# Patient Record
Sex: Female | Born: 1945
Health system: Southern US, Community
[De-identification: ages and names within clinical notes are randomized; demographics above are authoritative.]

## PROBLEM LIST (undated history)

## (undated) ENCOUNTER — Inpatient Hospital Stay: Admission: EM | Payer: Self-pay | Source: Home / Self Care

## (undated) DIAGNOSIS — T7840XA Allergy, unspecified, initial encounter: Secondary | ICD-10-CM

## (undated) DIAGNOSIS — Z86718 Personal history of other venous thrombosis and embolism: Secondary | ICD-10-CM

## (undated) DIAGNOSIS — D689 Coagulation defect, unspecified: Secondary | ICD-10-CM

## (undated) DIAGNOSIS — D6851 Activated protein C resistance: Secondary | ICD-10-CM

## (undated) DIAGNOSIS — R590 Localized enlarged lymph nodes: Secondary | ICD-10-CM

## (undated) DIAGNOSIS — I071 Rheumatic tricuspid insufficiency: Secondary | ICD-10-CM

## (undated) DIAGNOSIS — H269 Unspecified cataract: Secondary | ICD-10-CM

## (undated) DIAGNOSIS — N393 Stress incontinence (female) (male): Secondary | ICD-10-CM

## (undated) DIAGNOSIS — K635 Polyp of colon: Secondary | ICD-10-CM

## (undated) DIAGNOSIS — Z8701 Personal history of pneumonia (recurrent): Secondary | ICD-10-CM

## (undated) DIAGNOSIS — Z8781 Personal history of (healed) traumatic fracture: Secondary | ICD-10-CM

## (undated) DIAGNOSIS — R7303 Prediabetes: Secondary | ICD-10-CM

## (undated) DIAGNOSIS — R011 Cardiac murmur, unspecified: Secondary | ICD-10-CM

## (undated) DIAGNOSIS — K5790 Diverticulosis of intestine, part unspecified, without perforation or abscess without bleeding: Secondary | ICD-10-CM

## (undated) DIAGNOSIS — E785 Hyperlipidemia, unspecified: Secondary | ICD-10-CM

## (undated) DIAGNOSIS — E042 Nontoxic multinodular goiter: Secondary | ICD-10-CM

## (undated) DIAGNOSIS — E039 Hypothyroidism, unspecified: Secondary | ICD-10-CM

## (undated) DIAGNOSIS — K219 Gastro-esophageal reflux disease without esophagitis: Secondary | ICD-10-CM

## (undated) DIAGNOSIS — K5909 Other constipation: Secondary | ICD-10-CM

## (undated) DIAGNOSIS — R0600 Dyspnea, unspecified: Secondary | ICD-10-CM

## (undated) DIAGNOSIS — D649 Anemia, unspecified: Secondary | ICD-10-CM

## (undated) DIAGNOSIS — M1990 Unspecified osteoarthritis, unspecified site: Secondary | ICD-10-CM

## (undated) DIAGNOSIS — I10 Essential (primary) hypertension: Secondary | ICD-10-CM

## (undated) DIAGNOSIS — R3129 Other microscopic hematuria: Secondary | ICD-10-CM

## (undated) DIAGNOSIS — M810 Age-related osteoporosis without current pathological fracture: Secondary | ICD-10-CM

## (undated) DIAGNOSIS — N3281 Overactive bladder: Secondary | ICD-10-CM

## (undated) HISTORY — PX: JOINT REPLACEMENT: SHX530

## (undated) HISTORY — DX: Essential (primary) hypertension: I10

## (undated) HISTORY — DX: Age-related osteoporosis without current pathological fracture: M81.0

## (undated) HISTORY — PX: LATERAL EPICONDYLE RELEASE: SHX1958

## (undated) HISTORY — PX: NEUROMA SURGERY: SHX722

## (undated) HISTORY — PX: UPPER GASTROINTESTINAL ENDOSCOPY: SHX188

## (undated) HISTORY — DX: Overactive bladder: N32.81

## (undated) HISTORY — DX: Anemia, unspecified: D64.9

## (undated) HISTORY — DX: Other microscopic hematuria: R31.29

## (undated) HISTORY — PX: POLYPECTOMY: SHX149

## (undated) HISTORY — DX: Diverticulosis of intestine, part unspecified, without perforation or abscess without bleeding: K57.90

## (undated) HISTORY — DX: Activated protein C resistance: D68.51

## (undated) HISTORY — DX: Polyp of colon: K63.5

## (undated) HISTORY — PX: COLONOSCOPY: SHX174

## (undated) HISTORY — DX: Cardiac murmur, unspecified: R01.1

## (undated) HISTORY — DX: Hyperlipidemia, unspecified: E78.5

## (undated) HISTORY — PX: TONSILLECTOMY: SUR1361

## (undated) HISTORY — DX: Coagulation defect, unspecified: D68.9

## (undated) HISTORY — PX: SPINE SURGERY: SHX786

## (undated) HISTORY — DX: Unspecified cataract: H26.9

## (undated) HISTORY — DX: Rheumatic tricuspid insufficiency: I07.1

## (undated) HISTORY — DX: Nontoxic multinodular goiter: E04.2

## (undated) HISTORY — DX: Personal history of (healed) traumatic fracture: Z87.81

## (undated) HISTORY — PX: EYE SURGERY: SHX253

## (undated) HISTORY — DX: Allergy, unspecified, initial encounter: T78.40XA

---

## 1971-08-12 HISTORY — PX: OVARIAN CYST REMOVAL: SHX89

## 1971-08-12 HISTORY — PX: APPENDECTOMY: SHX54

## 1979-04-12 HISTORY — PX: NEUROMA SURGERY: SHX722

## 1998-06-05 ENCOUNTER — Other Ambulatory Visit: Admission: RE | Admit: 1998-06-05 | Discharge: 1998-06-05 | Payer: Self-pay | Admitting: Obstetrics & Gynecology

## 1999-04-12 HISTORY — PX: INCONTINENCE SURGERY: SHX676

## 1999-04-12 HISTORY — PX: TOTAL ABDOMINAL HYSTERECTOMY W/ BILATERAL SALPINGOOPHORECTOMY: SHX83

## 1999-04-25 ENCOUNTER — Emergency Department (HOSPITAL_COMMUNITY): Admission: EM | Admit: 1999-04-25 | Discharge: 1999-04-25 | Payer: Self-pay | Admitting: Emergency Medicine

## 1999-04-26 ENCOUNTER — Encounter: Payer: Self-pay | Admitting: Obstetrics & Gynecology

## 1999-04-30 ENCOUNTER — Inpatient Hospital Stay (HOSPITAL_COMMUNITY): Admission: RE | Admit: 1999-04-30 | Discharge: 1999-05-04 | Payer: Self-pay | Admitting: Obstetrics & Gynecology

## 1999-04-30 ENCOUNTER — Encounter (INDEPENDENT_AMBULATORY_CARE_PROVIDER_SITE_OTHER): Payer: Self-pay

## 1999-06-28 ENCOUNTER — Ambulatory Visit (HOSPITAL_BASED_OUTPATIENT_CLINIC_OR_DEPARTMENT_OTHER): Admission: RE | Admit: 1999-06-28 | Discharge: 1999-06-28 | Payer: Self-pay | Admitting: Urology

## 1999-07-15 ENCOUNTER — Encounter (INDEPENDENT_AMBULATORY_CARE_PROVIDER_SITE_OTHER): Payer: Self-pay | Admitting: Specialist

## 1999-07-15 ENCOUNTER — Ambulatory Visit (HOSPITAL_COMMUNITY): Admission: RE | Admit: 1999-07-15 | Discharge: 1999-07-15 | Payer: Self-pay | Admitting: Urology

## 1999-08-16 ENCOUNTER — Emergency Department (HOSPITAL_COMMUNITY): Admission: EM | Admit: 1999-08-16 | Discharge: 1999-08-16 | Payer: Self-pay | Admitting: Emergency Medicine

## 2000-02-18 ENCOUNTER — Other Ambulatory Visit: Admission: RE | Admit: 2000-02-18 | Discharge: 2000-02-18 | Payer: Self-pay | Admitting: Obstetrics & Gynecology

## 2001-03-31 ENCOUNTER — Other Ambulatory Visit: Admission: RE | Admit: 2001-03-31 | Discharge: 2001-03-31 | Payer: Self-pay | Admitting: Obstetrics & Gynecology

## 2001-08-27 ENCOUNTER — Ambulatory Visit (HOSPITAL_COMMUNITY): Admission: RE | Admit: 2001-08-27 | Discharge: 2001-08-27 | Payer: Self-pay | Admitting: *Deleted

## 2002-09-01 ENCOUNTER — Emergency Department (HOSPITAL_COMMUNITY): Admission: EM | Admit: 2002-09-01 | Discharge: 2002-09-01 | Payer: Self-pay | Admitting: Emergency Medicine

## 2002-09-02 ENCOUNTER — Encounter: Payer: Self-pay | Admitting: Emergency Medicine

## 2002-09-15 ENCOUNTER — Other Ambulatory Visit: Admission: RE | Admit: 2002-09-15 | Discharge: 2002-09-15 | Payer: Self-pay | Admitting: Obstetrics & Gynecology

## 2005-08-11 LAB — HM COLONOSCOPY: HM COLON: NORMAL

## 2005-11-17 ENCOUNTER — Encounter: Admission: RE | Admit: 2005-11-17 | Discharge: 2005-11-17 | Payer: Self-pay | Admitting: Family Medicine

## 2006-05-01 ENCOUNTER — Ambulatory Visit (HOSPITAL_COMMUNITY): Admission: RE | Admit: 2006-05-01 | Discharge: 2006-05-01 | Payer: Self-pay | Admitting: *Deleted

## 2006-11-20 ENCOUNTER — Encounter: Admission: RE | Admit: 2006-11-20 | Discharge: 2006-11-20 | Payer: Self-pay | Admitting: Family Medicine

## 2007-05-26 ENCOUNTER — Encounter: Admission: RE | Admit: 2007-05-26 | Discharge: 2007-05-26 | Payer: Self-pay | Admitting: Family Medicine

## 2007-11-23 ENCOUNTER — Ambulatory Visit (HOSPITAL_COMMUNITY): Admission: RE | Admit: 2007-11-23 | Discharge: 2007-11-23 | Payer: Self-pay | Admitting: Family Medicine

## 2008-10-30 ENCOUNTER — Encounter: Admission: RE | Admit: 2008-10-30 | Discharge: 2008-10-30 | Payer: Self-pay | Admitting: Family Medicine

## 2009-02-14 ENCOUNTER — Encounter: Admission: RE | Admit: 2009-02-14 | Discharge: 2009-02-14 | Payer: Self-pay | Admitting: Family Medicine

## 2009-10-31 ENCOUNTER — Encounter: Admission: RE | Admit: 2009-10-31 | Discharge: 2009-10-31 | Payer: Self-pay | Admitting: Family Medicine

## 2009-11-08 ENCOUNTER — Encounter: Admission: RE | Admit: 2009-11-08 | Discharge: 2009-11-08 | Payer: Self-pay | Admitting: Family Medicine

## 2009-11-08 LAB — HM DEXA SCAN

## 2010-09-01 ENCOUNTER — Encounter: Payer: Self-pay | Admitting: Family Medicine

## 2010-10-11 ENCOUNTER — Other Ambulatory Visit: Payer: Self-pay | Admitting: Family Medicine

## 2010-10-11 DIAGNOSIS — Z1231 Encounter for screening mammogram for malignant neoplasm of breast: Secondary | ICD-10-CM

## 2010-11-13 ENCOUNTER — Ambulatory Visit
Admission: RE | Admit: 2010-11-13 | Discharge: 2010-11-13 | Disposition: A | Discharge status: Home / Self Care | Payer: BC Managed Care – PPO | Source: Ambulatory Visit | Attending: Family Medicine | Admitting: Family Medicine

## 2010-11-13 DIAGNOSIS — Z1231 Encounter for screening mammogram for malignant neoplasm of breast: Secondary | ICD-10-CM

## 2010-11-21 ENCOUNTER — Other Ambulatory Visit: Payer: Self-pay | Admitting: Urology

## 2010-11-21 ENCOUNTER — Ambulatory Visit (HOSPITAL_COMMUNITY)
Admission: RE | Admit: 2010-11-21 | Discharge: 2010-11-21 | Disposition: A | Discharge status: Home / Self Care | Payer: BLUE CROSS/BLUE SHIELD | Source: Ambulatory Visit | Attending: Urology | Admitting: Urology

## 2010-11-21 ENCOUNTER — Other Ambulatory Visit (HOSPITAL_COMMUNITY): Payer: Self-pay | Admitting: Urology

## 2010-11-21 ENCOUNTER — Encounter (HOSPITAL_COMMUNITY): Payer: BLUE CROSS/BLUE SHIELD

## 2010-11-21 DIAGNOSIS — I10 Essential (primary) hypertension: Secondary | ICD-10-CM | POA: Insufficient documentation

## 2010-11-21 DIAGNOSIS — Z87891 Personal history of nicotine dependence: Secondary | ICD-10-CM | POA: Insufficient documentation

## 2010-11-21 DIAGNOSIS — Z79899 Other long term (current) drug therapy: Secondary | ICD-10-CM | POA: Insufficient documentation

## 2010-11-21 DIAGNOSIS — Z01818 Encounter for other preprocedural examination: Secondary | ICD-10-CM | POA: Insufficient documentation

## 2010-11-21 DIAGNOSIS — Z01810 Encounter for preprocedural cardiovascular examination: Secondary | ICD-10-CM | POA: Insufficient documentation

## 2010-11-21 DIAGNOSIS — N8111 Cystocele, midline: Secondary | ICD-10-CM | POA: Insufficient documentation

## 2010-11-21 DIAGNOSIS — Z01812 Encounter for preprocedural laboratory examination: Secondary | ICD-10-CM | POA: Insufficient documentation

## 2010-11-21 LAB — BASIC METABOLIC PANEL
Chloride: 102 mEq/L (ref 96–112)
GFR calc non Af Amer: >60 mL/min (ref >60)
Potassium: 4.2 mEq/L (ref 3.5–5.1)
Sodium: 143 mEq/L (ref 135–145)

## 2010-11-21 LAB — CBC
HCT: 35.1 % — ABNORMAL LOW (ref 36.0–46.0)
Hemoglobin: 11.4 g/dL — ABNORMAL LOW (ref 12.0–15.0)
MCHC: 32.5 g/dL (ref 30.0–36.0)
MCV: 97.2 fL (ref 78.0–100.0)
WBC: 6.8 10*3/uL (ref 4.0–10.5)

## 2010-11-21 LAB — PROTIME-INR: INR: 1.04 (ref 0.00–1.49)

## 2010-11-21 LAB — SURGICAL PCR SCREEN
MRSA, PCR: NEGATIVE
Staphylococcus aureus: NEGATIVE

## 2010-11-21 LAB — APTT: aPTT: 28 seconds (ref 24–37)

## 2010-12-03 ENCOUNTER — Ambulatory Visit (HOSPITAL_COMMUNITY)
Admission: RE | Admit: 2010-12-03 | Discharge: 2010-12-04 | Disposition: A | Discharge status: Home / Self Care | Payer: BLUE CROSS/BLUE SHIELD | Source: Ambulatory Visit | Attending: Urology | Admitting: Urology

## 2010-12-03 DIAGNOSIS — Z79899 Other long term (current) drug therapy: Secondary | ICD-10-CM | POA: Insufficient documentation

## 2010-12-03 DIAGNOSIS — I10 Essential (primary) hypertension: Secondary | ICD-10-CM | POA: Insufficient documentation

## 2010-12-03 DIAGNOSIS — N8111 Cystocele, midline: Secondary | ICD-10-CM | POA: Insufficient documentation

## 2010-12-03 DIAGNOSIS — Z01812 Encounter for preprocedural laboratory examination: Secondary | ICD-10-CM | POA: Insufficient documentation

## 2010-12-03 HISTORY — PX: CYSTOCELE REPAIR: SHX163

## 2010-12-03 LAB — TYPE AND SCREEN
ABO/RH(D): A POS
Antibody Screen: NEGATIVE

## 2010-12-03 LAB — BASIC METABOLIC PANEL
BUN: 14 mg/dL (ref 6–23)
CO2: 26 mEq/L (ref 19–32)
Chloride: 106 mEq/L (ref 96–112)
Glucose, Bld: 212 mg/dL — ABNORMAL HIGH (ref 70–99)
Potassium: 3.7 mEq/L (ref 3.5–5.1)

## 2010-12-04 LAB — BASIC METABOLIC PANEL
BUN: 16 mg/dL (ref 6–23)
CO2: 27 mEq/L (ref 19–32)
Chloride: 103 mEq/L (ref 96–112)
Creatinine, Ser: 0.98 mg/dL (ref 0.4–1.2)
GFR calc Af Amer: >60 mL/min (ref >60)
Glucose, Bld: 125 mg/dL — ABNORMAL HIGH (ref 70–99)

## 2010-12-04 LAB — HEMOGLOBIN AND HEMATOCRIT, BLOOD: HCT: 33.1 % — ABNORMAL LOW (ref 36.0–46.0)

## 2010-12-11 ENCOUNTER — Institutional Professional Consult (permissible substitution) (INDEPENDENT_AMBULATORY_CARE_PROVIDER_SITE_OTHER): Payer: BLUE CROSS/BLUE SHIELD | Admitting: Family Medicine

## 2010-12-11 DIAGNOSIS — I10 Essential (primary) hypertension: Secondary | ICD-10-CM

## 2010-12-11 DIAGNOSIS — E782 Mixed hyperlipidemia: Secondary | ICD-10-CM

## 2010-12-23 ENCOUNTER — Other Ambulatory Visit: Payer: Self-pay | Admitting: Family Medicine

## 2010-12-23 MED ORDER — SIMVASTATIN 40 MG PO TABS: 40.0000 mg | ORAL_TABLET | Freq: Every day | ORAL | Status: DC

## 2010-12-23 NOTE — Op Note (Signed)
  NAMEEURIKA, SANDY             ACCOUNT NO.:  [id number]  MEDICAL RECORD NO.:  [id number]           PATIENT TYPE:  O  LOCATION:  DAYL                         FACILITY:  [facility]  PHYSICIAN:  Martina Sinner, MD DATE OF BIRTH:  September 03, 1945  DATE OF PROCEDURE:  12/03/2010 DATE OF DISCHARGE:                              OPERATIVE REPORT   ASSISTANT:  Delia Chimes, NP  PREOPERATIVE DIAGNOSIS:  Cystocele.  POSTOPERATIVE DIAGNOSIS:  Cystocele repair plus cystoscopy.  INDICATIONS FOR PROCEDURE:  Ms. Jentsch has a complicated past history. She has had reconstruction of urethra.  She has a grade 2 cystocele that is symptomatic.  Her vaginal cuff was actually quite well supported. She has minimal posterior defect.  PROCEDURE IN DETAIL:  She was prepped and draped in usual fashion. Extra care was taken with leg positioning to minimize risk of getting problem of central neuropathy and DVT.  Importantly, she had a narrow vagina with a narrow pubic arch.  Her vaginal length was approximately 8 cm with a little bit of shortening.  I placed a 3-0 Vicryl at the cuff. Because of her narrow vagina and small cystocele, it made more challenging for the surgery.  I could not do a T-shaped anterior incision and did a midline incision approximately 0.5 cm from the cuff to just proximal to the urethra.  I used Allis clamps on both sides and dissected the anterior vaginal wall from the underlying pubocervical fascia.  I mobilized nearly to the white line bilaterally.  I was very pleased with the mobilization at the apex.  I had instilled 18 cc of lidocaine-epinephrine mixture prior.  She had a small bubble like cystocele.  Again, her anatomy was very small.  I was very pleased with a two-layer imbricating anterior repair with running 2-0 Vicryl on an SH needle.  I did not imbricate the bladder neck.  The repair was not under tension.  I reduced the cystoceles very well.  I cystoscoped the  patient.  Cystoscopically, there was a good reduction of her cystocele.  Ureteral orifices were in normal position.  There were excellent blue jets bilaterally.  There is no injury to bladder or urethra.  I spent many minutes after taking down my double ring retractor and decided that she did not need a graft.  The repair looked great and I did not want to take down any more lateral attachments.  She really did not have descensus laterally or apically.  I trimmed a few millimeters of anterior vaginal wall mucosa bilaterally and closed the anterior vaginal wall with running 2-0 Vicryl on CT1 needle.  One vaginal pack with Estrace cream was inserted.  Leg position was good at the end of the case.  Blue urine was draining at the end of the case.  Blood loss was less than 50 mL.  Hoping this procedure will reach her treatment goal.          ______________________________ Martina Sinner, MD     SAM/MEDQ  D:  12/03/2010  T:  12/03/2010  Job:  [id number]  Electronically Signed by Alfredo Martinez MD on 12/23/2010 01:16:13 PM

## 2010-12-27 NOTE — Op Note (Signed)
[name], Dorothy Rojas             ACCOUNT NO.:  [id number]   MEDICAL RECORD NO.:  [id number]          PATIENT TYPE:  AMB   LOCATION:  ENDO                         FACILITY:  [facility]   PHYSICIAN:  Georgiana Spinner, M.D.    DATE OF BIRTH:  05/08/1946   DATE OF PROCEDURE:  05/01/2006  DATE OF DISCHARGE:                                 OPERATIVE REPORT   PROCEDURE:  Colonoscopy.   INDICATIONS FOR PROCEDURE:  Rectal bleeding.   ANESTHESIA:  Demerol 70 mg, Versed 7 mg.   PROCEDURE:  With the patient mildly sedated in the left lateral decubitus  position, the Olympus videoscopic colonoscope was inserted to the rectum,  passed under direct vision and with pressure applied, we reached the cecum  identified by the ileocecal valve and base of the cecum.  We entered into  the terminal ileum via the ileocecal valve and photographed this, as well,  which appeared to be normal.  From this point, the colonoscope was slowly  withdrawn taking circumferential views of the colonic mucosa stopping only  in the rectum which appeared normal on direct and showed small hemorrhoidal  tissue tags on retroflexed view. The endoscope was straightened and  withdrawn.  The patient's vital signs and pulse oximeter remained stable.  The patient tolerated the procedure well without apparent complications.   FINDINGS:  Small internal hemorrhoidal tags, otherwise, unremarkable  colonoscopic examination to the cecum including the terminal ileum.   PLAN:  Have the patient follow-up with me as an outpatient.           ______________________________  Georgiana Spinner, M.D.     GMO/MEDQ  D:  05/01/2006  T:  05/03/2006  Job:  [id number]

## 2010-12-27 NOTE — Procedures (Signed)
[facility]  Patient:    Dorothy Rojas, MANIACI Visit Number: [id number] MRN: [id number]          Service Type: END Location: ENDO Attending Physician:  Sabino Gasser Dictated by:   Sabino Gasser, M.D. Proc. Date: 08/27/01 Admit Date:  08/27/2001 Discharge Date: 08/27/2001   CC:         Tammy R. Collins [place], M.D., [facility]   Procedure Report  PROCEDURE:  Colonoscopy.  INDICATION:  Colon cancer screening, constipation.  ANESTHESIA:  Demerol 70, Versed 6 mg, ampicillin 2 g, and gentamicin 60 mg.  DESCRIPTION OF PROCEDURE:  With the patient mildly sedated in the left lateral decubitus position, the Olympus videoscopic colonoscope was inserted into the rectum and passed under direct vision to the cecum, identified by the ileocecal valve and appendiceal orifice, both of which were photographed. From this point, the endoscope was slowly withdrawn, taking circumferential views of the entire colonic mucosa, stopping only then in the rectum which appeared normal on direct view and showed small hemorrhoids on retroflexed view.  The endoscope was straightened and withdrawn.  The patients vital signs and pulse oximeter remained stable.  The patient tolerated the procedure well without apparent complications.  FINDINGS:  Internal hemorrhoids, otherwise unremarkable examination.  PLAN:  Repeat examination in 5-10 years. Dictated by:   Sabino Gasser, M.D. Attending Physician:  Sabino Gasser DD:  08/27/01 TD:  08/29/01 Job: [id number]

## 2011-04-02 ENCOUNTER — Other Ambulatory Visit: Payer: Medicare Other

## 2011-04-02 DIAGNOSIS — E7800 Pure hypercholesterolemia, unspecified: Secondary | ICD-10-CM

## 2011-04-02 DIAGNOSIS — I10 Essential (primary) hypertension: Secondary | ICD-10-CM

## 2011-04-03 LAB — COMPREHENSIVE METABOLIC PANEL
Alkaline Phosphatase: 60 U/L (ref 39–117)
Glucose, Bld: 97 mg/dL (ref 70–99)
Sodium: 142 mEq/L (ref 135–145)
Total Bilirubin: 0.6 mg/dL (ref 0.3–1.2)
Total Protein: 6.9 g/dL (ref 6.0–8.3)

## 2011-04-03 LAB — LIPID PANEL
HDL: 49 mg/dL (ref >39)
Total CHOL/HDL Ratio: 2.7 Ratio
VLDL: 26 mg/dL (ref 0–40)

## 2011-04-07 ENCOUNTER — Encounter: Payer: Self-pay | Admitting: Family Medicine

## 2011-04-09 ENCOUNTER — Ambulatory Visit (INDEPENDENT_AMBULATORY_CARE_PROVIDER_SITE_OTHER): Payer: Medicare Other | Admitting: Family Medicine

## 2011-04-09 ENCOUNTER — Encounter: Payer: Self-pay | Admitting: Family Medicine

## 2011-04-09 VITALS — BP 118/68 | HR 64 | Ht 64.0 in | Wt 134.0 lb

## 2011-04-09 DIAGNOSIS — E7800 Pure hypercholesterolemia, unspecified: Secondary | ICD-10-CM

## 2011-04-09 DIAGNOSIS — Z23 Encounter for immunization: Secondary | ICD-10-CM

## 2011-04-09 DIAGNOSIS — I10 Essential (primary) hypertension: Secondary | ICD-10-CM

## 2011-04-09 DIAGNOSIS — Z0000 Encounter for general adult medical examination without abnormal findings: Secondary | ICD-10-CM

## 2011-04-09 LAB — POCT URINALYSIS DIPSTICK
Bilirubin, UA: NEGATIVE
Glucose, UA: NEGATIVE
Leukocytes, UA: NEGATIVE
Nitrite, UA: NEGATIVE

## 2011-04-09 MED ORDER — SIMVASTATIN 40 MG PO TABS: 40.0000 mg | ORAL_TABLET | Freq: Every day | ORAL | Status: DC

## 2011-04-09 MED ORDER — LISINOPRIL-HYDROCHLOROTHIAZIDE 20-12.5 MG PO TABS: 1.0000 | ORAL_TABLET | Freq: Every day | ORAL | Status: DC

## 2011-04-09 NOTE — Patient Instructions (Signed)

## 2011-04-09 NOTE — Progress Notes (Signed)
Dorothy Rojas is a 65 y.o. female who presents for a complete physical.  She has the following concerns: brother had a heart attack late June 2012. And wants a mole on her left shoulder.  F/u HTN and hyperlipidemia--had labs last week.  Per visit in May, she tried cutting BP pill in 1/2. Had recurrent swelling in her ankles, and went back up to full tablet.  Only infrequent dizziness (related to stretching after exercising)   Immunization History  Administered Date(s) Administered  . Td 08/07/2006  . Zoster 10/25/2008   Last Pap smear: can't recall.  S/p hysterectomy for benign reasons Last mammogram: 11/2009 Last colonoscopy: approximately 2008 with Dr. Virginia Rochester [facility]  [facility] Ophtho: yearly Dentist: regularly (3x/year) Exercise:  Joined [employer], and JUST started couch to 5K (got an app for her phone).  Walks 5 days/week  ROS:The patient denies anorexia, fever, weight changes, headaches,  vision changes, decreased hearing, ear pain, sore throat, breast concerns, chest pain, palpitations, dizziness, syncope, dyspnea on exertion, cough, swelling, nausea, vomiting, diarrhea, constipation, abdominal pain, melena, hematochezia, hematuria, dysuria, vaginal bleeding, discharge, odor or itch, genital lesions, joint pains, numbness, tingling, weakness, tremor, suspicious skin lesions, depression, anxiety, abnormal bleeding/bruising, or enlarged lymph nodes.  Bad episode of heartburn while in [place], no other problems since.  Occasional L knee pain with playing tennis.  No further pelvic pain and bulging since bladder surgery in the Spring, but is now having problems with urinary incontinence, constant leaking.  Plans to have treatment at urologist (nerve stimulation through tibial nerve)  PHYSICAL EXAM: BP 118/68  Pulse 64  Ht 5\' 4"  (1.626 m)  Wt 134 lb (60.782 kg)  BMI 23.00 kg/m2  General Appearance:    Alert, cooperative, no distress, appears stated age    Head:    Normocephalic, without obvious abnormality, atraumatic  Eyes:    PERRL, conjunctiva/corneas clear, EOM's intact, fundi    benign  Ears:    Normal TM's and external ear canals  Nose:   Nares normal, mucosa normal, no drainage or sinus   tenderness  Throat:   Lips, mucosa, and tongue normal; teeth and gums normal  Neck:   Supple, no lymphadenopathy;  thyroid:  no   enlargement/tenderness/nodules; no carotid   bruit or JVD  Back:    Spine nontender, no curvature, ROM normal, no CVA     tenderness  Lungs:     Clear to auscultation bilaterally without wheezes, rales or     ronchi; respirations unlabored  Chest Wall:    No tenderness or deformity   Heart:    Regular rate and rhythm, S1 and S2 normal, no murmur, rub   or gallop  Breast Exam:    No tenderness, masses, or nipple discharge or inversion.      No axillary lymphadenopathy  Abdomen:     Soft, non-tender, nondistended, normoactive bowel sounds,    no masses, no hepatosplenomegaly  Genitalia:    Normal external genitalia without lesions.  BUS and vagina normal; Uterus and adnexa surgically absent.  No masses palpable  Rectal:    Normal tone, no masses or tenderness; guaiac negative stool  Extremities:   No clubbing, cyanosis or edema  Pulses:   2+ and symmetric all extremities  Skin:   Skin color, texture, turgor normal, no rashes or suspicious lesions. Benign lesion L shoulder.  Lymph nodes:   Cervical, supraclavicular, and axillary nodes normal  Neurologic:   CNII-XII intact, normal strength, sensation  and gait; reflexes 2+ and symmetric throughout          Psych:   Normal mood, affect, hygiene and grooming.   See lab results--normal c-met and FLP  ASSESSMENT/PLAN: 1. Routine general medical examination at a health care facility  Visual acuity screening, POCT Urinalysis Dipstick  2. Need for prophylactic vaccination and inoculation against influenza  Flu vaccine greater than or equal to 3yo preservative free IM  3. Need for  pneumococcal vaccination  Pneumococcal polysaccharide vaccine 23-valent greater than or equal to 2yo subcutaneous/IM  4. Pure hypercholesterolemia  simvastatin (ZOCOR) 40 MG tablet   well controlled  5. Essential hypertension, benign  lisinopril-hydrochlorothiazide (PRINZIDE,ZESTORETIC) 20-12.5 MG per tablet   well controlled   Discussed monthly self breast exams and yearly mammograms after the age of 29; at least 30 minutes of aerobic activity at least 5 days/week; proper sunscreen use reviewed; healthy diet, including goals of calcium and vitamin D intake and alcohol recommendations (less than or equal to 1 drink/day) reviewed; regular seatbelt use; changing batteries in smoke detectors.  Immunization recommendations discussed--flu and pneumovax given today.  Colonoscopy recommendations reviewed--UTD. Hemoccult cards given

## 2011-06-16 ENCOUNTER — Other Ambulatory Visit (INDEPENDENT_AMBULATORY_CARE_PROVIDER_SITE_OTHER): Payer: Medicare Other

## 2011-06-16 DIAGNOSIS — Z0000 Encounter for general adult medical examination without abnormal findings: Secondary | ICD-10-CM

## 2011-06-16 DIAGNOSIS — Z1211 Encounter for screening for malignant neoplasm of colon: Secondary | ICD-10-CM

## 2011-06-16 LAB — HEMOCCULT GUIAC POC 1CARD (OFFICE)
Card #2 Fecal Occult Blod, POC: NEGATIVE
Card #3 Fecal Occult Blood, POC: NEGATIVE

## 2011-06-17 ENCOUNTER — Encounter: Payer: Self-pay | Admitting: Family Medicine

## 2011-07-07 ENCOUNTER — Encounter: Payer: Self-pay | Admitting: Family Medicine

## 2011-07-07 ENCOUNTER — Ambulatory Visit (INDEPENDENT_AMBULATORY_CARE_PROVIDER_SITE_OTHER): Payer: Medicare Other | Admitting: Family Medicine

## 2011-07-07 VITALS — BP 122/70 | HR 68 | Temp 98.1°F | Ht 64.0 in | Wt 130.0 lb

## 2011-07-07 DIAGNOSIS — H8110 Benign paroxysmal vertigo, unspecified ear: Secondary | ICD-10-CM

## 2011-07-07 NOTE — Patient Instructions (Signed)
Meclizine 12.5 to 25 mg every 6-8 hours, if needed for vertigo.  This is found in OTC agents such as Bonine, or less drowsy formulation of Dramamine.  This may cause drowsiness.  Referring you to Physical Therapy for manuevers to help treat this problem.  Benign Positional Vertigo Vertigo is a feeling that you are unsteady or that you or your surroundings are moving. Benign positional vertigo (BPV) is the most common form of vertigo. Benign means it does not have a serious cause. BPV is an upset in the balance system in your middle ear. This is troublesome but usually not serious. A viral infection or head injury are common causes, but often no cause is found. It is more common as we grow older. SYMPTOMS  Sudden dizziness happens when you move your head in different directions. Some of the problems that come with this are:  Loss of balance.   Throwing up (vomiting).   Blurred vision.   Dizziness .   Feeling sick to your stomach (nauseated).  DIAGNOSIS  Your caregiver may do some specialized testing to prove what is wrong. HOME CARE INSTRUCTIONS   Rest and eat a well-balanced diet.   Move slowly and do not make sudden body or head movements.   Do not drive a car or do any activities that could hurt you or others.   Lie down and rest. Take precautions to prevent falls.  SEEK IMMEDIATE MEDICAL CARE IF:   You develop headaches which are severe or lasting.   You develop continued vomiting.   A temporary loss or change of vision appears.   You notice temporary numbness on one side of your body.   You are temporarily unable to speak.   Temporary areas of weakness develop.   You have weakness or numbness in the face, arms, or legs.   You notice dizziness or difficulty walking.   You experience slurred speech or difficulty swallowing.  MAKE SURE YOU:   Understand these instructions.   Will watch your condition.   Will get help right away if you are not doing well or get  worse.  Document Released: 05/05/2006 Document Revised: 02/10/2011 Document Reviewed: 05/14/2006 [facility] Patient Information 2012 [place], [place].

## 2011-07-07 NOTE — Progress Notes (Signed)
Chief complaint:  dizzy, off balance, nausea and sweats x 4-6 weeks  HPI:  Has had some intermittent vertigo sometimes when she gets up in the middle of the night (which occurs about 2-3x/week) ongoing since about August.  4-5 weeks ago she started having dizziness when she wakes up in the morning also.  Feels off-balanced, staggering when she walks.  Lasts for about 5 minutes.   Only rarely has symptoms at other times during the day, usually just symptomatic when she wakes up.  Had a few instances where the dizziness was associated with nausea, including this morning.  This morning also felt somewhat clammy and flushed during the episode of dizziness, which is why she made the appointment.  Past Medical History  Diagnosis Date  . Hypertension   . Elevated cholesterol   . Osteopenia     last DEXA 10/2009  . Former smoker     Quit in 1984  . Mixed stress and urge urinary incontinence     Dr. McDiarmid    Past Surgical History  Procedure Date  . Tonsillectomy   . Total abdominal hysterectomy w/ bilateral salpingoophorectomy 04/1999  . Appendectomy 1973  . Ovarian cyst removal 1973    LEFT OVARIAN  . Incontinence surgery 04/1999    Re-do at [facility] for sling complications in 2001  . Neuroma surgery 1980's    bilateral feet  . Lateral epicondyle release     Right  . Cystocele repair 11/2010    Dr. McDiarmid    History   Social History  . Marital Status: Married    Spouse Name: N/A    Number of Children: N/A  . Years of Education: N/A   Occupational History  . Not on file.   Social History Main Topics  . Smoking status: Former Smoker    Quit date: 08/11/1985  . Smokeless tobacco: Never Used  . Alcohol Use: Yes     glass of wine 3 times per week and a cocktail on the weekend.  . Drug Use: No  . Sexually Active: Not on file   Other Topics Concern  . Not on file   Social History Narrative  . No narrative on file    Family History  Problem Relation Age of Onset  .  Hypertension Father   . Heart disease Father 32    CABG, died from MI at 23  . COPD Father   . Cirrhosis Mother     related to psoriasis medications  . Diabetes Mother   . Osteogenesis imperfecta Sister   . Heart disease Sister     CHF  . Diabetes Brother   . Heart disease Brother 40    MI   Current Outpatient Prescriptions on File Prior to Visit  Medication Sig Dispense Refill  . lisinopril-hydrochlorothiazide (PRINZIDE,ZESTORETIC) 20-12.5 MG per tablet Take 1 tablet by mouth daily.  90 tablet  1  . simvastatin (ZOCOR) 40 MG tablet Take 1 tablet (40 mg total) by mouth at bedtime.  90 tablet  1   Allergies  Allergen Reactions  . Naprosyn (Naproxen) Rash   ROS:  Denies any congestion, sneezing or other allergy symptoms.  Denies fevers.  When she looks up (into her tall husband's eyes) she also feels some dizziness.  No vomiting (+nausea), diarrhea, or other GI complaints.  Denies headache, light headedness or other neuro symptoms  PHYSICAL EXAM: BP 122/70  Pulse 68  Temp(Src) 98.1 F (36.7 C) (Oral)  Ht 5\' 4"  (1.626 m)  Wt  130 lb (58.968 kg)  BMI 22.31 kg/m2 Well developed, pleasant female in no distress HEENT:  PERRL, EOMI, conjunctiva clear.  TM's and EAC's normal.  OP normal, moist mucus membranes Neck: no lymphadenopathy or thyromegaly or mass Heart: regular rate and rhythm without murmur Lungs: clear bilaterally Neuro: alert and oriented.  Cranial Nerves 2-12 intact.  Normal strength, sensation.  Normal finger-to-nose, rapid alternating movements.  DTR's 2+ and symmetric. Nystagmus and vertigo occurred while looking to the right and lying down, as well as recurred when she sat up and was looking to the right.  Symptoms abated after about 15-20 seconds.  No symptoms or nystagmus with other position changes tested  ASSESSMENT/PLAN: 1. Benign positional vertigo  Ambulatory referral to Physical Therapy   Refer to PT Meclizine prn--side effects reviewed F/u if  persistent/worsening symptoms

## 2011-07-14 ENCOUNTER — Ambulatory Visit: Payer: Medicare Other | Attending: Family Medicine | Admitting: Physical Therapy

## 2011-07-14 ENCOUNTER — Ambulatory Visit: Payer: Medicare Other | Admitting: Physical Therapy

## 2011-07-14 DIAGNOSIS — H8110 Benign paroxysmal vertigo, unspecified ear: Secondary | ICD-10-CM | POA: Insufficient documentation

## 2011-07-14 DIAGNOSIS — R269 Unspecified abnormalities of gait and mobility: Secondary | ICD-10-CM | POA: Insufficient documentation

## 2011-07-14 DIAGNOSIS — IMO0001 Reserved for inherently not codable concepts without codable children: Secondary | ICD-10-CM | POA: Insufficient documentation

## 2011-07-22 ENCOUNTER — Encounter: Payer: Medicare Other | Admitting: Physical Therapy

## 2011-07-28 ENCOUNTER — Ambulatory Visit: Payer: Medicare Other | Admitting: Physical Therapy

## 2011-07-29 ENCOUNTER — Encounter: Payer: Medicare Other | Admitting: Physical Therapy

## 2011-08-15 ENCOUNTER — Ambulatory Visit: Payer: Medicare Other | Attending: Family Medicine | Admitting: Physical Therapy

## 2011-08-15 DIAGNOSIS — H8110 Benign paroxysmal vertigo, unspecified ear: Secondary | ICD-10-CM | POA: Insufficient documentation

## 2011-08-15 DIAGNOSIS — IMO0001 Reserved for inherently not codable concepts without codable children: Secondary | ICD-10-CM | POA: Insufficient documentation

## 2011-08-15 DIAGNOSIS — R269 Unspecified abnormalities of gait and mobility: Secondary | ICD-10-CM | POA: Diagnosis not present

## 2011-08-19 DIAGNOSIS — N3941 Urge incontinence: Secondary | ICD-10-CM | POA: Diagnosis not present

## 2011-08-19 DIAGNOSIS — R350 Frequency of micturition: Secondary | ICD-10-CM | POA: Diagnosis not present

## 2011-08-21 ENCOUNTER — Ambulatory Visit: Payer: Medicare Other | Admitting: Physical Therapy

## 2011-09-09 DIAGNOSIS — R350 Frequency of micturition: Secondary | ICD-10-CM | POA: Diagnosis not present

## 2011-09-29 DIAGNOSIS — D1039 Benign neoplasm of other parts of mouth: Secondary | ICD-10-CM | POA: Diagnosis not present

## 2011-09-30 DIAGNOSIS — R350 Frequency of micturition: Secondary | ICD-10-CM | POA: Diagnosis not present

## 2011-09-30 DIAGNOSIS — N3941 Urge incontinence: Secondary | ICD-10-CM | POA: Diagnosis not present

## 2011-10-07 ENCOUNTER — Other Ambulatory Visit: Payer: Self-pay | Admitting: Family Medicine

## 2011-10-07 ENCOUNTER — Encounter: Payer: Self-pay | Admitting: Internal Medicine

## 2011-10-07 NOTE — Telephone Encounter (Signed)
PATIENT NEEDS TO SCHEDULE A OFFICE VISIT.

## 2011-10-10 LAB — HM MAMMOGRAPHY: HM Mammogram: NEGATIVE

## 2011-10-13 ENCOUNTER — Ambulatory Visit (INDEPENDENT_AMBULATORY_CARE_PROVIDER_SITE_OTHER): Payer: Medicare Other | Admitting: Family Medicine

## 2011-10-13 ENCOUNTER — Encounter: Payer: Self-pay | Admitting: Family Medicine

## 2011-10-13 DIAGNOSIS — I10 Essential (primary) hypertension: Secondary | ICD-10-CM | POA: Diagnosis not present

## 2011-10-13 DIAGNOSIS — E559 Vitamin D deficiency, unspecified: Secondary | ICD-10-CM | POA: Diagnosis not present

## 2011-10-13 DIAGNOSIS — R5383 Other fatigue: Secondary | ICD-10-CM

## 2011-10-13 DIAGNOSIS — R5381 Other malaise: Secondary | ICD-10-CM | POA: Diagnosis not present

## 2011-10-13 DIAGNOSIS — E7800 Pure hypercholesterolemia, unspecified: Secondary | ICD-10-CM | POA: Diagnosis not present

## 2011-10-13 LAB — COMPREHENSIVE METABOLIC PANEL
Albumin: 4.3 g/dL (ref 3.5–5.2)
BUN: 12 mg/dL (ref 6–23)
CO2: 29 mEq/L (ref 19–32)
Calcium: 9.8 mg/dL (ref 8.4–10.5)
Chloride: 104 mEq/L (ref 96–112)
Glucose, Bld: 97 mg/dL (ref 70–99)
Potassium: 4.6 mEq/L (ref 3.5–5.3)
Total Protein: 7.1 g/dL (ref 6.0–8.3)

## 2011-10-13 LAB — CBC WITH DIFFERENTIAL/PLATELET
Basophils Absolute: 0 10*3/uL (ref 0.0–0.1)
Basophils Relative: 0 % (ref 0–1)
Eosinophils Relative: 3 % (ref 0–5)
Lymphocytes Relative: 43 % (ref 12–46)
MCHC: 31.6 g/dL (ref 30.0–36.0)
MCV: 101.3 fL — ABNORMAL HIGH (ref 78.0–100.0)
Neutro Abs: 2.7 10*3/uL (ref 1.7–7.7)
Platelets: 239 10*3/uL (ref 150–400)
RDW: 13.2 % (ref 11.5–15.5)
WBC: 5.8 10*3/uL (ref 4.0–10.5)

## 2011-10-13 LAB — TSH: TSH: 3.707 u[IU]/mL (ref 0.350–4.500)

## 2011-10-13 LAB — LIPID PANEL: Cholesterol: 137 mg/dL (ref 0–200)

## 2011-10-13 MED ORDER — LISINOPRIL-HYDROCHLOROTHIAZIDE 20-12.5 MG PO TABS: 1.0000 | ORAL_TABLET | Freq: Every day | ORAL | Status: DC

## 2011-10-13 MED ORDER — SIMVASTATIN 40 MG PO TABS: 40.0000 mg | ORAL_TABLET | Freq: Every day | ORAL | Status: DC

## 2011-10-13 NOTE — Patient Instructions (Signed)
Keep up the good work! Make sure you are staying adequately hydrated to avoid muscle cramps.  See you in a year, sooner if you are having problems, or if labs are abnormal and need to be repeated sooner

## 2011-10-13 NOTE — Progress Notes (Signed)
Patient presents for fasting med check.  Hypertension follow-up:  Blood pressures elsewhere are 120-130's/60's-70's.  Rarely in 140's systolic.  Denies dizziness, headaches, chest pain.  Denies side effects of medications.  Hyperlipidemia follow-up:  Patient is reportedly following a low-fat, low cholesterol diet.  Compliant with medications and denies medication side effects  Urinary incontinence:  Followed by Dr. McDiarmid.  She was started on Mirabegron a couple of months ago, in addition to accupuncture and has noted some improvement.  Decreased urinary frequency, but still having leakage.  Tried the 50mg , but decreased volume of urine too much, so back to 25 mg.  Birdie Hopes was last seen in November for vertigo.  She had physical therapy, symptoms resolved.  Recently had mild recurrence, but resolved on its own.  Past Medical History  Diagnosis Date  . Hypertension   . Elevated cholesterol   . Osteopenia     last DEXA 10/2009  . Former smoker     Quit in 1984  . Mixed stress and urge urinary incontinence     Dr. McDiarmid    Past Surgical History  Procedure Date  . Tonsillectomy   . Total abdominal hysterectomy w/ bilateral salpingoophorectomy 04/1999  . Appendectomy 1973  . Ovarian cyst removal 1973    LEFT OVARIAN  . Incontinence surgery 04/1999    Re-do at [facility] for sling complications in 2001  . Neuroma surgery 1980's    bilateral feet  . Lateral epicondyle release     Right  . Cystocele repair 11/2010    Dr. McDiarmid    History   Social History  . Marital Status: Married    Spouse Name: N/A    Number of Children: N/A  . Years of Education: N/A   Occupational History  . Not on file.   Social History Main Topics  . Smoking status: Former Smoker    Quit date: 08/11/1985  . Smokeless tobacco: Never Used  . Alcohol Use: Yes     glass of wine 3 times per week and a cocktail on the weekend.  . Drug Use: No  . Sexually Active: Not on file   Other Topics  Concern  . Not on file   Social History Narrative  . No narrative on file    Family History  Problem Relation Age of Onset  . Hypertension Father   . Heart disease Father 26    CABG, died from MI at 58  . COPD Father   . Cirrhosis Mother     related to psoriasis medications  . Diabetes Mother   . Osteogenesis imperfecta Sister   . Heart disease Sister     CHF  . Diabetes Brother   . Heart disease Brother 72    MI   Current Outpatient Prescriptions on File Prior to Visit  Medication Sig Dispense Refill  . Calcium Carbonate-Vitamin D (CALCIUM + D) 600-200 MG-UNIT TABS Take 1 tablet by mouth daily.        [name] lisinopril-hydrochlorothiazide (PRINZIDE,ZESTORETIC) 20-12.5 MG per tablet Take 1 tablet by mouth daily.  90 tablet  1  . simvastatin (ZOCOR) 40 MG tablet TAKE  ONE TABLET BY MOUTH NIGHTLY AT BEDTIME  30 tablet  0  Mirabegron ER 25mg  daily  Allergies  Allergen Reactions  . Naprosyn (Naproxen) Rash   ROS: Had leg cramps after she started new bladder med, but these resolved.  Denies headaches, dizziness, chest pain, palpitations, leg swelling, dysuria.  +incontinence.  Denies nausea, vomiting, diarrhea  PHYSICAL EXAM: BP  120/70  Pulse 60  Temp(Src) 98 F (36.7 C) (Oral)  Resp 16  Ht 5\' 4"  (1.626 m)  Wt 133 lb (60.328 kg)  BMI 22.83 kg/m2 Well developed, pleasant female in no distress HEENT:  PERRL, EOMI, conjunctiva clear. TM's and EAC's normal.  OP clear Neck: no lymphadenopathy, thyromegaly or carotid bruit Heart: regular rate and rhythm without murmur Lungs: clear bilaterally Back: no spine or CVA tenderness Neuro: alert and oriented, normal gait, CN grossly intact Psych: normal mood, affect, hygiene and grooming  ASSESSMENT/PLAN: 1. Essential hypertension, benign  Comprehensive metabolic panel, lisinopril-hydrochlorothiazide (PRINZIDE,ZESTORETIC) 20-12.5 MG per tablet  2. Pure hypercholesterolemia  Lipid panel, Comprehensive metabolic panel, simvastatin  (ZOCOR) 40 MG tablet  3. Essential hypertension, benign  Comprehensive metabolic panel, lisinopril-hydrochlorothiazide (PRINZIDE,ZESTORETIC) 20-12.5 MG per tablet   well controlled  4. Unspecified vitamin D deficiency  Vitamin D 25 hydroxy  5. Other malaise and fatigue  TSH, CBC with Differential   Labs today. meds refilled x 6 months, but actually doesn't need to return for 1 year if labs normal (can call for refills in 6 months)  F/u 1 year for CPE

## 2011-10-14 ENCOUNTER — Encounter: Payer: Self-pay | Admitting: Family Medicine

## 2011-10-14 ENCOUNTER — Other Ambulatory Visit: Payer: Self-pay | Admitting: Family Medicine

## 2011-10-14 DIAGNOSIS — Z1231 Encounter for screening mammogram for malignant neoplasm of breast: Secondary | ICD-10-CM

## 2011-10-14 LAB — HM MAMMOGRAPHY: HM Mammogram: NEGATIVE

## 2011-10-21 DIAGNOSIS — N3941 Urge incontinence: Secondary | ICD-10-CM | POA: Diagnosis not present

## 2011-11-11 DIAGNOSIS — R350 Frequency of micturition: Secondary | ICD-10-CM | POA: Diagnosis not present

## 2011-11-14 ENCOUNTER — Ambulatory Visit (HOSPITAL_COMMUNITY)
Admission: RE | Admit: 2011-11-14 | Discharge: 2011-11-14 | Disposition: A | Discharge status: Home / Self Care | Payer: Medicare Other | Source: Ambulatory Visit | Attending: Family Medicine | Admitting: Family Medicine

## 2011-11-14 DIAGNOSIS — Z1231 Encounter for screening mammogram for malignant neoplasm of breast: Secondary | ICD-10-CM | POA: Diagnosis not present

## 2011-12-02 DIAGNOSIS — N3941 Urge incontinence: Secondary | ICD-10-CM | POA: Diagnosis not present

## 2011-12-16 ENCOUNTER — Encounter: Payer: Self-pay | Admitting: Medical

## 2011-12-16 ENCOUNTER — Ambulatory Visit (INDEPENDENT_AMBULATORY_CARE_PROVIDER_SITE_OTHER): Payer: Medicare Other | Admitting: Medical

## 2011-12-16 DIAGNOSIS — R300 Dysuria: Secondary | ICD-10-CM | POA: Diagnosis not present

## 2011-12-16 DIAGNOSIS — M25519 Pain in unspecified shoulder: Secondary | ICD-10-CM | POA: Diagnosis not present

## 2011-12-16 DIAGNOSIS — R079 Chest pain, unspecified: Secondary | ICD-10-CM | POA: Diagnosis not present

## 2011-12-16 DIAGNOSIS — M25511 Pain in right shoulder: Secondary | ICD-10-CM

## 2011-12-16 LAB — POCT URINALYSIS DIPSTICK
Bilirubin, UA: NEGATIVE
Glucose, UA: NEGATIVE
Nitrite, UA: NEGATIVE
Urobilinogen, UA: NEGATIVE
pH, UA: 5

## 2011-12-16 NOTE — Progress Notes (Signed)
Subjective:   HPI  Dorothy Rojas is a 66 y.o. female who presents with right sided chest pain that begin 2 days ago.  It is a twinge of pain like tightening and letting go, 5-6/10 pain.  Happens frequently throughout the day.  Not worse with deep breathing.  Not sure if this is a palpitation of heart or not.  Comes at random, not particularly with activity or rest.  Using nothing for it.  Today had right shoulder pain on the way here 4-5 /10 pain.  Compliant with her BP and cholesterol medications.  Denies syncope, GERD problems, heartburn, pain radiating to arm or jaw, no numbness, sweats, nausea, and no hx/o heart problems.    She also has been having some urinary frequency and slight discomfort.  Used an OTC Urinalysis tests that showed bacteria.  No other aggravating or relieving factors.    No other c/o.  The following portions of the patient's history were reviewed and updated as appropriate: allergies, current medications, past family history, past medical history, past social history, past surgical history and problem list.  Past Medical History  Diagnosis Date  . Hypertension   . Elevated cholesterol   . Osteopenia     last DEXA 10/2009  . Former smoker     Quit in 1984  . Mixed stress and urge urinary incontinence     Dr. McDiarmid    Allergies  Allergen Reactions  . Naprosyn (Naproxen) Rash   Family History  Problem Relation Age of Onset  . Hypertension Father   . Heart disease Father 55    CABG, died from MI at 51  . COPD Father   . Cirrhosis Mother     related to psoriasis medications  . Diabetes Mother   . Osteogenesis imperfecta Sister   . Heart disease Sister     CHF  . Diabetes Brother   . Heart disease Brother 86    MI   Review of Systems ROS reviewed and was negative other than noted in HPI or above.    Objective:   Physical Exam  General appearance: alert, no distress, WD/WN Oral cavity: MMM, no lesions Neck: supple, no lymphadenopathy, no  thyromegaly, no masses Heart: RRR, normal S1, S2, no murmurs Lungs: CTA bilaterally, no wheezes, rhonchi, or rales Abdomen: +bs, soft, mild generalized tenderness, non distended, no masses, no hepatomegaly, no splenomegaly Pulses: 2+ symmetric, upper and lower extremities, normal cap refill   Adult ECG Report  Indication: chest pain  Rate: 69bpm  Rhythm: normal sinus rhythm  QRS Axis: 58 degrees  PR Interval:  QRS Duration: 86ms  QTc:  Conduction Disturbances: none  Other Abnormalities: none  Patient's cardiac risk factors are: advanced age (older than 11 for men, 53 for women), dyslipidemia, family history of premature cardiovascular disease and hypertension.  EKG comparison: 2012 EKG, unchanged, no acute changes  Narrative Interpretation: no acute changes, normal EKG   Assessment and Plan :     Encounter Diagnoses  Name Primary?  . Chest pain Yes  . Shoulder pain, right   . Dysuria    Chest pain - discussed possible etiologies.  Reviewed EKG compared to prior in chart.  Advised that symptom suggest musculoskeletal chest wall pain.  Reassured that symptoms don't suggest other eetiologiessuch as cardiac or gastric.   Advised though if worsening ssymptomsto cal or rreturn  Shoulder pain - seems soreness of chest wall radiating to shoulder, musculoskeletal pain.  Advised relative rest, Ibuprofen prn.  Dysuria - urine culture sent.

## 2011-12-19 ENCOUNTER — Other Ambulatory Visit: Payer: Self-pay | Admitting: Medical

## 2011-12-19 LAB — URINE CULTURE: Colony Count: >=100000

## 2011-12-19 MED ORDER — NITROFURANTOIN MONOHYD MACRO 100 MG PO CAPS: 100.0000 mg | ORAL_CAPSULE | Freq: Two times a day (BID) | ORAL | Status: AC

## 2011-12-23 DIAGNOSIS — R350 Frequency of micturition: Secondary | ICD-10-CM | POA: Diagnosis not present

## 2012-01-13 DIAGNOSIS — R350 Frequency of micturition: Secondary | ICD-10-CM | POA: Diagnosis not present

## 2012-01-21 DIAGNOSIS — D239 Other benign neoplasm of skin, unspecified: Secondary | ICD-10-CM | POA: Diagnosis not present

## 2012-01-21 DIAGNOSIS — L821 Other seborrheic keratosis: Secondary | ICD-10-CM | POA: Diagnosis not present

## 2012-02-03 DIAGNOSIS — N3941 Urge incontinence: Secondary | ICD-10-CM | POA: Diagnosis not present

## 2012-02-05 DIAGNOSIS — H2510 Age-related nuclear cataract, unspecified eye: Secondary | ICD-10-CM | POA: Diagnosis not present

## 2012-02-05 DIAGNOSIS — H26019 Infantile and juvenile cortical, lamellar, or zonular cataract, unspecified eye: Secondary | ICD-10-CM | POA: Diagnosis not present

## 2012-02-05 DIAGNOSIS — H524 Presbyopia: Secondary | ICD-10-CM | POA: Diagnosis not present

## 2012-02-24 DIAGNOSIS — R350 Frequency of micturition: Secondary | ICD-10-CM | POA: Diagnosis not present

## 2012-03-15 ENCOUNTER — Ambulatory Visit (INDEPENDENT_AMBULATORY_CARE_PROVIDER_SITE_OTHER): Payer: Medicare Other | Admitting: Family Medicine

## 2012-03-15 ENCOUNTER — Encounter: Payer: Self-pay | Admitting: Family Medicine

## 2012-03-15 VITALS — BP 138/78 | HR 72 | Ht 64.0 in | Wt 134.0 lb

## 2012-03-15 DIAGNOSIS — I10 Essential (primary) hypertension: Secondary | ICD-10-CM

## 2012-03-15 DIAGNOSIS — E7800 Pure hypercholesterolemia, unspecified: Secondary | ICD-10-CM

## 2012-03-15 DIAGNOSIS — M940 Chondrocostal junction syndrome [Tietze]: Secondary | ICD-10-CM

## 2012-03-15 DIAGNOSIS — M542 Cervicalgia: Secondary | ICD-10-CM | POA: Diagnosis not present

## 2012-03-15 DIAGNOSIS — I669 Occlusion and stenosis of unspecified cerebral artery: Secondary | ICD-10-CM | POA: Diagnosis not present

## 2012-03-15 DIAGNOSIS — R9389 Abnormal findings on diagnostic imaging of other specified body structures: Secondary | ICD-10-CM | POA: Diagnosis not present

## 2012-03-15 MED ORDER — SIMVASTATIN 40 MG PO TABS: 40.0000 mg | ORAL_TABLET | Freq: Every day | ORAL | Status: DC

## 2012-03-15 MED ORDER — LISINOPRIL-HYDROCHLOROTHIAZIDE 20-12.5 MG PO TABS: 1.0000 | ORAL_TABLET | Freq: Every day | ORAL | Status: DC

## 2012-03-15 NOTE — Progress Notes (Signed)
Chief Complaint  Patient presents with  . Advice Only    woke up about 1 month ago with a tightness in her throat, then tightness down right side of her neck. Has also been exeriencing some "twingy" pains in the right side of her chest that come and go. Been feeling a lot of exhaustion in the afternoons. Had screening done few years ago through [facility]- report showed mild to moderate blockage of the right carotid.   HPI: During the night woke frequently with a tight feeling in the center of her throat, about a month ago.  Shortly after, developed a tightness down the right side of her neck.  Both resolved, but since then, sporadically she will get some twinges of pain on the right side of her chest.  Pain is described as twinges of pain, shortlived, squeezing pain.  Doesn't get any pain with exercise.  Sometimes gets pain with deep breaths.  Denies sniffles, sneeze, runny nose or sore throat.  She reports that her voice sounds a little raspy since starting the new bladder medication.  Denies cough, shortness of breath. Her brother had a heart attack about a year ago, and the only pain he had had was in the center of his neck/throat, just like the pain she had last month.  Has had no further problems with neck/throat pain since they resolved last month.  Sometimes she will also have pain underneath her right shoulder blade.  Denies abdominal pain [facility] Screening showed mild-mod blockage of her carotids and is wondering if that needed to be rechecked.  Chart was reviewed--she was here in May with similar complaints of R sided chest pain, felt to be musculoskeletal.  EKG was reviewed, normal, and reportedly unchanged from prior EKG.  Past Medical History  Diagnosis Date  . Hypertension   . Elevated cholesterol   . Osteopenia     last DEXA 10/2009  . Former smoker     Quit in 1984  . Mixed stress and urge urinary incontinence     Dr. McDiarmid   Past Surgical History  Procedure Date  .  Tonsillectomy   . Total abdominal hysterectomy w/ bilateral salpingoophorectomy 04/1999  . Appendectomy 1973  . Ovarian cyst removal 1973    LEFT OVARIAN  . Incontinence surgery 04/1999    Re-do at [facility] for sling complications in 2001  . Neuroma surgery 1980's    bilateral feet  . Lateral epicondyle release     Right  . Cystocele repair 11/2010    Dr. McDiarmid   History   Social History  . Marital Status: Married    Spouse Name: N/A    Number of Children: N/A  . Years of Education: N/A   Occupational History  . Not on file.   Social History Main Topics  . Smoking status: Former Smoker    Quit date: 08/11/1985  . Smokeless tobacco: Never Used  . Alcohol Use: Yes     glass of wine 3 times per week and a cocktail on the weekend.  . Drug Use: No  . Sexually Active: Not on file   Other Topics Concern  . Not on file   Social History Narrative  . No narrative on file    Current outpatient prescriptions:Calcium Carbonate-Vitamin D (CALCIUM + D) 600-200 MG-UNIT TABS, Take 1 tablet by mouth daily.  , Disp: , Rfl: ;  fesoterodine (TOVIAZ) 4 MG TB24, Take 4 mg by mouth daily., Disp: , Rfl: ;  lisinopril-hydrochlorothiazide (PRINZIDE,ZESTORETIC) 20-12.5 MG  per tablet, Take 1 tablet by mouth daily., Disp: 90 tablet, Rfl: 1;  mirabegron ER (MYRBETRIQ) 50 MG TB24, Take 50 mg by mouth daily., Disp: , Rfl:  simvastatin (ZOCOR) 40 MG tablet, Take 1 tablet (40 mg total) by mouth at bedtime., Disp: 90 tablet, Rfl: 1  Allergies  Allergen Reactions  . Naprosyn (Naproxen) Rash   ROS: Denies fevers, URI symptoms, shortness of breath, nausea, vomiting, heartburn, skin rash.  Urinary symptoms with some improvement on current medical regimen (and ongoing treatments in office q3 weeks).  PHYSICAL EXAM: BP 138/78  Pulse 72  Ht 5\' 4"  (1.626 m)  Wt 134 lb (60.782 kg)  BMI 23.00 kg/m2 Pleasant female in no distress Neck: nontender, no lymphadenopathy, thyromegaly or mass.  No carotid  bruit Heart: regular rate and rhythm without murmur Lungs: clear bilaterally Abdomen: soft, nontender Chest: tender at costochondral junction on R at 2 levels at level of breast Skin: no rash  ASSESSMENT/PLAN: 1. Essential hypertension, benign  lisinopril-hydrochlorothiazide (PRINZIDE,ZESTORETIC) 20-12.5 MG per tablet  2. Pure hypercholesterolemia  simvastatin (ZOCOR) 40 MG tablet  3. Abnormal carotid ultrasound    4. Neck pain    5. Costochondritis    6. Unspecified cerebral artery occlusion without mention of cerebral infarction  US Carotid Duplex Bilateral   Costochondritis--heat, NSAIDs.  Handout given.  Reassurred re: her heart.  Neck pain 1 month ago--? Etiology.  Normal exam today, reassured.    Given abnormal carotid artery screen on [facility], will refer for bilateral carotid artery u/s.  HTN--well controlled.  Continue monitoring at home (120's-130's at home)  Lipids--labs reviewed from 6 months ago, normal.  At this point can do labs just yearly.  Schedule CPE with fasting med check for March

## 2012-03-15 NOTE — Patient Instructions (Addendum)
Costochondritis  Costochondritis (Tietze syndrome), or costochondral separation, is a swelling and irritation (inflammation) of the tissue (cartilage) that connects your ribs with your breastbone (sternum). It may occur on its own (spontaneously), through damage caused by an accident (trauma), or simply from coughing or minor exercise. It may take up to 6 weeks to get better and longer if you are unable to be conservative in your activities.  HOME CARE INSTRUCTIONS    Avoid exhausting physical activity. Try not to strain your ribs during normal activity. This would include any activities using chest, belly (abdominal), and side muscles, especially if heavy weights are used.   Use ice for 15 to 20 minutes per hour while awake for the first 2 days. Place the ice in a plastic bag, and place a towel between the bag of ice and your skin.   Only take over-the-counter or prescription medicines for pain, discomfort, or fever as directed by your caregiver.  SEEK IMMEDIATE MEDICAL CARE IF:    Your pain increases or you are very uncomfortable.   You have a fever.   You develop difficulty with your breathing.   You cough up blood.   You develop worse chest pains, shortness of breath, sweating, or vomiting.   You develop new, unexplained problems (symptoms).  MAKE SURE YOU:    Understand these instructions.   Will watch your condition.   Will get help right away if you are not doing well or get worse.  Document Released: 05/07/2005 Document Revised: 07/17/2011 Document Reviewed: 03/15/2008  [employer] Patient Information 2012 [employer].

## 2012-03-16 DIAGNOSIS — R350 Frequency of micturition: Secondary | ICD-10-CM | POA: Diagnosis not present

## 2012-03-17 ENCOUNTER — Ambulatory Visit: Payer: Medicare Other | Admitting: Family Medicine

## 2012-03-18 ENCOUNTER — Telehealth: Payer: Self-pay | Admitting: Family Medicine

## 2012-03-18 ENCOUNTER — Ambulatory Visit
Admission: RE | Admit: 2012-03-18 | Discharge: 2012-03-18 | Disposition: A | Discharge status: Home / Self Care | Payer: Medicare Other | Source: Ambulatory Visit | Attending: Family Medicine | Admitting: Family Medicine

## 2012-03-18 DIAGNOSIS — I669 Occlusion and stenosis of unspecified cerebral artery: Secondary | ICD-10-CM

## 2012-03-18 DIAGNOSIS — I658 Occlusion and stenosis of other precerebral arteries: Secondary | ICD-10-CM | POA: Diagnosis not present

## 2012-03-18 NOTE — Telephone Encounter (Signed)
Left message for patient letting her know the u/s results. I also let her know that the scan happened to find some abnormalities in thyroid and that it would be best evaluated by a thyroid ultrasound. I let her know that I would be out of town and back Aug 19th if she was okay waiting until then for me to schedule it as I believe she will be out of town as well. If she felt that she could not wait and needed it scheduled next week she could call and someone in our office could try to get it scheduled for her.

## 2012-03-18 NOTE — Telephone Encounter (Signed)
Advise pt that carotid u/s only showed mild plaque.  But it also happened to notice some abnormalities in thyroid that would best be evaluated by a thyroid ultrasound. Please schedule

## 2012-03-19 ENCOUNTER — Other Ambulatory Visit: Payer: Self-pay | Admitting: Family Medicine

## 2012-03-19 ENCOUNTER — Telehealth: Payer: Self-pay | Admitting: Family Medicine

## 2012-03-19 DIAGNOSIS — R9389 Abnormal findings on diagnostic imaging of other specified body structures: Secondary | ICD-10-CM

## 2012-03-19 NOTE — Telephone Encounter (Signed)
Patient has an appointment  @ [facility] on 03/22/12 @ 130 pm for a thyroid U/S. Patient was informed of her appointment by phone on 03/19/12 @ 810 am. CLS

## 2012-03-22 ENCOUNTER — Telehealth: Payer: Self-pay | Admitting: Family Medicine

## 2012-03-22 ENCOUNTER — Ambulatory Visit
Admission: RE | Admit: 2012-03-22 | Discharge: 2012-03-22 | Disposition: A | Discharge status: Home / Self Care | Payer: Medicare Other | Source: Ambulatory Visit | Attending: Family Medicine | Admitting: Family Medicine

## 2012-03-22 DIAGNOSIS — R9389 Abnormal findings on diagnostic imaging of other specified body structures: Secondary | ICD-10-CM

## 2012-03-22 DIAGNOSIS — E042 Nontoxic multinodular goiter: Secondary | ICD-10-CM | POA: Diagnosis not present

## 2012-03-22 NOTE — Telephone Encounter (Signed)
   Please call re ultrasound  results 

## 2012-03-23 ENCOUNTER — Telehealth: Payer: Self-pay | Admitting: Family Medicine

## 2012-03-23 NOTE — Telephone Encounter (Signed)
Please advise pt that thyroid showed multiple nodules.  Due to the size of two nodules, they recommend biopsy (minor procedure, done through radiology) to ensure that the larger nodules are in fact benign, as they likely are.  Last TSH was in March.  I recommend getting a TSH at her convenience.  If biopsy is normal, TSH is normal, and she is having no symptoms at all related to the nodules, no treatment is necessary.  If thyroid size causes any symptoms (pressure on throat, trouble swallowing, etc), then we can try suppressive therapy with thyroid medication.  If she would like a second opinion rather than getting biopsies of the two large nodules, I'd be happy to send her to an endocrinologist for their opinion

## 2012-03-23 NOTE — Telephone Encounter (Signed)
PT called yesterday and today, Dr. Susann Givens looked at results and advised multi nodular goiter, that pt is in no danger.  Advised pt when Dr. Lynelle Doctor returns on Monday that she or her nurse would get in touch with her for further info.

## 2012-03-29 ENCOUNTER — Telehealth: Payer: Self-pay | Admitting: *Deleted

## 2012-03-29 DIAGNOSIS — E042 Nontoxic multinodular goiter: Secondary | ICD-10-CM

## 2012-03-29 NOTE — Telephone Encounter (Signed)
Spoke with patient and I let her know that I spoke with Tammy @ [facility] and she will be in touch to get biopsy scheduled. Tammy did assure me that she will get patient scheduled and get test done this week. I instructed patient to call back once this is scheduled and get an appt for a lab visit to have TSH done, she wants to do it the same day as the biopsy, I put in orders for TSH-she just needs to call me back and I will schedule her an appt.

## 2012-03-29 NOTE — Telephone Encounter (Signed)
Spoke with patient and went over recommendations given by Dr.Knapp. Patient would like to go forth with the biopsy and TSH level. She states that she has been experiencing some symptoms that she read online such as raspy voice, swallowed food is getting stuck in her throat and she is also extremely sluggish-especially in the afternoons. I called over to [facility] and we just need to place orders and I can call and schedule. I told her I would get her a lab appt for a TSH when I call her back with the biopsy appt. Thanks.

## 2012-03-29 NOTE — Telephone Encounter (Signed)
Order entered--there are 2 nodules that were recommended to be biopsied.  Please schedule

## 2012-03-30 ENCOUNTER — Other Ambulatory Visit: Payer: Medicare Other

## 2012-03-30 ENCOUNTER — Ambulatory Visit
Admission: RE | Admit: 2012-03-30 | Discharge: 2012-03-30 | Disposition: A | Discharge status: Home / Self Care | Payer: Medicare Other | Source: Ambulatory Visit | Attending: Family Medicine | Admitting: Family Medicine

## 2012-03-30 ENCOUNTER — Other Ambulatory Visit (HOSPITAL_COMMUNITY)
Admission: RE | Admit: 2012-03-30 | Discharge: 2012-03-30 | Disposition: A | Discharge status: Home / Self Care | Payer: Medicare Other | Source: Ambulatory Visit | Attending: Interventional Radiology | Admitting: Interventional Radiology

## 2012-03-30 DIAGNOSIS — E049 Nontoxic goiter, unspecified: Secondary | ICD-10-CM | POA: Diagnosis not present

## 2012-03-30 DIAGNOSIS — E042 Nontoxic multinodular goiter: Secondary | ICD-10-CM

## 2012-03-30 DIAGNOSIS — E041 Nontoxic single thyroid nodule: Secondary | ICD-10-CM | POA: Diagnosis not present

## 2012-03-30 LAB — TSH: TSH: 3.831 u[IU]/mL (ref 0.350–4.500)

## 2012-04-06 DIAGNOSIS — R350 Frequency of micturition: Secondary | ICD-10-CM | POA: Diagnosis not present

## 2012-04-09 NOTE — Telephone Encounter (Signed)
Dorothy Rojas 

## 2012-04-27 DIAGNOSIS — N3941 Urge incontinence: Secondary | ICD-10-CM | POA: Diagnosis not present

## 2012-05-25 DIAGNOSIS — R350 Frequency of micturition: Secondary | ICD-10-CM | POA: Diagnosis not present

## 2012-06-15 DIAGNOSIS — N3941 Urge incontinence: Secondary | ICD-10-CM | POA: Diagnosis not present

## 2012-07-06 DIAGNOSIS — R350 Frequency of micturition: Secondary | ICD-10-CM | POA: Diagnosis not present

## 2012-07-27 DIAGNOSIS — R350 Frequency of micturition: Secondary | ICD-10-CM | POA: Diagnosis not present

## 2012-08-10 DIAGNOSIS — Z23 Encounter for immunization: Secondary | ICD-10-CM | POA: Diagnosis not present

## 2012-08-17 DIAGNOSIS — R350 Frequency of micturition: Secondary | ICD-10-CM | POA: Diagnosis not present

## 2012-09-07 DIAGNOSIS — R350 Frequency of micturition: Secondary | ICD-10-CM | POA: Diagnosis not present

## 2012-09-21 DIAGNOSIS — H5710 Ocular pain, unspecified eye: Secondary | ICD-10-CM | POA: Diagnosis not present

## 2012-09-28 DIAGNOSIS — R350 Frequency of micturition: Secondary | ICD-10-CM | POA: Diagnosis not present

## 2012-10-06 ENCOUNTER — Telehealth: Payer: Self-pay | Admitting: Family Medicine

## 2012-10-06 DIAGNOSIS — D7589 Other specified diseases of blood and blood-forming organs: Secondary | ICD-10-CM

## 2012-10-06 NOTE — Telephone Encounter (Signed)
Lab orders were entered.  Okay to schedule labs for 1-2 days prior to visit

## 2012-10-07 NOTE — Telephone Encounter (Signed)
LM

## 2012-10-08 ENCOUNTER — Other Ambulatory Visit: Payer: Medicare Other

## 2012-10-08 DIAGNOSIS — E7800 Pure hypercholesterolemia, unspecified: Secondary | ICD-10-CM | POA: Diagnosis not present

## 2012-10-08 LAB — COMPREHENSIVE METABOLIC PANEL
ALT: 26 U/L (ref 0–35)
BUN: 15 mg/dL (ref 6–23)
CO2: 29 mEq/L (ref 19–32)
Calcium: 9.3 mg/dL (ref 8.4–10.5)
Chloride: 102 mEq/L (ref 96–112)
Creat: 0.71 mg/dL (ref 0.50–1.10)
Glucose, Bld: 86 mg/dL (ref 70–99)
Total Bilirubin: 0.5 mg/dL (ref 0.3–1.2)

## 2012-10-08 LAB — LIPID PANEL
Cholesterol: 118 mg/dL (ref 0–200)
HDL: 43 mg/dL (ref >39)
Triglycerides: 98 mg/dL (ref <150)

## 2012-10-08 LAB — CBC WITH DIFFERENTIAL/PLATELET
Basophils Absolute: 0 10*3/uL (ref 0.0–0.1)
Basophils Relative: 0 % (ref 0–1)
HCT: 33.7 % — ABNORMAL LOW (ref 36.0–46.0)
Lymphocytes Relative: 42 % (ref 12–46)
MCHC: 34.4 g/dL (ref 30.0–36.0)
Monocytes Absolute: 0.4 10*3/uL (ref 0.1–1.0)
Neutro Abs: 2.5 10*3/uL (ref 1.7–7.7)
Neutrophils Relative %: 48 % (ref 43–77)
RDW: 13.8 % (ref 11.5–15.5)
WBC: 5.2 10*3/uL (ref 4.0–10.5)

## 2012-10-13 ENCOUNTER — Encounter: Payer: Self-pay | Admitting: Family Medicine

## 2012-10-13 ENCOUNTER — Ambulatory Visit (INDEPENDENT_AMBULATORY_CARE_PROVIDER_SITE_OTHER): Payer: Medicare Other | Admitting: Family Medicine

## 2012-10-13 ENCOUNTER — Other Ambulatory Visit: Payer: Self-pay

## 2012-10-13 VITALS — BP 100/60 | HR 72 | Ht 64.0 in | Wt 129.0 lb

## 2012-10-13 DIAGNOSIS — Z1231 Encounter for screening mammogram for malignant neoplasm of breast: Secondary | ICD-10-CM

## 2012-10-13 DIAGNOSIS — Z0000 Encounter for general adult medical examination without abnormal findings: Secondary | ICD-10-CM | POA: Diagnosis not present

## 2012-10-13 DIAGNOSIS — E7800 Pure hypercholesterolemia, unspecified: Secondary | ICD-10-CM

## 2012-10-13 DIAGNOSIS — I6529 Occlusion and stenosis of unspecified carotid artery: Secondary | ICD-10-CM | POA: Insufficient documentation

## 2012-10-13 MED ORDER — SIMVASTATIN 40 MG PO TABS: 40.0000 mg | ORAL_TABLET | Freq: Every day | ORAL | Status: DC

## 2012-10-13 MED ORDER — LISINOPRIL-HYDROCHLOROTHIAZIDE 20-12.5 MG PO TABS: 1.0000 | ORAL_TABLET | Freq: Every day | ORAL | Status: DC

## 2012-10-13 NOTE — Progress Notes (Signed)
Chief Complaint  Patient presents with  . Med check plus    nonfasting med check plus with pap/pelvic-labs done 10/08/12. Was seen for vertigo 06/2011. Symptoms are recurring but slightly different. When she lays down to go to bed she feels like "shock waves" are running through her head.   Patient presents for med check, and annual wellness Medicare visit.  AWV:  See scanned questionnaires--negative for depression and ADL issues. Other doctors involved in care of patient: Dr. McDiarmid (urology) Dr. Nile Riggs (ophtho) Dr. Virginia Rochester (GI--hasn't seen since colonoscopy) Dr. Lendell Caprice (dentist)  End of life issues:  She has a living will, not a healthcare power of attorney.  Hypertension follow-up: Blood pressures elsewhere are 110-120/60-70.  Denies feeling lightheaded or presyncopal, headaches, chest pain. Denies side effects of medications.   Hyperlipidemia follow-up: Patient is reportedly following a low-fat, low cholesterol diet. Compliant with medications and denies medication side effects   Urinary incontinence: Followed by Dr. McDiarmid. She is on Mirabegron in addition to accupuncture (q3 weeks) and oxytrol patch, and this combination has been effective  Dorothy Rojas was last seen in November 2012 for vertigo. She had physical therapy, symptoms resolved. Recently had mild recurrence with mild symptoms since mid-January.  Symptoms are much milder than at initial presentation--when lies down at night, "brain feels spastic", sensation of movement, last just a few seconds.  Sometimes occurs if she turns her head quickly during the day.  Symptoms are very short-lived.  Multinodular thyroid--s/p FNA in August of dominant L nodule, which was benign.  Denies neck pain or dysphagia.  Carotid artery disease--had u/s in August showing: Mild bilateral carotid bifurcation and proximal ICA plaque,  resulting in less than 50% diameter stenosis. The exam does not  exclude plaque ulceration or embolization.  Continued surveillance  Recommended.  Feels bloated and retains fluid if she takes MVI, calcium, Vitamin E, and feels better without taking these.  Immunization History  Administered Date(s) Administered  . Influenza Whole 04/09/2011  . Pneumococcal Polysaccharide 04/09/2011  . Td 08/07/2006  . Zoster 10/25/2008   Last Pap smear: can't recall. S/p hysterectomy for benign reasons  Last mammogram: 11/2011  Last colonoscopy: approximately 2008 with Dr. Virginia Rochester  [facility] showing [facility] Ophtho: yearly  Dentist: regularly (3x/year)  Exercise:  Wii Fit plus, step aerobics (30 min), yoga, and walks (most days when weather is warmer).  Past Medical History  Diagnosis Date  . Hypertension   . Elevated cholesterol   . Osteopenia     last DEXA 10/2009  . Former smoker     Quit in 1984  . Mixed stress and urge urinary incontinence     Dr. McDiarmid  . Torn meniscus     L knee    Past Surgical History  Procedure Laterality Date  . Tonsillectomy    . Total abdominal hysterectomy w/ bilateral salpingoophorectomy  04/1999  . Appendectomy  1973  . Ovarian cyst removal  1973    LEFT OVARIAN  . Incontinence surgery  04/1999    Re-do at [facility] for sling complications in 2001  . Neuroma surgery  1980's    bilateral feet  . Lateral epicondyle release      Right  . Cystocele repair  11/2010    Dr. McDiarmid    History   Social History  . Marital Status: Married    Spouse Name: N/A    Number of Children: 3  . Years of Education: N/A   Occupational History  .  Social History Main Topics  . Smoking status: Former Smoker    Quit date: 08/11/1985  . Smokeless tobacco: Never Used  . Alcohol Use: Yes     Comment: 1 glass of wine on weekends only (2-3/week)  . Drug Use: No  . Sexually Active: Not Currently   Other Topics Concern  . Not on file   Social History Narrative   Married.  Lives with husband.  Children live in [place], [place].  Youngest child was  killed in MVA.  3 stepchildren are all local. 1 dog    Family History  Problem Relation Age of Onset  . Hypertension Father   . Heart disease Father 28    CABG, died from MI at 68  . COPD Father   . Cirrhosis Mother     related to psoriasis medications  . Diabetes Mother   . Osteogenesis imperfecta Sister   . Heart disease Sister     CHF  . Diabetes Brother   . Heart disease Brother 7    MI  . Cancer Neg Hx     Current outpatient prescriptions:lisinopril-hydrochlorothiazide (PRINZIDE,ZESTORETIC) 20-12.5 MG per tablet, Take 1 tablet by mouth daily., Disp: 90 tablet, Rfl: 1;  mirabegron ER (MYRBETRIQ) 50 MG TB24, Take 50 mg by mouth daily., Disp: , Rfl: ;  simvastatin (ZOCOR) 40 MG tablet, Take 1 tablet (40 mg total) by mouth at bedtime., Disp: 90 tablet, Rfl: 1  Allergies  Allergen Reactions  . Naprosyn (Naproxen) Rash   ROS:The patient denies anorexia, fever, weight changes (some intentional loss, cut back on alcohol; lost 5 pounds since last visit), headaches, vision changes, decreased hearing, ear pain, sore throat, breast concerns, chest pain, palpitations,  syncope, dyspnea on exertion, cough, swelling, nausea, vomiting, diarrhea, constipation, abdominal pain, melena, hematochezia, hematuria, dysuria, vaginal bleeding, discharge, odor or itch, genital lesions, numbness, tingling, weakness, tremor, suspicious skin lesions, depression, anxiety, abnormal bleeding/bruising, or enlarged lymph nodes.  Occasional L knee pain with certain activities (walking hills).  PHYSICAL EXAM: BP 100/60  Pulse 72  Ht 5\' 4"  (1.626 m)  Wt 129 lb (58.514 kg)  BMI 22.13 kg/m2  General Appearance:  Alert, cooperative, no distress, appears stated age   Head:  Normocephalic, without obvious abnormality, atraumatic   Eyes:  PERRL, conjunctiva/corneas clear, EOM's intact, fundi  benign   Ears:  Normal TM's and external ear canals   Nose:  Nares normal, mucosa normal, no drainage or sinus tenderness    Throat:  Lips, mucosa, and tongue normal; teeth and gums normal   Neck:  Supple, no lymphadenopathy; thyroid: no enlargement/tenderness/nodules; no carotid  bruit or JVD   Back:  Spine nontender, no curvature, ROM normal, no CVA tenderness   Lungs:  Clear to auscultation bilaterally without wheezes, rales or ronchi; respirations unlabored   Chest Wall:  No tenderness or deformity   Heart:  Regular rate and rhythm, S1 and S2 normal, no murmur, rub  or gallop   Breast Exam:  No tenderness, masses, or nipple discharge.  Nipples are inverted bilaterally (chronic, per pt). No axillary lymphadenopathy   Abdomen:  Soft, non-tender, nondistended, normoactive bowel sounds,  no masses, no hepatosplenomegaly   Genitalia:  Normal external genitalia without lesions. BUS and vagina normal; Uterus and adnexa surgically absent. No masses palpable   Rectal:  Normal tone, no masses or tenderness; guaiac negative stool   Extremities:  No clubbing, cyanosis or edema   Pulses:  2+ and symmetric all extremities   Skin:  Skin color,  texture, turgor normal, no rashes or suspicious lesions. Many SK's.   Lymph nodes:  Cervical, supraclavicular, and axillary nodes normal   Neurologic:  CNII-XII intact, normal strength, sensation and gait; reflexes 2+ and symmetric throughout           Psych: Normal mood, affect, hygiene and grooming.    Lab Results  Component Value Date   WBC 5.2 10/08/2012   HGB 11.6* 10/08/2012   HCT 33.7* 10/08/2012   MCV 93.4 10/08/2012   PLT 199 10/08/2012   Lab Results  Component Value Date   CHOL 118 10/08/2012   HDL 43 10/08/2012   LDLCALC 55 10/08/2012   TRIG 98 10/08/2012   CHOLHDL 2.7 10/08/2012     Chemistry      Component Value Date/Time   NA 141 10/08/2012 0850   K 4.3 10/08/2012 0850   CL 102 10/08/2012 0850   CO2 29 10/08/2012 0850   BUN 15 10/08/2012 0850   CREATININE 0.71 10/08/2012 0850   CREATININE 0.98 12/04/2010 0533      Component Value Date/Time   CALCIUM 9.3  10/08/2012 0850   ALKPHOS 55 10/08/2012 0850   AST 21 10/08/2012 0850   ALT 26 10/08/2012 0850   BILITOT 0.5 10/08/2012 0850     Glucose 86  Lab Results  Component Value Date   TSH 3.831 03/30/2012   ASSESSMENT/PLAN:  Pure hypercholesterolemia - controlled - Plan: simvastatin (ZOCOR) 40 MG tablet  Essential hypertension, benign - well controlled - Plan: lisinopril-hydrochlorothiazide (PRINZIDE,ZESTORETIC) 20-12.5 MG per tablet  Osteopenia - Plan: DG Bone Density  Multinodular goiter - asymptomatic.  discussed potential symptoms, and to return should these occur, to discuss suppressive therapy  Carotid stenosis, bilateral - asymptomatic, <50%.  recommend aspirin 81mg  daily.  Vertigo - very mild, short-lived, intermittent  Medicare annual wellness visit, initial  F/u 6 months for med check.  Lipids not needed.  No labs needed, all well controlled and can check yearly (very mild anemia is stable/chronic/unchanged.  Recommend ASA 81mg  daily (coated) and starting 1000 IU of Vitamin D3 (over-the-counter)  Consider changing to plain lisinopril (stopping HCTZ) if BP's remain <120's, as this might possibly contribute/exacerbate some bladder symptoms.  AWV--MOST form filled out.  Given information/forms for healthcare power of attorney.  Reviewed all covered benefits.  She is UTD.  See scanned sheet that was also given to pt.

## 2012-10-13 NOTE — Patient Instructions (Signed)
HEALTH MAINTENANCE RECOMMENDATIONS:  It is recommended that you get at least 30 minutes of aerobic exercise at least 5 days/week (for weight loss, you may need as much as 60-90 minutes). This can be any activity that gets your heart rate up. This can be divided in 10-15 minute intervals if needed, but try and build up your endurance at least once a week.  Weight bearing exercise is also recommended twice weekly.  Eat a healthy diet with lots of vegetables, fruits and fiber.  "Colorful" foods have a lot of vitamins (ie green vegetables, tomatoes, red peppers, etc).  Limit sweet tea, regular sodas and alcoholic beverages, all of which has a lot of calories and sugar.  Up to 1 alcoholic drink daily may be beneficial for women (unless trying to lose weight, watch sugars).  Drink a lot of water.  Calcium recommendations are 1200-1500 mg daily (1500 mg for postmenopausal women or women without ovaries), and vitamin D 1000 IU daily.  This should be obtained from diet and/or supplements (vitamins), and calcium should not be taken all at once, but in divided doses.  Monthly self breast exams and yearly mammograms for women over the age of 56 is recommended.  Sunscreen of at least SPF 30 should be used on all sun-exposed parts of the skin when outside between the hours of 10 am and 4 pm (not just when at beach or pool, but even with exercise, golf, tennis, and yard work!)  Use a sunscreen that says "broad spectrum" so it covers both UVA and UVB rays, and make sure to reapply every 1-2 hours.  Remember to change the batteries in your smoke detectors when changing your clock times in the spring and fall.  Use your seat belt every time you are in a car, and please drive safely and not be distracted with cell phones and texting while driving.  Recommend ASA 81mg  daily (coated) and starting 1000 IU of Vitamin D3 (over-the-counter) Check with [facility] regarding TYPE of tetanus shot received 08/07/06.  ? If was  abstracted correctly into EMR as Td, or if could have truly received TdaP (containing pertussis).  If it truly was Td, then a TdaP is recommended (not covered by Medicare).

## 2012-10-17 ENCOUNTER — Encounter: Payer: Self-pay | Admitting: Family Medicine

## 2012-11-15 ENCOUNTER — Ambulatory Visit
Admission: RE | Admit: 2012-11-15 | Discharge: 2012-11-15 | Disposition: A | Discharge status: Home / Self Care | Payer: Medicare Other | Source: Ambulatory Visit

## 2012-11-15 ENCOUNTER — Ambulatory Visit
Admission: RE | Admit: 2012-11-15 | Discharge: 2012-11-15 | Disposition: A | Discharge status: Home / Self Care | Payer: Medicare Other | Source: Ambulatory Visit | Attending: Family Medicine | Admitting: Family Medicine

## 2012-11-15 DIAGNOSIS — Z1231 Encounter for screening mammogram for malignant neoplasm of breast: Secondary | ICD-10-CM | POA: Diagnosis not present

## 2012-11-15 DIAGNOSIS — M899 Disorder of bone, unspecified: Secondary | ICD-10-CM | POA: Diagnosis not present

## 2012-11-15 DIAGNOSIS — M8580 Other specified disorders of bone density and structure, unspecified site: Secondary | ICD-10-CM

## 2012-11-15 LAB — HM DEXA SCAN

## 2012-11-16 ENCOUNTER — Encounter: Payer: Self-pay | Admitting: Family Medicine

## 2012-11-16 DIAGNOSIS — R350 Frequency of micturition: Secondary | ICD-10-CM | POA: Diagnosis not present

## 2012-12-01 ENCOUNTER — Encounter: Payer: Self-pay | Admitting: Internal Medicine

## 2012-12-01 DIAGNOSIS — M25569 Pain in unspecified knee: Secondary | ICD-10-CM | POA: Diagnosis not present

## 2012-12-07 DIAGNOSIS — R350 Frequency of micturition: Secondary | ICD-10-CM | POA: Diagnosis not present

## 2012-12-28 DIAGNOSIS — R350 Frequency of micturition: Secondary | ICD-10-CM | POA: Diagnosis not present

## 2012-12-29 ENCOUNTER — Telehealth: Payer: Self-pay | Admitting: *Deleted

## 2012-12-29 DIAGNOSIS — N952 Postmenopausal atrophic vaginitis: Secondary | ICD-10-CM

## 2012-12-29 MED ORDER — ESTROGENS, CONJUGATED 0.625 MG/GM VA CREA: TOPICAL_CREAM | VAGINAL | Status: DC

## 2012-12-29 NOTE — Telephone Encounter (Signed)
Advise pt--can start premarin vaginal cream, 2-3x/week.  Use intravaginally as directed.  rx sent to pharmacy.

## 2012-12-29 NOTE — Telephone Encounter (Signed)
Patient called and left a voicemail stating that last month when she saw you, you had asked a question [id number] dryness with intercourse. At that time she was okay but over the past month she has been experiencing vaginal dryness along with painful intercourse. She wants to know is there something that you would be able to call in for her or should she schedule an appointment? Please advise. Thanks.

## 2012-12-30 ENCOUNTER — Telehealth: Payer: Self-pay | Admitting: *Deleted

## 2012-12-30 NOTE — Telephone Encounter (Signed)
Pt advised.

## 2013-01-19 DIAGNOSIS — R350 Frequency of micturition: Secondary | ICD-10-CM | POA: Diagnosis not present

## 2013-02-16 DIAGNOSIS — H2510 Age-related nuclear cataract, unspecified eye: Secondary | ICD-10-CM | POA: Diagnosis not present

## 2013-02-16 DIAGNOSIS — H524 Presbyopia: Secondary | ICD-10-CM | POA: Diagnosis not present

## 2013-03-08 DIAGNOSIS — R350 Frequency of micturition: Secondary | ICD-10-CM | POA: Diagnosis not present

## 2013-03-08 DIAGNOSIS — M25569 Pain in unspecified knee: Secondary | ICD-10-CM | POA: Diagnosis not present

## 2013-03-10 ENCOUNTER — Other Ambulatory Visit (INDEPENDENT_AMBULATORY_CARE_PROVIDER_SITE_OTHER): Payer: Medicare Other

## 2013-03-10 DIAGNOSIS — Z0000 Encounter for general adult medical examination without abnormal findings: Secondary | ICD-10-CM | POA: Diagnosis not present

## 2013-03-10 LAB — POC HEMOCCULT BLD/STL (HOME/3-CARD/SCREEN): Card #3 Fecal Occult Blood, POC: NEGATIVE

## 2013-03-14 ENCOUNTER — Telehealth: Payer: Self-pay | Admitting: Family Medicine

## 2013-03-14 DIAGNOSIS — E7800 Pure hypercholesterolemia, unspecified: Secondary | ICD-10-CM

## 2013-03-14 DIAGNOSIS — E042 Nontoxic multinodular goiter: Secondary | ICD-10-CM

## 2013-03-14 DIAGNOSIS — D649 Anemia, unspecified: Secondary | ICD-10-CM

## 2013-03-14 NOTE — Telephone Encounter (Signed)
Orders entered.  Okay to schedule lab visit 

## 2013-03-14 NOTE — Telephone Encounter (Signed)
This was sent to me.

## 2013-03-14 NOTE — Telephone Encounter (Signed)
Pt called and stated she would like to have labs prior to her med check in September. Please let inform pt and place on schedule it ok.

## 2013-03-22 DIAGNOSIS — M25569 Pain in unspecified knee: Secondary | ICD-10-CM | POA: Diagnosis not present

## 2013-03-25 DIAGNOSIS — M25569 Pain in unspecified knee: Secondary | ICD-10-CM | POA: Diagnosis not present

## 2013-04-05 DIAGNOSIS — M23349 Other meniscus derangements, anterior horn of lateral meniscus, unspecified knee: Secondary | ICD-10-CM | POA: Diagnosis not present

## 2013-04-05 DIAGNOSIS — Y9389 Activity, other specified: Secondary | ICD-10-CM | POA: Diagnosis not present

## 2013-04-05 DIAGNOSIS — X58XXXA Exposure to other specified factors, initial encounter: Secondary | ICD-10-CM | POA: Diagnosis not present

## 2013-04-05 DIAGNOSIS — IMO0002 Reserved for concepts with insufficient information to code with codable children: Secondary | ICD-10-CM | POA: Diagnosis not present

## 2013-04-05 DIAGNOSIS — M2340 Loose body in knee, unspecified knee: Secondary | ICD-10-CM | POA: Diagnosis not present

## 2013-04-05 DIAGNOSIS — M2390 Unspecified internal derangement of unspecified knee: Secondary | ICD-10-CM | POA: Diagnosis not present

## 2013-04-05 DIAGNOSIS — G8918 Other acute postprocedural pain: Secondary | ICD-10-CM | POA: Diagnosis not present

## 2013-04-05 DIAGNOSIS — Y998 Other external cause status: Secondary | ICD-10-CM | POA: Diagnosis not present

## 2013-04-05 DIAGNOSIS — M9420 Chondromalacia, unspecified site: Secondary | ICD-10-CM | POA: Diagnosis not present

## 2013-04-05 HISTORY — PX: KNEE ARTHROSCOPY: SHX127

## 2013-04-12 ENCOUNTER — Other Ambulatory Visit: Payer: Medicare Other

## 2013-04-12 DIAGNOSIS — E7800 Pure hypercholesterolemia, unspecified: Secondary | ICD-10-CM | POA: Diagnosis not present

## 2013-04-12 DIAGNOSIS — E042 Nontoxic multinodular goiter: Secondary | ICD-10-CM | POA: Diagnosis not present

## 2013-04-12 DIAGNOSIS — D649 Anemia, unspecified: Secondary | ICD-10-CM | POA: Diagnosis not present

## 2013-04-12 LAB — LIPID PANEL
Cholesterol: 158 mg/dL (ref 0–200)
HDL: 53 mg/dL (ref >39)
Total CHOL/HDL Ratio: 3 Ratio
Triglycerides: 167 mg/dL — ABNORMAL HIGH (ref <150)

## 2013-04-12 LAB — CBC WITH DIFFERENTIAL/PLATELET
Eosinophils Relative: 2 % (ref 0–5)
Lymphocytes Relative: 38 % (ref 12–46)
Lymphs Abs: 2.3 10*3/uL (ref 0.7–4.0)
MCV: 98.2 fL (ref 78.0–100.0)
Neutrophils Relative %: 50 % (ref 43–77)
Platelets: 251 10*3/uL (ref 150–400)
RBC: 3.84 MIL/uL — ABNORMAL LOW (ref 3.87–5.11)
WBC: 5.9 10*3/uL (ref 4.0–10.5)

## 2013-04-12 LAB — HEPATIC FUNCTION PANEL
ALT: 23 U/L (ref 0–35)
AST: 16 U/L (ref 0–37)
Albumin: 4.2 g/dL (ref 3.5–5.2)
Total Protein: 6.7 g/dL (ref 6.0–8.3)

## 2013-04-13 DIAGNOSIS — R350 Frequency of micturition: Secondary | ICD-10-CM | POA: Diagnosis not present

## 2013-04-13 LAB — TSH: TSH: 2.622 u[IU]/mL (ref 0.350–4.500)

## 2013-04-14 ENCOUNTER — Telehealth: Payer: Self-pay | Admitting: Internal Medicine

## 2013-04-14 ENCOUNTER — Ambulatory Visit (INDEPENDENT_AMBULATORY_CARE_PROVIDER_SITE_OTHER): Payer: Medicare Other | Admitting: Family Medicine

## 2013-04-14 ENCOUNTER — Encounter: Payer: Self-pay | Admitting: Family Medicine

## 2013-04-14 VITALS — BP 110/58 | HR 72 | Ht 64.0 in | Wt 128.0 lb

## 2013-04-14 DIAGNOSIS — M899 Disorder of bone, unspecified: Secondary | ICD-10-CM | POA: Diagnosis not present

## 2013-04-14 DIAGNOSIS — N3281 Overactive bladder: Secondary | ICD-10-CM | POA: Insufficient documentation

## 2013-04-14 DIAGNOSIS — I6529 Occlusion and stenosis of unspecified carotid artery: Secondary | ICD-10-CM

## 2013-04-14 DIAGNOSIS — E7800 Pure hypercholesterolemia, unspecified: Secondary | ICD-10-CM

## 2013-04-14 DIAGNOSIS — Z23 Encounter for immunization: Secondary | ICD-10-CM

## 2013-04-14 DIAGNOSIS — I10 Essential (primary) hypertension: Secondary | ICD-10-CM

## 2013-04-14 DIAGNOSIS — N318 Other neuromuscular dysfunction of bladder: Secondary | ICD-10-CM

## 2013-04-14 DIAGNOSIS — M8580 Other specified disorders of bone density and structure, unspecified site: Secondary | ICD-10-CM | POA: Insufficient documentation

## 2013-04-14 MED ORDER — SIMVASTATIN 40 MG PO TABS: 40.0000 mg | ORAL_TABLET | Freq: Every day | ORAL | Status: DC

## 2013-04-14 MED ORDER — LISINOPRIL-HYDROCHLOROTHIAZIDE 20-12.5 MG PO TABS: 1.0000 | ORAL_TABLET | Freq: Every day | ORAL | Status: DC

## 2013-04-14 NOTE — Patient Instructions (Signed)
look into whether 2007 tetanus was Td vs Tdap prior your next physical (so we can know if we need to give you another vaccine).  Continue current medications. Add upper body weight-bearing exercises. Continue calcium with D (1200-1500mg  of Calcium, [phone] IU of Vitamin D; daily, from all sources combined)

## 2013-04-14 NOTE — Progress Notes (Signed)
Chief Complaint  Patient presents with  . Med check    nonfasting med check, labs done 04/12/13.   Hypertension follow-up:  Blood pressures elsewhere are usually 120's-130's, sometimes up to 150/70-80's.  Denies dizziness, headaches, chest pain.  Denies side effects of medications.  Periodically still gets some vertigo with certain position changes when lying in bed.  Only intermittent now.  Had knee surgery last week, so hasn't been able to walk like she usually does.  Plans to start water aerobics or stationary bike at the [facility] this week.  Knee feels much better.  Carotid artery results reviewed--ultrasound done last year, f/u recommended.  She remains on aspirin therapy, and BP and lipid-lowering therapy to reduce risks.  DEXA results reviewed--osteopenia.  Past Medical History  Diagnosis Date  . Hypertension   . Elevated cholesterol   . Osteopenia     last DEXA 10/2009  . Former smoker     Quit in 1984  . Mixed stress and urge urinary incontinence     Dr. McDiarmid  . Torn meniscus     L knee   Past Surgical History  Procedure Laterality Date  . Tonsillectomy    . Total abdominal hysterectomy w/ bilateral salpingoophorectomy  04/1999  . Appendectomy  1973  . Ovarian cyst removal  1973    LEFT OVARIAN  . Incontinence surgery  04/1999    Re-do at [facility] for sling complications in 2001  . Neuroma surgery  1980's    bilateral feet  . Lateral epicondyle release      Right  . Cystocele repair  11/2010    Dr. McDiarmid  . Knee arthroscopy Right 04/05/13    Dr. Rennis Chris   History   Social History  . Marital Status: Married    Spouse Name: N/A    Number of Children: 3  . Years of Education: N/A   Occupational History  .     Social History Main Topics  . Smoking status: Former Smoker    Quit date: 08/11/1985  . Smokeless tobacco: Never Used  . Alcohol Use: Yes     Comment: 1 glass of wine on weekends only (2-3/week)  . Drug Use: No  . Sexual Activity: Not Currently    Other Topics Concern  . Not on file   Social History Narrative   Married.  Lives with husband.  Children live in [place], [place].  Youngest child was killed in MVA.  3 stepchildren are all local. 1 dog   ROS:  Denies weight changes, fevers, URI symptoms, cough, shortness of breath, chest pain, GI complaints, GU complaints (bladder symptoms controlled on current regimen, getting treatments every 5 weeks).  Denies rashes, bleeding, bruising, headaches, dizziness or other concerns.   PHYSICAL EXAM: BP 120/70  Pulse 72  Ht 5\' 4"  (1.626 m)  Wt 128 lb (58.06 kg)  BMI 21.96 kg/m2 110/58 on repeat by MD, RA Well developed, pleasant female in no distress HEENT:  PERRL, EOMI, conjunctiva clear.  OP clear Neck: no lymphadenopathy, thyromegaly, mass or carotid bruit Heart: regular rate and rhythm without murmur Lungs: clear bilaterally Abdomen: soft, nontender, no mass Extremities: no edema, 2+ pulse Skin: no rashes, bruising or suspicious lesions Neuro: alert and oriented, cranial nerves 2-12 intact.  Normal strength, sensation, gait Psych: normal mood, affect, hygiene and grooming  Lab Results  Component Value Date   WBC 5.9 04/12/2013   HGB 12.5 04/12/2013   HCT 37.7 04/12/2013   MCV 98.2 04/12/2013   PLT 251  04/12/2013   Lab Results  Component Value Date   CHOL 158 04/12/2013   HDL 53 04/12/2013   LDLCALC 72 04/12/2013   TRIG 167* 04/12/2013   CHOLHDL 3.0 04/12/2013   Lab Results  Component Value Date   TSH 2.622 04/12/2013   Lab Results  Component Value Date   ALT 23 04/12/2013   AST 16 04/12/2013   ALKPHOS 51 04/12/2013   BILITOT 0.6 04/12/2013    ASSESSMENT/PLAN:  Pure hypercholesterolemia - controlled - Plan: simvastatin (ZOCOR) 40 MG tablet  Essential hypertension, benign - controlled - Plan: lisinopril-hydrochlorothiazide (PRINZIDE,ZESTORETIC) 20-12.5 MG per tablet  Carotid stenosis, unspecified laterality - Plan: US Carotid Duplex Bilateral  Osteopenia  OAB (overactive bladder) -  controlled  Need for prophylactic vaccination and inoculation against influenza - Plan: Flu Vaccine QUAD 36+ mos IM  Osteopenia:  Add weight-bearing exercise.  Continue calcium and vitamin D  F/u 6 months at CPE  Will look into whether 2007 tetanus was Td vs Tdap prior to her CPE

## 2013-04-14 NOTE — Telephone Encounter (Signed)
She can come fasting to visit--I'm sure her labs will likely all be normal, so we can do AT visit, if scheduled in the morning.  If she prefers to come early, let me know and I'll put in future orders, but I'm fine with her having labs done at her visit (needs to be fasting)

## 2013-04-14 NOTE — Telephone Encounter (Signed)
Spoke with patient and she is fine doing labs at CPE, she will come in fasting.

## 2013-04-14 NOTE — Telephone Encounter (Signed)
When pt was here she scheduled an appt for march for a cpe and she called wanting to know if she needs to come in a few days early for labs or do it at the visit

## 2013-04-25 ENCOUNTER — Ambulatory Visit
Admission: RE | Admit: 2013-04-25 | Discharge: 2013-04-25 | Disposition: A | Discharge status: Home / Self Care | Payer: Medicare Other | Source: Ambulatory Visit | Attending: Family Medicine | Admitting: Family Medicine

## 2013-04-25 DIAGNOSIS — I6529 Occlusion and stenosis of unspecified carotid artery: Secondary | ICD-10-CM

## 2013-04-25 DIAGNOSIS — I658 Occlusion and stenosis of other precerebral arteries: Secondary | ICD-10-CM | POA: Diagnosis not present

## 2013-05-18 DIAGNOSIS — R350 Frequency of micturition: Secondary | ICD-10-CM | POA: Diagnosis not present

## 2013-06-21 DIAGNOSIS — Z9889 Other specified postprocedural states: Secondary | ICD-10-CM | POA: Diagnosis not present

## 2013-06-21 DIAGNOSIS — M25569 Pain in unspecified knee: Secondary | ICD-10-CM | POA: Diagnosis not present

## 2013-06-22 DIAGNOSIS — R350 Frequency of micturition: Secondary | ICD-10-CM | POA: Diagnosis not present

## 2013-07-12 DIAGNOSIS — R350 Frequency of micturition: Secondary | ICD-10-CM | POA: Diagnosis not present

## 2013-07-28 ENCOUNTER — Ambulatory Visit (INDEPENDENT_AMBULATORY_CARE_PROVIDER_SITE_OTHER): Payer: Medicare Other | Admitting: Family Medicine

## 2013-07-28 ENCOUNTER — Ambulatory Visit (HOSPITAL_COMMUNITY)
Admission: RE | Admit: 2013-07-28 | Discharge: 2013-07-28 | Disposition: A | Discharge status: Home / Self Care | Payer: Medicare Other | Source: Ambulatory Visit | Attending: Family Medicine | Admitting: Family Medicine

## 2013-07-28 ENCOUNTER — Encounter: Payer: Self-pay | Admitting: Family Medicine

## 2013-07-28 ENCOUNTER — Other Ambulatory Visit (HOSPITAL_COMMUNITY): Payer: Self-pay | Admitting: Family Medicine

## 2013-07-28 VITALS — BP 120/70 | HR 84 | Temp 98.1°F | Ht 64.0 in | Wt 128.0 lb

## 2013-07-28 DIAGNOSIS — I82409 Acute embolism and thrombosis of unspecified deep veins of unspecified lower extremity: Secondary | ICD-10-CM | POA: Insufficient documentation

## 2013-07-28 DIAGNOSIS — M7989 Other specified soft tissue disorders: Secondary | ICD-10-CM

## 2013-07-28 DIAGNOSIS — I82402 Acute embolism and thrombosis of unspecified deep veins of left lower extremity: Secondary | ICD-10-CM

## 2013-07-28 MED ORDER — RIVAROXABAN (XARELTO) VTE STARTER PACK (15 & 20 MG): ORAL_TABLET | ORAL | Status: DC

## 2013-07-28 NOTE — Patient Instructions (Addendum)
We are going to send you for an ultrasound of the leg to rule out deep venous thrombosis.   Based on exam, I'm more suspicious of a superficial thrombophlebitis--use heat, NSAIDs (motrin), and keep leg elevated. Compression stocking also recommended.  We will be in touch with results.  ADDENDUM:  SECOND HANDOUT:  Deep Vein Thrombosis A deep vein thrombosis (DVT) is a blood clot that develops in a deep vein. A DVT is a clot in the deep, larger veins of the leg, arm, or pelvis. These are more dangerous than clots that might form in veins near the surface of the body. A DVT can lead to complications if the clot breaks off and travels in the bloodstream to the lungs.  A DVT can damage the valves in your leg veins, so that instead of flowing upwards, the blood pools in the lower leg. This is called post-thrombotic syndrome, and can result in pain, swelling, discoloration, and sores on the leg. Once identified, a DVT can be treated. It can also be prevented in some circumstances. Once you have had a DVT, you may be at increased risk for a DVT in the future. CAUSES Blood clots form in a vein for different reasons. Usually several things contribute to blood clots. Contributing factors include:  The flow of blood slows down.  The inside of the vein is damaged in some way.  The person has a condition that makes blood clot more easily. Some people are more likely than others to develop blood clots. That is because they have more factors that make clots likely. These are called risk factors. Risk factors include:   Older age, especially over 26 years old.  Having a history of blood clots. This means you have had one before. Or, it means that someone else in your family has had blood clots. You may have a genetic tendency to form clots.  Having major or lengthy surgery. This is especially true for surgery on the hip, knee, or belly (abdomen). Hip surgery is particularly high risk.  Breaking a hip or  leg.  Sitting or lying still for a long time. This includes long distance travel, paralysis, or recovery from an illness or surgery.  Cancer, or cancer treatment.  Having a long, thin tube (catheter) placed inside a vein during a medical procedure.  Being overweight (obese).  Pregnancy and childbirth. Hormone changes make the blood clot more easily during pregnancy. The fetus puts pressure on the veins of the pelvis. There is also risk of injury to veins during delivery or a caesarean. The risk is at its highest just after childbirth.  Medicines with the female hormone estrogen. This includes birth control pills and hormone replacement therapy.  Smoking.  Other circulation or heart problems. SYMPTOMS When a clot forms, it can either partially or totally block the blood flow in that vein. Symptoms of a DVT can include:  Swelling of the leg or arm, especially if one side is much worse.  Warmth and redness of the leg or arm, especially if one side is much worse.  Pain in an arm or leg. If the clot is in the leg, symptoms may be more noticeable or worse when standing or walking. The symptoms of a DVT that has traveled to the lungs (pulmonary embolism, PE) usually start suddenly, and include:  Shortness of breath.  Coughing.  Coughing up blood or blood-tinged phlegm.  Chest pain. The chest pain is often worse with deep breaths.  Rapid heartbeat. Anyone with  these symptoms should get emergency medical treatment right away. Call your local emergency services (911 in [place] if you have these symptoms. DIAGNOSIS If a DVT is suspected, your caregiver will take a full medical history and carry out a physical exam. Tests that also may be required include:  Blood tests, including studies of the clotting properties of the blood.  Ultrasonography to see if you have clots in your legs or lungs.  X-rays to show the flow of blood when dye is injected into the veins (venography).  Studies  of your lungs, if you have any chest symptoms. PREVENTION  Exercise the legs regularly. Take a brisk 30 minute walk every day.  Maintain a weight that is appropriate for your height.  Avoid sitting or lying in bed for long periods of time without moving your legs.  Women, particularly those over the age of 67, should consider the risks and benefits of taking estrogen medicines, including birth control pills.  Do not smoke, especially if you take estrogen medicines.  Long distance travel can increase your risk of DVT. You should exercise your legs by walking or pumping the muscles every hour.  In-hospital prevention:  Many of the risk factors above relate to situations that exist with hospitalization, either for illness, injury, or elective surgery.  Your caregiver will assess you for the need for venous thromboembolism prophylaxis when you are admitted to the hospital. If you are having surgery, your surgeon will assess you the day of or day after surgery.  Prevention may include medical and nonmedical measures. TREATMENT Treatment for DVT helps prevent death and disability. The most common treatment for DVT is blood thinning (anticoagulant) medicine, which reduces the blood's tendency to clot. Anticoagulants can stop new blood clots from forming and old ones from growing. They cannot dissolve existing clots. Your body does this by itself over time. Anticoagulants can be given by mouth, by intravenous (IV) access, or by injection. Your caregiver will determine the best program for you.  Heparin or related medicines (low molecular weight heparin) are usually the first treatment for a blood clot. They act quickly. However, they cannot be taken orally.  Heparin can cause a fall in a component of blood that stops bleeding and forms blood clots (platelets). You will be monitored with blood tests to be sure this does not occur.  Warfarin is an anticoagulant that can be swallowed (taken  orally). It takes a few days to start working, so usually heparin or related medicines are used in combination. Once warfarin is working, heparin is usually stopped.  Less commonly, clot dissolving drugs (thrombolytics) are used to dissolve a DVT. They carry a high risk of bleeding, so they are used mainly in severe cases, where a life or limb is threatened.  Very rarely, a blood clot in the leg needs to be removed surgically.  If you are unable to take anticoagulants, your caregiver may arrange for you to have a filter placed in a main vein in your belly (abdomen). This filter prevents clots from traveling to your lungs. HOME CARE INSTRUCTIONS  Take all medicines prescribed by your caregiver. Follow the directions carefully.  Warfarin. Most people will continue taking warfarin after hospital discharge. Your caregiver will advise you on the length of treatment (usually 3 6 months, sometimes lifelong).  Too much and too little warfarin are both dangerous. Too much warfarin increases the risk of bleeding. Too little warfarin continues to allow the risk for blood clots. While taking warfarin, you  will need to have regular blood tests to measure your blood clotting time. These blood tests usually include both the prothrombin time (PT) and international normalized ratio (INR) tests. The PT and INR results allow your caregiver to adjust your dose of warfarin. The dose can change for many reasons. It is critically important that you take warfarin exactly as prescribed, and that you have your PT and INR levels drawn exactly as directed.  Many foods, especially foods high in vitamin K can interfere with warfarin and affect the PT and INR results. Foods high in vitamin K include spinach, kale, broccoli, cabbage, collard and turnip greens, brussels sprouts, peas, cauliflower, seaweed, and parsley as well as beef and pork liver, green tea, and soybean oil. You should eat a consistent amount of foods high in  vitamin K. Avoid major changes in your diet, or notify your caregiver before changing your diet. Arrange a visit with a dietitian to answer your questions.  Many medicines can interfere with warfarin and affect the PT and INR results. You must tell your caregiver about any and all medicines you take, this includes all vitamins and supplements. Be especially cautious with aspirin and anti-inflammatory medicines. Ask your caregiver before taking these. Do not take or discontinue any prescribed or over-the-counter medicine except on the advice of your caregiver or pharmacist.  Warfarin can have side effects, primarily excessive bruising or bleeding. You will need to hold pressure over cuts for longer than usual. Your caregiver or pharmacist will discuss other potential side effects.  Alcohol can change the body's ability to handle warfarin. It is best to avoid alcoholic drinks or consume only very small amounts while taking warfarin. Notify your caregiver if you change your alcohol intake.  Notify your dentist or other caregivers before procedures.  Activity. Ask your caregiver how soon you can go back to normal activities. It is important to stay active to prevent blood clots. If you are on anticoagulant medicine, avoid contact sports.  Exercise. It is very important to exercise. This is especially important while traveling, sitting or standing for long periods of time. Exercise your legs by walking or by pumping the muscles frequently. Take frequent walks.  Compression stockings. These are tight elastic stockings that apply pressure to the lower legs. This pressure can help keep the blood in the legs from clotting. You may need to wear compressions stockings at home to help prevent a DVT.  Smoking. If you smoke, quit. Ask your caregiver for help with quitting smoking.  Learn as much as you can about DVT. Knowing more about the condition should help you keep it from coming back.  Wear a medical  alert bracelet or carry a medical alert card. SEEK MEDICAL CARE IF:  You notice a rapid heartbeat.  You feel weaker or more tired than usual.  You feel faint.  You notice increased bruising.  You feel your symptoms are not getting better in the time expected.  You believe you are having side effects of medicine. SEEK IMMEDIATE MEDICAL CARE IF:  You have chest pain.  You have trouble breathing.  You have new or increased swelling or pain in one leg.  You cough up blood.  You notice blood in vomit, in a bowel movement, or in urine. MAKE SURE YOU:  Understand these instructions.  Will watch your condition.  Will get help right away if you are not doing well or get worse. Document Released: 07/28/2005 Document Revised: 04/21/2012 Document Reviewed: 09/19/2010 [employer] Patient Information  2014 [employer].  

## 2013-07-28 NOTE — Progress Notes (Signed)
VASCULAR LAB PRELIMINARY  PRELIMINARY  PRELIMINARY  PRELIMINARY  Left lower extremity venous Doppler completed.    Preliminary report:  There is acute, non occlusive DVT noted in the left peroneal vein.  All other veins appear thrombus free.  Geralynn Capri, RVT 07/28/2013, 4:22 PM

## 2013-07-28 NOTE — Progress Notes (Signed)
Chief Complaint  Patient presents with  . Leg Pain    and swelling since Tuesday. Also some left knee pain that has been ongoing. Flew this past Monday, Tuesday was on her feet all day and by Tuesday night noticed swelling- calf area is painful when she presses on it.    Flew back from [facility] on Monday.  She has had some pain in the left knee intermittently, and pain was worse on Tuesday (when on her feet all day at a funeral reception).  Tuesday night she noticed an indentation from her top of her sock, and she had a firm swollen area above the level of the sock, medially (upper medial calf).  No redness, but firm and very tender.  She went to bed, and by morning it was a little improved.  It is no longer hard, but remains tender.  She has been keeping it elevated mainly just at night, too busy during the day.  Last night her leg was also quite swollen by the end of the day.  Swelling is noted in the top of the foot, ankle, and up the calf; not above the knee. It hurt for the leg to even lay on the sheet last night, or if the legs touched each other.  She took tylenol, motrin, aleve but nothing seemed to help with the pain.  Past Medical History  Diagnosis Date  . Hypertension   . Elevated cholesterol   . Osteopenia     last DEXA 10/2009  . Former smoker     Quit in 1984  . Mixed stress and urge urinary incontinence     Dr. McDiarmid  . Torn meniscus     L knee   Past Surgical History  Procedure Laterality Date  . Tonsillectomy    . Total abdominal hysterectomy w/ bilateral salpingoophorectomy  04/1999  . Appendectomy  1973  . Ovarian cyst removal  1973    LEFT OVARIAN  . Incontinence surgery  04/1999    Re-do at [facility] for sling complications in 2001  . Neuroma surgery  1980's    bilateral feet  . Lateral epicondyle release      Right  . Cystocele repair  11/2010    Dr. McDiarmid  . Knee arthroscopy Right 04/05/13    Dr. Rennis Chris   History   Social History  . Marital Status:  Married    Spouse Name: N/A    Number of Children: 3  . Years of Education: N/A   Occupational History  .     Social History Main Topics  . Smoking status: Former Smoker    Quit date: 08/11/1985  . Smokeless tobacco: Never Used  . Alcohol Use: Yes     Comment: 1 glass of wine on weekends only (2-3/week)  . Drug Use: No  . Sexual Activity: Not Currently   Other Topics Concern  . Not on file   Social History Narrative   Married.  Lives with husband.  Children live in [place], [place].  Youngest child was killed in MVA.  3 stepchildren are all local. 1 dog    Current outpatient prescriptions:aspirin 81 MG tablet, Take 81 mg by mouth daily., Disp: , Rfl: ;  conjugated estrogens (PREMARIN) vaginal cream, Place vaginally 2 (two) times a week., Disp: 42.5 g, Rfl: 12;  lisinopril-hydrochlorothiazide (PRINZIDE,ZESTORETIC) 20-12.5 MG per tablet, Take 1 tablet by mouth daily., Disp: 90 tablet, Rfl: 1;  mirabegron ER (MYRBETRIQ) 50 MG TB24, Take 50 mg by mouth daily., Disp: ,  Rfl:  oxybutynin (OXYTROL) 3.9 MG/24HR, Place 1 patch onto the skin 2 (two) times a week., Disp: , Rfl: ;  simvastatin (ZOCOR) 40 MG tablet, Take 1 tablet (40 mg total) by mouth at bedtime., Disp: 90 tablet, Rfl: 1  Allergies  Allergen Reactions  . Naprosyn [Naproxen] Rash   ROS:  She denies fevers, chills, URI symptoms, headaches, dizziness, chest pain.  Denies shortness of breath, palpitations, GI complaints, or other concerns, except as stated in HPI  PHYSICAL EXAM: BP 120/70  Pulse 84  Temp(Src) 98.1 F (36.7 C) (Oral)  Ht 5\' 4"  (1.626 m)  Wt 128 lb (58.06 kg)  BMI 21.96 kg/m2 Very pleasant female, in no significant distress  Negative Homan sign Trace pretibial edema on left Tender along medial calf--without any cord There is a mildly tender, thickened vein palpable posteromedially (but her more tender area is true medial, which appears normal). 2+ pulses  ASSESSMENT/PLAN:  Acute DVT (deep venous thrombosis),  left - left peroneal vein; treat with xarelto.  stop premarin cream - Plan: Hypercoagulable panel, comprehensive, Rivaroxaban (XARELTO STARTER PACK) 15 & 20 MG TBPK  Left leg swelling - after recent plant trip.  rule out DVT - Plan: US Venous Img Lower Unilateral Left  Set up for duplex U/S of LLE If unable to get today, do stat d-dimer  If no DVT, likely has a mild superficial thrombophlebitis--use heat, NSAIDs (motrin), and keep leg elevated. Compression stocking also recommended.  ADDENDUM: Call report showed DVT in left peroneal vein. Patient returned to the office for hypercoaguable panel, and for discussion of results and treatment DDx of causes of DVT were reviewed in detail. Potential risks including PE were reviewed.  Types of anticoagulation were reviewed--given lack of CV symptoms and PE, will start xarelto, and treat as outpt.  xarelto 15mg  twice daily for 3 weeks, then once daily (30 day supply starter kit given) F/u in approx 10 days.  Keep leg elevated, wear compression stocking  40 minutes of total time spent with patient (between initial visit, and then f/u after the ultrasound); more than 1/2 of this was spent counseling.

## 2013-07-29 ENCOUNTER — Telehealth: Payer: Self-pay | Admitting: Family Medicine

## 2013-07-29 DIAGNOSIS — Z86718 Personal history of other venous thrombosis and embolism: Secondary | ICD-10-CM | POA: Diagnosis not present

## 2013-07-29 NOTE — Telephone Encounter (Signed)
Patient's husband came by concerning xarelto starter pack you gave yesterday. They were no samples in package     We did not have any more starter packs so we checked with Dr.Lalonde and gave her 15 mg samples #20 (taking one twice per day) and told husband we would send you a message to see how you would like to handle the rest of the RX

## 2013-07-29 NOTE — Telephone Encounter (Signed)
Starter pack was empty???? She needs to be on 15mg  BID x 3 weeks, followed by 20mg  once daily.  Ideally, would love to be able to provide full three weeks of 15mg  in samples, and then just rx the 20mg , send to pharmacy.  Call rep if needed to get add'l 15mg  samples if not available in office (and ask for more starter packs, that have pills in them!)  She is coming back a week from Monday, but samples given today might not last until then (if she took two doses today, and her appt is in the afternoon on 12/29)

## 2013-08-01 ENCOUNTER — Telehealth: Payer: Self-pay | Admitting: *Deleted

## 2013-08-01 ENCOUNTER — Telehealth: Payer: Self-pay | Admitting: Family Medicine

## 2013-08-01 NOTE — Telephone Encounter (Signed)
Spoke with patient and she spoke to her brother and he has history of blood clots, 3:one in lower left leg, one behind his left knee and one in his groin area. He has a natural immunity to coumadin and since he is prone to blood clots he has a Greenfield Filter in his abdomen. Also her father had something like a bypass surgery done due to a vein growing inside a vein. Just an FYI.

## 2013-08-01 NOTE — Telephone Encounter (Signed)
Spoke with patient and she would be interested in seeing a hematologist.

## 2013-08-01 NOTE — Telephone Encounter (Signed)
Ok to refer to hematologist--dx DVT with family h/o DVT's. Really no significant precipitating cause

## 2013-08-01 NOTE — Telephone Encounter (Signed)
Left message for patient to return my call.

## 2013-08-02 LAB — HYPERCOAGULABLE PANEL, COMPREHENSIVE
AntiThromb III Func: 110 % (ref 76–126)
Anticardiolipin IgG: 8 GPL U/mL (ref <23)
Anticardiolipin IgM: 3 MPL U/mL (ref <11)
Beta-2 Glyco I IgG: 4 G Units (ref <20)
Beta-2-Glycoprotein I IgM: 4 M Units (ref <20)
DRVVT: 22.1 secs (ref <42.9)
Lupus Anticoagulant: NOT DETECTED
PTT Lupus Anticoagulant: 28.6 secs (ref 28.0–43.0)
Protein C Activity: 136 % — ABNORMAL HIGH (ref 75–133)
Protein S Activity: 93 % (ref 69–129)
Protein S Total: 99 % (ref 60–150)

## 2013-08-02 NOTE — Telephone Encounter (Signed)
done

## 2013-08-03 ENCOUNTER — Other Ambulatory Visit: Payer: Self-pay | Admitting: *Deleted

## 2013-08-03 ENCOUNTER — Telehealth: Payer: Self-pay | Admitting: *Deleted

## 2013-08-03 DIAGNOSIS — I82402 Acute embolism and thrombosis of unspecified deep veins of left lower extremity: Secondary | ICD-10-CM

## 2013-08-03 NOTE — Telephone Encounter (Signed)
Put referral in system to hematology and also called over and had to leave a message when I tried to schedule.

## 2013-08-05 ENCOUNTER — Telehealth: Payer: Self-pay | Admitting: Internal Medicine

## 2013-08-05 NOTE — Telephone Encounter (Signed)
C/D 08/05/13 for appt. 08/17/13 °

## 2013-08-05 NOTE — Telephone Encounter (Signed)
LVOM FOR PT TO RETURN CALL IN RE TO REFERRAL.  °

## 2013-08-05 NOTE — Telephone Encounter (Signed)
S/w pt and gve np appt 01/07 @ 10:30 w/Dr. Rosie Fate Referring Dr. Joselyn Arrow Dx- DVT Welcome packet mailed.

## 2013-08-08 ENCOUNTER — Encounter: Payer: Self-pay | Admitting: Family Medicine

## 2013-08-08 ENCOUNTER — Ambulatory Visit (INDEPENDENT_AMBULATORY_CARE_PROVIDER_SITE_OTHER): Payer: Medicare Other | Admitting: Family Medicine

## 2013-08-08 VITALS — BP 120/64 | HR 80 | Ht 64.0 in | Wt 131.0 lb

## 2013-08-08 DIAGNOSIS — I82409 Acute embolism and thrombosis of unspecified deep veins of unspecified lower extremity: Secondary | ICD-10-CM | POA: Diagnosis not present

## 2013-08-08 DIAGNOSIS — D6851 Activated protein C resistance: Secondary | ICD-10-CM

## 2013-08-08 DIAGNOSIS — D6859 Other primary thrombophilia: Secondary | ICD-10-CM | POA: Diagnosis not present

## 2013-08-08 DIAGNOSIS — I82402 Acute embolism and thrombosis of unspecified deep veins of left lower extremity: Secondary | ICD-10-CM

## 2013-08-08 DIAGNOSIS — L821 Other seborrheic keratosis: Secondary | ICD-10-CM | POA: Diagnosis not present

## 2013-08-08 MED ORDER — RIVAROXABAN 20 MG PO TABS: 20.0000 mg | ORAL_TABLET | Freq: Every day | ORAL | Status: DC

## 2013-08-08 NOTE — Progress Notes (Signed)
Chief Complaint  Patient presents with  . follow-up    10 day follow-up   Patient presents for follow-up on her L peroneal DVT. She has been taking xarelto without complication.  Her swelling has resolved, pain resolved.  She stopped using the vaginal estrogen cream.  She has been wearing the compression stockings--they are hard to get on, but she feels really good when wearing them.  She denies any bleeding, bruising, or side effects to the xarelto.  She has appt with hematologist on January 7th, with Dr. Myra Rude.  She was found to be heterozygous for factor V Leiden mutation on hypercoag panel.  No other abnormalities found.   He brother has h/o 3 clots in his leg and has a Greenfield Comptroller.  He apparently was "immune" to coumadin.  She has also learned that her father had some sort of chest surgery, "a vein growing within a vein". Unsure if there are any other DVT's or clots in the family.  She had a pain in her right neck, which goes into her chest, and felt worse when raising her right arm.  This started on 12/26.  It seemed to get better in a few days.  2-3 days prior she had been carrying a heavy briefcase.  Pain has resolved.    She wants the back of her right earlobe looked at--hairdresser noticed something, and she would like it checked since she can't see it.  Has had something on the ear for several months.  Denies any change.  Bled after picking at it once. No ongoing bleeding.  Past Medical History  Diagnosis Date  . Hypertension   . Elevated cholesterol   . Osteopenia     last DEXA 10/2009  . Former smoker     Quit in 1984  . Mixed stress and urge urinary incontinence     Dr. McDiarmid  . Torn meniscus     L knee  . Heterozygous factor V Leiden mutation 07/2013  . DVT (deep venous thrombosis) 07/2013    L peroneal v   Past Surgical History  Procedure Laterality Date  . Tonsillectomy    . Total abdominal hysterectomy w/ bilateral salpingoophorectomy  04/1999  .  Appendectomy  1973  . Ovarian cyst removal  1973    LEFT OVARIAN  . Incontinence surgery  04/1999    Re-do at [facility] for sling complications in 2001  . Neuroma surgery  1980's    bilateral feet  . Lateral epicondyle release      Right  . Cystocele repair  11/2010    Dr. McDiarmid  . Knee arthroscopy Right 04/05/13    Dr. Rennis Chris   History   Social History  . Marital Status: Married    Spouse Name: N/A    Number of Children: 3  . Years of Education: N/A   Occupational History  .     Social History Main Topics  . Smoking status: Former Smoker    Quit date: 08/11/1985  . Smokeless tobacco: Never Used  . Alcohol Use: Yes     Comment: 1 glass of wine on weekends only (2-3/week)  . Drug Use: No  . Sexual Activity: Not Currently   Other Topics Concern  . Not on file   Social History Narrative   Married.  Lives with husband.  Children live in [place], [place].  Youngest child was killed in MVA.  3 stepchildren are all local. 1 dog   Current outpatient prescriptions:aspirin 81 MG tablet, Take  81 mg by mouth daily., Disp: , Rfl: ;  lisinopril-hydrochlorothiazide (PRINZIDE,ZESTORETIC) 20-12.5 MG per tablet, Take 1 tablet by mouth daily., Disp: 90 tablet, Rfl: 1;  mirabegron ER (MYRBETRIQ) 50 MG TB24, Take 50 mg by mouth daily., Disp: , Rfl: ;  oxybutynin (OXYTROL) 3.9 MG/24HR, Place 1 patch onto the skin 2 (two) times a week., Disp: , Rfl:  Rivaroxaban (XARELTO STARTER PACK) 15 & 20 MG TBPK, Take as directed on package: Start with one 15mg  tablet by mouth twice a day with food. On Day 22, switch to one 20mg  tablet once a day with food., Disp: 51 each, Rfl: 0;  simvastatin (ZOCOR) 40 MG tablet, Take 1 tablet (40 mg total) by mouth at bedtime., Disp: 90 tablet, Rfl: 1  Allergies  Allergen Reactions  . Naprosyn [Naproxen] Rash   ROS:  Denies fevers, chills, nausea, vomiting, bowel changes, bleeding, bruising, edema, cough, shortness of breath, URI symtpoms, or other complaints, except as noted in  HPI.  PHYSICAL EXAM: BP 120/64  Pulse 80  Ht 5\' 4"  (1.626 m)  Wt 131 lb (59.421 kg)  BMI 22.47 kg/m2 Well developed, pleasant female in no distress Examination of right earlobe: Posteriorly there is a large, mostly fleshcolored rough area, consistent with SK, measuring 13x73mm.  Small scab at inferior portion.  Laterally on the ear is a smaller SK, measuring 12 mm in length (height). 5mm in width. Neck: no lymphadenopathy or mass Heart: regular rate and rhythm Lungs: clear bilaterally Extremities:  Very mild residual tenderness at left lower leg, medially. No cord.  No edema. 2+ pulses Neuro: alert and oriented  ASSESSMENT/PLAN:  Acute DVT (deep venous thrombosis), left - Plan: Rivaroxaban (XARELTO) 20 MG TABS tablet  Heterozygous factor V Leiden mutation  Seborrheic keratoses - R earlobe, not inflamed  Reassured re: SKs on earlobe  Continue xarelto--complete 15mg  BID x 21 days, then change over to 20mg  daily.  F/u as scheduled with hematologist Counseled re: risks/side effects; avoid NSAIDs Discussed implications of factor V Leiden mutation, and ongoing risk of recurrent DVT's  F/u as scheduled in March

## 2013-08-08 NOTE — Patient Instructions (Signed)
Complete the xarelto 15mg  twice daily (for 3 weeks), then start taking 20mg  once daily.  Use tylenol as needed for pain, rather than an anti-inflammatory.  Avoid ibuprofen, aleve, aspirin, etc

## 2013-08-11 HISTORY — PX: CATARACT EXTRACTION W/ INTRAOCULAR LENS  IMPLANT, BILATERAL: SHX1307

## 2013-08-14 ENCOUNTER — Emergency Department (HOSPITAL_COMMUNITY)
Admission: EM | Admit: 2013-08-14 | Discharge: 2013-08-14 | Disposition: A | Discharge status: Home / Self Care | Payer: Medicare Other | Attending: Emergency Medicine | Admitting: Emergency Medicine

## 2013-08-14 ENCOUNTER — Encounter (HOSPITAL_COMMUNITY): Payer: Self-pay | Admitting: Emergency Medicine

## 2013-08-14 DIAGNOSIS — Z86718 Personal history of other venous thrombosis and embolism: Secondary | ICD-10-CM | POA: Insufficient documentation

## 2013-08-14 DIAGNOSIS — E7800 Pure hypercholesterolemia, unspecified: Secondary | ICD-10-CM | POA: Insufficient documentation

## 2013-08-14 DIAGNOSIS — R319 Hematuria, unspecified: Secondary | ICD-10-CM | POA: Diagnosis not present

## 2013-08-14 DIAGNOSIS — Z79899 Other long term (current) drug therapy: Secondary | ICD-10-CM | POA: Insufficient documentation

## 2013-08-14 DIAGNOSIS — Z7901 Long term (current) use of anticoagulants: Secondary | ICD-10-CM | POA: Insufficient documentation

## 2013-08-14 DIAGNOSIS — M545 Low back pain, unspecified: Secondary | ICD-10-CM | POA: Diagnosis not present

## 2013-08-14 DIAGNOSIS — M7981 Nontraumatic hematoma of soft tissue: Secondary | ICD-10-CM | POA: Diagnosis not present

## 2013-08-14 DIAGNOSIS — I10 Essential (primary) hypertension: Secondary | ICD-10-CM | POA: Insufficient documentation

## 2013-08-14 DIAGNOSIS — Z862 Personal history of diseases of the blood and blood-forming organs and certain disorders involving the immune mechanism: Secondary | ICD-10-CM | POA: Insufficient documentation

## 2013-08-14 DIAGNOSIS — Z7982 Long term (current) use of aspirin: Secondary | ICD-10-CM | POA: Insufficient documentation

## 2013-08-14 DIAGNOSIS — N390 Urinary tract infection, site not specified: Secondary | ICD-10-CM | POA: Diagnosis not present

## 2013-08-14 DIAGNOSIS — Z87891 Personal history of nicotine dependence: Secondary | ICD-10-CM | POA: Diagnosis not present

## 2013-08-14 DIAGNOSIS — Z8739 Personal history of other diseases of the musculoskeletal system and connective tissue: Secondary | ICD-10-CM | POA: Insufficient documentation

## 2013-08-14 DIAGNOSIS — M549 Dorsalgia, unspecified: Secondary | ICD-10-CM | POA: Insufficient documentation

## 2013-08-14 DIAGNOSIS — Z87828 Personal history of other (healed) physical injury and trauma: Secondary | ICD-10-CM | POA: Insufficient documentation

## 2013-08-14 DIAGNOSIS — Z9889 Other specified postprocedural states: Secondary | ICD-10-CM | POA: Diagnosis not present

## 2013-08-14 DIAGNOSIS — I998 Other disorder of circulatory system: Secondary | ICD-10-CM | POA: Diagnosis not present

## 2013-08-14 LAB — CBC
HCT: 35.8 % — ABNORMAL LOW (ref 36.0–46.0)
Hemoglobin: 12.2 g/dL (ref 12.0–15.0)
MCH: 33.3 pg (ref 26.0–34.0)
MCHC: 34.1 g/dL (ref 30.0–36.0)
MCV: 97.8 fL (ref 78.0–100.0)
PLATELETS: 242 10*3/uL (ref 150–400)
RBC: 3.66 MIL/uL — ABNORMAL LOW (ref 3.87–5.11)
RDW: 12.6 % (ref 11.5–15.5)
WBC: 7.3 10*3/uL (ref 4.0–10.5)

## 2013-08-14 LAB — COMPREHENSIVE METABOLIC PANEL
ALBUMIN: 3.9 g/dL (ref 3.5–5.2)
ALT: 22 U/L (ref 0–35)
AST: 21 U/L (ref 0–37)
Alkaline Phosphatase: 62 U/L (ref 39–117)
BUN: 18 mg/dL (ref 6–23)
CO2: 28 meq/L (ref 19–32)
CREATININE: 0.79 mg/dL (ref 0.50–1.10)
Calcium: 9.7 mg/dL (ref 8.4–10.5)
Chloride: 101 mEq/L (ref 96–112)
GFR calc Af Amer: >90 mL/min (ref >90)
GFR calc non Af Amer: 84 mL/min — ABNORMAL LOW (ref >90)
Glucose, Bld: 118 mg/dL — ABNORMAL HIGH (ref 70–99)
Potassium: 3.8 mEq/L (ref 3.7–5.3)
Sodium: 141 mEq/L (ref 137–147)
Total Bilirubin: 0.3 mg/dL (ref 0.3–1.2)
Total Protein: 7.7 g/dL (ref 6.0–8.3)

## 2013-08-14 LAB — PROTIME-INR
INR: 2.96 — AB (ref 0.00–1.49)
PROTHROMBIN TIME: 29.8 s — AB (ref 11.6–15.2)

## 2013-08-14 LAB — URINALYSIS, ROUTINE W REFLEX MICROSCOPIC
Glucose, UA: NEGATIVE mg/dL
Ketones, ur: 40 mg/dL — AB
NITRITE: POSITIVE — AB
Protein, ur: >300 mg/dL — AB
SPECIFIC GRAVITY, URINE: 1.028 (ref 1.005–1.030)
UROBILINOGEN UA: 1 mg/dL (ref 0.0–1.0)
pH: 5 (ref 5.0–8.0)

## 2013-08-14 LAB — URINE MICROSCOPIC-ADD ON

## 2013-08-14 MED ORDER — CEPHALEXIN 500 MG PO CAPS: 500.0000 mg | ORAL_CAPSULE | Freq: Three times a day (TID) | ORAL | Status: DC

## 2013-08-14 NOTE — ED Provider Notes (Signed)
CSN: [id number]     Arrival date & time 08/14/13  0039 History   None    Chief Complaint  Patient presents with  . Hematuria   (Consider location/radiation/quality/duration/timing/severity/associated sxs/prior Treatment) The history is provided by the patient and medical records.   This is a 68 year old female with past medical history significant for hypertension, hyperlipidemia, factor V Leiden mutation, and recent DVT, presenting to the ED for hematuria. Patient had DVT on 07/28/2013, was started on xarelto 20 mg daily.  Pt also takes daily 81mg  ASA.  States last night she had episodes of frank hematuria.  Denies urinary sx or fever.  Pt also notes easy bruising over the past few days, notably on right hand and left foot.  No active bleeding from lacerations or abrasions.  No abdominal pain, nausea, vomiting, or diarrhea.  No dizziness, weakness, palpitations, or feelings of syncope.  Xarelto managed by PCP-- Dr. Tomi Bamberger.  Pt also complains of some mild back pain.  States she has been taking down her Christmas decorations at home and thinks she might have "strained" her back.  No falls or direct trauma to the back.  No numbness or paresthesias of LE.  No loss of bowel or bladder control.  Has applied heat to back and taken tylenol with some improvement of sx.  Past Medical History  Diagnosis Date  . Hypertension   . Elevated cholesterol   . Osteopenia     last DEXA 10/2009  . Former smoker     Quit in 1984  . Mixed stress and urge urinary incontinence     Dr. McDiarmid  . Torn meniscus     L knee  . Heterozygous factor V Leiden mutation 07/2013  . DVT (deep venous thrombosis) 07/2013    L peroneal v   Past Surgical History  Procedure Laterality Date  . Tonsillectomy    . Total abdominal hysterectomy w/ bilateral salpingoophorectomy  04/1999  . Appendectomy  1973  . Ovarian cyst removal  1973    LEFT OVARIAN  . Incontinence surgery  04/1999    Re-do at [facility] for sling complications  in 3762  . Neuroma surgery  1980's    bilateral feet  . Lateral epicondyle release      Right  . Cystocele repair  11/2010    Dr. McDiarmid  . Knee arthroscopy Right 04/05/13    Dr. Onnie Graham   Family History  Problem Relation Age of Onset  . Hypertension Father   . Heart disease Father 26    CABG, died from MI at 53  . COPD Father   . Cirrhosis Mother     related to psoriasis medications  . Diabetes Mother   . Osteogenesis imperfecta Sister   . Heart disease Sister     CHF  . Diabetes Brother   . Heart disease Brother 97    MI  . Cancer Neg Hx    History  Substance Use Topics  . Smoking status: Former Smoker    Quit date: 08/11/1985  . Smokeless tobacco: Never Used  . Alcohol Use: Yes     Comment: 1 glass of wine on weekends only (2-3/week)   OB History   Grav Para Term Preterm Abortions TAB SAB Ect Mult Living   3 3        2      Review of Systems  Genitourinary: Positive for hematuria.  Musculoskeletal: Positive for back pain.  All other systems reviewed and are negative.    Allergies  Naprosyn  Home Medications   Current Outpatient Rx  Name  Route  Sig  Dispense  Refill  . aspirin 81 MG tablet   Oral   Take 81 mg by mouth daily.         [name] lisinopril-hydrochlorothiazide (PRINZIDE,ZESTORETIC) 20-12.5 MG per tablet   Oral   Take 1 tablet by mouth daily.   90 tablet   1   . mirabegron ER (MYRBETRIQ) 50 MG TB24   Oral   Take 50 mg by mouth daily.         [name] oxybutynin (OXYTROL) 3.9 MG/24HR   Transdermal   Place 1 patch onto the skin See admin instructions. Change after 4 days         . Rivaroxaban (XARELTO) 20 MG TABS tablet   Oral   Take 1 tablet (20 mg total) by mouth daily with supper.   30 tablet   2   . simvastatin (ZOCOR) 40 MG tablet   Oral   Take 1 tablet (40 mg total) by mouth at bedtime.   90 tablet   1    BP 133/59  Pulse 72  Temp(Src) 98.1 F (36.7 C) (Oral)  Resp 12  Ht 5\' 4"  (1.626 m)  Wt 130 lb (58.968 kg)  BMI  22.30 kg/m2  SpO2 100%  Physical Exam  Nursing note and vitals reviewed. Constitutional: She is oriented to person, place, and time. She appears well-developed and well-nourished. No distress.  HENT:  Head: Normocephalic and atraumatic.  Mouth/Throat: Oropharynx is clear and moist.  Eyes: Conjunctivae and EOM are normal. Pupils are equal, round, and reactive to light.  Neck: Normal range of motion. Neck supple.  Cardiovascular: Normal rate, regular rhythm and normal heart sounds.   Pulmonary/Chest: Effort normal and breath sounds normal. No respiratory distress. She has no wheezes.  Abdominal: Soft. Bowel sounds are normal. There is no tenderness. There is no guarding.  Musculoskeletal: Normal range of motion. She exhibits no edema.       Lumbar back: She exhibits tenderness and pain. She exhibits normal range of motion, no bony tenderness, no swelling, no edema, no deformity, no laceration, no spasm and normal pulse.  Pain of lumbar paraspinal areas without focal TTP; no mid-line TTP, step-off, or deformity; distal sensation intact  Neurological: She is alert and oriented to person, place, and time.  Skin: Skin is warm and dry. Bruising noted. She is not diaphoretic.  Bruising to right index finger and lateral left foot; no swelling or deformities noted  Psychiatric: She has a normal mood and affect.    ED Course  Procedures (including critical care time) Labs Review Labs Reviewed  CBC - Abnormal; Notable for the following:    RBC 3.66 (*)    HCT 35.8 (*)    All other components within normal limits  COMPREHENSIVE METABOLIC PANEL - Abnormal; Notable for the following:    Glucose, Bld 118 (*)    GFR calc non Af Amer 84 (*)    All other components within normal limits  URINALYSIS, ROUTINE W REFLEX MICROSCOPIC - Abnormal; Notable for the following:    Color, Urine RED (*)    APPearance TURBID (*)    Hgb urine dipstick LARGE (*)    Bilirubin Urine LARGE (*)    Ketones, ur 40 (*)     Protein, ur >300 (*)    Nitrite POSITIVE (*)    Leukocytes, UA LARGE (*)    All other components within normal limits  PROTIME-INR -  Abnormal; Notable for the following:    Prothrombin Time 29.8 (*)    INR 2.96 (*)    All other components within normal limits  URINE MICROSCOPIC-ADD ON - Abnormal; Notable for the following:    Bacteria, UA FEW (*)    All other components within normal limits   Imaging Review No results found.  EKG Interpretation   None       MDM   1. UTI (lower urinary tract infection)   2. Hematuria    On talking with pt, she states that her xarelto dose is 15mg  BID-- unsure if taking correct dose at home.  U/a here with large amount of blood and nitrite +, culture pending.  Hemoglobin 12.2 today, no current symptoms concerning for blood loss anemia, VS remain stable.  Back pain without signs/sx concerning for cauda equina.  Hematuria likely from new use of xarelto, possible unintentional overdose.  Advised to continue taking xarelto as directed until PCP FU, will start on course of keflex for UTI.  Discussed plan with pt and wife, they agreed.  Strict return precautions advised for active bleeding, dizziness, weakness, etc.  Larene Pickett, PA-C 08/14/13 [place], PA-C 08/14/13 1505

## 2013-08-14 NOTE — ED Notes (Signed)
07/28/13: dx. DVT in lower left leg. Factor 5.  Taking Xarelto 15 mg.  [place]: hematuria (alot).  Lower back pain.

## 2013-08-14 NOTE — Discharge Instructions (Signed)
Take the prescribed medication as directed.  Your urine culture is pending-- if it shows resistance to your antibiotic you will be notified and new medication will be prescribed for you. Follow-up with Dr. Tomi Bamberger tomorrow for re-check and discuss this ED visit. Return to the ED for new or worsening symptoms-- severe blood loss, active bleeding, generalized weakness, palpitations, dizziness, etc.

## 2013-08-15 ENCOUNTER — Telehealth: Payer: Self-pay | Admitting: Family Medicine

## 2013-08-15 NOTE — Telephone Encounter (Signed)
Pt called and stated that she had to go to ER over the weekend because she was urinating blood. She states she was diagnosed with a uti. She states she is still taking the higher dose of xarelto. The instructions on pack was high dose for 21 days and she states that will be up on January the ninth. She also states that she has NOT taken this medication since Sat night at 7.00. So no dose Sunday and not yet today. Pt is not experiencing any lightheadedness and bleeding has stopped since stopping the medication. Please call pt and inform of how to proceed. Pt can be reached at [phone].

## 2013-08-15 NOTE — Telephone Encounter (Signed)
Completing phone conversation from earlier (15 minute phone conversation, at 1:30 pm).  She started bleeding Saturday night, lots of blood into the toilet.  She called Dr. Redmond School, but after getting no response, went to the ER.  She hasn't taken the blood thinner since the bleeding started.  She wasn't able to get the antibiotic filled for the UTI until Sunday afternoon, and the bleeding had already stopped by then.  She never had any urinary frequency, urgency, dysuria or abdominal pain, just the frank blood (which stopped prior to starting ABX). She read info on xarelto, and said to stop med and contact doctor if any pink urine or blood in urine.  She isn't having any other bleeding.  She isn't having light headedness, dizziness. Her Hg in ER was normal.  Preliminary urine culture results were reviewed with pt, showing >100K E.coli, sensitivity is pending. Again, she has no symptoms.  Discussed restarting xarelto now that ABX for UTI are on board (which might have been contributing to the reason she bled, in addition to being on blood thinners).  She was very hesitant to consider restarting med.  We discussed the alternatives--if she doesn't take anything to treat the DVT, there is the risk for PE, vs other blood thinners that also will carry risks of bleeding.  We also suggested starting back at the lower dose (she still has a few more days to complete the 21 d at 15mg  BID), but she elects to hold off on restarting xarelto until she sees hematologist on Wednesday. Risks reviewed.  Pt had many questions, all of which were answered to the best of my ability (15 min phone call). Will contact pt when sensitivities back

## 2013-08-15 NOTE — Telephone Encounter (Signed)
Bleeding stopped when she stopped taking the xarelto.  She states this was prior to her picking up her rx

## 2013-08-15 NOTE — ED Provider Notes (Signed)
Medical screening examination/treatment/procedure(s) were performed by non-physician practitioner and as supervising physician I was immediately available for consultation/collaboration.  EKG Interpretation   None         Julianne Rice, MD 08/15/13 0110

## 2013-08-16 LAB — URINE CULTURE

## 2013-08-16 NOTE — Telephone Encounter (Signed)
Please advise pt that the urine culture results are now back-- the sensitivities show that the antibiotic she was put on by the ER was the correct antibiotic to treat the urinary infection

## 2013-08-17 ENCOUNTER — Other Ambulatory Visit: Payer: Self-pay | Admitting: Internal Medicine

## 2013-08-17 ENCOUNTER — Ambulatory Visit (HOSPITAL_BASED_OUTPATIENT_CLINIC_OR_DEPARTMENT_OTHER): Payer: Medicare Other | Admitting: Internal Medicine

## 2013-08-17 ENCOUNTER — Other Ambulatory Visit (HOSPITAL_BASED_OUTPATIENT_CLINIC_OR_DEPARTMENT_OTHER): Payer: Medicare Other

## 2013-08-17 ENCOUNTER — Encounter: Payer: Self-pay | Admitting: Internal Medicine

## 2013-08-17 ENCOUNTER — Ambulatory Visit: Payer: Medicare Other

## 2013-08-17 VITALS — BP 120/55 | HR 66 | Temp 97.9°F | Resp 18 | Ht 64.0 in | Wt 131.5 lb

## 2013-08-17 DIAGNOSIS — A498 Other bacterial infections of unspecified site: Secondary | ICD-10-CM

## 2013-08-17 DIAGNOSIS — Z7901 Long term (current) use of anticoagulants: Secondary | ICD-10-CM | POA: Diagnosis not present

## 2013-08-17 DIAGNOSIS — N390 Urinary tract infection, site not specified: Secondary | ICD-10-CM | POA: Diagnosis not present

## 2013-08-17 DIAGNOSIS — D6859 Other primary thrombophilia: Secondary | ICD-10-CM | POA: Diagnosis not present

## 2013-08-17 DIAGNOSIS — I82402 Acute embolism and thrombosis of unspecified deep veins of left lower extremity: Secondary | ICD-10-CM

## 2013-08-17 DIAGNOSIS — D6851 Activated protein C resistance: Secondary | ICD-10-CM

## 2013-08-17 DIAGNOSIS — R319 Hematuria, unspecified: Secondary | ICD-10-CM

## 2013-08-17 DIAGNOSIS — I82409 Acute embolism and thrombosis of unspecified deep veins of unspecified lower extremity: Secondary | ICD-10-CM

## 2013-08-17 LAB — COMPREHENSIVE METABOLIC PANEL (CC13)
ALBUMIN: 4 g/dL (ref 3.5–5.0)
ALT: 23 U/L (ref 0–55)
ANION GAP: 9 meq/L (ref 3–11)
AST: 20 U/L (ref 5–34)
Alkaline Phosphatase: 67 U/L (ref 40–150)
BUN: 20.8 mg/dL (ref 7.0–26.0)
CALCIUM: 9.5 mg/dL (ref 8.4–10.4)
CO2: 29 meq/L (ref 22–29)
Chloride: 103 mEq/L (ref 98–109)
Creatinine: 0.8 mg/dL (ref 0.6–1.1)
GLUCOSE: 99 mg/dL (ref 70–140)
POTASSIUM: 3.8 meq/L (ref 3.5–5.1)
Sodium: 142 mEq/L (ref 136–145)
Total Bilirubin: 0.46 mg/dL (ref 0.20–1.20)
Total Protein: 7.4 g/dL (ref 6.4–8.3)

## 2013-08-17 LAB — CBC WITH DIFFERENTIAL/PLATELET
BASO%: 0.7 % (ref 0.0–2.0)
BASOS ABS: 0.1 10*3/uL (ref 0.0–0.1)
EOS ABS: 0.2 10*3/uL (ref 0.0–0.5)
EOS%: 2.5 % (ref 0.0–7.0)
HCT: 34.6 % — ABNORMAL LOW (ref 34.8–46.6)
HEMOGLOBIN: 11.8 g/dL (ref 11.6–15.9)
LYMPH%: 37.1 % (ref 14.0–49.7)
MCH: 33.6 pg (ref 25.1–34.0)
MCHC: 34.2 g/dL (ref 31.5–36.0)
MCV: 98.1 fL (ref 79.5–101.0)
MONO#: 0.9 10*3/uL (ref 0.1–0.9)
MONO%: 12 % (ref 0.0–14.0)
NEUT#: 3.6 10*3/uL (ref 1.5–6.5)
NEUT%: 47.7 % (ref 38.4–76.8)
Platelets: 233 10*3/uL (ref 145–400)
RBC: 3.52 10*6/uL — ABNORMAL LOW (ref 3.70–5.45)
RDW: 12.6 % (ref 11.2–14.5)
WBC: 7.6 10*3/uL (ref 3.9–10.3)
lymph#: 2.8 10*3/uL (ref 0.9–3.3)

## 2013-08-17 NOTE — Patient Instructions (Signed)
Rivaroxaban oral tablets What is this medicine? RIVAROXABAN (ri va ROX a ban) is an anticoagulant (blood thinner). It is used to treat blood clots in the lungs or in the veins. It is also used after knee or hip surgeries to prevent blood clots. It is also used to lower the chance of stroke in people with a medical condition called atrial fibrillation. This medicine may be used for other purposes; ask your health care provider or pharmacist if you have questions. COMMON BRAND NAME(S): Xarelto What should I tell my health care provider before I take this medicine? They need to know if you have any of these conditions: -bleeding disorders -bleeding in the brain -blood in your stools (black or tarry stools) or if you have blood in your vomit -history of stomach bleeding -kidney disease -liver disease -low blood counts, like low white cell, platelet, or red cell counts -recent or planned spinal or epidural procedure -take medicines that treat or prevent blood clots -an unusual or allergic reaction to rivaroxaban, other medicines, foods, dyes, or preservatives -pregnant or trying to get pregnant -breast-feeding How should I use this medicine? Take this medicine by mouth with a glass of water. Follow the directions on the prescription label. Take your medicine at regular intervals. Do not take it more often than directed. Do not stop taking except on your doctor's advice. Stopping this medicine may increase your risk of a blot clot. Be sure to refill your prescription before you run out of medicine. If you are taking this medicine after hip or knee replacement surgery, take it with or without food. If you are taking this medicine for atrial fibrillation, take it with your evening meal. If you are taking this medicine to treat blood clots, take it with food at the same time each day. If you are unable to swallow your tablet, you may crush the tablet and mix it in applesauce. Then, immediately eat the  applesauce. You should eat more food right after you eat the applesauce containing the crushed tablet. Talk to your pediatrician regarding the use of this medicine in children. Special care may be needed. Overdosage: If you think you have taken too much of this medicine contact a poison control center or emergency room at once. NOTE: This medicine is only for you. Do not share this medicine with others. What if I miss a dose? If you take your medicine once a day and miss a dose, take the missed dose as soon as you remember. If you take your medicine twice a day and miss a dose, take the missed dose immediately. In this instance, 2 tablets may be taken at the same time. The next day you should take 1 tablet twice a day as directed. What may interact with this medicine? -aspirin and aspirin-like medicines -certain antibiotics like erythromycin, azithromycin, and clarithromycin -certain medicines for fungal infections like ketoconazole and itraconazole -certain medicines for irregular heart beat like amiodarone, quinidine, dronedarone -certain medicines for seizures like carbamazepine, phenytoin -certain medicines that treat or prevent blood clots like warfarin, enoxaparin, and dalteparin  -conivaptan -diltiazem -felodipine -indinavir -lopinavir; ritonavir -NSAIDS, medicines for pain and inflammation, like ibuprofen or naproxen -ranolazine -rifampin -ritonavir -St. John's wort -verapamil This list may not describe all possible interactions. Give your health care provider a list of all the medicines, herbs, non-prescription drugs, or dietary supplements you use. Also tell them if you smoke, drink alcohol, or use illegal drugs. Some items may interact with your medicine. What should I   watch for while using this medicine? Visit your doctor or health care professional for regular checks on your progress. Your condition will be monitored carefully while you are receiving this medicine. If you are  going to have surgery, tell your doctor or health care professional that you are taking this medicine. Avoid sports and activities that might cause injury while you are using this medicine. Severe falls or injuries can cause unseen bleeding. Be careful when using sharp tools or knives. Consider using an Copy. Take special care brushing or flossing your teeth. Report any injuries, bruising, or red spots on the skin to your doctor or health care professional. What side effects may I notice from receiving this medicine? Side effects that you should report to your doctor or health care professional as soon as possible: -allergic reactions like skin rash, itching or hives, swelling of the face, lips, or tongue -back pain -confusion, trouble speaking or understanding -redness, blistering, peeling or loosening of the skin, including inside the mouth -signs and symptoms of bleeding such as bloody or black, tarry stools; red or dark-brown urine; spitting up blood or brown material that looks like coffee grounds; red spots on the skin; unusual bruising or bleeding from the eye, gums, or nose -signs and symptoms of a blood clot such as breathing problems; changes in vision; chest pain; severe, sudden headache; pain, swelling, warmth in the leg; trouble speaking; sudden numbness or weakness of the face, arm, or leg -trouble walking, dizziness, loss of balance or coordination  Side effects that usually do not require medical attention (Report these to your doctor or health care professional if they continue or are bothersome.): -dizziness -muscle pain This list may not describe all possible side effects. Call your doctor for medical advice about side effects. You may report side effects to FDA at 1-800-FDA-1088. Where should I keep my medicine? Keep out of the reach of children. Store at room temperature between 15 and 30 degrees C (59 and 86 degrees F). Throw away any unused medicine after the  expiration date. NOTE: This sheet is a summary. It may not cover all possible information. If you have questions about this medicine, talk to your doctor, pharmacist, or health care provider.  2014, [employer]. (2013-01-12 09:51:31) [place] [address]  This test is done to determine whether you have an inherited gene mutation that increases your risk of developing venous blood clots. This test is done when you have had an unexplained clotting episode, especially when you are relatively young (less than 11 years old) and have no other identified risk factors. PREPARATION FOR TEST A blood sample is obtained by inserting a needle into a vein in the arm. NORMAL FINDINGS No genetic defect is found (negative). Ranges for normal findings may vary among different laboratories and hospitals. You should always check with your doctor after having lab work or other tests done to discuss the meaning of your test results and whether your values are considered within normal limits. MEANING OF TEST  Your caregiver will go over the test results with you and discuss the importance and meaning of your results, as well as treatment options and the need for additional tests if necessary. OBTAINING THE TEST RESULTS  It is your responsibility to obtain your test results. Ask the lab or department performing the test when and how you will get your results. Document Released: 08/30/2004 Document Revised: 10/20/2011 Document Reviewed: 07/07/2008 [facility] Patient Information 2014 [place], [place]. Deep Vein Thrombosis A  deep vein thrombosis (DVT) is a blood clot that develops in a deep vein. A DVT is a clot in the deep, larger veins of the leg, arm, or pelvis. These are more dangerous than clots that might form in veins near the surface of the body. A DVT can lead to complications if the clot breaks off and travels in the bloodstream to the lungs.  A DVT can damage the valves in your leg veins, so that  instead of flowing upwards, the blood pools in the lower leg. This is called post-thrombotic syndrome, and can result in pain, swelling, discoloration, and sores on the leg. Once identified, a DVT can be treated. It can also be prevented in some circumstances. Once you have had a DVT, you may be at increased risk for a DVT in the future. CAUSES Blood clots form in a vein for different reasons. Usually several things contribute to blood clots. Contributing factors include:  The flow of blood slows down.  The inside of the vein is damaged in some way.  The person has a condition that makes blood clot more easily. Some people are more likely than others to develop blood clots. That is because they have more factors that make clots likely. These are called risk factors. Risk factors include:   Older age, especially over 52 years old.  Having a history of blood clots. This means you have had one before. Or, it means that someone else in your family has had blood clots. You may have a genetic tendency to form clots.  Having major or lengthy surgery. This is especially true for surgery on the hip, knee, or belly (abdomen). Hip surgery is particularly high risk.  Breaking a hip or leg.  Sitting or lying still for a long time. This includes long distance travel, paralysis, or recovery from an illness or surgery.  Cancer, or cancer treatment.  Having a long, thin tube (catheter) placed inside a vein during a medical procedure.  Being overweight (obese).  Pregnancy and childbirth. Hormone changes make the blood clot more easily during pregnancy. The fetus puts pressure on the veins of the pelvis. There is also risk of injury to veins during delivery or a caesarean. The risk is at its highest just after childbirth.  Medicines with the female hormone estrogen. This includes birth control pills and hormone replacement therapy.  Smoking.  Other circulation or heart problems. SYMPTOMS When a clot  forms, it can either partially or totally block the blood flow in that vein. Symptoms of a DVT can include:  Swelling of the leg or arm, especially if one side is much worse.  Warmth and redness of the leg or arm, especially if one side is much worse.  Pain in an arm or leg. If the clot is in the leg, symptoms may be more noticeable or worse when standing or walking. The symptoms of a DVT that has traveled to the lungs (pulmonary embolism, PE) usually start suddenly, and include:  Shortness of breath.  Coughing.  Coughing up blood or blood-tinged phlegm.  Chest pain. The chest pain is often worse with deep breaths.  Rapid heartbeat. Anyone with these symptoms should get emergency medical treatment right away. Call your local emergency services (911 in [place] if you have these symptoms. DIAGNOSIS If a DVT is suspected, your caregiver will take a full medical history and carry out a physical exam. Tests that also may be required include:  Blood tests, including studies of the clotting properties  of the blood.  Ultrasonography to see if you have clots in your legs or lungs.  X-rays to show the flow of blood when dye is injected into the veins (venography).  Studies of your lungs, if you have any chest symptoms. PREVENTION  Exercise the legs regularly. Take a brisk 30 minute walk every day.  Maintain a weight that is appropriate for your height.  Avoid sitting or lying in bed for long periods of time without moving your legs.  Women, particularly those over the age of 22, should consider the risks and benefits of taking estrogen medicines, including birth control pills.  Do not smoke, especially if you take estrogen medicines.  Long distance travel can increase your risk of DVT. You should exercise your legs by walking or pumping the muscles every hour.  In-hospital prevention:  Many of the risk factors above relate to situations that exist with hospitalization, either for  illness, injury, or elective surgery.  Your caregiver will assess you for the need for venous thromboembolism prophylaxis when you are admitted to the hospital. If you are having surgery, your surgeon will assess you the day of or day after surgery.  Prevention may include medical and nonmedical measures. TREATMENT Treatment for DVT helps prevent death and disability. The most common treatment for DVT is blood thinning (anticoagulant) medicine, which reduces the blood's tendency to clot. Anticoagulants can stop new blood clots from forming and old ones from growing. They cannot dissolve existing clots. Your body does this by itself over time. Anticoagulants can be given by mouth, by intravenous (IV) access, or by injection. Your caregiver will determine the best program for you.  Heparin or related medicines (low molecular weight heparin) are usually the first treatment for a blood clot. They act quickly. However, they cannot be taken orally.  Heparin can cause a fall in a component of blood that stops bleeding and forms blood clots (platelets). You will be monitored with blood tests to be sure this does not occur.  Warfarin is an anticoagulant that can be swallowed (taken orally). It takes a few days to start working, so usually heparin or related medicines are used in combination. Once warfarin is working, heparin is usually stopped.  Less commonly, clot dissolving drugs (thrombolytics) are used to dissolve a DVT. They carry a high risk of bleeding, so they are used mainly in severe cases, where a life or limb is threatened.  Very rarely, a blood clot in the leg needs to be removed surgically.  If you are unable to take anticoagulants, your caregiver may arrange for you to have a filter placed in a main vein in your belly (abdomen). This filter prevents clots from traveling to your lungs. HOME CARE INSTRUCTIONS  Take all medicines prescribed by your caregiver. Follow the directions  carefully.  Warfarin. Most people will continue taking warfarin after hospital discharge. Your caregiver will advise you on the length of treatment (usually 3 6 months, sometimes lifelong).  Too much and too little warfarin are both dangerous. Too much warfarin increases the risk of bleeding. Too little warfarin continues to allow the risk for blood clots. While taking warfarin, you will need to have regular blood tests to measure your blood clotting time. These blood tests usually include both the prothrombin time (PT) and international normalized ratio (INR) tests. The PT and INR results allow your caregiver to adjust your dose of warfarin. The dose can change for many reasons. It is critically important that you take warfarin  exactly as prescribed, and that you have your PT and INR levels drawn exactly as directed.  Many foods, especially foods high in vitamin K can interfere with warfarin and affect the PT and INR results. Foods high in vitamin K include spinach, kale, broccoli, cabbage, collard and turnip greens, brussels sprouts, peas, cauliflower, seaweed, and parsley as well as beef and pork liver, green tea, and soybean oil. You should eat a consistent amount of foods high in vitamin K. Avoid major changes in your diet, or notify your caregiver before changing your diet. Arrange a visit with a dietitian to answer your questions.  Many medicines can interfere with warfarin and affect the PT and INR results. You must tell your caregiver about any and all medicines you take, this includes all vitamins and supplements. Be especially cautious with aspirin and anti-inflammatory medicines. Ask your caregiver before taking these. Do not take or discontinue any prescribed or over-the-counter medicine except on the advice of your caregiver or pharmacist.  Warfarin can have side effects, primarily excessive bruising or bleeding. You will need to hold pressure over cuts for longer than usual. Your caregiver  or pharmacist will discuss other potential side effects.  Alcohol can change the body's ability to handle warfarin. It is best to avoid alcoholic drinks or consume only very small amounts while taking warfarin. Notify your caregiver if you change your alcohol intake.  Notify your dentist or other caregivers before procedures.  Activity. Ask your caregiver how soon you can go back to normal activities. It is important to stay active to prevent blood clots. If you are on anticoagulant medicine, avoid contact sports.  Exercise. It is very important to exercise. This is especially important while traveling, sitting or standing for long periods of time. Exercise your legs by walking or by pumping the muscles frequently. Take frequent walks.  Compression stockings. These are tight elastic stockings that apply pressure to the lower legs. This pressure can help keep the blood in the legs from clotting. You may need to wear compressions stockings at home to help prevent a DVT.  Smoking. If you smoke, quit. Ask your caregiver for help with quitting smoking.  Learn as much as you can about DVT. Knowing more about the condition should help you keep it from coming back.  Wear a medical alert bracelet or carry a medical alert card. SEEK MEDICAL CARE IF:  You notice a rapid heartbeat.  You feel weaker or more tired than usual.  You feel faint.  You notice increased bruising.  You feel your symptoms are not getting better in the time expected.  You believe you are having side effects of medicine. SEEK IMMEDIATE MEDICAL CARE IF:  You have chest pain.  You have trouble breathing.  You have new or increased swelling or pain in one leg.  You cough up blood.  You notice blood in vomit, in a bowel movement, or in urine. MAKE SURE YOU:  Understand these instructions.  Will watch your condition.  Will get help right away if you are not doing well or get worse. Document Released: 07/28/2005  Document Revised: 04/21/2012 Document Reviewed: 09/19/2010 [facility] Patient Information 2014 [place].

## 2013-08-17 NOTE — Progress Notes (Signed)
Checked in new pt with no financial concerns. °

## 2013-08-17 NOTE — Telephone Encounter (Signed)
I left the patient a voicemail. CLS

## 2013-08-18 ENCOUNTER — Telehealth: Payer: Self-pay | Admitting: Internal Medicine

## 2013-08-18 ENCOUNTER — Encounter: Payer: Self-pay | Admitting: Internal Medicine

## 2013-08-18 NOTE — Telephone Encounter (Signed)
lvm for pt regarding to Feb adn March appts.Marland KitchenMarland Kitchen

## 2013-08-18 NOTE — Progress Notes (Signed)
[place] [phone]   [phone]) [phone]  NEW PATIENT EVALUATION   Name: Dorothy Rojas Date: 08/18/2013 MRN: [id number] DOB: 1946-06-06  PCP: [name], MD   REFERRING PHYSICIAN: [name], MD  REASON FOR REFERRAL:  Left leg DVT (07/28/2013) and heterozygous for factor V mutation    HISTORY OF PRESENT ILLNESS:Dorothy Rojas is a 68 y.o. female who is referred by her PCP Dr. Tomi Bamberger for further evaluation and management for her Left leg DVT (07/28/2013) and heterozygous for factor V mutation.  Patient is a reliable historian. Of, note she also has a significant history of hypertension, hypercholesteremia and recent urinary tract infection complicated by hematuria.   She reported to the emergency room on 07/28/2013 secondary to left leg pain and swelling. She reports 3 days prior to this presentation that she flew back from [place] on a one-hour flight. She noticed that she had some pain in the left knee intermittently with th pain being worse on Tuesday or 2 days prior to presentation.  On that evening, she noticed an indentation from the top of her sock and a really firm swollen area above the level of sock medially involving the upper medial medial calf. She denies any redness at that time.  In addition, she noticed that it felt firm and was very tender. So she went to bed and that evening. By the morning,  one day prior to admission she had little improvement. She noted that it was hard and remain very tender. She took Tylenol, Motrin and Aleve without any relief of his pain.  Due to persistence and pain and swelling of her left leg, she reported to the emergency room for further evaluation on 07/28/2013 duplex ultrasound of the lower left extremity revealed deep venous thrombosis of left peroneal vein. She was started on Xalreto 15 mg bid.  A hypercoagulable work-up was obtained revealing that she was had heterozygousity for factor V mutation and the  remaining was negative.  She was referred to our office for further evaluation and management.   Today, she reports that her left leg pain has nearly resolved.  Two days ago, she started xalreto 20 mg daily.  She reports that on 08/13/2013 she had a urinary tract infection complicated by frank hematuria that resolved with antibiotic treatment.  She denies any bleeding.  She denies prior clots.  She has had three pregnancies without associated blood clots.  She reports that her older brother has had 3 clots over the past several years.  She attributes these clots to left leg injury.  He has required an IVC filter because he was not a candidate for anti-coagulation.  She is up-to-date for her screening exams including a colonoscopy in 2006, mammogram in 2014 which was negative.    PAST MEDICAL HISTORY:  has a past medical history of Hypertension; Elevated cholesterol; Osteopenia; Former smoker; Mixed stress and urge urinary incontinence; Torn meniscus; Heterozygous factor V Leiden mutation (07/2013); and DVT (deep venous thrombosis) (07/2013).     PAST SURGICAL HISTORY: Past Surgical History  Procedure Laterality Date  . Tonsillectomy    . Total abdominal hysterectomy w/ bilateral salpingoophorectomy  04/1999  . Appendectomy  1973  . Ovarian cyst removal  1973    LEFT OVARIAN  . Incontinence surgery  04/1999    Re-do at [facility] for sling complications in [id number]  . Neuroma surgery  1980's    bilateral feet  . Lateral epicondyle release  Right  . Cystocele repair  11/2010    Dr. McDiarmid  . Knee arthroscopy Right 04/05/13    Dr. Onnie Graham   CURRENT MEDICATIONS: has a current medication list which includes the following prescription(s): aspirin, cephalexin, lisinopril-hydrochlorothiazide, mirabegron er, oxybutynin, rivaroxaban, and simvastatin.   ALLERGIES: Naprosyn   SOCIAL HISTORY:  reports that she quit smoking about 28 years ago. She has never used smokeless tobacco. She reports that she  drinks alcohol. She reports that she does not use illicit drugs.   FAMILY HISTORY: family history includes COPD in her father; Cirrhosis in her mother; Diabetes in her brother and mother; Heart disease in her sister; Heart disease (age of onset: 48) in her father; Heart disease (age of onset: 71) in her brother; Hypertension in her father; Osteogenesis imperfecta in her sister. There is no history of Cancer.   LABORATORY DATA:  Results for orders placed in visit on 08/17/13 (from the past 48 hour(s))  CBC WITH DIFFERENTIAL     Status: Abnormal   Collection Time    08/17/13 10:49 AM      Result Value Range   WBC 7.6  3.9 - 10.3 10e3/uL   NEUT# 3.6  1.5 - 6.5 10e3/uL   HGB 11.8  11.6 - 15.9 g/dL   HCT 34.6 (*) 34.8 - 46.6 %   Platelets 233  145 - 400 10e3/uL   MCV 98.1  79.5 - 101.0 fL   MCH 33.6  25.1 - 34.0 pg   MCHC 34.2  31.5 - 36.0 g/dL   RBC 3.52 (*) 3.70 - 5.45 10e6/uL   RDW 12.6  11.2 - 14.5 %   lymph# 2.8  0.9 - 3.3 10e3/uL   MONO# 0.9  0.1 - 0.9 10e3/uL   Eosinophils Absolute 0.2  0.0 - 0.5 10e3/uL   Basophils Absolute 0.1  0.0 - 0.1 10e3/uL   NEUT% 47.7  38.4 - 76.8 %   LYMPH% 37.1  14.0 - 49.7 %   MONO% 12.0  0.0 - 14.0 %   EOS% 2.5  0.0 - 7.0 %   BASO% 0.7  0.0 - 2.0 %  COMPREHENSIVE METABOLIC PANEL (0000000)     Status: None   Collection Time    08/17/13 10:49 AM      Result Value Range   Sodium 142  136 - 145 mEq/L   Potassium 3.8  3.5 - 5.1 mEq/L   Chloride 103  98 - 109 mEq/L   CO2 29  22 - 29 mEq/L   Glucose 99  70 - 140 mg/dl   BUN 20.8  7.0 - 26.0 mg/dL   Creatinine 0.8  0.6 - 1.1 mg/dL   Total Bilirubin 0.46  0.20 - 1.20 mg/dL   Alkaline Phosphatase 67  40 - 150 U/L   AST 20  5 - 34 U/L   ALT 23  0 - 55 U/L   Total Protein 7.4  6.4 - 8.3 g/dL   Albumin 4.0  3.5 - 5.0 g/dL   Calcium 9.5  8.4 - 10.4 mg/dL   Anion Gap 9  3 - 11 mEq/L     Results for Dorothy Rojas, Dorothy Rojas (MRN [id number]) as of 08/18/2013 15:06  Ref. Range 07/28/2013 17:26  Anticardiolipin  IgA Latest Range: <22 APL U/mL 12  Anticardiolipin IgG Latest Range: <23 GPL U/mL 8  Anticardiolipin IgM Latest Range: <11 MPL U/mL 3  PTT Lupus Anticoagulant Latest Range: 28.0-43.0 secs 28.6  PTTLA Confirmation Latest Range: <8.0 secs NOT APPL  PTTLA  4:1 Mix Latest Range: 28.0-43.0 secs NOT APPL  DRVVT Latest Range: <42.9 secs 22.1  Drvvt confirmation Latest Range: <1.11 Ratio NOT APPL  Lupus Anticoagulant Latest Range: NOT DETECTED  NOT DETECTED  Beta-2 Glyco I IgG Latest Range: <20 G Units 4  Beta-2-Glycoprotein I IgA Latest Range: <20 A Units 13  Beta-2-Glycoprotein I IgM Latest Range: <20 M Units 4  AntiThromb III Func Latest Range: 76-126 % 110  Recommendations-F5LEID: No range found Pend  Recommendations-PTGENE: No range found Pend  DRVVT 1:1 Mix Latest Range: <42.9 secs NOT APPL  Protein C Activity Latest Range: 75-133 % 136 (H)  Protein C, Total Latest Range: 72-160 % 86  Protein S Activity Latest Range: 69-129 % 93    Results for Dorothy Rojas, Dorothy Rojas (MRN [id number]) as of 08/18/2013 15:06  Ref. Range 08/14/2013 01:17  Color, Urine Latest Range: YELLOW  RED (A)  APPearance Latest Range: CLEAR  TURBID (A)  Specific Gravity, Urine Latest Range: 1.005-1.030  1.028  pH Latest Range: 5.0-8.0  5.0  Glucose Latest Range: NEGATIVE mg/dL NEGATIVE  Bilirubin Urine Latest Range: NEGATIVE  LARGE (A)  Ketones, ur Latest Range: NEGATIVE mg/dL 40 (A)  Protein Latest Range: NEGATIVE mg/dL >300 (A)  Urobilinogen, UA Latest Range: 0.0-1.0 mg/dL 1.0  Nitrite Latest Range: NEGATIVE  POSITIVE (A)  Leukocytes, UA Latest Range: NEGATIVE  LARGE (A)  Hgb urine dipstick Latest Range: NEGATIVE  LARGE (A)  WBC, UA Latest Range: <3 WBC/hpf 0-2  RBC / HPF Latest Range: <3 RBC/hpf TOO NUMEROUS TO COUNT  Squamous Epithelial / LPF Latest Range: RARE  RARE  Bacteria, UA Latest Range: RARE  FEW (A)  Organism ID, Bacteria No range found ESCHERICHIA COLI  URINE CULTURE No range found Rpt    RADIOGRAPHY: Lower extremity duplex 07/28/2013 Left Lower Extremity Venous Duplex Evaluation  Patient: Dorothy Rojas, Dorothy Rojas MR #: [id number] Study Date: 07/28/2013 Gender: F Age: 5 Height: Weight: BSA: Pt. Status: Room:  SONOGRAPHER Sharion Dove, RVS ATTENDING Cheryle Horsfall, Eve Terrial Rhodes, Eve Reports also to:  ------------------------------------------------------------ History and indications:  Indications  729.5 Pain in limb. 729.81 Swelling of limb. History  Diagnostic evaluation.  ------------------------------------------------------------ Study information:  Study status: Routine. Procedure: A vascular evaluation was performed with the patient in the supine position. The right common femoral, left common femoral, left femoral, left greater saphenous, left lesser saphenous, left profunda femoral, left popliteal, left peroneal, and left posterior tibial veins were studied. Image quality was adequate. Left lower extremity venous duplex evaluation. Doppler flow study including B-mode compression maneuvers of all visualized segments, color flow Doppler and selected views of pulsed wave Doppler. Location: Vascular laboratory. Patient status: Outpatient.  Venous flow:  +-----------------+------------+---------------+-----------+ Location Overall Flow propertiesThrombus  +-----------------+------------+---------------+-----------+ Left common Patent Phasic; ----------- femoral  spontaneous;     compressible   +-----------------+------------+---------------+-----------+ Left femoral Patent Compressible ----------- +-----------------+------------+---------------+-----------+ Left profunda Patent Compressible ----------- femoral     +-----------------+------------+---------------+-----------+ Left popliteal Patent Phasic; -----------   spontaneous;     compressible    +-----------------+------------+---------------+-----------+ Left posterior Patent Compressible ----------- tibial     +-----------------+------------+---------------+-----------+ Left peroneal Partially Partially Acute   thrombosed compressible thrombus  +-----------------+------------+---------------+-----------+ Left Patent Compressible ----------- saphenofemoral     junction     +-----------------+------------+---------------+-----------+ Left greater Patent Compressible ----------- saphenous     +-----------------+------------+---------------+-----------+ Left lesser Patent Compressible ----------- saphenous     +-----------------+------------+---------------+-----------+ Right common Patent Phasic; ----------- femoral  spontaneous;     compressible   +-----------------+------------+---------------+-----------+  ------------------------------------------------------------ Summary: Findings consistent  with partially occlusive acute deep vein thrombosis involving the peroneal vein of the left lower extremity.  REVIEW OF SYSTEMS:  Constitutional: Denies fevers, chills or abnormal weight loss Eyes: Denies blurriness of vision Ears, nose, mouth, throat, and face: Denies mucositis or sore throat Respiratory: Denies cough, dyspnea or wheezes Cardiovascular: Denies palpitation, chest discomfort or lower extremity swelling Gastrointestinal:  Denies nausea, heartburn or change in bowel habits Skin: Denies abnormal skin rashes Lymphatics: Denies new lymphadenopathy or easy bruising Neurological:Denies numbness, tingling or new weaknesses Behavioral/Psych: Mood is stable, no new changes  All other systems were reviewed with the patient and are negative.  PHYSICAL EXAM:  height is 5\' 4"  (1.626 m) and weight is 131 lb 8 oz (59.648 kg). Her oral temperature is 97.9 F (36.6 C). Her blood pressure is 120/55 and  her pulse is 66. Her respiration is 18.    GENERAL:alert, no distress and comfortable, well developed and well nourished.  SKIN: skin color, texture, turgor are normal, no rashes or significant lesions EYES: normal, Conjunctiva are pink and non-injected, sclera clear OROPHARYNX:no exudate, no erythema and lips, buccal mucosa, and tongue normal  NECK: supple, thyroid normal size, non-tender, without nodularity LYMPH:  no palpable lymphadenopathy in the cervical, axillary or inguinal LUNGS: clear to auscultation and percussion with normal breathing effort HEART: regular rate & rhythm and no murmurs and no lower extremity edema ABDOMEN:abdomen soft, non-tender and normal bowel sounds Musculoskeletal:no cyanosis of digits and no clubbing  NEURO: alert & oriented x 3 with fluent speech, no focal motor/sensory deficits   IMPRESSION: Dorothy Rojas is a 68 y.o. female with a history of distal Left Leg DVT found to be heterozygous for Factor V mutation.    PLAN:  1.  Distal Left leg DVT of peroneal vein, non-provoked.  -- Based on the location being a distal vein, it might be reasonable to treat for at least 3 months.  If bleeding risk minimal, due to a positive heterozygous for Factor V Mutation, it would also be reasonable to treat for 6 months.  We will continue to weigh her preferences, bleeding risk but will treat for at minimal 3 months with xarelto 20 mg.  She was provided a handout on xarelto and indications for treatment of her deep venous thrombosis.  Her creatinine is normal.  Her liver function tests are normal.  She denies a history of falls or gastrointestinal bleeding.   2. Heterozygous for Factor V Mutation.  --  We discussed in depth that her heterozygous for Factor V mutation is a lower risk thrombophlia with increase of venous thromboembolism (VTE) of approximately sevenfold increased risk of thrombosis, while homozygosity for the mutation increases thrombosis risk 80-fold.  Most  clinicians do NOT indefinitely anticoagulate patients who have a first episode of provoked VTE and "lower risk" thrombophilias (eg, factor V Leyden deficiency).   --We then discussed that testing for factor V leiden is recommended for all individuals with venous thrombosis or history of a thrombic event.  We believe that the benefits of screening are likely to be significant in the asymptomatic family member with thrombophilia only if there is a strong family history of VTE (ie, multiple first-degree relatives with thrombotic events prior to age 40).  Her first clot is at the age 2 and so her children should not be tested if they are asymptomatic.   3. UTI, E. Coli (51/88) complicated by hematuria. -- She reports improvement on cipro (sensitive by culture on 08/14/2013).   4.  Follow-up.  --Patient was instructed to report any bleeding for repeat labs and go to the emergency room should she experience any gastrointestinal bleeding.   She should follow up for repeat labs in 2 months.    All questions were answered. The patient knows to call the clinic with any problems, questions or concerns. We can certainly see the patient much sooner if necessary.  I spent 25 minutes counseling the patient face to face. The total time spent in the appointment was 45 minutes.    Faysal Fenoglio, MD 08/18/2013 11:00 AM

## 2013-08-19 ENCOUNTER — Encounter: Payer: Self-pay | Admitting: Internal Medicine

## 2013-08-24 DIAGNOSIS — M25569 Pain in unspecified knee: Secondary | ICD-10-CM | POA: Diagnosis not present

## 2013-08-26 DIAGNOSIS — M25569 Pain in unspecified knee: Secondary | ICD-10-CM | POA: Diagnosis not present

## 2013-08-30 DIAGNOSIS — M25569 Pain in unspecified knee: Secondary | ICD-10-CM | POA: Diagnosis not present

## 2013-09-01 DIAGNOSIS — M25569 Pain in unspecified knee: Secondary | ICD-10-CM | POA: Diagnosis not present

## 2013-09-06 DIAGNOSIS — M25569 Pain in unspecified knee: Secondary | ICD-10-CM | POA: Diagnosis not present

## 2013-09-07 ENCOUNTER — Ambulatory Visit (INDEPENDENT_AMBULATORY_CARE_PROVIDER_SITE_OTHER): Payer: Medicare Other | Admitting: Family Medicine

## 2013-09-07 ENCOUNTER — Encounter: Payer: Self-pay | Admitting: Family Medicine

## 2013-09-07 VITALS — BP 130/68 | HR 78 | Ht 64.0 in | Wt 130.0 lb

## 2013-09-07 DIAGNOSIS — I82409 Acute embolism and thrombosis of unspecified deep veins of unspecified lower extremity: Secondary | ICD-10-CM | POA: Diagnosis not present

## 2013-09-07 DIAGNOSIS — M79662 Pain in left lower leg: Secondary | ICD-10-CM

## 2013-09-07 DIAGNOSIS — D6851 Activated protein C resistance: Secondary | ICD-10-CM

## 2013-09-07 DIAGNOSIS — K6289 Other specified diseases of anus and rectum: Secondary | ICD-10-CM | POA: Diagnosis not present

## 2013-09-07 DIAGNOSIS — D6859 Other primary thrombophilia: Secondary | ICD-10-CM | POA: Diagnosis not present

## 2013-09-07 DIAGNOSIS — M79609 Pain in unspecified limb: Secondary | ICD-10-CM | POA: Diagnosis not present

## 2013-09-07 NOTE — Patient Instructions (Addendum)
  Left upper medial calf nodule--differential diagnosis includes hematoma (given bruising recently due to compression stockings) vs recurrent DVT (although on blood thinners).  Recommend warm compresses, and continued close observation.  Expect gradual improvement in size of nodule, (and decreasing tenderness).  If this doesn't decrease in size, or if it gets larger and/or more painful, needs to have ultrasound repeated to ensure no progression of DVT.  Use stool softeners (ie Colace) to keep stools soft. I suspect a healing anal fissure may be contributing to some discomfort, which is worsened by passing hard stools.

## 2013-09-07 NOTE — Progress Notes (Signed)
Chief Complaint  Patient presents with  . Rectal Pain    pt states she had sharp pain in her anal area yesterday and then  it went away and has experienced short sharp pains today, pt states she woul like you to look at a lump in her left leg   Yesterday while sitting and knitting, she had a very sharp, excruciating pain in her rectum.  Felt like a screwdriver was in her bottom.  It lasted for 2-3 seconds, and had no further symptoms later that day.  She had a bowel movement earlier that day, and it was soft (she has intermittent hard stools).  Today she has had some twinges of sharp pain off and on.  Pain isn't as severe as yesterday--very sharp, but so short-lived that it isn't as bad as yesterday.  She hasn't had a BM yet today (normal for her).  No blood in the stool.  Denies any other pain elsewhere.  Denies any bleeding.  Denies vaginal discomfort, discharge.  No dysuria, no recurrence of hematuria.  She has felt a knot in her left leg, for about 2 weeks.  Has been in that area every since being diagnosed with DVT, but doesn't think the knot has been there the whole time.  She had significant bruising over the upper part of the left leg when wearing the compression stockings over this area.  She had to stop wearing the stocking so high (due to the pain and bruising).    Getting PT for her knees. She just stopped the oxytrol patch (OTC) earlier this week, to see if she still needs it.  She is still taking the myrbetriq.  Bladder has been doing well.   Past Medical History  Diagnosis Date  . Hypertension   . Elevated cholesterol   . Osteopenia     last DEXA 10/2009  . Former smoker     Quit in 1984  . Mixed stress and urge urinary incontinence     Dr. McDiarmid  . Torn meniscus     L knee  . Heterozygous factor V Leiden mutation 07/2013  . DVT (deep venous thrombosis) 07/2013    L peroneal v   Past Surgical History  Procedure Laterality Date  . Tonsillectomy    . Total abdominal  hysterectomy w/ bilateral salpingoophorectomy  04/1999  . Appendectomy  1973  . Ovarian cyst removal  1973    LEFT OVARIAN  . Incontinence surgery  04/1999    Re-do at [facility] for sling complications in [id number]  . Neuroma surgery  1980's    bilateral feet  . Lateral epicondyle release      Right  . Cystocele repair  11/2010    Dr. McDiarmid  . Knee arthroscopy Right 04/05/13    Dr. Onnie Graham   History   Social History  . Marital Status: Married    Spouse Name: N/A    Number of Children: 3  . Years of Education: N/A   Occupational History  .     Social History Main Topics  . Smoking status: Former Smoker    Quit date: 08/11/1985  . Smokeless tobacco: Never Used  . Alcohol Use: Yes     Comment: 1 glass of wine on weekends only (2-3/week)  . Drug Use: No  . Sexual Activity: Not Currently   Other Topics Concern  . Not on file   Social History Narrative   Married.  Lives with husband.  Children live in [place], [place].  Youngest  child was killed in [place].  3 stepchildren are all local. 1 dog    Outpatient Encounter Prescriptions as of 09/07/2013  Medication Sig  . aspirin 81 MG tablet Take 81 mg by mouth daily.  [name] lisinopril-hydrochlorothiazide (PRINZIDE,ZESTORETIC) 20-12.5 MG per tablet Take 1 tablet by mouth daily.  . mirabegron ER (MYRBETRIQ) 50 MG TB24 Take 50 mg by mouth daily.  . Rivaroxaban (XARELTO) 20 MG TABS tablet Take 1 tablet (20 mg total) by mouth daily with supper.  . simvastatin (ZOCOR) 40 MG tablet Take 1 tablet (40 mg total) by mouth at bedtime.  [name] oxybutynin (OXYTROL) 3.9 MG/24HR Place 1 patch onto the skin See admin instructions. Change after 4 days  . [DISCONTINUED] cephALEXin (KEFLEX) 500 MG capsule Take 1 capsule (500 mg total) by mouth 3 (three) times daily.   Allergies  Allergen Reactions  . Naprosyn [Naproxen] Rash   ROS:  Denies fevers, chills, URI symptoms, headaches, dizziness, bleeding, urinary complaints. +intermittent constipation, no blood in stool.  Denies  nausea, vomiting, chest pain, palpitations or other complaints, except as noted in HPI  PHYSICAL EXAM: BP 130/68  Pulse 78  Ht 5\' 4"  (1.626 m)  Wt 130 lb (58.968 kg)  BMI 22.30 kg/m2 Very pleasant female, in no distress  Rectal exam:  Very small pink area anteriorly, consistent with a recently healed fissure.  She is mildly tender over this area.  On rectal exam, she had normal sphincter tone; hard stool was in the rectal vault (heme negative).  There was no tenderness, induration or masses appreciable.  1-1.5 cm nodule palpable, and tender in the upper portion of the calf, medially by the knee.  This is same area that was painful when diagnosed with the DVT, but thinks this nodule is new.   ASSESSMENT/PLAN:  Rectal pain - suspect muscle spasm given hx.  exam consistent with healing fissure, constipation.  start Colace.  increase fluids, ensure dietary K+ intake  Pain of left calf - differential dx reviewed--recurrent DVT (although on blood thinner) vs hematoma (more likely, with recent bruising in that area).  warm compresses and monitor  Heterozygous factor V Leiden mutation - reviewed hematologist's recent notes and recommendations.  continue anticoagulation for treatment of DVT  Acute DVT (deep venous thrombosis) - on anticoagulation. consider repeat u/s sooner than 3 mo f/u if increasing pain/nodule/swelling.    Left upper medial calf nodule--differential diagnosis includes hematoma (given bruising recently due to compression stockings) vs recurrent DVT (although on blood thinners).  Recommend warm compresses, and continued close observation.  Expect gradual improvement in size of nodule, (and decreasing tenderness).  If this doesn't decrease in size, or if it gets larger and/or more painful, needs to have ultrasound repeated to ensure no progression of DVT.  25 min visit, more than 1/2 spent counseling, answering questions

## 2013-09-08 DIAGNOSIS — M25569 Pain in unspecified knee: Secondary | ICD-10-CM | POA: Diagnosis not present

## 2013-09-13 DIAGNOSIS — M25569 Pain in unspecified knee: Secondary | ICD-10-CM | POA: Diagnosis not present

## 2013-09-14 ENCOUNTER — Other Ambulatory Visit (HOSPITAL_BASED_OUTPATIENT_CLINIC_OR_DEPARTMENT_OTHER): Payer: Medicare Other

## 2013-09-14 DIAGNOSIS — I82402 Acute embolism and thrombosis of unspecified deep veins of left lower extremity: Secondary | ICD-10-CM

## 2013-09-14 DIAGNOSIS — I82409 Acute embolism and thrombosis of unspecified deep veins of unspecified lower extremity: Secondary | ICD-10-CM | POA: Diagnosis not present

## 2013-09-14 DIAGNOSIS — D6859 Other primary thrombophilia: Secondary | ICD-10-CM | POA: Diagnosis not present

## 2013-09-14 DIAGNOSIS — D6851 Activated protein C resistance: Secondary | ICD-10-CM

## 2013-09-14 LAB — CBC WITH DIFFERENTIAL/PLATELET
BASO%: 0.8 % (ref 0.0–2.0)
Basophils Absolute: 0 10*3/uL (ref 0.0–0.1)
EOS ABS: 0.2 10*3/uL (ref 0.0–0.5)
EOS%: 4.2 % (ref 0.0–7.0)
HCT: 35.2 % (ref 34.8–46.6)
HGB: 12 g/dL (ref 11.6–15.9)
LYMPH#: 2 10*3/uL (ref 0.9–3.3)
LYMPH%: 46.3 % (ref 14.0–49.7)
MCH: 33.2 pg (ref 25.1–34.0)
MCHC: 34 g/dL (ref 31.5–36.0)
MCV: 97.6 fL (ref 79.5–101.0)
MONO#: 0.5 10*3/uL (ref 0.1–0.9)
MONO%: 11.7 % (ref 0.0–14.0)
NEUT%: 37 % — ABNORMAL LOW (ref 38.4–76.8)
NEUTROS ABS: 1.6 10*3/uL (ref 1.5–6.5)
PLATELETS: 207 10*3/uL (ref 145–400)
RBC: 3.61 10*6/uL — AB (ref 3.70–5.45)
RDW: 13.2 % (ref 11.2–14.5)
WBC: 4.4 10*3/uL (ref 3.9–10.3)

## 2013-09-15 DIAGNOSIS — M25569 Pain in unspecified knee: Secondary | ICD-10-CM | POA: Diagnosis not present

## 2013-09-20 DIAGNOSIS — M25569 Pain in unspecified knee: Secondary | ICD-10-CM | POA: Diagnosis not present

## 2013-10-12 ENCOUNTER — Ambulatory Visit (INDEPENDENT_AMBULATORY_CARE_PROVIDER_SITE_OTHER): Payer: Medicare Other | Admitting: Family Medicine

## 2013-10-12 ENCOUNTER — Encounter: Payer: Self-pay | Admitting: Family Medicine

## 2013-10-12 VITALS — BP 118/72 | HR 76 | Ht 64.0 in | Wt 130.0 lb

## 2013-10-12 DIAGNOSIS — I6529 Occlusion and stenosis of unspecified carotid artery: Secondary | ICD-10-CM

## 2013-10-12 DIAGNOSIS — M8580 Other specified disorders of bone density and structure, unspecified site: Secondary | ICD-10-CM

## 2013-10-12 DIAGNOSIS — Z0000 Encounter for general adult medical examination without abnormal findings: Secondary | ICD-10-CM

## 2013-10-12 DIAGNOSIS — Z01419 Encounter for gynecological examination (general) (routine) without abnormal findings: Secondary | ICD-10-CM

## 2013-10-12 DIAGNOSIS — E7800 Pure hypercholesterolemia, unspecified: Secondary | ICD-10-CM

## 2013-10-12 DIAGNOSIS — I10 Essential (primary) hypertension: Secondary | ICD-10-CM

## 2013-10-12 DIAGNOSIS — M899 Disorder of bone, unspecified: Secondary | ICD-10-CM

## 2013-10-12 DIAGNOSIS — R195 Other fecal abnormalities: Secondary | ICD-10-CM

## 2013-10-12 DIAGNOSIS — R3129 Other microscopic hematuria: Secondary | ICD-10-CM

## 2013-10-12 DIAGNOSIS — M25569 Pain in unspecified knee: Secondary | ICD-10-CM | POA: Diagnosis not present

## 2013-10-12 DIAGNOSIS — M1710 Unilateral primary osteoarthritis, unspecified knee: Secondary | ICD-10-CM | POA: Diagnosis not present

## 2013-10-12 DIAGNOSIS — M949 Disorder of cartilage, unspecified: Secondary | ICD-10-CM | POA: Diagnosis not present

## 2013-10-12 DIAGNOSIS — D6851 Activated protein C resistance: Secondary | ICD-10-CM

## 2013-10-12 DIAGNOSIS — I82409 Acute embolism and thrombosis of unspecified deep veins of unspecified lower extremity: Secondary | ICD-10-CM

## 2013-10-12 DIAGNOSIS — D6859 Other primary thrombophilia: Secondary | ICD-10-CM

## 2013-10-12 LAB — HEMOCCULT GUIAC POC 1CARD (OFFICE): Fecal Occult Blood, POC: POSITIVE

## 2013-10-12 LAB — POCT URINALYSIS DIPSTICK
BILIRUBIN UA: NEGATIVE
GLUCOSE UA: NEGATIVE
KETONES UA: NEGATIVE
Nitrite, UA: NEGATIVE
Protein, UA: NEGATIVE
Spec Grav, UA: 1.015
Urobilinogen, UA: NEGATIVE
pH, UA: 6

## 2013-10-12 NOTE — Progress Notes (Signed)
Chief Complaint  Patient presents with  . Annual Exam    Med check plus and Annual Wellness Visit. No urinary symptoms-UA showed trace leuks and 2+ blood.    Dorothy Rojas is a 68 y.o. female who presents for a med check plus annual wellness Medicare visit.   She was recently seen with acute rectal pain.  She had only had one minor recurrence of the rectal pain (short-lived) since that visit, resolved.  Left DVT--The discomfort in her left leg is improved--still has small nodule, but not tender.  Wears compression hose when sitting/standing for a long time.  She is taking anticoagulants without any side effects.  She denies bleeding, has some mild bruising.  No further hematuria.  She has f/u with hematologist tomorrow.  She found, interestingly enough, that her knees hurt less when she is wearing her compression stockings.  Hypertension follow-up: Blood pressures elsewhere are 110-120/70's, occasionally up to 140/80.  Denies feeling lightheaded or presyncopal, headaches, chest pain. Denies side effects of medications.   Hyperlipidemia follow-up: Patient is reportedly following a low-fat, low cholesterol diet. Compliant with medications and denies medication side effects.  Urinary incontinence: Followed by Dr. McDiarmid. She had been on Mirabegron in addition to accupuncture (q3 weeks) and oxytrol patch, and this combination has been effective.  She had to stop the accupuncture, as Medicare only covered it for 2 years.  She was recently changed to oxybutynin instead of Mirabegron and patch, due to formulary/cost issues.  So far, symptoms seemed to be adequately controlled on this regimen.  Dorothy Rojas was last seen in November 2012 for vertigo. She had physical therapy, symptoms resolved. She has some recurrent symptoms that are very mild, just rarely (usually after flying, but didn't have it with recent trip).  Multinodular thyroid--s/p FNA in August 2013 of dominant L nodule, which was  benign. Denies neck pain or dysphagia. Denies any thyroid symptoms.  Carotid artery disease--had f/u u/s in 04/2013 which was stable (<50% blockage bilaterally in internal carotids).  AWV: See scanned questionnaires--negative for depression and ADL issues.   Other doctors involved in care of patient:  Dr. McDiarmid (urology)  Dr. Gershon Crane (ophtho)  Dr. Lajoyce Corners (GI--hasn't seen since colonoscopy)  Dr. Conley Canal (dentist)  Dr. Juliann Mule (hematologist)  End of life issues: She has a living will, and a healthcare power of attorney.    Immunization History  Administered Date(s) Administered  . Influenza Whole 04/09/2011  . Influenza,inj,Quad PF,36+ Mos 04/14/2013  . Pneumococcal Polysaccharide-23 04/09/2011  . Td 08/07/2006  . Zoster 10/25/2008   Last Pap smear: can't recall. S/p hysterectomy for benign reasons  Last mammogram: 11/2012 Last colonoscopy: 2007 with Dr. Lajoyce Corners  Last DEXA: 11/2012 at [facility] showing osteopenia, worse in spine compared to 2011. Ophtho: yearly  Dentist: regularly (3x/year)  Exercise: Wii Fit plus, step aerobics (30 min), yoga, and walks (most days when weather is warmer). Did less for a little bit due to knee pain, but has restarted.  No upper body weight-bearing activity.  Past Medical History  Diagnosis Date  . Hypertension   . Elevated cholesterol   . Osteopenia     last DEXA 10/2009  . Former smoker     Quit in 1984  . Mixed stress and urge urinary incontinence     Dr. McDiarmid  . Torn meniscus     L knee  . Heterozygous factor V Leiden mutation 07/2013  . DVT (deep venous thrombosis) 07/2013    L peroneal v  Past Surgical History  Procedure Laterality Date  . Tonsillectomy    . Total abdominal hysterectomy w/ bilateral salpingoophorectomy  04/1999  . Appendectomy  1973  . Ovarian cyst removal  1973    LEFT OVARIAN  . Incontinence surgery  04/1999    Re-do at [facility] for sling complications in 0177  . Neuroma surgery  1980's    bilateral feet   . Lateral epicondyle release      Right  . Cystocele repair  11/2010    Dr. McDiarmid  . Knee arthroscopy Right 04/05/13    Dr. Onnie Graham    History   Social History  . Marital Status: Married    Spouse Name: N/A    Number of Children: 3  . Years of Education: N/A   Occupational History  .     Social History Main Topics  . Smoking status: Former Smoker    Quit date: 08/11/1985  . Smokeless tobacco: Never Used  . Alcohol Use: Yes     Comment: 1 glass of wine on weekends only (2-3/week)  . Drug Use: No  . Sexual Activity: Not Currently   Other Topics Concern  . Not on file   Social History Narrative   Married.  Lives with husband.  Children live in [place], [place].  Youngest child was killed in [place].  3 stepchildren are all local. 1 dog    Family History  Problem Relation Age of Onset  . Hypertension Father   . Heart disease Father 27    CABG, died from MI at 68  . COPD Father   . Cirrhosis Mother     related to psoriasis medications  . Diabetes Mother   . Osteogenesis imperfecta Sister   . Heart disease Sister     CHF  . Diabetes Brother   . Heart disease Brother 22    MI  . Deep vein thrombosis Brother     factor V Leiden NEGATIVE  . Cancer Neg Hx    Outpatient Encounter Prescriptions as of 10/12/2013  Medication Sig  . aspirin 81 MG tablet Take 81 mg by mouth daily.  . Calcium Carbonate-Vitamin D (CALCIUM 600+D) 600-400 MG-UNIT per tablet Take 1 tablet by mouth daily.  [name] lisinopril-hydrochlorothiazide (PRINZIDE,ZESTORETIC) 20-12.5 MG per tablet Take 1 tablet by mouth daily.  [name] oxybutynin (DITROPAN-XL) 10 MG 24 hr tablet Take 10 mg by mouth at bedtime.  . Rivaroxaban (XARELTO) 20 MG TABS tablet Take 1 tablet (20 mg total) by mouth daily with supper.  . simvastatin (ZOCOR) 40 MG tablet Take 1 tablet (40 mg total) by mouth at bedtime.  . [DISCONTINUED] mirabegron ER (MYRBETRIQ) 50 MG TB24 Take 50 mg by mouth daily.  . [DISCONTINUED] oxybutynin (OXYTROL) 3.9 MG/24HR Place  1 patch onto the skin See admin instructions. Change after 4 days    Allergies  Allergen Reactions  . Naprosyn [Naproxen] Rash   ROS:The patient denies anorexia, fever, weight changes, headaches, vision changes, decreased hearing, ear pain, sore throat, breast concerns, chest pain, palpitations, syncope, dyspnea on exertion, cough, swelling, nausea, vomiting, diarrhea, constipation, abdominal pain, melena, hematochezia, hematuria, dysuria, vaginal bleeding, discharge, odor or itch, genital lesions, numbness, tingling, weakness, tremor, suspicious skin lesions, depression, anxiety, abnormal bleeding or enlarged lymph nodes.  Occasional L knee pain with certain activities (walking hills). Has also been having some right knee pain--intermittent. Slight PND Stools softer since using stool softener. No further rectal pain. +bruising (mild)  PHYSICAL EXAM: BP 118/72  Pulse 76  Ht 5'  4" (1.626 m)  Wt 130 lb (58.968 kg)  BMI 22.30 kg/m2  General Appearance:  Alert, cooperative, no distress, appears stated age   Head:  Normocephalic, without obvious abnormality, atraumatic   Eyes:  PERRL, conjunctiva/corneas clear, EOM's intact, fundi  benign   Ears:  Normal TM's and external ear canals   Nose:  Nares normal, no drainage or sinus tenderness; Nasal mucosa mild-mod edematous, pale, some blood noted in R nare   Throat:  Lips, mucosa, and tongue normal; teeth and gums normal   Neck:  Supple, no lymphadenopathy; thyroid: no enlargement/tenderness/nodules; no carotid  bruit or JVD   Back:  Spine nontender, no curvature, ROM normal, no CVA tenderness   Lungs:  Clear to auscultation bilaterally without wheezes, rales or ronchi; respirations unlabored   Chest Wall:  No tenderness or deformity   Heart:  Regular rate and rhythm, S1 and S2 normal, no murmur, rub  or gallop   Breast Exam:  No tenderness, masses, or nipple discharge. Nipples are inverted bilaterally (chronic, per pt). No axillary  lymphadenopathy   Abdomen:  Soft, non-tender, nondistended, normoactive bowel sounds,  no masses, no hepatosplenomegaly   Genitalia:  Normal external genitalia without lesions. BUS and vagina normal; Uterus and adnexa surgically absent. No masses palpable   Rectal:  Normal tone, no masses or tenderness; guaiac--trace heme+ stooll   Extremities:  No clubbing, cyanosis or edema.  Very mildly tender nodule LLE (inferomedial to knee).  No cords, no edema.  Pulses:  2+ and symmetric all extremities   Skin:  Skin color, texture, turgor normal, no rashes or suspicious lesions. Many SK's.   Lymph nodes:  Cervical, supraclavicular, and axillary nodes normal   Neurologic:  CNII-XII intact, normal strength, sensation and gait; reflexes 2+ and symmetric throughout         Psych: Normal mood, affect, hygiene and grooming.   ASSESSMENT/PLAN: Medicare annual wellness visit, subsequent - Plan: Glucose, random  Essential hypertension, benign - controlled  Pure hypercholesterolemia - labs due--unable to stay to get drawn today (had another appt)--return fasting next week - Plan: Lipid panel  Carotid stenosis - stable  Osteopenia - some decline. discussed calcium, vitamin D, need for weight-bearing exercise.  recheck 2016  Acute DVT (deep venous thrombosis) - doing well on xarelto.  f/u with hematology as scheduled  Heterozygous factor V Leiden mutation  Routine general medical examination at a health care facility - Plan: Visual acuity screening, POCT Urinalysis Dipstick, Glucose, random  Routine gynecological examination  Microscopic hematuria - asymptomatic, likely related to anticoagulation. Given episode of gross hematuria with UTI recently, will check urine culture (not sent today--recollect next wk - Plan: Urine culture  Heme positive stool - likely related to anticoagulation.  recent fissure noted.  CBC's recently normal, and due to be checked again tomorrow by hematologist  Heme+stool;  Has  appt with hem tomorrow.  Last CBC 1 month ago Hg 12; planned to be rechecked tomorrow.  Needs to leave--unable to get blood drawn today.  Will return next week for lipids, fasting glucose (CBC and nonfasting c-met to be checked tomorrow by hematologist).  Med refills--to be done after labs back (will not run out before then).  Discussed monthly self breast exams and yearly mammograms after the age of 63; at least 30 minutes of aerobic activity at least 5 days/week; proper sunscreen use reviewed; healthy diet, including goals of calcium and vitamin D intake and alcohol recommendations (less than or equal to 1 drink/day) reviewed; regular  seatbelt use; changing batteries in smoke detectors.  Immunization recommendations discussed--see below.  Colonoscopy recommendations reviewed, UTD, due again 2017.  Repeat DEXA next year.  Check with your insurance to see if they would pay for Prevnar-13.  This is another pneumonia vaccine that is recommended in addition to the pneumovax that you had in 2012  AWV:  End of life issues reviewed.  MOST form reviewed and updated.  She has healthcare POA and living will.

## 2013-10-12 NOTE — Patient Instructions (Signed)
  HEALTH MAINTENANCE RECOMMENDATIONS:  It is recommended that you get at least 30 minutes of aerobic exercise at least 5 days/week (for weight loss, you may need as much as 60-90 minutes). This can be any activity that gets your heart rate up. This can be divided in 10-15 minute intervals if needed, but try and build up your endurance at least once a week.  Weight bearing exercise is also recommended twice weekly.  Eat a healthy diet with lots of vegetables, fruits and fiber.  "Colorful" foods have a lot of vitamins (ie green vegetables, tomatoes, red peppers, etc).  Limit sweet tea, regular sodas and alcoholic beverages, all of which has a lot of calories and sugar.  Up to 1 alcoholic drink daily may be beneficial for women (unless trying to lose weight, watch sugars).  Drink a lot of water.  Calcium recommendations are 1200-1500 mg daily (1500 mg for postmenopausal women or women without ovaries), and vitamin D 1000 IU daily.  This should be obtained from diet and/or supplements (vitamins), and calcium should not be taken all at once, but in divided doses.  Monthly self breast exams and yearly mammograms for women over the age of 65 is recommended.  Sunscreen of at least SPF 30 should be used on all sun-exposed parts of the skin when outside between the hours of 10 am and 4 pm (not just when at beach or pool, but even with exercise, golf, tennis, and yard work!)  Use a sunscreen that says "broad spectrum" so it covers both UVA and UVB rays, and make sure to reapply every 1-2 hours.  Remember to change the batteries in your smoke detectors when changing your clock times in the spring and fall.  Use your seat belt every time you are in a car, and please drive safely and not be distracted with cell phones and texting while driving.   Check with your insurance to see if they would pay for Prevnar-13.  This is another pneumonia vaccine that is recommended in addition to the pneumovax that you had in  2012

## 2013-10-13 ENCOUNTER — Other Ambulatory Visit (HOSPITAL_BASED_OUTPATIENT_CLINIC_OR_DEPARTMENT_OTHER): Payer: Medicare Other

## 2013-10-13 ENCOUNTER — Encounter: Payer: Self-pay | Admitting: Family Medicine

## 2013-10-13 ENCOUNTER — Ambulatory Visit (HOSPITAL_BASED_OUTPATIENT_CLINIC_OR_DEPARTMENT_OTHER): Payer: Medicare Other | Admitting: Internal Medicine

## 2013-10-13 ENCOUNTER — Telehealth: Payer: Self-pay | Admitting: Internal Medicine

## 2013-10-13 VITALS — BP 109/50 | HR 73 | Temp 98.1°F | Resp 19 | Ht 64.0 in | Wt 130.5 lb

## 2013-10-13 DIAGNOSIS — I82402 Acute embolism and thrombosis of unspecified deep veins of left lower extremity: Secondary | ICD-10-CM

## 2013-10-13 DIAGNOSIS — I82409 Acute embolism and thrombosis of unspecified deep veins of unspecified lower extremity: Secondary | ICD-10-CM

## 2013-10-13 DIAGNOSIS — D6851 Activated protein C resistance: Secondary | ICD-10-CM

## 2013-10-13 DIAGNOSIS — D6859 Other primary thrombophilia: Secondary | ICD-10-CM | POA: Diagnosis not present

## 2013-10-13 LAB — CBC WITH DIFFERENTIAL/PLATELET
BASO%: 0.6 % (ref 0.0–2.0)
Basophils Absolute: 0 10*3/uL (ref 0.0–0.1)
EOS ABS: 0.2 10*3/uL (ref 0.0–0.5)
EOS%: 3.1 % (ref 0.0–7.0)
HEMATOCRIT: 34.5 % — AB (ref 34.8–46.6)
HEMOGLOBIN: 11.8 g/dL (ref 11.6–15.9)
LYMPH%: 41.6 % (ref 14.0–49.7)
MCH: 33.1 pg (ref 25.1–34.0)
MCHC: 34.2 g/dL (ref 31.5–36.0)
MCV: 97 fL (ref 79.5–101.0)
MONO#: 0.8 10*3/uL (ref 0.1–0.9)
MONO%: 11.7 % (ref 0.0–14.0)
NEUT#: 2.8 10*3/uL (ref 1.5–6.5)
NEUT%: 43 % (ref 38.4–76.8)
Platelets: 221 10*3/uL (ref 145–400)
RBC: 3.56 10*6/uL — ABNORMAL LOW (ref 3.70–5.45)
RDW: 12.9 % (ref 11.2–14.5)
WBC: 6.5 10*3/uL (ref 3.9–10.3)
lymph#: 2.7 10*3/uL (ref 0.9–3.3)

## 2013-10-13 LAB — COMPREHENSIVE METABOLIC PANEL (CC13)
ALBUMIN: 4.1 g/dL (ref 3.5–5.0)
ALT: 23 U/L (ref 0–55)
AST: 21 U/L (ref 5–34)
Alkaline Phosphatase: 57 U/L (ref 40–150)
Anion Gap: 9 mEq/L (ref 3–11)
BUN: 19.7 mg/dL (ref 7.0–26.0)
CO2: 30 mEq/L — ABNORMAL HIGH (ref 22–29)
Calcium: 9.9 mg/dL (ref 8.4–10.4)
Chloride: 105 mEq/L (ref 98–109)
Creatinine: 0.8 mg/dL (ref 0.6–1.1)
GLUCOSE: 100 mg/dL (ref 70–140)
Potassium: 4 mEq/L (ref 3.5–5.1)
SODIUM: 144 meq/L (ref 136–145)
TOTAL PROTEIN: 7.4 g/dL (ref 6.4–8.3)
Total Bilirubin: 0.42 mg/dL (ref 0.20–1.20)

## 2013-10-13 NOTE — Telephone Encounter (Signed)
gv adn printed appt sched and avs for pt for April adn June....doppler on 6.15

## 2013-10-15 NOTE — Progress Notes (Signed)
[facility] OFFICE PROGRESS NOTE  KNAPP,EVE A, MD [place] [address]  DIAGNOSIS: Acute DVT (deep venous thrombosis) - Plan: CBC with Differential, CBC with Differential, Basic metabolic panel (Bmet) - [facility], Lower Extremity Venous Duplex Bilateral  Heterozygous factor V Leiden mutation - Plan: CBC with Differential, CBC with Differential, Basic metabolic panel (Bmet) - [facility], Lower Extremity Venous Duplex Bilateral  Chief Complaint  Patient presents with  . Acute DVT (deep venous thrombosis), left    CURRENT TREATMENT: Xalreto 20 mg daily  INTERVAL HISTORY: Dorothy Rojas 68 y.o. female who is referred by her PCP Dr. Tomi Bamberger for further evaluation and management for her Left leg DVT (07/28/2013) and heterozygous for factor V mutation. Patient is a reliable historian. Of, note she also has a significant history of hypertension, hypercholesteremia and recent urinary tract infection complicated by hematuria.  She reported to the emergency room on 07/28/2013 secondary to left leg pain and swelling. She reports 3 days prior to this presentation that she flew back from [place] on a one-hour flight. She noticed that she had some pain in the left knee intermittently with th pain being worse on Tuesday or 2 days prior to presentation. On that evening, she noticed an indentation from the top of her sock and a really firm swollen area above the level of sock medially involving the upper medial medial calf. She denies any redness at that time. In addition, she noticed that it felt firm and was very tender. So she went to bed and that evening. By the morning, one day prior to admission she had little improvement. She noted that it was hard and remain very tender. She took Tylenol, Motrin and Aleve without any relief of his pain. Due to persistence and pain and swelling of her left leg, she reported to the emergency room for further evaluation on 07/28/2013 duplex  ultrasound of the lower left extremity revealed deep venous thrombosis of left peroneal vein. She was started on Xalreto 15 mg bid. A hypercoagulable work-up was obtained revealing that she was had heterozygousity for factor V mutation and the remaining was negative. She was referred to our office for further evaluation and management.   Today, she continues to report that her left leg pain has nearly resolved. She started xalreto 20 mg daily on 08/15/2013. She reports that on 08/13/2013 she had a urinary tract infection complicated by frank hematuria that resolved with antibiotic treatment. She denies any bleeding. She denies prior clots. She has had three pregnancies without associated blood clots. She reports that her older brother has had 3 clots over the past several years. She attributes these clots to left leg injury. He has required an IVC filter because he was not a candidate for anti-coagulation. She is up-to-date for her screening exams including a colonoscopy in 2006, mammogram in 2014 which was negative.    MEDICAL HISTORY: Past Medical History  Diagnosis Date  . Hypertension   . Elevated cholesterol   . Osteopenia     last DEXA 10/2009  . Former smoker     Quit in 1984  . Mixed stress and urge urinary incontinence     Dr. McDiarmid  . Torn meniscus     L knee  . Heterozygous factor V Leiden mutation 07/2013  . DVT (deep venous thrombosis) 07/2013    L peroneal v    INTERIM HISTORY: has Essential hypertension, benign; Pure hypercholesterolemia; Multinodular goiter; Carotid stenosis; OAB (overactive bladder); Osteopenia; Acute DVT (  deep venous thrombosis); and Heterozygous factor V Leiden mutation on her problem list.    ALLERGIES:  is allergic to naprosyn.  MEDICATIONS: has a current medication list which includes the following prescription(s): aspirin, calcium carbonate-vitamin d, lisinopril-hydrochlorothiazide, oxybutynin, rivaroxaban, and simvastatin.  SURGICAL HISTORY:   Past Surgical History  Procedure Laterality Date  . Tonsillectomy    . Total abdominal hysterectomy w/ bilateral salpingoophorectomy  04/1999  . Appendectomy  1973  . Ovarian cyst removal  1973    LEFT OVARIAN  . Incontinence surgery  04/1999    Re-do at [facility] for sling complications in [id number]  . Neuroma surgery  1980's    bilateral feet  . Lateral epicondyle release      Right  . Cystocele repair  11/2010    Dr. McDiarmid  . Knee arthroscopy Right 04/05/13    Dr. Onnie Graham    REVIEW OF SYSTEMS:   Constitutional: Denies fevers, chills or abnormal weight loss Eyes: Denies blurriness of vision Ears, nose, mouth, throat, and face: Denies mucositis or sore throat Respiratory: Denies cough, dyspnea or wheezes Cardiovascular: Denies palpitation, chest discomfort or lower extremity swelling Gastrointestinal:  Denies nausea, heartburn or change in bowel habits Skin: Denies abnormal skin rashes Lymphatics: Denies new lymphadenopathy or easy bruising Neurological:Denies numbness, tingling or new weaknesses Behavioral/Psych: Mood is stable, no new changes  All other systems were reviewed with the patient and are negative.  PHYSICAL EXAMINATION: ECOG PERFORMANCE STATUS: 0 - Asymptomatic  Blood pressure 109/50, pulse 73, temperature 98.1 F (36.7 C), temperature source Oral, resp. rate 19, height 5\' 4"  (1.626 m), weight 130 lb 8 oz (59.194 kg).  GENERAL:alert, no distress and comfortable; well developed and well nourished.  SKIN: skin color, texture, turgor are normal, no rashes or significant lesions EYES: normal, Conjunctiva are pink and non-injected, sclera clear OROPHARYNX:no exudate, no erythema and lips, buccal mucosa, and tongue normal  NECK: supple, thyroid normal size, non-tender, without nodularity LYMPH:  no palpable lymphadenopathy in the cervical, axillary or supraclavicular LUNGS: clear to auscultation with normal breathing effort, no wheezes or rhonchi HEART: regular rate &  rhythm and no murmurs and no lower extremity edema ABDOMEN:abdomen soft, non-tender and normal bowel sounds Musculoskeletal:no cyanosis of digits and no clubbing  NEURO: alert & oriented x 3 with fluent speech, no focal motor/sensory deficits  Labs:  Lab Results  Component Value Date   WBC 6.5 10/13/2013   HGB 11.8 10/13/2013   HCT 34.5* 10/13/2013   MCV 97.0 10/13/2013   PLT 221 10/13/2013   NEUTROABS 2.8 10/13/2013      Chemistry      Component Value Date/Time   NA 144 10/13/2013 0910   NA 141 08/14/2013 0116   K 4.0 10/13/2013 0910   K 3.8 08/14/2013 0116   CL 101 08/14/2013 0116   CO2 30* 10/13/2013 0910   CO2 28 08/14/2013 0116   BUN 19.7 10/13/2013 0910   BUN 18 08/14/2013 0116   CREATININE 0.8 10/13/2013 0910   CREATININE 0.79 08/14/2013 0116   CREATININE 0.71 10/08/2012 0850      Component Value Date/Time   CALCIUM 9.9 10/13/2013 0910   CALCIUM 9.7 08/14/2013 0116   ALKPHOS 57 10/13/2013 0910   ALKPHOS 62 08/14/2013 0116   AST 21 10/13/2013 0910   AST 21 08/14/2013 0116   ALT 23 10/13/2013 0910   ALT 22 08/14/2013 0116   BILITOT 0.42 10/13/2013 0910   BILITOT 0.3 08/14/2013 0116       Basic Metabolic Panel:  Recent Labs Lab 10/13/13 0910  NA 144  K 4.0  CO2 30*  GLUCOSE 100  BUN 19.7  CREATININE 0.8  CALCIUM 9.9   GFR Estimated Creatinine Clearance: 58.9 ml/min (by C-G formula based on Cr of 0.8). Liver Function Tests:  Recent Labs Lab 10/13/13 0910  AST 21  ALT 23  ALKPHOS 57  BILITOT 0.42  PROT 7.4  ALBUMIN 4.1   CBC:  Recent Labs Lab 10/13/13 0910  WBC 6.5  NEUTROABS 2.8  HGB 11.8  HCT 34.5*  MCV 97.0  PLT 221   Studies:  No results found.   RADIOGRAPHIC STUDIES: No results found.  ASSESSMENT: Dorothy Rojas 68 y.o. female with a history of Acute DVT (deep venous thrombosis) - Plan: CBC with Differential, CBC with Differential, Basic metabolic panel (Bmet) - [facility], Lower Extremity Venous Duplex Bilateral  Heterozygous factor V Leiden mutation - Plan: CBC with  Differential, CBC with Differential, Basic metabolic panel (Bmet) - [facility], Lower Extremity Venous Duplex Bilateral   PLAN:  1. Distal Left leg DVT of peroneal vein, non-provoked (07/28/2013).  -- Based on the location being a distal vein, it might be reasonable to treat for at least 3 months. If bleeding risk minimal, due to a positive heterozygous for Factor V Mutation, it would also be reasonable to treat for 6 months. Her preference was for six months. Last visit, she was provided a handout on xarelto and indications for treatment of her deep venous thrombosis. Her creatinine is normal. Her liver function tests are normal. She denies a history of falls or gastrointestinal bleeding.  We will order a lower extremity to ensure resolution prior to stopping her anticoagulation.   2. Heterozygous for Factor V Mutation.  -- We discussed in depth that her heterozygous for Factor V mutation is a lower risk thrombophlia with increase of venous thromboembolism (VTE) of approximately sevenfold increased risk of thrombosis, while homozygosity for the mutation increases thrombosis risk 80-fold. Most clinicians do NOT indefinitely anticoagulate patients who have a first episode of provoked VTE and "lower risk" thrombophilias (eg, factor V Leyden deficiency).  --We then discussed that testing for factor V leiden is recommended for all individuals with venous thrombosis or history of a thrombic event. We believe that the benefits of screening are likely to be significant in the asymptomatic family member with thrombophilia only if there is a strong family history of VTE (ie, multiple first-degree relatives with thrombotic events prior to age 5). Her first clot is at the age 12 and so her children should not be tested if they are asymptomatic.   3. Follow-up.  --Patient was instructed to report any bleeding for repeat labs and go to the emergency room should she experience any gastrointestinal bleeding. She should  follow up for repeat labs in 1 month and for follow-up in the clinic in June prior to consideration of stopping her xalreto.  She will obtain lower extremity duplex prior. .   All questions were answered. The patient knows to call the clinic with any problems, questions or concerns. We can certainly see the patient much sooner if necessary.  I spent 15 minutes counseling the patient face to face. The total time spent in the appointment was 25 minutes.    Noelia Lenart, MD 10/15/2013 9:14 AM

## 2013-10-17 ENCOUNTER — Other Ambulatory Visit: Payer: Self-pay | Admitting: Family Medicine

## 2013-10-17 DIAGNOSIS — Z1231 Encounter for screening mammogram for malignant neoplasm of breast: Secondary | ICD-10-CM

## 2013-10-18 ENCOUNTER — Other Ambulatory Visit: Payer: Medicare Other

## 2013-10-18 DIAGNOSIS — E7800 Pure hypercholesterolemia, unspecified: Secondary | ICD-10-CM

## 2013-10-18 DIAGNOSIS — Z0000 Encounter for general adult medical examination without abnormal findings: Secondary | ICD-10-CM | POA: Diagnosis not present

## 2013-10-18 DIAGNOSIS — R3129 Other microscopic hematuria: Secondary | ICD-10-CM | POA: Diagnosis not present

## 2013-10-18 LAB — LIPID PANEL
CHOL/HDL RATIO: 2.8 ratio
Cholesterol: 137 mg/dL (ref 0–200)
HDL: 49 mg/dL (ref >39)
LDL CALC: 53 mg/dL (ref 0–99)
Triglycerides: 175 mg/dL — ABNORMAL HIGH (ref <150)
VLDL: 35 mg/dL (ref 0–40)

## 2013-10-18 LAB — GLUCOSE, RANDOM: Glucose, Bld: 92 mg/dL (ref 70–99)

## 2013-10-21 LAB — URINE CULTURE

## 2013-10-24 ENCOUNTER — Other Ambulatory Visit: Payer: Self-pay | Admitting: Family Medicine

## 2013-11-10 ENCOUNTER — Other Ambulatory Visit: Payer: Self-pay

## 2013-11-10 ENCOUNTER — Other Ambulatory Visit (HOSPITAL_BASED_OUTPATIENT_CLINIC_OR_DEPARTMENT_OTHER): Payer: Medicare Other

## 2013-11-10 DIAGNOSIS — I82409 Acute embolism and thrombosis of unspecified deep veins of unspecified lower extremity: Secondary | ICD-10-CM

## 2013-11-10 DIAGNOSIS — D6859 Other primary thrombophilia: Secondary | ICD-10-CM | POA: Diagnosis not present

## 2013-11-10 DIAGNOSIS — D6851 Activated protein C resistance: Secondary | ICD-10-CM

## 2013-11-10 LAB — CBC WITH DIFFERENTIAL/PLATELET
BASO%: 0.8 % (ref 0.0–2.0)
Basophils Absolute: 0 10*3/uL (ref 0.0–0.1)
EOS%: 2.9 % (ref 0.0–7.0)
Eosinophils Absolute: 0.1 10*3/uL (ref 0.0–0.5)
HCT: 32.7 % — ABNORMAL LOW (ref 34.8–46.6)
HGB: 11.2 g/dL — ABNORMAL LOW (ref 11.6–15.9)
LYMPH%: 44.6 % (ref 14.0–49.7)
MCH: 33 pg (ref 25.1–34.0)
MCHC: 34.1 g/dL (ref 31.5–36.0)
MCV: 96.7 fL (ref 79.5–101.0)
MONO#: 0.4 10*3/uL (ref 0.1–0.9)
MONO%: 8.5 % (ref 0.0–14.0)
NEUT#: 2.1 10*3/uL (ref 1.5–6.5)
NEUT%: 43.2 % (ref 38.4–76.8)
Platelets: 188 10*3/uL (ref 145–400)
RBC: 3.39 10*6/uL — AB (ref 3.70–5.45)
RDW: 13.2 % (ref 11.2–14.5)
WBC: 4.9 10*3/uL (ref 3.9–10.3)
lymph#: 2.2 10*3/uL (ref 0.9–3.3)

## 2013-11-10 MED ORDER — RIVAROXABAN 20 MG PO TABS: 20.0000 mg | ORAL_TABLET | Freq: Every day | ORAL | Status: DC

## 2013-11-10 NOTE — Telephone Encounter (Signed)
Informed pt we are filling her xarelto Rx thru June

## 2013-11-15 ENCOUNTER — Other Ambulatory Visit (INDEPENDENT_AMBULATORY_CARE_PROVIDER_SITE_OTHER): Payer: Medicare Other

## 2013-11-15 DIAGNOSIS — R8290 Unspecified abnormal findings in urine: Secondary | ICD-10-CM

## 2013-11-15 DIAGNOSIS — R319 Hematuria, unspecified: Secondary | ICD-10-CM | POA: Diagnosis not present

## 2013-11-15 DIAGNOSIS — R82998 Other abnormal findings in urine: Secondary | ICD-10-CM | POA: Diagnosis not present

## 2013-11-15 LAB — POCT URINALYSIS DIPSTICK
Bilirubin, UA: NEGATIVE
Glucose, UA: NEGATIVE
Ketones, UA: NEGATIVE
LEUKOCYTES UA: NEGATIVE
Nitrite, UA: POSITIVE
PH UA: 5
SPEC GRAV UA: 1.015
UROBILINOGEN UA: NEGATIVE

## 2013-11-15 NOTE — Addendum Note (Signed)
Addended by: Armanda Magic on: 11/15/2013 02:33 PM   Modules accepted: Orders

## 2013-11-17 ENCOUNTER — Ambulatory Visit (HOSPITAL_COMMUNITY)
Admission: RE | Admit: 2013-11-17 | Discharge: 2013-11-17 | Disposition: A | Discharge status: Home / Self Care | Payer: Medicare Other | Source: Ambulatory Visit | Attending: Family Medicine | Admitting: Family Medicine

## 2013-11-17 DIAGNOSIS — Z1231 Encounter for screening mammogram for malignant neoplasm of breast: Secondary | ICD-10-CM | POA: Diagnosis not present

## 2013-11-17 LAB — URINE CULTURE: Colony Count: >=100000

## 2013-11-18 ENCOUNTER — Other Ambulatory Visit: Payer: Self-pay | Admitting: *Deleted

## 2013-11-18 ENCOUNTER — Telehealth: Payer: Self-pay | Admitting: Family Medicine

## 2013-11-18 MED ORDER — SULFAMETHOXAZOLE-TMP DS 800-160 MG PO TABS: 1.0000 | ORAL_TABLET | Freq: Two times a day (BID) | ORAL | Status: DC

## 2013-11-18 NOTE — Telephone Encounter (Signed)
Spoke to patient concerning urine results and we scheduled her 2 week reck appt for 12/05/13 (because she is OOT and will not start the medication until she returns on 11/20/13)  Pt wants Bactrim Rx sent to [facility]

## 2013-12-05 ENCOUNTER — Other Ambulatory Visit (INDEPENDENT_AMBULATORY_CARE_PROVIDER_SITE_OTHER): Payer: Medicare Other

## 2013-12-05 DIAGNOSIS — R319 Hematuria, unspecified: Secondary | ICD-10-CM

## 2013-12-05 LAB — POCT URINALYSIS DIPSTICK
Bilirubin, UA: NEGATIVE
Glucose, UA: NEGATIVE
KETONES UA: NEGATIVE
Leukocytes, UA: NEGATIVE
Nitrite, UA: NEGATIVE
PROTEIN UA: NEGATIVE
SPEC GRAV UA: 1.01
Urobilinogen, UA: NEGATIVE
pH, UA: 5

## 2013-12-08 ENCOUNTER — Encounter: Payer: Self-pay | Admitting: Family Medicine

## 2013-12-08 LAB — CULTURE, URINE COMPREHENSIVE

## 2013-12-09 ENCOUNTER — Encounter: Payer: Self-pay | Admitting: Family Medicine

## 2014-01-09 DIAGNOSIS — N3020 Other chronic cystitis without hematuria: Secondary | ICD-10-CM | POA: Diagnosis not present

## 2014-01-09 DIAGNOSIS — R3129 Other microscopic hematuria: Secondary | ICD-10-CM | POA: Diagnosis not present

## 2014-01-09 DIAGNOSIS — R350 Frequency of micturition: Secondary | ICD-10-CM | POA: Diagnosis not present

## 2014-01-17 DIAGNOSIS — R3129 Other microscopic hematuria: Secondary | ICD-10-CM | POA: Diagnosis not present

## 2014-01-18 DIAGNOSIS — M129 Arthropathy, unspecified: Secondary | ICD-10-CM | POA: Diagnosis not present

## 2014-01-19 ENCOUNTER — Encounter: Payer: Self-pay | Admitting: *Deleted

## 2014-01-23 ENCOUNTER — Ambulatory Visit (HOSPITAL_COMMUNITY)
Admission: RE | Admit: 2014-01-23 | Discharge: 2014-01-23 | Disposition: A | Discharge status: Home / Self Care | Payer: Medicare Other | Source: Ambulatory Visit | Attending: Internal Medicine | Admitting: Internal Medicine

## 2014-01-23 ENCOUNTER — Other Ambulatory Visit: Payer: Self-pay | Admitting: Family Medicine

## 2014-01-23 ENCOUNTER — Telehealth: Payer: Self-pay | Admitting: Medical Oncology

## 2014-01-23 DIAGNOSIS — D6851 Activated protein C resistance: Secondary | ICD-10-CM

## 2014-01-23 DIAGNOSIS — Z86718 Personal history of other venous thrombosis and embolism: Secondary | ICD-10-CM | POA: Diagnosis not present

## 2014-01-23 DIAGNOSIS — I82409 Acute embolism and thrombosis of unspecified deep veins of unspecified lower extremity: Secondary | ICD-10-CM

## 2014-01-23 DIAGNOSIS — R599 Enlarged lymph nodes, unspecified: Secondary | ICD-10-CM | POA: Insufficient documentation

## 2014-01-23 DIAGNOSIS — D6859 Other primary thrombophilia: Secondary | ICD-10-CM | POA: Diagnosis not present

## 2014-01-23 NOTE — Telephone Encounter (Signed)
[facility] vascular lab called to report pt negative for DVT. Dr. Juliann Mule notified.

## 2014-01-23 NOTE — Progress Notes (Signed)
Bilateral lower extremity venous duplex:  No evidence of DVT, superficial thrombosis, or Baker's Cyst.   

## 2014-01-26 ENCOUNTER — Ambulatory Visit (HOSPITAL_BASED_OUTPATIENT_CLINIC_OR_DEPARTMENT_OTHER): Payer: Medicare Other | Admitting: Internal Medicine

## 2014-01-26 ENCOUNTER — Telehealth: Payer: Self-pay | Admitting: Internal Medicine

## 2014-01-26 ENCOUNTER — Other Ambulatory Visit (HOSPITAL_BASED_OUTPATIENT_CLINIC_OR_DEPARTMENT_OTHER): Payer: Medicare Other

## 2014-01-26 VITALS — BP 124/56 | HR 62 | Temp 98.2°F | Resp 18 | Ht 64.0 in | Wt 132.6 lb

## 2014-01-26 DIAGNOSIS — D6859 Other primary thrombophilia: Secondary | ICD-10-CM

## 2014-01-26 DIAGNOSIS — D6851 Activated protein C resistance: Secondary | ICD-10-CM

## 2014-01-26 DIAGNOSIS — I824Z9 Acute embolism and thrombosis of unspecified deep veins of unspecified distal lower extremity: Secondary | ICD-10-CM

## 2014-01-26 DIAGNOSIS — R599 Enlarged lymph nodes, unspecified: Secondary | ICD-10-CM

## 2014-01-26 DIAGNOSIS — D539 Nutritional anemia, unspecified: Secondary | ICD-10-CM | POA: Diagnosis not present

## 2014-01-26 DIAGNOSIS — I82409 Acute embolism and thrombosis of unspecified deep veins of unspecified lower extremity: Secondary | ICD-10-CM

## 2014-01-26 LAB — CBC WITH DIFFERENTIAL/PLATELET
BASO%: 0.3 % (ref 0.0–2.0)
Basophils Absolute: 0 10*3/uL (ref 0.0–0.1)
EOS ABS: 0.2 10*3/uL (ref 0.0–0.5)
EOS%: 2 % (ref 0.0–7.0)
HCT: 34.2 % — ABNORMAL LOW (ref 34.8–46.6)
HGB: 11.2 g/dL — ABNORMAL LOW (ref 11.6–15.9)
LYMPH#: 3.2 10*3/uL (ref 0.9–3.3)
LYMPH%: 42.1 % (ref 14.0–49.7)
MCH: 33 pg (ref 25.1–34.0)
MCHC: 32.7 g/dL (ref 31.5–36.0)
MCV: 100.9 fL (ref 79.5–101.0)
MONO#: 0.7 10*3/uL (ref 0.1–0.9)
MONO%: 9.6 % (ref 0.0–14.0)
NEUT%: 46 % (ref 38.4–76.8)
NEUTROS ABS: 3.5 10*3/uL (ref 1.5–6.5)
PLATELETS: 184 10*3/uL (ref 145–400)
RBC: 3.39 10*6/uL — AB (ref 3.70–5.45)
RDW: 13.7 % (ref 11.2–14.5)
WBC: 7.6 10*3/uL (ref 3.9–10.3)

## 2014-01-26 LAB — BASIC METABOLIC PANEL (CC13)
Anion Gap: 9 mEq/L (ref 3–11)
BUN: 18.5 mg/dL (ref 7.0–26.0)
CO2: 29 meq/L (ref 22–29)
Calcium: 9.4 mg/dL (ref 8.4–10.4)
Chloride: 106 mEq/L (ref 98–109)
Creatinine: 0.8 mg/dL (ref 0.6–1.1)
GLUCOSE: 94 mg/dL (ref 70–140)
POTASSIUM: 4.6 meq/L (ref 3.5–5.1)
SODIUM: 144 meq/L (ref 136–145)

## 2014-01-26 NOTE — Telephone Encounter (Signed)
gave pt appt for September 2015 lab and MD

## 2014-01-26 NOTE — Progress Notes (Signed)
[facility] OFFICE PROGRESS NOTE  KNAPP,EVE A, MD [place] [address]  DIAGNOSIS: Acute DVT (deep venous thrombosis) - Plan: CBC with Differential  Heterozygous factor V Leiden mutation - Plan: CBC with Differential  Chief Complaint  Patient presents with  . Acute DVT (deep venous thrombosis)    CURRENT TREATMENT: Xalreto 20 mg daily.   INTERVAL HISTORY: Dorothy Rojas 68 y.o. female who was referred by her PCP Dr. Tomi Bamberger for further evaluation and management for her Left leg DVT (07/28/2013) and heterozygous for factor V mutation. She was last seen by me on 10/13/2013.   As previously reported, she reported to the emergency room on 07/28/2013 secondary to left leg pain and swelling. She reported 3 days prior to this presentation that she flew back from [place] on a one-hour flight. She noticed that she had some pain in the left knee intermittently with th pain being worse on Tuesday or 2 days prior to presentation. On that evening, she noticed an indentation from the top of her sock and a really firm swollen area above the level of sock medially involving the upper medial medial calf. She denied any redness at that time. In addition, she noticed that it felt firm and was very tender. So she went to bed and that evening. By the morning, one day prior to admission she had little improvement. She noted that it was hard and remain very tender. She took Tylenol, Motrin and Aleve without any relief of his pain. Due to persistence and pain and swelling of her left leg, she reported to the emergency room for further evaluation on 07/28/2013 duplex ultrasound of the lower left extremity revealed deep venous thrombosis of left peroneal vein. She was started on Xalreto 15 mg bid. A hypercoagulable work-up was obtained revealing that she was had heterozygousity for factor V mutation and the remaining was negative. She was referred to our office for further  evaluation and management.   Today, she continues to report that her left leg pain has nearly resolved. She started xalreto 20 mg daily on 08/15/2013. She denies any bleeding. She is up-to-date for her screening exams including a colonoscopy in 2006, mammogram in 2014 which was negative.    MEDICAL HISTORY: Past Medical History  Diagnosis Date  . Hypertension   . Elevated cholesterol   . Osteopenia     last DEXA 10/2009  . Former smoker     Quit in 1984  . Mixed stress and urge urinary incontinence     Dr. McDiarmid  . Torn meniscus     L knee  . Heterozygous factor V Leiden mutation 07/2013  . DVT (deep venous thrombosis) 07/2013    L peroneal v  . Microscopic hematuria 01/17/2014    -chronic colonization    INTERIM HISTORY: has Essential hypertension, benign; Pure hypercholesterolemia; Multinodular goiter; Carotid stenosis; OAB (overactive bladder); Osteopenia; Acute DVT (deep venous thrombosis); and Heterozygous factor V Leiden mutation on her problem list.    ALLERGIES:  is allergic to naprosyn.  MEDICATIONS: has a current medication list which includes the following prescription(s): aspirin, calcium carbonate-vitamin d, lisinopril-hydrochlorothiazide, oxybutynin, rivaroxaban, and simvastatin.  SURGICAL HISTORY:  Past Surgical History  Procedure Laterality Date  . Tonsillectomy    . Total abdominal hysterectomy w/ bilateral salpingoophorectomy  04/1999  . Appendectomy  1973  . Ovarian cyst removal  1973    LEFT OVARIAN  . Incontinence surgery  04/1999    Re-do at [facility] for sling  complications in [id number]  . Neuroma surgery  1980's    bilateral feet  . Lateral epicondyle release      Right  . Cystocele repair  11/2010    Dr. McDiarmid  . Knee arthroscopy Right 04/05/13    Dr. Onnie Graham  . Cystoscopy  01/17/2014    normal    REVIEW OF SYSTEMS:   Constitutional: Denies fevers, chills or abnormal weight loss Eyes: Denies blurriness of vision Ears, nose, mouth, throat, and  face: Denies mucositis or sore throat Respiratory: Denies cough, dyspnea or wheezes Cardiovascular: Denies palpitation, chest discomfort or lower extremity swelling Gastrointestinal:  Denies nausea, heartburn or change in bowel habits Skin: Denies abnormal skin rashes Lymphatics: Denies new lymphadenopathy or easy bruising Neurological:Denies numbness, tingling or new weaknesses Behavioral/Psych: Mood is stable, no new changes  All other systems were reviewed with the patient and are negative.  PHYSICAL EXAMINATION: ECOG PERFORMANCE STATUS: 0 - Asymptomatic  Blood pressure 124/56, pulse 62, temperature 98.2 F (36.8 C), temperature source Oral, resp. rate 18, height 5\' 4"  (1.626 m), weight 132 lb 9.6 oz (60.147 kg).  GENERAL:alert, no distress and comfortable; well developed and well nourished.  SKIN: skin color, texture, turgor are normal, no rashes or significant lesions EYES: normal, Conjunctiva are pink and non-injected, sclera clear OROPHARYNX:no exudate, no erythema and lips, buccal mucosa, and tongue normal  NECK: supple, thyroid normal size, non-tender, without nodularity LYMPH:  no palpable lymphadenopathy in the cervical, axillary or supraclavicular LUNGS: clear to auscultation with normal breathing effort, no wheezes or rhonchi HEART: regular rate & rhythm and no murmurs and no lower extremity edema ABDOMEN:abdomen soft, non-tender and normal bowel sounds Musculoskeletal:no cyanosis of digits and no clubbing  NEURO: alert & oriented x 3 with fluent speech, no focal motor/sensory deficits  Labs:  Lab Results  Component Value Date   WBC 7.6 01/26/2014   HGB 11.2* 01/26/2014   HCT 34.2* 01/26/2014   MCV 100.9 01/26/2014   PLT 184 01/26/2014   NEUTROABS 3.5 01/26/2014      Chemistry      Component Value Date/Time   NA 144 01/26/2014 0809   NA 141 08/14/2013 0116   K 4.6 01/26/2014 0809   K 3.8 08/14/2013 0116   CL 101 08/14/2013 0116   CO2 29 01/26/2014 0809   CO2 28 08/14/2013  0116   BUN 18.5 01/26/2014 0809   BUN 18 08/14/2013 0116   CREATININE 0.8 01/26/2014 0809   CREATININE 0.79 08/14/2013 0116   CREATININE 0.71 10/08/2012 0850      Component Value Date/Time   CALCIUM 9.4 01/26/2014 0809   CALCIUM 9.7 08/14/2013 0116   ALKPHOS 57 10/13/2013 0910   ALKPHOS 62 08/14/2013 0116   AST 21 10/13/2013 0910   AST 21 08/14/2013 0116   ALT 23 10/13/2013 0910   ALT 22 08/14/2013 0116   BILITOT 0.42 10/13/2013 0910   BILITOT 0.3 08/14/2013 0116      CBC:  Recent Labs Lab 01/26/14 0809  WBC 7.6  NEUTROABS 3.5  HGB 11.2*  HCT 34.2*  MCV 100.9  PLT 184   Studies:  No results found.   RADIOGRAPHIC STUDIES: Lower extremity duplex  02-13-14 Summary:  - No evidence of deep vein or superficial thrombosis involving the right lower extremity and left lower extremity. Enlarged inguinal lymph nodes are noted bilaterally. - No evidence of Baker&'s cyst on the right or left.   ASSESSMENT: Dorothy Rojas 68 y.o. female with a history of Acute DVT (deep  venous thrombosis) - Plan: CBC with Differential  Heterozygous factor V Leiden mutation - Plan: CBC with Differential   PLAN:  1. Distal Left leg DVT of peroneal vein, non-provoked (07/28/2013).  -- Based on the location being a distal vein, it might be reasonable to treat for at least 3 months. If bleeding risk minimal, due to a positive heterozygous for Factor V Mutation, it would also be reasonable to treat for 6 months. Her preference was for six months. Today, will be her the end of six months.  Her repeat lower extremity was negative for DVT.   It did show some mild inguinal lymphadenopathy.  We instructed her to stop anticoagulation today.    2. Heterozygous for Factor V Mutation.  -- We discussed in depth that her heterozygous for Factor V mutation is a lower risk thrombophlia with increase of venous thromboembolism (VTE) of approximately sevenfold increased risk of thrombosis, while homozygosity for the mutation  increases thrombosis risk 80-fold. Most clinicians do NOT indefinitely anticoagulate patients who have a first episode of provoked VTE and "lower risk" thrombophilias (eg, factor V Leyden deficiency).  --We then discussed that testing for factor V leiden is recommended for all individuals with venous thrombosis or history of a thrombic event. We believe that the benefits of screening are likely to be significant in the asymptomatic family member with thrombophilia only if there is a strong family history of VTE (ie, multiple first-degree relatives with thrombotic events prior to age 74). Her first clot is at the age 47 and so her children should not be tested if they are asymptomatic.   3. Bilateral lymphadenopathy, inguinal. --She denies any symptoms.  She does not have palpable axillary, or cervical lymph nodes.   She denies fevers, chills or night sweats or weight changes.   4. Anemia, macrocytic. --Her anemia is mild and she is asymptomatic. MCV is 100.9.  If persistent, we will check folate and vitamin b12.    5. Follow-up.  -- She should follow up for repeat labs in 3 months for a symptom visit.     All questions were answered. The patient knows to call the clinic with any problems, questions or concerns. We can certainly see the patient much sooner if necessary.  I spent 15 minutes counseling the patient face to face. The total time spent in the appointment was 25 minutes.    CHISM, DAVID, MD 01/26/2014 1:02 PM

## 2014-02-20 ENCOUNTER — Encounter: Payer: Self-pay | Admitting: Family Medicine

## 2014-02-27 ENCOUNTER — Telehealth: Payer: Self-pay | Admitting: Family Medicine

## 2014-02-27 DIAGNOSIS — E7800 Pure hypercholesterolemia, unspecified: Secondary | ICD-10-CM

## 2014-02-27 DIAGNOSIS — I10 Essential (primary) hypertension: Secondary | ICD-10-CM

## 2014-02-27 NOTE — Telephone Encounter (Signed)
Future orders entered. 

## 2014-02-27 NOTE — Telephone Encounter (Signed)
Pt called and scheduled a medcheck in September. Pt is requesting she come in prior for labs. Please advise.

## 2014-02-28 DIAGNOSIS — H2510 Age-related nuclear cataract, unspecified eye: Secondary | ICD-10-CM | POA: Diagnosis not present

## 2014-02-28 DIAGNOSIS — H524 Presbyopia: Secondary | ICD-10-CM | POA: Diagnosis not present

## 2014-03-06 DIAGNOSIS — H26019 Infantile and juvenile cortical, lamellar, or zonular cataract, unspecified eye: Secondary | ICD-10-CM | POA: Diagnosis not present

## 2014-03-06 DIAGNOSIS — H2510 Age-related nuclear cataract, unspecified eye: Secondary | ICD-10-CM | POA: Diagnosis not present

## 2014-04-18 ENCOUNTER — Other Ambulatory Visit: Payer: Medicare Other

## 2014-04-18 DIAGNOSIS — I82409 Acute embolism and thrombosis of unspecified deep veins of unspecified lower extremity: Secondary | ICD-10-CM

## 2014-04-18 DIAGNOSIS — E7800 Pure hypercholesterolemia, unspecified: Secondary | ICD-10-CM | POA: Diagnosis not present

## 2014-04-18 DIAGNOSIS — D6851 Activated protein C resistance: Secondary | ICD-10-CM

## 2014-04-18 DIAGNOSIS — I10 Essential (primary) hypertension: Secondary | ICD-10-CM

## 2014-04-18 LAB — LIPID PANEL
CHOL/HDL RATIO: 2.9 ratio
CHOLESTEROL: 127 mg/dL (ref 0–200)
HDL: 44 mg/dL (ref >39)
LDL Cholesterol: 49 mg/dL (ref 0–99)
Triglycerides: 172 mg/dL — ABNORMAL HIGH (ref <150)
VLDL: 34 mg/dL (ref 0–40)

## 2014-04-18 LAB — COMPREHENSIVE METABOLIC PANEL
ALT: 22 U/L (ref 0–35)
AST: 18 U/L (ref 0–37)
Albumin: 4.3 g/dL (ref 3.5–5.2)
Alkaline Phosphatase: 46 U/L (ref 39–117)
BILIRUBIN TOTAL: 0.6 mg/dL (ref 0.2–1.2)
BUN: 17 mg/dL (ref 6–23)
CHLORIDE: 103 meq/L (ref 96–112)
CO2: 29 meq/L (ref 19–32)
CREATININE: 0.75 mg/dL (ref 0.50–1.10)
Calcium: 9.4 mg/dL (ref 8.4–10.5)
Glucose, Bld: 92 mg/dL (ref 70–99)
Potassium: 4.4 mEq/L (ref 3.5–5.3)
Sodium: 141 mEq/L (ref 135–145)
Total Protein: 6.9 g/dL (ref 6.0–8.3)

## 2014-04-19 ENCOUNTER — Other Ambulatory Visit: Payer: Self-pay

## 2014-04-19 DIAGNOSIS — I82409 Acute embolism and thrombosis of unspecified deep veins of unspecified lower extremity: Secondary | ICD-10-CM

## 2014-04-19 DIAGNOSIS — M25569 Pain in unspecified knee: Secondary | ICD-10-CM | POA: Diagnosis not present

## 2014-04-20 ENCOUNTER — Ambulatory Visit (INDEPENDENT_AMBULATORY_CARE_PROVIDER_SITE_OTHER): Payer: Medicare Other | Admitting: Family Medicine

## 2014-04-20 ENCOUNTER — Encounter: Payer: Self-pay | Admitting: Family Medicine

## 2014-04-20 VITALS — BP 122/68 | HR 80 | Ht 64.0 in | Wt 130.0 lb

## 2014-04-20 DIAGNOSIS — I82402 Acute embolism and thrombosis of unspecified deep veins of left lower extremity: Secondary | ICD-10-CM

## 2014-04-20 DIAGNOSIS — N318 Other neuromuscular dysfunction of bladder: Secondary | ICD-10-CM

## 2014-04-20 DIAGNOSIS — I10 Essential (primary) hypertension: Secondary | ICD-10-CM

## 2014-04-20 DIAGNOSIS — I82409 Acute embolism and thrombosis of unspecified deep veins of unspecified lower extremity: Secondary | ICD-10-CM

## 2014-04-20 DIAGNOSIS — E7800 Pure hypercholesterolemia, unspecified: Secondary | ICD-10-CM

## 2014-04-20 DIAGNOSIS — I6529 Occlusion and stenosis of unspecified carotid artery: Secondary | ICD-10-CM

## 2014-04-20 DIAGNOSIS — E042 Nontoxic multinodular goiter: Secondary | ICD-10-CM

## 2014-04-20 DIAGNOSIS — I658 Occlusion and stenosis of other precerebral arteries: Secondary | ICD-10-CM

## 2014-04-20 DIAGNOSIS — N3281 Overactive bladder: Secondary | ICD-10-CM

## 2014-04-20 DIAGNOSIS — M899 Disorder of bone, unspecified: Secondary | ICD-10-CM

## 2014-04-20 DIAGNOSIS — I6523 Occlusion and stenosis of bilateral carotid arteries: Secondary | ICD-10-CM

## 2014-04-20 DIAGNOSIS — M8580 Other specified disorders of bone density and structure, unspecified site: Secondary | ICD-10-CM

## 2014-04-20 DIAGNOSIS — M949 Disorder of cartilage, unspecified: Secondary | ICD-10-CM

## 2014-04-20 MED ORDER — SIMVASTATIN 40 MG PO TABS: ORAL_TABLET | ORAL | Status: DC

## 2014-04-20 MED ORDER — LISINOPRIL-HYDROCHLOROTHIAZIDE 20-12.5 MG PO TABS: ORAL_TABLET | ORAL | Status: DC

## 2014-04-20 NOTE — Progress Notes (Signed)
Chief Complaint  Patient presents with  . med check    med check  Patient presents for routine follow-up/med check.  She is complaining of burping a lot recently.  Denies any significant changes in diet.  Denies heartburn, dysphagia.  She is also having some postnasal drip, throat-clearing and raspy voice recently.  Hypertension follow-up:  Blood pressures elsewhere are 120-130's/60's-70's.  Denies dizziness, chest pain.  Denies side effects of medications. She had some short-lived headaches, but none in the last few weeks.  She intermittently gets some "twingy, fleeting" chest pains between her breasts.  Occurs at rest, not related to eating.  Hematuria--underwent eval by Dr. McDiarmid with normal cystoscopy.  She had CT which showed some cysts--he felt all was okay.  Bladder is colonized with bacteria, and will always show bacteria in urine.  Currently she is not having any symptoms.  Urinary incontinence:  She did better on Myrbetriq and PTNS than on oxybutynin, however the former was not covered by insurance.  Oxybutynin keeps it somewhat controlled.  Hyperlipidemia follow-up:  Patient is reportedly following a low-fat, low cholesterol diet.  Compliant with medications and denies medication side effects. She occasionally gets some leg cramps, more related to the shoes she is wearing.  DVT--she completed 6 months of xarelto.  Has f/u scheduled with hematologist with plans for f/u ultrasound.  If any recurrence, will need lifelong treatment, but if u/s is normal, she states he said she could remain off blood thinners.  She is taking 81mg  of ASA daily.  Past Medical History  Diagnosis Date  . Hypertension   . Elevated cholesterol   . Osteopenia     last DEXA 10/2009  . Former smoker     Quit in 1984  . Mixed stress and urge urinary incontinence     Dr. McDiarmid  . Torn meniscus     L knee  . Heterozygous factor V Leiden mutation 07/2013  . DVT (deep venous thrombosis) 07/2013    L  peroneal v  . Microscopic hematuria 01/17/2014    -chronic colonization   Past Surgical History  Procedure Laterality Date  . Tonsillectomy    . Total abdominal hysterectomy w/ bilateral salpingoophorectomy  04/1999  . Appendectomy  1973  . Ovarian cyst removal  1973    LEFT OVARIAN  . Incontinence surgery  04/1999    Re-do at [facility] for sling complications in 6712  . Neuroma surgery  1980's    bilateral feet  . Lateral epicondyle release      Right  . Cystocele repair  11/2010    Dr. McDiarmid  . Knee arthroscopy Right 04/05/13    Dr. Onnie Graham  . Cystoscopy  01/17/2014    normal   History   Social History  . Marital Status: Married    Spouse Name: N/A    Number of Children: 3  . Years of Education: N/A   Occupational History  .     Social History Main Topics  . Smoking status: Former Smoker    Quit date: 08/11/1985  . Smokeless tobacco: Never Used  . Alcohol Use: Yes     Comment: 1 glass of wine on weekends only (2-3/week)  . Drug Use: No  . Sexual Activity: Not Currently   Other Topics Concern  . Not on file   Social History Narrative   Married.  Lives with husband.  Children live in [place], [place].  Youngest child was killed in [place].  3 stepchildren are all local. 1  dog (muffin lost vision 2015 due to retinal detachment)   Outpatient Encounter Prescriptions as of 04/20/2014  Medication Sig Note  . aspirin 81 MG tablet Take 81 mg by mouth daily.   . Calcium Carbonate-Vitamin D (CALCIUM 600+D) 600-400 MG-UNIT per tablet Take 1 tablet by mouth daily.   [name] lisinopril-hydrochlorothiazide (PRINZIDE,ZESTORETIC) 20-12.5 MG per tablet TAKE 1 TABLET ONCE DAILY.   [name] oxybutynin (DITROPAN-XL) 10 MG 24 hr tablet Take 10 mg by mouth 2 (two) times daily.    . simvastatin (ZOCOR) 40 MG tablet TAKE ONE TABLET AT BEDTIME.   . [DISCONTINUED] Rivaroxaban (XARELTO) 20 MG TABS tablet Take 1 tablet (20 mg total) by mouth daily with supper. 04/20/2014: She was taken off in June by hematologist    Allergies  Allergen Reactions  . Naprosyn [Naproxen] Rash   ROS:  Denies fevers, chills, headaches, dizziness, exertional chest pain, palpitations, cough, shortness of breath, nausea, vomiting, bowel changes, urinary complaints (see HPI).  +mild congestion/PND. +L TMJ problems--wearing bite guard. +belching.  No leg swelling, pain or other concerns.  PHYSICAL EXAM: BP 122/68  Pulse 80  Ht 5\' 4"  (1.626 m)  Wt 130 lb (58.968 kg)  BMI 22.30 kg/m2 Well developed, pleasant female in no distress HEENT:  PERRL, EOMI, conjunctiva clear. TM's and EAC's normal.  Nasal mucosa is moderately edematous, clear mucus.  Sinuses nontender, OP is clear.  L jaw clicked/popped with opening mouth wide.  Mildly tender at left TMJ Neck: no lymphadenopathy or mass.  Thyroid is mildly diffusely enlarged, no nodules/masses.  No carotid bruit Heart: regular rate and rhythm, no murmur Lungs: clear bilaterally Abdomen: soft, nontender, no organomegaly or mass Extremities: no edema, 2+ pulses.  No cords, tenderness. Negative homan    Chemistry      Component Value Date/Time   NA 141 04/18/2014 0832   NA 144 01/26/2014 0809   K 4.4 04/18/2014 0832   K 4.6 01/26/2014 0809   CL 103 04/18/2014 0832   CO2 29 04/18/2014 0832   CO2 29 01/26/2014 0809   BUN 17 04/18/2014 0832   BUN 18.5 01/26/2014 0809   CREATININE 0.75 04/18/2014 0832   CREATININE 0.8 01/26/2014 0809   CREATININE 0.79 08/14/2013 0116      Component Value Date/Time   CALCIUM 9.4 04/18/2014 0832   CALCIUM 9.4 01/26/2014 0809   ALKPHOS 46 04/18/2014 0832   ALKPHOS 57 10/13/2013 0910   AST 18 04/18/2014 0832   AST 21 10/13/2013 0910   ALT 22 04/18/2014 0832   ALT 23 10/13/2013 0910   BILITOT 0.6 04/18/2014 0832   BILITOT 0.42 10/13/2013 0910     Glucose 92  Lab Results  Component Value Date   CHOL 127 04/18/2014   HDL 44 04/18/2014   LDLCALC 49 04/18/2014   TRIG 172* 04/18/2014   CHOLHDL 2.9 04/18/2014   ASSESSMENT/PLAN:  Essential hypertension, benign - controlled - Plan:  lisinopril-hydrochlorothiazide (PRINZIDE,ZESTORETIC) 20-12.5 MG per tablet  Pure hypercholesterolemia - LDL at goal TG slightly high.  Lowfat diet, trial of omega-3 fish oil - Plan: simvastatin (ZOCOR) 40 MG tablet  Carotid stenosis, bilateral - continue ASA, lipid and BP control  Acute DVT (deep venous thrombosis), left - resolved.  f/u scan ordered.  f/u with hematology as scheduled  Osteopenia - DEXA due 12/2014; continue calcium, Vit D, weight-bearing exercise  Multinodular goiter - stable, asymptomatic  OAB (overactive bladder) - borderline control with current measures.  Per urology  Belching--reviewed diet.  She will pay closer attention to  foods which might trigger, and avoid.  F/u 6 mos at CPE

## 2014-04-20 NOTE — Patient Instructions (Signed)
Continue your current medications. Consider trying omega-3 fish oil (up to 3000-4000mg  daily) to help lower the triglycerides further. Pay attention to which foods are triggering your belching, and avoid.  Get high dose flu shot when available (call in early October)

## 2014-04-28 ENCOUNTER — Other Ambulatory Visit (HOSPITAL_BASED_OUTPATIENT_CLINIC_OR_DEPARTMENT_OTHER): Payer: Medicare Other

## 2014-04-28 ENCOUNTER — Ambulatory Visit (HOSPITAL_BASED_OUTPATIENT_CLINIC_OR_DEPARTMENT_OTHER): Payer: Medicare Other | Admitting: Hematology

## 2014-04-28 ENCOUNTER — Telehealth: Payer: Self-pay | Admitting: Hematology

## 2014-04-28 VITALS — BP 137/60 | HR 66 | Temp 98.5°F | Resp 19 | Ht 64.0 in | Wt 128.4 lb

## 2014-04-28 DIAGNOSIS — D6859 Other primary thrombophilia: Secondary | ICD-10-CM

## 2014-04-28 DIAGNOSIS — I82402 Acute embolism and thrombosis of unspecified deep veins of left lower extremity: Secondary | ICD-10-CM

## 2014-04-28 DIAGNOSIS — D509 Iron deficiency anemia, unspecified: Secondary | ICD-10-CM | POA: Diagnosis not present

## 2014-04-28 DIAGNOSIS — I824Z9 Acute embolism and thrombosis of unspecified deep veins of unspecified distal lower extremity: Secondary | ICD-10-CM

## 2014-04-28 DIAGNOSIS — D6851 Activated protein C resistance: Secondary | ICD-10-CM

## 2014-04-28 DIAGNOSIS — I82409 Acute embolism and thrombosis of unspecified deep veins of unspecified lower extremity: Secondary | ICD-10-CM

## 2014-04-28 LAB — CBC WITH DIFFERENTIAL/PLATELET
BASO%: 0.1 % (ref 0.0–2.0)
Basophils Absolute: 0 10*3/uL (ref 0.0–0.1)
EOS ABS: 0.1 10*3/uL (ref 0.0–0.5)
EOS%: 1 % (ref 0.0–7.0)
HCT: 36.2 % (ref 34.8–46.6)
HGB: 11.8 g/dL (ref 11.6–15.9)
LYMPH%: 30.8 % (ref 14.0–49.7)
MCH: 33.1 pg (ref 25.1–34.0)
MCHC: 32.6 g/dL (ref 31.5–36.0)
MCV: 101.4 fL — AB (ref 79.5–101.0)
MONO#: 0.9 10*3/uL (ref 0.1–0.9)
MONO%: 9.9 % (ref 0.0–14.0)
NEUT%: 58.2 % (ref 38.4–76.8)
NEUTROS ABS: 5 10*3/uL (ref 1.5–6.5)
Platelets: 208 10*3/uL (ref 145–400)
RBC: 3.57 10*6/uL — AB (ref 3.70–5.45)
RDW: 13.2 % (ref 11.2–14.5)
WBC: 8.7 10*3/uL (ref 3.9–10.3)
lymph#: 2.7 10*3/uL (ref 0.9–3.3)

## 2014-04-28 NOTE — Telephone Encounter (Signed)
per 9/18 pof return prn.

## 2014-04-28 NOTE — Patient Instructions (Signed)
Pt instructed for children to get tested for FVL mutation.

## 2014-04-28 NOTE — Progress Notes (Signed)
[facility] PROGRESS NOTE  [name], MD [place] [address]  DIAGNOSIS: DVT and factor V Leiden mutation heterozygous.  Chief Complaint  Patient presents with  . Follow-up    CURRENT TREATMENT: Xalreto 20 mg daily has been completed now on Aspirin 81 mg daily as thromboprophylaxis.  INTERVAL HISTORY: Dorothy Rojas 68 y.o. female who was referred by her PCP Dr. Tomi Bamberger for further evaluation and management for her Left leg DVT (07/28/2013) and heterozygous for factor V mutation. She was seen by Dr Juliann Mule on 01/26/2014 and today is first encounter with me and I reviewed the records extensively.   As previously reported, she reported to the emergency room on 07/28/2013 secondary to left leg pain and swelling. She reported 3 days prior to this presentation that she flew back from [place] on a one-hour flight. She noticed that she had some pain in the left knee intermittently with th pain being worse on Tuesday or 2 days prior to presentation. On that evening, she noticed an indentation from the top of her sock and a really firm swollen area above the level of sock medially involving the upper medial medial calf. She denied any redness at that time. In addition, she noticed that it felt firm and was very tender. So she went to bed and that evening. By the morning, one day prior to admission she had little improvement. She noted that it was hard and remain very tender. She took Tylenol, Motrin and Aleve without any relief of his pain. Due to persistence and pain and swelling of her left leg, she reported to the emergency room for further evaluation on 07/28/2013 duplex ultrasound of the lower left extremity revealed deep venous thrombosis of left peroneal vein. She was started on Xalreto 15 mg bid. A hypercoagulable work-up was obtained revealing that she was had heterozygousity for factor V mutation and the remaining was negative. She was  referred to our office for further evaluation and management.   She completed 6 months of xarelto and now just on baby aspirin. She denies any bleeding. She is up-to-date for her screening exams including a colonoscopy in 2006, mammogram in 2014 which was negative.    MEDICAL HISTORY: Past Medical History  Diagnosis Date  . Hypertension   . Elevated cholesterol   . Osteopenia     last DEXA 10/2009  . Former smoker     Quit in 1984  . Mixed stress and urge urinary incontinence     Dr. McDiarmid  . Torn meniscus     L knee  . Heterozygous factor V Leiden mutation 07/2013  . DVT (deep venous thrombosis) 07/2013    L peroneal v  . Microscopic hematuria 01/17/2014    -chronic colonization    INTERIM HISTORY: has Essential hypertension, benign; Pure hypercholesterolemia; Multinodular goiter; Carotid stenosis; OAB (overactive bladder); Osteopenia; Acute DVT (deep venous thrombosis); and Heterozygous factor V Leiden mutation on her problem list.    ALLERGIES:  is allergic to naprosyn.  MEDICATIONS: has a current medication list which includes the following prescription(s): aspirin, calcium carbonate-vitamin d, lisinopril-hydrochlorothiazide, oxybutynin, and simvastatin.  SURGICAL HISTORY:  Past Surgical History  Procedure Laterality Date  . Tonsillectomy    . Total abdominal hysterectomy w/ bilateral salpingoophorectomy  04/1999  . Appendectomy  1973  . Ovarian cyst removal  1973    LEFT OVARIAN  . Incontinence surgery  04/1999    Re-do at [facility] for sling complications in 4742  .  Neuroma surgery  1980's    bilateral feet  . Lateral epicondyle release      Right  . Cystocele repair  11/2010    Dr. McDiarmid  . Knee arthroscopy Right 04/05/13    Dr. Onnie Graham  . Cystoscopy  01/17/2014    normal   FAMILY HISTORY: 1 Brother tested and negative for FVL. 3 kids, 1 deceased from [place], 2 Alive ages 29 daughter who also have two daughters ages 12/9, and one son who is 34 with no kids. I  emphasized to her that her 2 kids should be tested and if positive their kids need to be tested as well. It can have implications for them as far as risk of surgery, long distance traveling and getting pregnant.  REVIEW OF SYSTEMS:   Constitutional: Denies fevers, chills or abnormal weight loss Eyes: Denies blurriness of vision Ears, nose, mouth, throat, and face: Denies mucositis or sore throat Respiratory: Denies cough, dyspnea or wheezes Cardiovascular: Denies palpitation, chest discomfort or lower extremity swelling Gastrointestinal:  Denies nausea, heartburn or change in bowel habits Skin: Denies abnormal skin rashes Lymphatics: Denies new lymphadenopathy or easy bruising Neurological:Denies numbness, tingling or new weaknesses Behavioral/Psych: Mood is stable, no new changes  All other systems were reviewed with the patient and are negative.  PHYSICAL EXAMINATION: ECOG PERFORMANCE STATUS: 0  Blood pressure 137/60, pulse 66, temperature 98.5 F (36.9 C), temperature source Oral, resp. rate 19, height 5\' 4"  (1.626 m), weight 128 lb 6.4 oz (58.242 kg).  GENERAL:alert, no distress and comfortable; well developed and well nourished.  SKIN: skin color, texture, turgor are normal, no rashes or significant lesions EYES: normal, Conjunctiva are pink and non-injected, sclera clear OROPHARYNX:no exudate, no erythema and lips, buccal mucosa, and tongue normal  NECK: supple, thyroid normal size, non-tender, without nodularity LYMPH:  no palpable lymphadenopathy in the cervical, axillary or supraclavicular LUNGS: clear to auscultation with normal breathing effort, no wheezes or rhonchi HEART: regular rate & rhythm and no murmurs and no lower extremity edema ABDOMEN:abdomen soft, non-tender and normal bowel sounds Musculoskeletal:no cyanosis of digits and no clubbing  NEURO: alert & oriented x 3 with fluent speech, no focal motor/sensory deficits  Labs:  Lab Results  Component Value Date    WBC 8.7 04/28/2014   HGB 11.8 04/28/2014   HCT 36.2 04/28/2014   MCV 101.4* 04/28/2014   PLT 208 04/28/2014   NEUTROABS 5.0 04/28/2014      Chemistry      Component Value Date/Time   NA 141 04/18/2014 0832   NA 144 01/26/2014 0809   K 4.4 04/18/2014 0832   K 4.6 01/26/2014 0809   CL 103 04/18/2014 0832   CO2 29 04/18/2014 0832   CO2 29 01/26/2014 0809   BUN 17 04/18/2014 0832   BUN 18.5 01/26/2014 0809   CREATININE 0.75 04/18/2014 0832   CREATININE 0.8 01/26/2014 0809   CREATININE 0.79 08/14/2013 0116      Component Value Date/Time   CALCIUM 9.4 04/18/2014 0832   CALCIUM 9.4 01/26/2014 0809   ALKPHOS 46 04/18/2014 0832   ALKPHOS 57 10/13/2013 0910   AST 18 04/18/2014 0832   AST 21 10/13/2013 0910   ALT 22 04/18/2014 0832   ALT 23 10/13/2013 0910   BILITOT 0.6 04/18/2014 0832   BILITOT 0.42 10/13/2013 0910      CBC:  Recent Labs Lab 04/28/14 0814  WBC 8.7  NEUTROABS 5.0  HGB 11.8  HCT 36.2  MCV 101.4*  PLT 208  Studies:  No results found.   RADIOGRAPHIC STUDIES: Lower extremity duplex  February 07, 2014 Summary:  - No evidence of deep vein or superficial thrombosis involving the right lower extremity and left lower extremity. Enlarged inguinal lymph nodes are noted bilaterally. - No evidence of Baker&'s cyst on the right or left.   ASSESSMENT: Dorothy Rojas 68 y.o. female with a history of DVT and FVL mutation heterozygous, s/p 6 months of Xarelto now on baby aspirin thromboprophylaxis doing well.  PLAN:  1. Distal Left leg DVT of peroneal vein, non-provoked (07/28/2013).  -- Based on the location being a distal vein, it might be reasonable to treat for at least 3 months. If bleeding risk minimal, due to a positive heterozygous for Factor V Mutation, it was reasonable to treat for 6 months which she completed in June 2015.  Her repeat lower extremity was negative for DVT.   It did show some mild inguinal lymphadenopathy. Now continue daily baby aspirin.    2. Heterozygous for Factor V Mutation.   -- We discussed in depth that her heterozygous for Factor V mutation is a lower risk thrombophlia with increase of venous thromboembolism (VTE) of approximately sevenfold increased risk of thrombosis, while homozygosity for the mutation increases thrombosis risk 80-fold. Most clinicians do NOT indefinitely anticoagulate patients who have a first episode of provoked VTE and "lower risk" thrombophilias (eg, factor V Leyden deficiency).  --We then discussed that testing for factor V leiden is recommended for all individuals with venous thrombosis or history of a thrombic event. I think that her son and daughter should be tested to r/o FVL carrier status as it has implications. --We discussed life style modifications esp in lieu of traveling and avoiding sitting for prolonged periods.  3. Bilateral lymphadenopathy, inguinal. --She denies any symptoms.  She does not have palpable axillary, or cervical lymph nodes.   She denies fevers, chills or night sweats or weight changes.   4. Anemia, macrocytic. --Her anemia has improved and she is asymptomatic. MCV is 101.4.  Recommend checking b12, folate, TSH which can ne done by PCP.    5. Follow-up.  -- RTC in hematology clinic only prn or if there are questions. I gave her my email address as well.     All questions were answered. The patient knows to call the clinic with any problems, questions or concerns. We can certainly see the patient much sooner if necessary.  I spent 20 minutes counseling the patient face to face. The total time spent in the appointment was 25 minutes.    Bernadene Bell, MD Medical Hematologist/Oncologist [place] Pager: [phone] Office No: [phone]

## 2014-05-03 ENCOUNTER — Encounter (HOSPITAL_COMMUNITY): Payer: Self-pay | Admitting: Emergency Medicine

## 2014-05-03 DIAGNOSIS — R52 Pain, unspecified: Secondary | ICD-10-CM | POA: Insufficient documentation

## 2014-05-03 DIAGNOSIS — M899 Disorder of bone, unspecified: Secondary | ICD-10-CM | POA: Insufficient documentation

## 2014-05-03 DIAGNOSIS — Z87891 Personal history of nicotine dependence: Secondary | ICD-10-CM | POA: Insufficient documentation

## 2014-05-03 DIAGNOSIS — R3129 Other microscopic hematuria: Secondary | ICD-10-CM | POA: Diagnosis not present

## 2014-05-03 DIAGNOSIS — R0602 Shortness of breath: Secondary | ICD-10-CM | POA: Diagnosis not present

## 2014-05-03 DIAGNOSIS — I6529 Occlusion and stenosis of unspecified carotid artery: Secondary | ICD-10-CM | POA: Diagnosis not present

## 2014-05-03 DIAGNOSIS — R079 Chest pain, unspecified: Secondary | ICD-10-CM | POA: Diagnosis not present

## 2014-05-03 DIAGNOSIS — I10 Essential (primary) hypertension: Secondary | ICD-10-CM | POA: Diagnosis not present

## 2014-05-03 DIAGNOSIS — R0789 Other chest pain: Secondary | ICD-10-CM | POA: Diagnosis not present

## 2014-05-03 DIAGNOSIS — Z79899 Other long term (current) drug therapy: Secondary | ICD-10-CM | POA: Insufficient documentation

## 2014-05-03 DIAGNOSIS — E7800 Pure hypercholesterolemia, unspecified: Secondary | ICD-10-CM | POA: Insufficient documentation

## 2014-05-03 DIAGNOSIS — M949 Disorder of cartilage, unspecified: Secondary | ICD-10-CM | POA: Diagnosis not present

## 2014-05-03 DIAGNOSIS — Z7982 Long term (current) use of aspirin: Secondary | ICD-10-CM | POA: Insufficient documentation

## 2014-05-03 DIAGNOSIS — Z86718 Personal history of other venous thrombosis and embolism: Secondary | ICD-10-CM | POA: Diagnosis not present

## 2014-05-03 NOTE — ED Notes (Signed)
Pt. reports pain across her chest this evening radiating to upper back and both ears , denies SOB /nausea or diaphoresis , no cough or congestion . Pt. took 6 baby ASA prior to arrival . Denies chest pain at triage .

## 2014-05-04 ENCOUNTER — Observation Stay (HOSPITAL_COMMUNITY)
Admission: EM | Admit: 2014-05-04 | Discharge: 2014-05-05 | Disposition: A | Discharge status: Home / Self Care | Payer: Medicare Other | Attending: Internal Medicine | Admitting: Internal Medicine

## 2014-05-04 ENCOUNTER — Emergency Department (HOSPITAL_COMMUNITY): Payer: Medicare Other

## 2014-05-04 ENCOUNTER — Encounter (HOSPITAL_COMMUNITY): Payer: Self-pay

## 2014-05-04 DIAGNOSIS — D6859 Other primary thrombophilia: Secondary | ICD-10-CM | POA: Diagnosis not present

## 2014-05-04 DIAGNOSIS — I10 Essential (primary) hypertension: Secondary | ICD-10-CM | POA: Diagnosis present

## 2014-05-04 DIAGNOSIS — R079 Chest pain, unspecified: Secondary | ICD-10-CM | POA: Diagnosis not present

## 2014-05-04 DIAGNOSIS — R0602 Shortness of breath: Secondary | ICD-10-CM | POA: Diagnosis not present

## 2014-05-04 DIAGNOSIS — R072 Precordial pain: Secondary | ICD-10-CM | POA: Diagnosis not present

## 2014-05-04 DIAGNOSIS — D6851 Activated protein C resistance: Secondary | ICD-10-CM

## 2014-05-04 DIAGNOSIS — E042 Nontoxic multinodular goiter: Secondary | ICD-10-CM | POA: Diagnosis not present

## 2014-05-04 DIAGNOSIS — R0600 Dyspnea, unspecified: Secondary | ICD-10-CM

## 2014-05-04 DIAGNOSIS — E7800 Pure hypercholesterolemia, unspecified: Secondary | ICD-10-CM

## 2014-05-04 HISTORY — DX: Nontoxic multinodular goiter: E04.2

## 2014-05-04 HISTORY — PX: TRANSTHORACIC ECHOCARDIOGRAM: SHX275

## 2014-05-04 LAB — BASIC METABOLIC PANEL
Anion gap: 13 (ref 5–15)
BUN: 24 mg/dL — ABNORMAL HIGH (ref 6–23)
CO2: 27 meq/L (ref 19–32)
CREATININE: 0.74 mg/dL (ref 0.50–1.10)
Calcium: 9.2 mg/dL (ref 8.4–10.5)
Chloride: 103 mEq/L (ref 96–112)
GFR calc Af Amer: >90 mL/min (ref >90)
GFR calc non Af Amer: 85 mL/min — ABNORMAL LOW (ref >90)
GLUCOSE: 103 mg/dL — AB (ref 70–99)
Potassium: 3.6 mEq/L — ABNORMAL LOW (ref 3.7–5.3)
Sodium: 143 mEq/L (ref 137–147)

## 2014-05-04 LAB — CBC
HEMATOCRIT: 35.8 % — AB (ref 36.0–46.0)
HEMOGLOBIN: 12.2 g/dL (ref 12.0–15.0)
MCH: 33.5 pg (ref 26.0–34.0)
MCHC: 34.1 g/dL (ref 30.0–36.0)
MCV: 98.4 fL (ref 78.0–100.0)
Platelets: 186 10*3/uL (ref 150–400)
RBC: 3.64 MIL/uL — AB (ref 3.87–5.11)
RDW: 12.9 % (ref 11.5–15.5)
WBC: 7.4 10*3/uL (ref 4.0–10.5)

## 2014-05-04 LAB — TROPONIN I: Troponin I: <0.3 ng/mL (ref <0.30)

## 2014-05-04 LAB — I-STAT TROPONIN, ED: Troponin i, poc: 0 ng/mL (ref 0.00–0.08)

## 2014-05-04 MED ORDER — LISINOPRIL 20 MG PO TABS
20.0000 mg | ORAL_TABLET | Freq: Every day | ORAL | Status: DC
  Administered 2014-05-04: 20 mg via ORAL
  Filled 2014-05-04 (×2): qty 1

## 2014-05-04 MED ORDER — CALCIUM CARBONATE-VITAMIN D 500-200 MG-UNIT PO TABS
1.0000 | ORAL_TABLET | Freq: Every day | ORAL | Status: DC
  Administered 2014-05-05: 1 via ORAL
  Filled 2014-05-04 (×2): qty 1

## 2014-05-04 MED ORDER — ACETAMINOPHEN 650 MG RE SUPP: 650.0000 mg | Freq: Four times a day (QID) | RECTAL | Status: DC | PRN

## 2014-05-04 MED ORDER — CALCIUM CARBONATE-VITAMIN D 600-400 MG-UNIT PO TABS: 1.0000 | ORAL_TABLET | Freq: Every day | ORAL | Status: DC

## 2014-05-04 MED ORDER — SODIUM CHLORIDE 0.9 % IV SOLN: 250.0000 mL | INTRAVENOUS | Status: DC | PRN

## 2014-05-04 MED ORDER — ONDANSETRON HCL 4 MG/2ML IJ SOLN: 4.0000 mg | Freq: Three times a day (TID) | INTRAMUSCULAR | Status: DC | PRN

## 2014-05-04 MED ORDER — IOHEXOL 350 MG/ML SOLN
100.0000 mL | Freq: Once | INTRAVENOUS | Status: AC | PRN
  Administered 2014-05-04: 100 mL via INTRAVENOUS

## 2014-05-04 MED ORDER — SODIUM CHLORIDE 0.9 % IV BOLUS (SEPSIS)
500.0000 mL | Freq: Once | INTRAVENOUS | Status: AC
  Administered 2014-05-04: 500 mL via INTRAVENOUS

## 2014-05-04 MED ORDER — ONDANSETRON HCL 4 MG/2ML IJ SOLN: 4.0000 mg | Freq: Four times a day (QID) | INTRAMUSCULAR | Status: DC | PRN

## 2014-05-04 MED ORDER — ASPIRIN 325 MG PO TABS
325.0000 mg | ORAL_TABLET | Freq: Every day | ORAL | Status: DC
  Administered 2014-05-04 – 2014-05-05 (×2): 325 mg via ORAL
  Filled 2014-05-04 (×2): qty 1

## 2014-05-04 MED ORDER — ACETAMINOPHEN 325 MG PO TABS: 650.0000 mg | ORAL_TABLET | Freq: Four times a day (QID) | ORAL | Status: DC | PRN

## 2014-05-04 MED ORDER — NITROGLYCERIN 2 % TD OINT
0.5000 [in_us] | TOPICAL_OINTMENT | Freq: Four times a day (QID) | TRANSDERMAL | Status: DC
  Administered 2014-05-04 – 2014-05-05 (×4): 0.5 [in_us] via TOPICAL
  Filled 2014-05-04 (×3): qty 1
  Filled 2014-05-04: qty 30

## 2014-05-04 MED ORDER — SODIUM CHLORIDE 0.9 % IJ SOLN
3.0000 mL | Freq: Two times a day (BID) | INTRAMUSCULAR | Status: DC
  Administered 2014-05-04: 3 mL via INTRAVENOUS

## 2014-05-04 MED ORDER — HYDROCHLOROTHIAZIDE 12.5 MG PO CAPS
12.5000 mg | ORAL_CAPSULE | Freq: Every day | ORAL | Status: DC
Start: 2014-05-04 — End: 2014-05-05
  Administered 2014-05-04: 12.5 mg via ORAL
  Filled 2014-05-04 (×2): qty 1

## 2014-05-04 MED ORDER — HYDROMORPHONE HCL 1 MG/ML IJ SOLN: 0.5000 mg | INTRAMUSCULAR | Status: DC | PRN

## 2014-05-04 MED ORDER — SODIUM CHLORIDE 0.9 % IJ SOLN: 3.0000 mL | Freq: Two times a day (BID) | INTRAMUSCULAR | Status: DC

## 2014-05-04 MED ORDER — SIMVASTATIN 40 MG PO TABS
40.0000 mg | ORAL_TABLET | Freq: Every day | ORAL | Status: DC
  Administered 2014-05-04: 40 mg via ORAL
  Filled 2014-05-04 (×3): qty 1

## 2014-05-04 MED ORDER — OXYCODONE HCL 5 MG PO TABS: 5.0000 mg | ORAL_TABLET | ORAL | Status: DC | PRN

## 2014-05-04 MED ORDER — ENOXAPARIN SODIUM 40 MG/0.4ML ~~LOC~~ SOLN
40.0000 mg | SUBCUTANEOUS | Status: DC
  Filled 2014-05-04 (×2): qty 0.4

## 2014-05-04 MED ORDER — LISINOPRIL-HYDROCHLOROTHIAZIDE 20-12.5 MG PO TABS: 1.0000 | ORAL_TABLET | Freq: Every day | ORAL | Status: DC

## 2014-05-04 MED ORDER — SODIUM CHLORIDE 0.9 % IV SOLN
INTRAVENOUS | Status: DC
  Administered 2014-05-04 – 2014-05-05 (×2): via INTRAVENOUS

## 2014-05-04 MED ORDER — ONDANSETRON HCL 4 MG PO TABS: 4.0000 mg | ORAL_TABLET | Freq: Four times a day (QID) | ORAL | Status: DC | PRN

## 2014-05-04 MED ORDER — OXYBUTYNIN CHLORIDE ER 10 MG PO TB24
10.0000 mg | ORAL_TABLET | Freq: Every day | ORAL | Status: DC
  Administered 2014-05-04 – 2014-05-05 (×2): 10 mg via ORAL
  Filled 2014-05-04 (×2): qty 1

## 2014-05-04 MED ORDER — ALUM & MAG HYDROXIDE-SIMETH 200-200-20 MG/5ML PO SUSP: 30.0000 mL | Freq: Four times a day (QID) | ORAL | Status: DC | PRN

## 2014-05-04 MED ORDER — SODIUM CHLORIDE 0.9 % IJ SOLN
3.0000 mL | INTRAMUSCULAR | Status: DC | PRN
  Administered 2014-05-04: 3 mL via INTRAVENOUS

## 2014-05-04 NOTE — Progress Notes (Signed)
UR completed 

## 2014-05-04 NOTE — Consult Note (Signed)
CARDIOLOGY CONSULT NOTE   Patient ID: Dorothy Rojas MRN: [id number] DOB/AGE: [birth year] 68 y.o.  Admit date: 05/04/2014  Primary Physician   [name], MD Primary Cardiologist   New Reason for Consultation   Chest pain  KWI:OXBDZHGD A Lyne is a 68 y.o. female with no history of CAD.  She has a history of heterzygous factor V Leiden disease, multinodular goiter, HTN, HLD and had a heart murmur as a child. She was admitted with chest pain and cardiology is asked to evaluate her.  About 6-8 weeks ago, she began waking in the middle of the night with severe midscapular back pain, 10/10, occasional radiation up into her jaw, No associated symptoms. It would last up to 10 minutes. She could be sleeping on either side or on her stomach (does not sleep on her back). She was getting these symptoms 1-2 symptoms. It would resolve without intervention, she would be able to get back to sleep.   Last night, things were different. The pain woke her about 11:15 pm, it was in her chest as well as her back. It went up her jaw and into her ear. She did not try any medications. A friend told her to take 6 baby ASA, which she did, and transported her to the ER. The pain lasted a little longer than usual, about 15 minutes, but resolved.   She began having episodes of the upper back pain and DOE when she has been walking for exercise or up a hill. The upper back pain started about the same time as the nocturnal pain, but the DOE did not start until about a month ago. Since then, the pain and SOB have been consistent with exertion.    Past Medical History  Diagnosis Date  . Hypertension   . Elevated cholesterol   . Osteopenia     last DEXA 10/2009  . Former smoker     Quit in 1984  . Mixed stress and urge urinary incontinence     Dr. McDiarmid  . Torn meniscus     L knee  . Heterozygous factor V Leiden mutation 07/2013  . DVT (deep venous thrombosis) 07/2013    a. L peroneal vein 07/2013,  completed 6 months of Xarelto.  . Microscopic hematuria 01/17/2014    -chronic colonization  . Multinodular thyroid     a. Path benign 2013.     Past Surgical History  Procedure Laterality Date  . Tonsillectomy    . Total abdominal hysterectomy w/ bilateral salpingoophorectomy  04/1999  . Appendectomy  1973  . Ovarian cyst removal  1973    LEFT OVARIAN  . Incontinence surgery  04/1999    Re-do at [facility] for sling complications in 9242  . Neuroma surgery  1980's    bilateral feet  . Lateral epicondyle release      Right  . Cystocele repair  11/2010    Dr. McDiarmid  . Knee arthroscopy Right 04/05/13    Dr. Onnie Graham  . Cystoscopy  01/17/2014    normal    Allergies  Allergen Reactions  . Naprosyn [Naproxen] Rash    I have reviewed the patient's current medications . aspirin  325 mg Oral Daily  . [START ON 05/05/2014] calcium-vitamin D  1 tablet Oral Q breakfast  . enoxaparin (LOVENOX) injection  40 mg Subcutaneous Q24H  . lisinopril  20 mg Oral Daily   And  . hydrochlorothiazide  12.5 mg Oral Daily  . nitroGLYCERIN  0.5  inch Topical 4 times per day  . oxybutynin  10 mg Oral Daily  . simvastatin  40 mg Oral q1800  . sodium chloride  3 mL Intravenous Q12H  . sodium chloride  3 mL Intravenous Q12H     sodium chloride, acetaminophen, acetaminophen, alum & mag hydroxide-simeth, HYDROmorphone (DILAUDID) injection, ondansetron (ZOFRAN) IV, ondansetron, oxyCODONE, sodium chloride  Prior to Admission medications   Medication Sig Start Date End Date Taking? Authorizing Provider  aspirin 81 MG tablet Take 81 mg by mouth daily.   Yes Historical Provider, MD  Calcium Carbonate-Vitamin D (CALCIUM 600+D) 600-400 MG-UNIT per tablet Take 1 tablet by mouth daily.   Yes Historical Provider, MD  lisinopril-hydrochlorothiazide (PRINZIDE,ZESTORETIC) 20-12.5 MG per tablet TAKE 1 TABLET ONCE DAILY. 04/20/14  Yes [name], MD  oxybutynin (DITROPAN-XL) 10 MG 24 hr tablet Take 10 mg by mouth daily.     Yes Historical Provider, MD  simvastatin (ZOCOR) 40 MG tablet TAKE ONE TABLET AT BEDTIME. 04/20/14  Yes [name], MD     History   Social History  . Marital Status: Married    Spouse Name: N/A    Number of Children: 3  . Years of Education: N/A   Occupational History  .     Social History Main Topics  . Smoking status: Former Smoker    Quit date: 08/11/1985  . Smokeless tobacco: Never Used  . Alcohol Use: Yes     Comment: 1 glass of wine on weekends only (2-3/week)  . Drug Use: No  . Sexual Activity: Not Currently   Other Topics Concern  . Not on file   Social History Narrative   Married.  Lives with husband.  Children live in [place], [place].  Youngest child was killed in [place].  3 stepchildren are all local. 1 dog (Muffin lost vision 12/01/13 due to retinal detachment)    Family Status  Relation Status Death Age  . Father Deceased 46    MI  . Mother Deceased 69  . Sister Deceased 11  . Brother Alive   . Daughter Alive   . Son Alive   . Son Deceased     MVA 12-01-1996   Family History  Problem Relation Age of Onset  . Hypertension Father   . Heart disease Father 52    CABG, died from MI at 24  . COPD Father   . Cirrhosis Mother     related to psoriasis medications  . Diabetes Mother   . Osteogenesis imperfecta Sister   . Heart disease Sister     CHF  . Diabetes Brother   . Heart disease Brother 22    MI  . Deep vein thrombosis Brother     factor V Leiden NEGATIVE  . Cancer Neg Hx      ROS:  Full 14 point review of systems complete and found to be negative unless listed above.  Physical Exam: Blood pressure 126/54, pulse 66, temperature 98.2 F (36.8 C), temperature source Oral, resp. rate 18, height 5\' 4"  (1.626 m), weight 130 lb (58.968 kg), SpO2 97.00%.  General: Well developed, well nourished, female in no acute distress Head: Eyes PERRLA, No xanthomas.   Normocephalic and atraumatic, oropharynx without edema or exudate. Dentition: good Lungs: essentially clear  bilaterally Heart: HRRR S1 S2, no rub/gallop, soft systolic murmur. pulses are 2+ extrem.   Neck: No carotid bruits. No lymphadenopathy.  JVD not elevated. Abdomen: Bowel sounds present, abdomen soft and non-tender without masses or hernias noted. Msk:  No spine or cva tenderness. No weakness, no joint deformities or effusions. Extremities: No clubbing or cyanosis.  edema.  Neuro: Alert and oriented X 3. No focal deficits noted. Psych:  Good affect, responds appropriately Skin: No rashes or lesions noted.  Labs:   Lab Results  Component Value Date   WBC 7.4 05/03/2014   HGB 12.2 05/03/2014   HCT 35.8* 05/03/2014   MCV 98.4 05/03/2014   PLT 186 05/03/2014     Recent Labs Lab 05/03/14 2352  NA 143  K 3.6*  CL 103  CO2 27  BUN 24*  CREATININE 0.74  CALCIUM 9.2  GLUCOSE 103*    Recent Labs  05/04/14 0258 05/04/14 0634 05/04/14 1239  TROPONINI <0.30 <0.30 <0.30    Recent Labs  05/04/14 0006  TROPIPOC 0.00   Lab Results  Component Value Date   CHOL 127 04/18/2014   HDL 44 04/18/2014   LDLCALC 49 04/18/2014   TRIG 172* 04/18/2014   Lab Results  Component Value Date   TSH 2.622 04/12/2013   Echo: 05/04/2014 Study Conclusions - Left ventricle: The cavity size was normal. Systolic function was normal. The estimated ejection fraction was in the range of 60% to 65%. Images were inadequate for LV wall motion assessment.  ECG:  05/04/2014 SR, no acute ischemic changes Vent. rate 67 BPM PR interval 163 ms QRS duration 89 ms QT/QTc 401/423 ms P-R-T axes 74 57  Radiology:  Dg Chest 2 View 05/04/2014   CLINICAL DATA:  Chest pain.  EXAM: CHEST  2 VIEW  COMPARISON:  11/21/2010  FINDINGS: Normal heart size and mediastinal contours. No acute infiltrate or edema. No effusion or pneumothorax. No acute osseous findings.  IMPRESSION: No active cardiopulmonary disease.   Electronically Signed   By: Jorje Guild M.D.   On: 05/04/2014 00:47   Ct Angio Chest W/cm &/or Wo Cm 05/04/2014    CLINICAL DATA:  Chest pain. Factor 5 Leiden. Shortness of breath. DVT history.  EXAM: CT ANGIOGRAPHY CHEST WITH CONTRAST  TECHNIQUE: Multidetector CT imaging of the chest was performed using the standard protocol during bolus administration of intravenous contrast. Multiplanar CT image reconstructions and MIPs were obtained to evaluate the vascular anatomy.  CONTRAST:  166mL OMNIPAQUE IOHEXOL 350 MG/ML SOLN  COMPARISON:  None.  FINDINGS: THORACIC INLET/BODY WALL:  1 cm nodule within the right thyroid gland, likely incidental based on size. No neighboring adenopathy.  MEDIASTINUM:  Normal heart size. No pericardial effusion. No acute vascular abnormality, including pulmonary embolism or aortic dissection. No adenopathy.  LUNG WINDOWS:  No consolidation.  No effusion.  No suspicious pulmonary nodule.  UPPER ABDOMEN:  No acute findings.  OSSEOUS:  No acute fracture.  No suspicious lytic or blastic lesions.  Review of the MIP images confirms the above findings.  IMPRESSION: Negative for pulmonary embolism.   Electronically Signed   By: Jorje Guild M.D.   On: 05/04/2014 04:51    ASSESSMENT AND PLAN:   The patient was seen today by Dr. Sallyanne Kuster, the patient evaluated and the data reviewed.   Principal Problem:   Chest pain - typical and atypical features including FH of CAD, but no Ca++ on chest CT, decreasing likelihood of coronary plaque. If ez remain negative and pt remains pain-free, consider cardiac CT for eval. Will order for am. RN aware to get 18 ga IV in [facility]. NPO after midnight.   Active Problems:   Essential hypertension, benign - per IM, SBP appears well-controlled    Pure  hypercholesterolemia - pt says triglycerides run high, is compliant w/ rx and diet. Per IM    Multinodular goiter - per IM, TSH was normal last year    Heterozygous factor V Leiden mutation - per pt, hematologist told her no need to chronically anticoagulate since she is heterozygous. Per IM.   SignedRosaria Ferries,  PA-C 05/04/2014 4:53 PM [facility] [phone]   I have seen and examined the patient along with Rosaria Ferries, PA-C.  I have reviewed the chart, notes and new data.  I agree with PA's note.  Key new complaints: She describes quite typical exertional angina pectoris but has had similar pain unprovoked at night. Key examination changes: No clinical signs of heart failure, no arrhythmia on monitor Key new findings / data: Low risk ECG and biochemical markers The CT of her chest performed for pulmonary embolism does not show any evidence of significant coronary calcification. A few scattered atherosclerotic calcific plaques are seen in the thoracic and abdominal aorta only.  PLAN: I think she is well suited for a coronary CT angiogram since she seems to have no significant coronary calcification, has a fairly slow resting heart rate and low risk biochemical and ECG findings. Of course, this will mean administering intravenous contrast on 2 consecutive days. We'll make sure that she is hydrated overnight.  Sanda Klein, MD, [place] [phone] 05/04/2014, 6:12 PM

## 2014-05-04 NOTE — H&P (Signed)
[facility] Admission History and Physical       Dorothy Rojas HEN:[id number] DOB: [birth year] DOA: 05/04/2014  Referring physician:  EDP PCP: [name], MD  Specialists:   Chief Complaint:  Chest Pain  HPI: Dorothy Rojas is a 68 y.o. female with a history of Factor V Leiden Dz, HTN, Multinodular Goiter and Hyperlipidemia who presents to the ED with complaints of chest Pain across her chest and radiating between her shoulder blades and into her back.   She rates the pain at a 10/10 and the pain was burning in quality and lasted for 15 to 20 minutes.  She denies having any SOB, nausea or vomiting or diaphoresis associated with her pain.   She reports having 2 similar episodes this past week that were not as intense.    She was evaluated in the ED and had an initial workup which was negative.  She was also sent for a CTA of the Chest which was negative for a Pulmonary Embolism and also negative for an Aortic Dissection.  She was referred for medical admission.     Review of Systems:  Constitutional: No Weight Loss, No Weight Gain, Night Sweats, Fevers, Chills, Dizziness, Fatigue, or Generalized Weakness HEENT: No Headaches, Difficulty Swallowing,Tooth/Dental Problems,Sore Throat,  No Sneezing, Rhinitis, Ear Ache, Nasal Congestion, or Post Nasal Drip,  Cardio-vascular:  +Chest pain, Orthopnea, PND, Edema in Lower Extremities, Anasarca, Dizziness, Palpitations  Resp: No Dyspnea, No DOE, No Productive Cough, No Non-Productive Cough, No Hemoptysis, No Wheezing.    GI: No Heartburn, Indigestion, Abdominal Pain, Nausea, Vomiting, Diarrhea, Hematemesis, Hematochezia, Melena, Change in Bowel Habits,  Loss of Appetite  GU: No Dysuria, Change in Color of Urine, No Urgency or Frequency, No Flank pain.  Musculoskeletal: No Joint Pain or Swelling, No Decreased Range of Motion, No Back Pain.  Neurologic: No Syncope, No Seizures, Muscle Weakness, Paresthesia, Vision Disturbance or Loss, No  Diplopia, No Vertigo, No Difficulty Walking,  Skin: No Rash or Lesions. Psych: No Change in Mood or Affect, No Depression or Anxiety, No Memory loss, No Confusion, or Hallucinations   Past Medical History  Diagnosis Date  . Hypertension   . Elevated cholesterol   . Osteopenia     last DEXA 10/2009  . Former smoker     Quit in 1984  . Mixed stress and urge urinary incontinence     Dr. McDiarmid  . Torn meniscus     L knee  . Heterozygous factor V Leiden mutation 07/2013  . DVT (deep venous thrombosis) 07/2013    L peroneal v  . Microscopic hematuria 01/17/2014    -chronic colonization      Past Surgical History  Procedure Laterality Date  . Tonsillectomy    . Total abdominal hysterectomy w/ bilateral salpingoophorectomy  04/1999  . Appendectomy  1973  . Ovarian cyst removal  1973    LEFT OVARIAN  . Incontinence surgery  04/1999    Re-do at [facility] for sling complications in 3614  . Neuroma surgery  1980's    bilateral feet  . Lateral epicondyle release      Right  . Cystocele repair  11/2010    Dr. McDiarmid  . Knee arthroscopy Right 04/05/13    Dr. Onnie Graham  . Cystoscopy  01/17/2014    normal       Prior to Admission medications   Medication Sig Start Date End Date Taking? Authorizing Provider  aspirin 81 MG tablet Take 81 mg by mouth daily.  Yes Historical Provider, MD  Calcium Carbonate-Vitamin D (CALCIUM 600+D) 600-400 MG-UNIT per tablet Take 1 tablet by mouth daily.   Yes Historical Provider, MD  lisinopril-hydrochlorothiazide (PRINZIDE,ZESTORETIC) 20-12.5 MG per tablet TAKE 1 TABLET ONCE DAILY. 04/20/14  Yes [name], MD  oxybutynin (DITROPAN-XL) 10 MG 24 hr tablet Take 10 mg by mouth daily.    Yes Historical Provider, MD  simvastatin (ZOCOR) 40 MG tablet TAKE ONE TABLET AT BEDTIME. 04/20/14  Yes [name], MD      Allergies  Allergen Reactions  . Naprosyn [Naproxen] Rash     Social History:  reports that she quit smoking about 28 years ago. She has never  used smokeless tobacco. She reports that she drinks alcohol. She reports that she does not use illicit drugs.     Family History  Problem Relation Age of Onset  . Hypertension Father   . Heart disease Father 37    CABG, died from MI at 56  . COPD Father   . Cirrhosis Mother     related to psoriasis medications  . Diabetes Mother   . Osteogenesis imperfecta Sister   . Heart disease Sister     CHF  . Diabetes Brother   . Heart disease Brother 26    MI  . Deep vein thrombosis Brother     factor V Leiden NEGATIVE  . Cancer Neg Hx        Physical Exam:  GEN:  Pleasant  68 y.o. female  examined  and in no acute distress; cooperative with exam Filed Vitals:   05/04/14 0345 05/04/14 0445 05/04/14 0500 05/04/14 0515  BP: 136/69 144/68 138/62 120/77  Pulse: 61 63 63 64  Temp:      TempSrc:      Resp: 17 16 18 17   SpO2: 97% 98% 97% 97%   Blood pressure 120/77, pulse 64, temperature 99 F (37.2 C), temperature source Oral, resp. rate 17, SpO2 97.00%. PSYCH: She is alert and oriented x4; does not appear anxious does not appear depressed; affect is normal HEENT: Normocephalic and Atraumatic, Mucous membranes pink; PERRLA; EOM intact; Fundi:  Benign;  No scleral icterus, Nares: Patent, Oropharynx: Clear,  Fair Dentition,    Neck:  FROM, No Cervical Lymphadenopathy nor Thyromegaly or Carotid Bruit; No JVD; Breasts:: Not examined CHEST WALL: No tenderness CHEST: Normal respiration, clear to auscultation bilaterally HEART: Regular rate and rhythm; no murmurs rubs or gallops BACK: No kyphosis or scoliosis; No CVA tenderness ABDOMEN: Positive Bowel Sounds, Soft Non-Tender; No Masses, No Organomegaly. Rectal Exam: Not done EXTREMITIES: No Cyanosis, Clubbing, or Edema; No Ulcerations. Genitalia: not examined PULSES: 2+ and symmetric SKIN: Normal hydration no rash or ulceration CNS:  Alert and Oriented x 4, NO focal Deficits  Vascular: pulses palpable throughout    Labs on Admission:   Basic Metabolic Panel:  Recent Labs Lab 05/03/14 2352  NA 143  K 3.6*  CL 103  CO2 27  GLUCOSE 103*  BUN 24*  CREATININE 0.74  CALCIUM 9.2   Liver Function Tests: No results found for this basename: AST, ALT, ALKPHOS, BILITOT, PROT, ALBUMIN,  in the last 168 hours No results found for this basename: LIPASE, AMYLASE,  in the last 168 hours No results found for this basename: AMMONIA,  in the last 168 hours CBC:  Recent Labs Lab 04/28/14 0814 05/03/14 2352  WBC 8.7 7.4  NEUTROABS 5.0  --   HGB 11.8 12.2  HCT 36.2 35.8*  MCV 101.4* 98.4  PLT 208  186   Cardiac Enzymes:  Recent Labs Lab 05/04/14 0258  TROPONINI <0.30    BNP (last 3 results) No results found for this basename: PROBNP,  in the last 8760 hours CBG: No results found for this basename: GLUCAP,  in the last 168 hours  Radiological Exams on Admission: Dg Chest 2 View  05/04/2014   CLINICAL DATA:  Chest pain.  EXAM: CHEST  2 VIEW  COMPARISON:  11/21/2010  FINDINGS: Normal heart size and mediastinal contours. No acute infiltrate or edema. No effusion or pneumothorax. No acute osseous findings.  IMPRESSION: No active cardiopulmonary disease.   Electronically Signed   By: Jorje Guild M.D.   On: 05/04/2014 00:47   Ct Angio Chest W/cm &/or Wo Cm  05/04/2014   CLINICAL DATA:  Chest pain. Factor 5 Leiden. Shortness of breath. DVT history.  EXAM: CT ANGIOGRAPHY CHEST WITH CONTRAST  TECHNIQUE: Multidetector CT imaging of the chest was performed using the standard protocol during bolus administration of intravenous contrast. Multiplanar CT image reconstructions and MIPs were obtained to evaluate the vascular anatomy.  CONTRAST:  160mL OMNIPAQUE IOHEXOL 350 MG/ML SOLN  COMPARISON:  None.  FINDINGS: THORACIC INLET/BODY WALL:  1 cm nodule within the right thyroid gland, likely incidental based on size. No neighboring adenopathy.  MEDIASTINUM:  Normal heart size. No pericardial effusion. No acute vascular abnormality,  including pulmonary embolism or aortic dissection. No adenopathy.  LUNG WINDOWS:  No consolidation.  No effusion.  No suspicious pulmonary nodule.  UPPER ABDOMEN:  No acute findings.  OSSEOUS:  No acute fracture.  No suspicious lytic or blastic lesions.  Review of the MIP images confirms the above findings.  IMPRESSION: Negative for pulmonary embolism.   Electronically Signed   By: Jorje Guild M.D.   On: 05/04/2014 04:51     EKG: Independently reviewed. Normal sinus Rhythm rate = 67 No Acute S-T Changes   Assessment/Plan:   68 y.o. female with   Principal Problem:   1.   Chest pain   Telemetry Monitoring   Cycle Troponins   Nitropaste, O2, ASA   2D ECHO in AM   Check Fasting Lipids   CTA Chest : Negative for PE or Dissection    2.   Essential hypertension, benign   Continue Lisinopril/HCTZ   Monitor BPs    3.   Pure hypercholesterolemia   Continue Simvastatin Rx   Check Fasting Lipids    4.   Multinodular goiter   Check TSH level    5.   Heterozygous factor V Leiden mutation   Currently on ASA rx    6.   DVT prophylaxis    Lovenox      Code Status:   FULL CODE Family Communication:    No Family Present Disposition Plan:    Inpatient  Time spent:  Lyerly C [facility] Pager [phone]   If [place] Please Contact the Day Rounding Team MD for [facility]  If 7PM-7AM, Please Contact night-coverage  [email or web address] Password [facility] 05/04/2014, 6:11 AM

## 2014-05-04 NOTE — ED Provider Notes (Signed)
CSN: [id number]     Arrival date & time 05/03/14  2346 History   First MD Initiated Contact with Patient 05/04/14 0046     Chief Complaint  Patient presents with  . Chest Pain     (Consider location/radiation/quality/duration/timing/severity/associated sxs/prior Treatment) HPI Comments: 68 year old female with history of high blood pressure, high cholesterol, carotid stenosis, factor V Leiden, DVT, recently taken off rivaroxaban presents with exertional shortness of breath and intermittent chest discomfort the past few days. Patient's had 2 or 3 episodes today with mild radiation to the back, bilateral anterior chest initially. Patient currently asymptomatic on exam. No history of similar. No cardiac history known. Patient had aspirin prior to arrival, symptoms improved with time and rest. Take  Patient is a 68 y.o. female presenting with chest pain. The history is provided by the patient.  Chest Pain Associated symptoms: shortness of breath   Associated symptoms: no abdominal pain, no back pain, no fever, no headache and not vomiting     Past Medical History  Diagnosis Date  . Hypertension   . Elevated cholesterol   . Osteopenia     last DEXA 10/2009  . Former smoker     Quit in 1984  . Mixed stress and urge urinary incontinence     Dr. McDiarmid  . Torn meniscus     L knee  . Heterozygous factor V Leiden mutation 07/2013  . DVT (deep venous thrombosis) 07/2013    L peroneal v  . Microscopic hematuria 01/17/2014    -chronic colonization   Past Surgical History  Procedure Laterality Date  . Tonsillectomy    . Total abdominal hysterectomy w/ bilateral salpingoophorectomy  04/1999  . Appendectomy  1973  . Ovarian cyst removal  1973    LEFT OVARIAN  . Incontinence surgery  04/1999    Re-do at [facility] for sling complications in 2202  . Neuroma surgery  1980's    bilateral feet  . Lateral epicondyle release      Right  . Cystocele repair  11/2010    Dr. McDiarmid  . Knee  arthroscopy Right 04/05/13    Dr. Onnie Graham  . Cystoscopy  01/17/2014    normal   Family History  Problem Relation Age of Onset  . Hypertension Father   . Heart disease Father 76    CABG, died from MI at 73  . COPD Father   . Cirrhosis Mother     related to psoriasis medications  . Diabetes Mother   . Osteogenesis imperfecta Sister   . Heart disease Sister     CHF  . Diabetes Brother   . Heart disease Brother 25    MI  . Deep vein thrombosis Brother     factor V Leiden NEGATIVE  . Cancer Neg Hx    History  Substance Use Topics  . Smoking status: Former Smoker    Quit date: 08/11/1985  . Smokeless tobacco: Never Used  . Alcohol Use: Yes     Comment: 1 glass of wine on weekends only (2-3/week)   OB History   Grav Para Term Preterm Abortions TAB SAB Ect Mult Living   3 3        2      Review of Systems  Constitutional: Negative for fever and chills.  HENT: Negative for congestion.   Eyes: Negative for visual disturbance.  Respiratory: Positive for shortness of breath.   Cardiovascular: Positive for chest pain. Negative for leg swelling.  Gastrointestinal: Negative for vomiting and abdominal  pain.  Genitourinary: Negative for dysuria and flank pain.  Musculoskeletal: Negative for back pain, neck pain and neck stiffness.  Skin: Negative for rash.  Neurological: Negative for light-headedness and headaches.      Allergies  Naprosyn  Home Medications   Prior to Admission medications   Medication Sig Start Date End Date Taking? Authorizing Provider  aspirin 81 MG tablet Take 81 mg by mouth daily.   Yes Historical Provider, MD  Calcium Carbonate-Vitamin D (CALCIUM 600+D) 600-400 MG-UNIT per tablet Take 1 tablet by mouth daily.   Yes Historical Provider, MD  lisinopril-hydrochlorothiazide (PRINZIDE,ZESTORETIC) 20-12.5 MG per tablet TAKE 1 TABLET ONCE DAILY. 04/20/14  Yes [name], MD  oxybutynin (DITROPAN-XL) 10 MG 24 hr tablet Take 10 mg by mouth daily.    Yes  Historical Provider, MD  simvastatin (ZOCOR) 40 MG tablet TAKE ONE TABLET AT BEDTIME. 04/20/14  Yes [name], MD   BP 149/76  Pulse 67  Resp 18  SpO2 99% Physical Exam  Nursing note and vitals reviewed. Constitutional: She is oriented to person, place, and time. She appears well-developed and well-nourished.  HENT:  Head: Normocephalic and atraumatic.  Eyes: Conjunctivae are normal. Right eye exhibits no discharge. Left eye exhibits no discharge.  Neck: Normal range of motion. Neck supple. No tracheal deviation present.  Cardiovascular: Normal rate, regular rhythm and intact distal pulses.   Pulmonary/Chest: Effort normal and breath sounds normal.  Abdominal: Soft. She exhibits no distension. There is no tenderness. There is no guarding.  Musculoskeletal: She exhibits no edema and no tenderness.  Neurological: She is alert and oriented to person, place, and time.  Skin: Skin is warm. No rash noted.  Psychiatric: She has a normal mood and affect.    ED Course  Procedures (including critical care time) Labs Review Labs Reviewed  CBC - Abnormal; Notable for the following:    RBC 3.64 (*)    HCT 35.8 (*)    All other components within normal limits  BASIC METABOLIC PANEL - Abnormal; Notable for the following:    Potassium 3.6 (*)    Glucose, Bld 103 (*)    BUN 24 (*)    GFR calc non Af Amer 85 (*)    All other components within normal limits  TROPONIN I  Randolm Idol, ED    Imaging Review Dg Chest 2 View  05/04/2014   CLINICAL DATA:  Chest pain.  EXAM: CHEST  2 VIEW  COMPARISON:  11/21/2010  FINDINGS: Normal heart size and mediastinal contours. No acute infiltrate or edema. No effusion or pneumothorax. No acute osseous findings.  IMPRESSION: No active cardiopulmonary disease.   Electronically Signed   By: Jorje Guild M.D.   On: 05/04/2014 00:47   Ct Angio Chest W/cm &/or Wo Cm  05/04/2014   CLINICAL DATA:  Chest pain. Factor 5 Leiden. Shortness of breath. DVT  history.  EXAM: CT ANGIOGRAPHY CHEST WITH CONTRAST  TECHNIQUE: Multidetector CT imaging of the chest was performed using the standard protocol during bolus administration of intravenous contrast. Multiplanar CT image reconstructions and MIPs were obtained to evaluate the vascular anatomy.  CONTRAST:  167mL OMNIPAQUE IOHEXOL 350 MG/ML SOLN  COMPARISON:  None.  FINDINGS: THORACIC INLET/BODY WALL:  1 cm nodule within the right thyroid gland, likely incidental based on size. No neighboring adenopathy.  MEDIASTINUM:  Normal heart size. No pericardial effusion. No acute vascular abnormality, including pulmonary embolism or aortic dissection. No adenopathy.  LUNG WINDOWS:  No consolidation.  No effusion.  No suspicious pulmonary nodule.  UPPER ABDOMEN:  No acute findings.  OSSEOUS:  No acute fracture.  No suspicious lytic or blastic lesions.  Review of the MIP images confirms the above findings.  IMPRESSION: Negative for pulmonary embolism.   Electronically Signed   By: Jorje Guild M.D.   On: 05/04/2014 04:51     EKG Interpretation   Date/Time:  Wednesday May 03 2014 23:50:18 EDT Ventricular Rate:  65 PR Interval:  152 QRS Duration: 82 QT Interval:  408 QTC Calculation: 424 R Axis:   58 Text Interpretation:  Normal sinus rhythm Normal ECG Confirmed by Deontre Allsup   MD, Colson Barco ([id number]) on 05/04/2014 12:21:10 AM      Repeat EKG for brief chest pain the ER. Heart rate 67, normal axis, normal QT, no acute EKG changes, no ST elevation, similar to previous. MDM   Final diagnoses:  Acute chest pain  Dyspnea  Patient with factor V Leiden history and 3 cardiac risk factors presents with exertional shortness breath and intermittent chest discomfort. Discussed differential including pulmonary embolism versus coronary artery disease. Plan for screening troponin, EKG reviewed unremarkable. CT angina the chest pending and plan for telemetry observation. Primary Dr. Lenard Simmer medical  Patient chest pain-free  on recheck. Discussed telemetry observation with Dr. Arnoldo Morale . CT chest no acute pulmonary embolism. The patients results and plan were reviewed and discussed.   Any x-rays performed were personally reviewed by myself.   Differential diagnosis were considered with the presenting HPI.  Medications  ondansetron (ZOFRAN) injection 4 mg (not administered)  sodium chloride 0.9 % bolus 500 mL (0 mLs Intravenous Stopped 05/04/14 0345)  iohexol (OMNIPAQUE) 350 MG/ML injection 100 mL (100 mLs Intravenous Contrast Given 05/04/14 0405)    Filed Vitals:   05/04/14 0153  BP: 149/76  Pulse: 67  Resp: 18  SpO2: 99%    Final diagnoses:  Acute chest pain  Dyspnea  Dyspnea  Admission/ observation were discussed with the admitting physician, patient and/or family and they are comfortable with the plan.      Mariea Clonts, MD 05/04/14 [phone]

## 2014-05-04 NOTE — Progress Notes (Signed)
  Echocardiogram 2D Echocardiogram has been performed.  Diamond Nickel 05/04/2014, 3:15 PM

## 2014-05-04 NOTE — ED Notes (Signed)
hospitalist at the bedside 

## 2014-05-04 NOTE — Progress Notes (Signed)
68 year old lady came in for anterior chest pain, admitted for evaluation of ACS.  Cardiology consulted to see if she needs a stress evaluation.   Hosie Poisson, MD [phone].

## 2014-05-05 ENCOUNTER — Observation Stay (HOSPITAL_COMMUNITY): Payer: Medicare Other

## 2014-05-05 DIAGNOSIS — I10 Essential (primary) hypertension: Secondary | ICD-10-CM | POA: Diagnosis not present

## 2014-05-05 DIAGNOSIS — R079 Chest pain, unspecified: Secondary | ICD-10-CM

## 2014-05-05 DIAGNOSIS — E7800 Pure hypercholesterolemia, unspecified: Secondary | ICD-10-CM | POA: Diagnosis not present

## 2014-05-05 LAB — BASIC METABOLIC PANEL
ANION GAP: 10 (ref 5–15)
BUN: 14 mg/dL (ref 6–23)
CO2: 26 meq/L (ref 19–32)
CREATININE: 0.59 mg/dL (ref 0.50–1.10)
Calcium: 8.5 mg/dL (ref 8.4–10.5)
Chloride: 107 mEq/L (ref 96–112)
GFR calc non Af Amer: >90 mL/min (ref >90)
Glucose, Bld: 83 mg/dL (ref 70–99)
POTASSIUM: 3.6 meq/L — AB (ref 3.7–5.3)
Sodium: 143 mEq/L (ref 137–147)

## 2014-05-05 LAB — CBC
HEMATOCRIT: 34 % — AB (ref 36.0–46.0)
HEMOGLOBIN: 11.4 g/dL — AB (ref 12.0–15.0)
MCH: 33 pg (ref 26.0–34.0)
MCHC: 33.5 g/dL (ref 30.0–36.0)
MCV: 98.6 fL (ref 78.0–100.0)
Platelets: 160 10*3/uL (ref 150–400)
RBC: 3.45 MIL/uL — AB (ref 3.87–5.11)
RDW: 13 % (ref 11.5–15.5)
WBC: 6.5 10*3/uL (ref 4.0–10.5)

## 2014-05-05 MED ORDER — NITROGLYCERIN 0.4 MG SL SUBL
SUBLINGUAL_TABLET | SUBLINGUAL | Status: AC
  Administered 2014-05-05: 0.4 mg
  Filled 2014-05-05: qty 1

## 2014-05-05 MED ORDER — FAMOTIDINE 20 MG PO TABS: 20.0000 mg | ORAL_TABLET | Freq: Two times a day (BID) | ORAL | Status: DC

## 2014-05-05 MED ORDER — IOHEXOL 350 MG/ML SOLN
80.0000 mL | Freq: Once | INTRAVENOUS | Status: AC | PRN
  Administered 2014-05-05: 80 mL via INTRAVENOUS

## 2014-05-05 MED ORDER — POTASSIUM CHLORIDE CRYS ER 20 MEQ PO TBCR
40.0000 meq | EXTENDED_RELEASE_TABLET | Freq: Once | ORAL | Status: AC
  Administered 2014-05-05: 40 meq via ORAL
  Filled 2014-05-05: qty 2

## 2014-05-05 MED ORDER — METOPROLOL TARTRATE 25 MG PO TABS
25.0000 mg | ORAL_TABLET | Freq: Once | ORAL | Status: AC
  Administered 2014-05-05: 25 mg via ORAL
  Filled 2014-05-05: qty 1

## 2014-05-05 MED ORDER — NITROGLYCERIN 0.4 MG SL SUBL: 0.4000 mg | SUBLINGUAL_TABLET | SUBLINGUAL | Status: DC | PRN

## 2014-05-05 NOTE — Progress Notes (Signed)
Pts assessment unchanged from this am. D/c'd with family in stable condition to private vehicle

## 2014-05-05 NOTE — Discharge Summary (Signed)
Physician Discharge Summary  KIEU QUIGGLE NTZ:[id number] DOB: [birth year] DOA: 05/04/2014  PCP: [name], MD  Admit date: 05/04/2014 Discharge date: 05/05/2014  Time spent: 40 minutes  Recommendations for Outpatient Follow-up:  1. Follow up Hgb and BMET to reassess anemia and hypokalemia.  Follow up on chest pain to exclude non-cardic causes such as indigestion or GERD, and for restarting Aspirin  Discharge Diagnoses:  Principal Problem:   Chest pain with moderate risk for cardiac etiology Active Problems:   Essential hypertension, benign   Pure hypercholesterolemia   Multinodular goiter   Heterozygous factor V Leiden mutation   Discharge Condition: Stable  Diet recommendation: Heart modified diet  Filed Weights   05/04/14 1343 05/05/14 0400  Weight: 58.968 kg (130 lb) 59.376 kg (130 lb 14.4 oz)    History of present illness:   BELLAMY RUBEY is a 68 y.o. female with PMH of CAD, factor V Leiden disease, multinodular goiter, HTN, Multinodular goiter, and hyperlipidemia who presented with worsening chest pain for the past day.  She states she noticed mild chest pain mainly on exertion about 2 months ago when she is walking up a hill during exercise, associated with severe back pain 10/10, with occassional radiation to her jaw.  She states the pain states about 10 minutes. She then started to develop DOE about a month ago, and since then the chest pain and DOE have been consistent. Last night patient was awaken from sleep with chest pain that radiated to her jaw and ear. She took 6 baby ASA, and was brought to ED via ambulance. Patient is admitted for further workup   Hospital Course:  Chest pain  -Etiology likely non cardiac because cardiac workup was negative. Presented with both exertional and rest chest pain which was relieved with nitroglycerin. Workup included cardiac markers, EKG, CTA which were all negative. A 2D echo 04/2014 was normal with an EF of 60-65%.   Cardiolgoy was following the patient and recommended coronary CTA which was normal. Per Cardiology recommendations, will not add BB therapy due to borderline low resting heart rate and BPs.   -On discharge, continue statin and Lisinopril/HCTZ  -Avoid taking Aspirin-If your chest pain is caused by indigestion or GERD, Aspirin may exacerbate your symptoms.  -On discharge, start OTC indigestion medication such as Pepcid.  -Follow up with PCP to rule out other non cardic cause of chest pain such as indigestion or GERD, and for restarting Aspirin  Normocytic anemia  -Mild, Hgb 11.4  -Follow up CBC with PCP  Overactive Bladder  -Continue Oxybutynin   Hypokalemia  -Mild at 3.6  -Given one dose oral KDUR 40 meq -Follow up BMET with PCP  Factor V Liden - h/o DVT once with treatment with Xarelto - Not on chronic anticoagulant due to being heterzygous  Procedures:  2D echo- EF 60-65%; The cavity size was normal. Systolic function was  normal. Images were inadequate for LV wall motion assessment.  Consultations:  Cardiology  Discharge Exam: Filed Vitals:   05/05/14 1354  BP: 122/52  Pulse: 65  Temp: 98.7 F (37.1 C)  Resp: 16     Exam General: Alert and oriented Caucasian female in NAD. Laying comfortably in bed Eyes: Anicteric account.  Cardiovascular: Regular rate and rhythm.  No murmurs, rubs, or gallops. Respiratory: Clear to auscultate bilaterally.  No rhonchi or crepitations. Abdomen: Soft nontender bowel sounds present. No guarding or rigidity.  Musculoskeletal: No edema.  Psychiatric: Appears normal.  Neurologic: Alert awake oriented to time  place and person.    Discharge Instructions       Discharge Instructions   Diet - low sodium heart healthy    Complete by:  As directed      Increase activity slowly    Complete by:  As directed             Medication List    STOP taking these medications       aspirin 81 MG tablet      TAKE these medications        CALCIUM 600+D 600-400 MG-UNIT per tablet  Generic drug:  Calcium Carbonate-Vitamin D  Take 1 tablet by mouth daily.     famotidine 20 MG tablet  Commonly known as:  PEPCID  Take 1 tablet (20 mg total) by mouth 2 (two) times daily.     lisinopril-hydrochlorothiazide 20-12.5 MG per tablet  Commonly known as:  PRINZIDE,ZESTORETIC  TAKE 1 TABLET ONCE DAILY.     oxybutynin 10 MG 24 hr tablet  Commonly known as:  DITROPAN-XL  Take 10 mg by mouth daily.     simvastatin 40 MG tablet  Commonly known as:  ZOCOR  TAKE ONE TABLET AT BEDTIME.       Allergies  Allergen Reactions  . Naprosyn [Naproxen] Rash      The results of significant diagnostics from this hospitalization (including imaging, microbiology, ancillary and laboratory) are listed below for reference.    Significant Diagnostic Studies: Dg Chest 2 View  05/04/2014   CLINICAL DATA:  Chest pain.  EXAM: CHEST  2 VIEW  COMPARISON:  11/21/2010  FINDINGS: Normal heart size and mediastinal contours. No acute infiltrate or edema. No effusion or pneumothorax. No acute osseous findings.  IMPRESSION: No active cardiopulmonary disease.   Electronically Signed   By: Jorje Guild M.D.   On: 05/04/2014 00:47   Ct Angio Chest W/cm &/or Wo Cm  05/05/2014   ADDENDUM REPORT: 05/05/2014 13:46  CLINICAL DATA:  Chest pain  EXAM: Cardiac/Coronary  CT  TECHNIQUE: The patient was scanned on a Philips 256 scanner.  FINDINGS: A 120 kV prospective scan was triggered in the descending thoracic aorta at 111 HU's. Axial non-contrast 3 mm slices were carried out through the heart. The data set was analyzed on a dedicated work station and scored using the [place]. Gantry rotation speed was 270 msecs and collimation was .9 mm. No beta blockade and 0.4 mg of sl NTG was given. The 3D data set was reconstructed in 5% intervals of the 67-82 % of the R-R cycle. Diastolic phases were analyzed on a dedicated work station using MPR, MIP and VRT modes. The  patient received 80 cc of contrast.  Aorta:  Normal size aortic root and ascending aorta.  Aortic Valve:  Trileaflet.  Coronary Arteries: Originating in normal position. Right dominance.  Left main is a large caliber vessel with no plaque. LM gives rise to large LAD and LCX arteries.  LAD is a large caliber vessel that wraps around the apex. It gives rise to two diagonal branches. There is no plaque in LAD.  D1 is small vessel and has no plaque. D2 is a medium caliber vessel that feeds the entire anterolateral wall. There is no plaque.  LCX is medium caliber non-dominant vessel that gives rise to 2 obtuse marginal branches. There is no evidence of CAD.  RCA is a large caliber vessel that gives rise to PDA and PLVB. No plaque in RCA.  Large left atrial appendage has  no filling defect. Right atrium is mildly dilated.  IMPRESSION: 1. Coronary calcium score of 0. This was 0 percentile for age and sex matched control.  2.  Normal origin of coronary arteries.  Right dominance.  3.  No evidence of CAD.  Ena Dawley   Electronically Signed   By: Ena Dawley   On: 05/05/2014 13:46   05/05/2014   CLINICAL DATA:  Chest pain. Factor 5 Leiden. Shortness of breath. DVT history.  EXAM: CT ANGIOGRAPHY CHEST WITH CONTRAST  TECHNIQUE: Multidetector CT imaging of the chest was performed using the standard protocol during bolus administration of intravenous contrast. Multiplanar CT image reconstructions and MIPs were obtained to evaluate the vascular anatomy.  CONTRAST:  140mL OMNIPAQUE IOHEXOL 350 MG/ML SOLN  COMPARISON:  None.  FINDINGS: THORACIC INLET/BODY WALL:  1 cm nodule within the right thyroid gland, likely incidental based on size. No neighboring adenopathy.  MEDIASTINUM:  Normal heart size. No pericardial effusion. No acute vascular abnormality, including pulmonary embolism or aortic dissection. No adenopathy.  LUNG WINDOWS:  No consolidation.  No effusion.  No suspicious pulmonary nodule.  UPPER ABDOMEN:  No acute  findings.  OSSEOUS:  No acute fracture.  No suspicious lytic or blastic lesions.  Review of the MIP images confirms the above findings.  IMPRESSION: Negative for pulmonary embolism.  Electronically Signed: By: Jorje Guild M.D. On: 05/04/2014 04:51   Ct Coronary Morp W/cta Cor W/score W/ca W/cm &/or Wo/cm  05/05/2014   EXAM: OVER-READ INTERPRETATION  CT CHEST  The following report is an over-read performed by radiologist Dr. Rebekah Chesterfield [facility] on 05/05/2014. This over-read does not include interpretation of cardiac or coronary anatomy or pathology. The coronary calcium score/coronary CTA interpretation by the cardiologist is attached.  COMPARISON:  Chest CT 05/04/2014.  FINDINGS: Minimal dependent atelectasis in the lower lobes of the lungs bilaterally. Within the visualized portions of the thorax there are no suspicious appearing pulmonary nodules or masses, there is no acute consolidative airspace disease, no pleural effusion, no pneumothorax and no lymphadenopathy. Visualized portions of the upper abdomen are unremarkable. There are no aggressive appearing lytic or blastic lesions noted in the visualized portions of the skeleton.  IMPRESSION: 1. No significant incidental noncardiac findings noted.   Electronically Signed   By: Vinnie Langton M.D.   On: 05/05/2014 12:29    Microbiology: No results found for this or any previous visit (from the past 240 hour(s)).   Labs: Basic Metabolic Panel:  Recent Labs Lab 05/03/14 2352 05/05/14 0900  NA 143 143  K 3.6* 3.6*  CL 103 107  CO2 27 26  GLUCOSE 103* 83  BUN 24* 14  CREATININE 0.74 0.59  CALCIUM 9.2 8.5   Liver Function Tests: No results found for this basename: AST, ALT, ALKPHOS, BILITOT, PROT, ALBUMIN,  in the last 168 hours No results found for this basename: LIPASE, AMYLASE,  in the last 168 hours No results found for this basename: AMMONIA,  in the last 168 hours CBC:  Recent Labs Lab 05/03/14 2352  05/05/14 0900  WBC 7.4 6.5  HGB 12.2 11.4*  HCT 35.8* 34.0*  MCV 98.4 98.6  PLT 186 160   Cardiac Enzymes:  Recent Labs Lab 05/04/14 0258 05/04/14 0634 05/04/14 1239 05/04/14 1944  TROPONINI <0.30 <0.30 <0.30 <0.30   BNP: BNP (last 3 results) No results found for this basename: PROBNP,  in the last 8760 hours CBG: No results found for this basename: GLUCAP,  in the last  168 hours     Signed:  Lacy Duverney PA-C  [facility] 05/05/2014, 4:12 PM   I have examined the patient, reviewed the chart and modified the above note which I agree with.   Olden Klauer,MD Pager # on [email or web address] 05/05/2014, 4:18 PM

## 2014-05-05 NOTE — Progress Notes (Signed)
[facility] PROGRESS NOTE  Dorothy Rojas XBD:[id number] DOB: 1946/03/27 DOA: 05/04/2014 PCP: [name], MD  Assessment/Plan:   Chest pain -presented with both exertional and rest chest pain- chest pain is relieved with nitropatch -EKG- normal; cardiac markers negative; CTA - no PE  -2D echo 04/2014- EF 60-65%- normal echo -Appreciate Cardiology following- Will proceed with coronary CTA since patient is good candidate and further plans will follow CTA results. Not  on BB and may not be necessarily due to borderline low resting heart rate and BPs. -Continue statin and Lisinopril/HCTZ -Placed on Aspirin 325 mg PO -Nitropatch   Normocytic anemia -mild, Hgb 11.4 -Continue to monitor CBC  Overactive Bladder -continue Oxybutynin   -monitor BMET  Hypokalemia  -mild at 3.6 -given one dose oral KDUR 40 meq   DVT Prophylaxis Lovenox [place] Code Status: Full Family Communication: No family bedside Disposition Plan: Inpatient, home when stable   Consultants:  Cardiology  Procedures:  2D echo- EF 60-65%, left Ventricle cavity size was normal. Systolic function was  normal.  Images were inadequate for LV wall motion assessment.   Antibiotics:  None  HPI/Subjective: Dorothy Rojas is a 68 y.o. female with PMH of CAD. She has a history of heterzygous factor V Leiden disease, multinodular goiter, HTN, HLD and had a heart murmur as a child. She was admitted with chest pain and cardiology is asked to evaluate her.  About 6-8 weeks ago, she began waking in the middle of the night with severe midscapular back pain, 10/10, occasional radiation up into her jaw, No associated symptoms. It would last up to 10 minutes. She could be sleeping on either side or on her stomach (does not sleep on her back). She was getting these symptoms 1-2 symptoms. It would resolve without intervention, she would be able to get back to sleep.  Last night, things were different. The pain woke her about  11:15 pm, it was in her chest as well as her back. It went up her jaw and into her ear. She did not try any medications. A friend told her to take 6 baby ASA, which she did, and transported her to the ER. The pain lasted a little longer than usual, about 15 minutes, but resolved.  She began having episodes of the upper back pain and DOE when she has been walking for exercise or up a hill. The upper back pain started about the same time as the nocturnal pain, but the DOE did not start until about a month ago. Since then, the pain and SOB have been consistent with exertion   Objective: Filed Vitals:   05/05/14 0937  BP: 124/60  Pulse: 65  Temp:   Resp:    No intake or output data in the 24 hours ending 05/05/14 1117 Filed Weights   05/04/14 1343 05/05/14 0400  Weight: 58.968 kg (130 lb) 59.376 kg (130 lb 14.4 oz)    Exam:  Gen: Alert and oriented, in no acute distress  HEENT: Normocephalic, atraumatic.  Pupils symmertrical.  Moist mucosa.   Chest: clear to auscultate bilaterally, no ronchi or rales  Cardiac: Regular rate and rhythm, S1-S2, no rubs murmurs or gallops  Abdomen: soft, non tender, non distended, +bowel sounds. No guarding or rigidity  Extremities: Symmetrical in appearance without cyanosis or edema  Neurological: Alert awake oriented to time place and person.  Psychiatric: Appears normal.   Data Reviewed: Basic Metabolic Panel:  Recent Labs Lab 05/03/14 2352 05/05/14 0900  NA 143 143  K 3.6* 3.6*  CL 103 107  CO2 27 26  GLUCOSE 103* 83  BUN 24* 14  CREATININE 0.74 0.59  CALCIUM 9.2 8.5   Liver Function Tests: No results found for this basename: AST, ALT, ALKPHOS, BILITOT, PROT, ALBUMIN,  in the last 168 hours No results found for this basename: LIPASE, AMYLASE,  in the last 168 hours No results found for this basename: AMMONIA,  in the last 168 hours CBC:  Recent Labs Lab 05/03/14 2352 05/05/14 0900  WBC 7.4 6.5  HGB 12.2 11.4*  HCT 35.8* 34.0*   MCV 98.4 98.6  PLT 186 160   Cardiac Enzymes:  Recent Labs Lab 05/04/14 0258 05/04/14 0634 05/04/14 1239 05/04/14 1944  TROPONINI <0.30 <0.30 <0.30 <0.30   BNP (last 3 results) No results found for this basename: PROBNP,  in the last 8760 hours CBG: No results found for this basename: GLUCAP,  in the last 168 hours  No results found for this or any previous visit (from the past 240 hour(s)).   Studies: Dg Chest 2 View  05/04/2014   CLINICAL DATA:  Chest pain.  EXAM: CHEST  2 VIEW  COMPARISON:  11/21/2010  FINDINGS: Normal heart size and mediastinal contours. No acute infiltrate or edema. No effusion or pneumothorax. No acute osseous findings.  IMPRESSION: No active cardiopulmonary disease.   Electronically Signed   By: Jorje Guild M.D.   On: 05/04/2014 00:47   Ct Angio Chest W/cm &/or Wo Cm  05/04/2014   CLINICAL DATA:  Chest pain. Factor 5 Leiden. Shortness of breath. DVT history.  EXAM: CT ANGIOGRAPHY CHEST WITH CONTRAST  TECHNIQUE: Multidetector CT imaging of the chest was performed using the standard protocol during bolus administration of intravenous contrast. Multiplanar CT image reconstructions and MIPs were obtained to evaluate the vascular anatomy.  CONTRAST:  166mL OMNIPAQUE IOHEXOL 350 MG/ML SOLN  COMPARISON:  None.  FINDINGS: THORACIC INLET/BODY WALL:  1 cm nodule within the right thyroid gland, likely incidental based on size. No neighboring adenopathy.  MEDIASTINUM:  Normal heart size. No pericardial effusion. No acute vascular abnormality, including pulmonary embolism or aortic dissection. No adenopathy.  LUNG WINDOWS:  No consolidation.  No effusion.  No suspicious pulmonary nodule.  UPPER ABDOMEN:  No acute findings.  OSSEOUS:  No acute fracture.  No suspicious lytic or blastic lesions.  Review of the MIP images confirms the above findings.  IMPRESSION: Negative for pulmonary embolism.   Electronically Signed   By: Jorje Guild M.D.   On: 05/04/2014 04:51     Scheduled Meds: . aspirin  325 mg Oral Daily  . calcium-vitamin D  1 tablet Oral Q breakfast  . enoxaparin (LOVENOX) injection  40 mg Subcutaneous Q24H  . lisinopril  20 mg Oral Daily   And  . hydrochlorothiazide  12.5 mg Oral Daily  . nitroGLYCERIN  0.5 inch Topical 4 times per day  . oxybutynin  10 mg Oral Daily  . simvastatin  40 mg Oral q1800  . sodium chloride  3 mL Intravenous Q12H  . sodium chloride  3 mL Intravenous Q12H   Continuous Infusions: . sodium chloride 75 mL/hr at 05/04/14 2318    Principal Problem:   Chest pain with moderate risk for cardiac etiology Active Problems:   Essential hypertension, benign   Pure hypercholesterolemia   Multinodular goiter   Heterozygous factor V Leiden mutation    Time spent: [place], [place] [facility]  [facility] Pager [phone]. If 7PM-7AM, please  contact night-coverage at [email or web address], password [facility] 05/05/2014, 11:17 AM  LOS: 1 day      Patient evaluated- agree with above note that I have modified.   Debbe Odea, MD

## 2014-05-05 NOTE — Progress Notes (Signed)
   Patient ID: Dorothy Rojas, female   DOB: Apr 16, 1946, 68 y.o.   MRN: [id number]  68 y.o. female with no history of CAD. She has a history of heterzygous factor V Leiden disease, multinodular goiter, HTN, HLD and had a heart murmur as a child. She was admitted with chest pain and cardiology is asked to evaluate her. Plan was for coronary CTA today.  Subjective:  No further pain overnight.  Objective:  Vital Signs in the last 24 hours: Temp:  [98.2 F (36.8 C)-99 F (37.2 C)] 98.4 F (36.9 C) (09/25 0400) Pulse Rate:  [61-71] 62 (09/25 0400) Resp:  [14-26] 18 (09/25 0400) BP: (101-139)/(44-78) 101/49 mmHg (09/25 0400) SpO2:  [95 %-100 %] 98 % (09/25 0400) Weight:  [130 lb (58.968 kg)-130 lb 14.4 oz (59.376 kg)] 130 lb 14.4 oz (59.376 kg) (09/25 0400)  Intake/Output from previous day:   Intake/Output from this shift:    Physical Exam: General: Well developed, well nourished, female in no acute distress  Head: Eyes PERRLA, No xanthomas. Normocephalic and atraumatic, oropharynx without edema or exudate. Dentition: good  Lungs: essentially clear bilaterally  Heart: HRRR S1 S2, no rub/gallop, soft systolic murmur. pulses are 2+ extrem.  Neck: No carotid bruits. No lymphadenopathy. JVD not elevated.  Abdomen: Bowel sounds present, abdomen soft and non-tender without masses or hernias noted.  Msk: No spine or cva tenderness. No weakness, no joint deformities or effusions.  Extremities: No clubbing or cyanosis. edema.  Neuro: Alert and oriented X 3. No focal deficits noted.  Psych: Good affect, responds appropriately   Lab Results:  Recent Labs  05/03/14 2352  WBC 7.4  HGB 12.2  PLT 186    Recent Labs  05/03/14 2352  NA 143  K 3.6*  CL 103  CO2 27  GLUCOSE 103*  BUN 24*  CREATININE 0.74    Recent Labs  05/04/14 1239 05/04/14 1944  TROPONINI <0.30 <0.30   Cardiac Studies:  Echocardiogram: Normal LV size and function, EF 60-65%. An echo to evaluate LV  motion. No valve lesions noted.  Assessment/Plan:  Principal Problem:   Chest pain with moderate risk for cardiac etiology Active Problems:   Essential hypertension, benign   Pure hypercholesterolemia   Multinodular goiter   Heterozygous factor V Leiden mutation  Her symptoms described on admission had some classic/typical sounding features for exertional angina that was then occurring unprovoked at night. Low risk findings on EKG and biomarkers with normal echocardiogram. E. protocol CT did not show coronary calcifications but there was some mild calcific plaque noted on the thoracic and abdominal aorta.  The patient was seen last night by Dr. Sallyanne Kuster who recommended coronary CTA. She is an excellent candidate given her body habitus and resting heart rate in the 60s. She was hydrated overnight. Plan is coronary CTA today.  She is on statin and ACE inhibitor. Not currently on beta blocker which in the absence of known coronary disease and not necessarily be required given her baseline resting heart rate and blood pressures in the borderline low range.  Further plan following results of coronary CTA    LOS: 1 day    Kerrick Miler W 05/05/2014, 9:30 AM

## 2014-05-08 ENCOUNTER — Encounter: Payer: Self-pay | Admitting: Family Medicine

## 2014-05-08 ENCOUNTER — Ambulatory Visit (INDEPENDENT_AMBULATORY_CARE_PROVIDER_SITE_OTHER): Payer: Medicare Other | Admitting: Family Medicine

## 2014-05-08 VITALS — BP 110/66 | HR 76 | Ht 64.0 in | Wt 128.0 lb

## 2014-05-08 DIAGNOSIS — I10 Essential (primary) hypertension: Secondary | ICD-10-CM

## 2014-05-08 DIAGNOSIS — D6851 Activated protein C resistance: Secondary | ICD-10-CM

## 2014-05-08 DIAGNOSIS — Z79899 Other long term (current) drug therapy: Secondary | ICD-10-CM

## 2014-05-08 DIAGNOSIS — R5381 Other malaise: Secondary | ICD-10-CM

## 2014-05-08 DIAGNOSIS — K219 Gastro-esophageal reflux disease without esophagitis: Secondary | ICD-10-CM

## 2014-05-08 DIAGNOSIS — R079 Chest pain, unspecified: Secondary | ICD-10-CM

## 2014-05-08 DIAGNOSIS — R5383 Other fatigue: Secondary | ICD-10-CM | POA: Diagnosis not present

## 2014-05-08 DIAGNOSIS — D649 Anemia, unspecified: Secondary | ICD-10-CM

## 2014-05-08 DIAGNOSIS — D7589 Other specified diseases of blood and blood-forming organs: Secondary | ICD-10-CM

## 2014-05-08 DIAGNOSIS — D6859 Other primary thrombophilia: Secondary | ICD-10-CM

## 2014-05-08 DIAGNOSIS — Z23 Encounter for immunization: Secondary | ICD-10-CM | POA: Diagnosis not present

## 2014-05-08 DIAGNOSIS — E7800 Pure hypercholesterolemia, unspecified: Secondary | ICD-10-CM

## 2014-05-08 MED ORDER — ESOMEPRAZOLE MAGNESIUM 40 MG PO CPDR: 40.0000 mg | DELAYED_RELEASE_CAPSULE | Freq: Every day | ORAL | Status: DC

## 2014-05-08 NOTE — Patient Instructions (Signed)
  Let's treat for presumptive GI etiology of chest pain (given your normal cardiac work-up). Start nexium 40mg  once daily for a month (prescription). After a month, if no further problems, switch to OTC 20mg  dose for a month. If no further problems, then trial off Nexium.  If symptoms recur while not taking Nexium, then restart the OTC Nexium daily. Continue 81mg  aspirin--make sure it is coated, and/or take with food.  If you have ongoing problems with chest pain, we may need to refer you to a gastroenterologist for endoscopy.   Food Choices for Gastroesophageal Reflux Disease When you have gastroesophageal reflux disease (GERD), the foods you eat and your eating habits are very important. Choosing the right foods can help ease the discomfort of GERD. WHAT GENERAL GUIDELINES DO I NEED TO FOLLOW?  Choose fruits, vegetables, whole grains, low-fat dairy products, and low-fat meat, fish, and poultry.  Limit fats such as oils, salad dressings, butter, nuts, and avocado.  Keep a food diary to identify foods that cause symptoms.  Avoid foods that cause reflux. These may be different for different people.  Eat frequent small meals instead of three large meals each day.  Eat your meals slowly, in a relaxed setting.  Limit fried foods.  Cook foods using methods other than frying.  Avoid drinking alcohol.  Avoid drinking large amounts of liquids with your meals.  Avoid bending over or lying down until 2-3 hours after eating. WHAT FOODS ARE NOT RECOMMENDED? The following are some foods and drinks that may worsen your symptoms: Vegetables Tomatoes. Tomato juice. Tomato and spaghetti sauce. Chili peppers. Onion and garlic. Horseradish. Fruits Oranges, grapefruit, and lemon (fruit and juice). Meats High-fat meats, fish, and poultry. This includes hot dogs, ribs, ham, sausage, salami, and bacon. Dairy Whole milk and chocolate milk. Sour cream. Cream. Butter. Ice cream. Cream cheese.   Beverages Coffee and tea, with or without caffeine. Carbonated beverages or energy drinks. Condiments Hot sauce. Barbecue sauce.  Sweets/Desserts Chocolate and cocoa. Donuts. Peppermint and spearmint. Fats and Oils High-fat foods, including [place] fries and potato chips. Other Vinegar. Strong spices, such as black pepper, white pepper, red pepper, cayenne, curry powder, cloves, ginger, and chili powder. The items listed above may not be a complete list of foods and beverages to avoid. Contact your dietitian for more information. Document Released: 07/28/2005 Document Revised: 08/02/2013 Document Reviewed: 06/01/2013 [facility] Patient Information 2015 [place], [place]. This information is not intended to replace advice given to you by your health care provider. Make sure you discuss any questions you have with your health care provider.

## 2014-05-08 NOTE — Progress Notes (Signed)
Chief Complaint  Patient presents with  . Hospitalization Follow-up    follow up on hospitalization.    Admitted 9/24 and discharged 9/25 for evaluation of chest pain.  She woke up 11:15pm on 9/23 with chest pain.  She was home alone.  It started in her chest, and went to her back.  She was going to be leaving on a road trip this week, and wanted to get it checked out before driving.  She had been having similar symptoms intermittently over the last 6-8 weeks.  Describes it has a "radiating pain", between her shoulder blades and in her chest. It wakes her up from sleep.  She sits up, walks around.  It lasts about 5-10 minutes and then resolves. The episode on 9/23 involved more chest pain than the usual upper back pain.  She had extensive workup in the hospital. It was felt that the etiology was likely non cardiac.  Workup included cardiac markers, EKG, CTA which were all negative. A 2D echo 04/2014 was normal with an EF of 60-65%. Cardiology was following, and they did not recommend adding beta blocker therapy due to borderline low resting heart rate and BPs. It was recommended to have Hgb and b-met rechecked on follow-up to reassess anemia and hypokalemia.  She had been told only to use Pepcid prn recurrence of pain (which she has not had), not daily.  She has continued to take $Remov'81mg'iOUABC$  aspirin daily.  As noted at her last visit, she has been belching frequently over the last couple of months.  Notes per hematologist suggested checking B12, folate and TSH for MCV 101.4.  MCV <100 on last 2 checks while in the hospital.  Last TSH was 04/2013. She has multinodular goiter.  Past Medical History  Diagnosis Date  . Hypertension   . Elevated cholesterol   . Osteopenia     last DEXA 10/2009  . Former smoker     Quit in 1984  . Mixed stress and urge urinary incontinence     Dr. McDiarmid  . Torn meniscus     L knee  . Heterozygous factor V Leiden mutation 07/2013  . DVT (deep venous thrombosis) 07/2013     a. L peroneal vein 07/2013, completed 6 months of Xarelto.  . Microscopic hematuria 01/17/2014    -chronic colonization  . Multinodular thyroid     a. Path benign 2013.   Past Surgical History  Procedure Laterality Date  . Tonsillectomy    . Total abdominal hysterectomy w/ bilateral salpingoophorectomy  04/1999  . Appendectomy  1973  . Ovarian cyst removal  1973    LEFT OVARIAN  . Incontinence surgery  04/1999    Re-do at [facility] for sling complications in 4034  . Neuroma surgery  1980's    bilateral feet  . Lateral epicondyle release      Right  . Cystocele repair  11/2010    Dr. McDiarmid  . Knee arthroscopy Right 04/05/13    Dr. Onnie Graham  . Cystoscopy  01/17/2014    normal   History   Social History  . Marital Status: Married    Spouse Name: N/A    Number of Children: 3  . Years of Education: N/A   Occupational History  .     Social History Main Topics  . Smoking status: Former Smoker    Quit date: 08/11/1985  . Smokeless tobacco: Never Used  . Alcohol Use: Yes     Comment: 1 glass of wine on weekends  only (2-3/week)  . Drug Use: No  . Sexual Activity: Not Currently   Other Topics Concern  . Not on file   Social History Narrative   Married.  Lives with husband.  Children live in [place], [place].  Youngest child was killed in [place].  3 stepchildren are all local. 1 dog (Muffin lost vision 2015 due to retinal detachment)   Outpatient Encounter Prescriptions as of 05/08/2014  Medication Sig  . Calcium Carbonate-Vitamin D (CALCIUM 600+D) 600-400 MG-UNIT per tablet Take 1 tablet by mouth daily.  [name] oxybutynin (DITROPAN-XL) 10 MG 24 hr tablet Take 10 mg by mouth daily.   . simvastatin (ZOCOR) 40 MG tablet TAKE ONE TABLET AT BEDTIME.  [name] esomeprazole (NEXIUM) 40 MG capsule Take 1 capsule (40 mg total) by mouth daily.  . famotidine (PEPCID) 20 MG tablet Take 1 tablet (20 mg total) by mouth 2 (two) times daily.  [name] lisinopril-hydrochlorothiazide (PRINZIDE,ZESTORETIC) 20-12.5 MG per  tablet TAKE 1 TABLET ONCE DAILY.   (nexium started today, NOT prior to visit)  Allergies  Allergen Reactions  . Naprosyn [Naproxen] Rash    ROS:  No fever, chills, headaches, dizziness, recurrent chest pain, no palpitations nausea, vomiting, abdominal pain, bowel changes.  +belching.  No bleeding, bruising, rashes.  No leg swelling, pain.  No URI symptoms, cough, shortness of breath.  PHYSICAL EXAM:  BP 110/66  Pulse 76  Ht $R'5\' 4"'uJ$  (1.626 m)  Wt 128 lb (58.06 kg)  BMI 21.96 kg/m2 Well developed, pleasant female in no distress HEENT:  PERRL, EOMI, conjunctiva clear. OP clear Neck: no lymphadenopathy or mass Heart: regular rate and rhythm without murmur Lungs: clear bilaterally Abdomen: mild epigastric tenderness.  No organomegaly or mass, negative Murphy sign.  Normal bowel sounds, no masses Extremities: no edema Psych: normal mood, affect, hygiene and grooming Neuro: alert and oriented, normal strength, gait  ASSESSMENT/PLAN:  Chest pain, unspecified chest pain type - negative cardiac w/u.  Given belching, epigastric pain, suspect GI etiology (reflux, gastritis).  Treat with PPI.  Continue aspirin  Gastroesophageal reflux disease, esophagitis presence not specified - counseled re: meds, diet, behavior modification; sleep with elevated head of bed - Plan: esomeprazole (NEXIUM) 40 MG capsule  Need for prophylactic vaccination and inoculation against influenza - Plan: Flu vaccine HIGH DOSE PF (Fluzone Tri High dose)  Anemia, unspecified anemia type - very mild.  recheck today - Plan: CBC with Differential, Vitamin B12, Folate  Macrocytosis - normal on last 2 labs in hospital.  recheck with CBC, as well as w/u as rec by hematologist - Plan: CBC with Differential, TSH, Vitamin B12, Folate  Encounter for long-term (current) use of other medications - Plan: Basic metabolic panel  Other malaise and fatigue - Plan: CBC with Differential, TSH, Basic metabolic panel, Vitamin Q30,  Folate  Heterozygous factor V Leiden mutation - continue aspirin (especially with upcoming travel/plane flight)  Essential hypertension, benign - well controlled  Pure hypercholesterolemia - controlled, at goal (LDL 49)   Let's treat for presumptive GI etiology of chest pain (given your normal cardiac work-up). Start nexium $RemoveBefo'40mg'QOBHzlYzSJR$  once daily for a month (prescription). After a month, if no further problems, switch to OTC $Remo'20mg'QLafy$  dose for a month. If no further problems, then trial off Nexium.  If symptoms recur while not taking Nexium, then restart the OTC Nexium daily. Continue $RemoveBeforeD'81mg'EWHMbBBooYfcTd$  aspirin--make sure it is coated, and/or take with food.

## 2014-05-09 LAB — CBC WITH DIFFERENTIAL/PLATELET
BASOS PCT: 0 % (ref 0–1)
Basophils Absolute: 0 10*3/uL (ref 0.0–0.1)
Eosinophils Absolute: 0.2 10*3/uL (ref 0.0–0.7)
Eosinophils Relative: 2 % (ref 0–5)
HCT: 33.2 % — ABNORMAL LOW (ref 36.0–46.0)
HEMOGLOBIN: 11.5 g/dL — AB (ref 12.0–15.0)
Lymphocytes Relative: 34 % (ref 12–46)
Lymphs Abs: 2.6 10*3/uL (ref 0.7–4.0)
MCH: 33 pg (ref 26.0–34.0)
MCHC: 34.6 g/dL (ref 30.0–36.0)
MCV: 95.4 fL (ref 78.0–100.0)
Monocytes Absolute: 0.6 10*3/uL (ref 0.1–1.0)
Monocytes Relative: 8 % (ref 3–12)
NEUTROS PCT: 56 % (ref 43–77)
Neutro Abs: 4.2 10*3/uL (ref 1.7–7.7)
PLATELETS: 187 10*3/uL (ref 150–400)
RBC: 3.48 MIL/uL — AB (ref 3.87–5.11)
RDW: 14 % (ref 11.5–15.5)
WBC: 7.5 10*3/uL (ref 4.0–10.5)

## 2014-05-09 LAB — BASIC METABOLIC PANEL
BUN: 25 mg/dL — ABNORMAL HIGH (ref 6–23)
CALCIUM: 10 mg/dL (ref 8.4–10.5)
CO2: 30 mEq/L (ref 19–32)
CREATININE: 0.78 mg/dL (ref 0.50–1.10)
Chloride: 102 mEq/L (ref 96–112)
Glucose, Bld: 105 mg/dL — ABNORMAL HIGH (ref 70–99)
Potassium: 4.3 mEq/L (ref 3.5–5.3)
Sodium: 141 mEq/L (ref 135–145)

## 2014-05-09 LAB — FOLATE: FOLATE: 20 ng/mL

## 2014-05-09 LAB — VITAMIN B12: VITAMIN B 12: 431 pg/mL (ref 211–911)

## 2014-05-09 LAB — TSH: TSH: 3.25 u[IU]/mL (ref 0.350–4.500)

## 2014-05-10 ENCOUNTER — Encounter: Payer: Self-pay | Admitting: Family Medicine

## 2014-05-29 DIAGNOSIS — H25011 Cortical age-related cataract, right eye: Secondary | ICD-10-CM | POA: Diagnosis not present

## 2014-05-29 DIAGNOSIS — H2513 Age-related nuclear cataract, bilateral: Secondary | ICD-10-CM | POA: Diagnosis not present

## 2014-05-29 DIAGNOSIS — H2511 Age-related nuclear cataract, right eye: Secondary | ICD-10-CM | POA: Diagnosis not present

## 2014-05-29 DIAGNOSIS — H25013 Cortical age-related cataract, bilateral: Secondary | ICD-10-CM | POA: Diagnosis not present

## 2014-05-31 ENCOUNTER — Encounter: Payer: Self-pay | Admitting: Family Medicine

## 2014-06-12 ENCOUNTER — Encounter: Payer: Self-pay | Admitting: Family Medicine

## 2014-06-14 DIAGNOSIS — H2511 Age-related nuclear cataract, right eye: Secondary | ICD-10-CM | POA: Diagnosis not present

## 2014-06-14 DIAGNOSIS — H25011 Cortical age-related cataract, right eye: Secondary | ICD-10-CM | POA: Diagnosis not present

## 2014-06-21 DIAGNOSIS — H2511 Age-related nuclear cataract, right eye: Secondary | ICD-10-CM | POA: Diagnosis not present

## 2014-06-21 DIAGNOSIS — H25011 Cortical age-related cataract, right eye: Secondary | ICD-10-CM | POA: Diagnosis not present

## 2014-06-21 DIAGNOSIS — H25012 Cortical age-related cataract, left eye: Secondary | ICD-10-CM | POA: Diagnosis not present

## 2014-06-28 DIAGNOSIS — H25012 Cortical age-related cataract, left eye: Secondary | ICD-10-CM | POA: Diagnosis not present

## 2014-06-28 DIAGNOSIS — H2512 Age-related nuclear cataract, left eye: Secondary | ICD-10-CM | POA: Diagnosis not present

## 2014-08-17 DIAGNOSIS — M25561 Pain in right knee: Secondary | ICD-10-CM | POA: Diagnosis not present

## 2014-08-17 DIAGNOSIS — M25562 Pain in left knee: Secondary | ICD-10-CM | POA: Diagnosis not present

## 2014-09-14 DIAGNOSIS — N3942 Incontinence without sensory awareness: Secondary | ICD-10-CM | POA: Diagnosis not present

## 2014-09-14 DIAGNOSIS — N3020 Other chronic cystitis without hematuria: Secondary | ICD-10-CM | POA: Diagnosis not present

## 2014-10-12 DIAGNOSIS — Z961 Presence of intraocular lens: Secondary | ICD-10-CM | POA: Diagnosis not present

## 2014-10-16 DIAGNOSIS — N393 Stress incontinence (female) (male): Secondary | ICD-10-CM | POA: Diagnosis not present

## 2014-10-16 DIAGNOSIS — R351 Nocturia: Secondary | ICD-10-CM | POA: Diagnosis not present

## 2014-10-19 ENCOUNTER — Telehealth: Payer: Self-pay | Admitting: Family Medicine

## 2014-10-19 DIAGNOSIS — E7800 Pure hypercholesterolemia, unspecified: Secondary | ICD-10-CM

## 2014-10-19 DIAGNOSIS — I10 Essential (primary) hypertension: Secondary | ICD-10-CM

## 2014-10-19 DIAGNOSIS — D649 Anemia, unspecified: Secondary | ICD-10-CM

## 2014-10-19 NOTE — Telephone Encounter (Signed)
Pt came in this morning for her cpe with Dr Tomi Bamberger.  We do not show an appt on the books.  Pt was upset and left.  I called pt to get the details.  She said when she was here in September that she made todays appt for a cpe and put it in her phone.  I apologized to her and also explained that Dr. Tomi Bamberger would be calling her on Monday.  And that Dr. Tomi Bamberger and I would look at her calendar Monday and come up with another cpe for near future and would call her with that info.

## 2014-10-20 NOTE — Telephone Encounter (Signed)
Spoke with patient.  She will come 3/23 and for labs on 3/22.  Entering labs now.  She will need MyChart reactivated (she believes she may have done something to block her, when trying to access it through her phone).

## 2014-10-26 ENCOUNTER — Other Ambulatory Visit: Payer: Self-pay | Admitting: Family Medicine

## 2014-10-26 DIAGNOSIS — Z1231 Encounter for screening mammogram for malignant neoplasm of breast: Secondary | ICD-10-CM

## 2014-10-31 ENCOUNTER — Other Ambulatory Visit: Payer: Medicare Other

## 2014-10-31 DIAGNOSIS — I10 Essential (primary) hypertension: Secondary | ICD-10-CM | POA: Diagnosis not present

## 2014-10-31 DIAGNOSIS — E7800 Pure hypercholesterolemia, unspecified: Secondary | ICD-10-CM

## 2014-10-31 DIAGNOSIS — D649 Anemia, unspecified: Secondary | ICD-10-CM | POA: Diagnosis not present

## 2014-10-31 LAB — COMPREHENSIVE METABOLIC PANEL
ALT: 23 U/L (ref 0–35)
AST: 23 U/L (ref 0–37)
Albumin: 4.3 g/dL (ref 3.5–5.2)
Alkaline Phosphatase: 52 U/L (ref 39–117)
BILIRUBIN TOTAL: 0.5 mg/dL (ref 0.2–1.2)
BUN: 16 mg/dL (ref 6–23)
CO2: 32 meq/L (ref 19–32)
CREATININE: 0.68 mg/dL (ref 0.50–1.10)
Calcium: 9.3 mg/dL (ref 8.4–10.5)
Chloride: 99 mEq/L (ref 96–112)
GLUCOSE: 104 mg/dL — AB (ref 70–99)
POTASSIUM: 4.5 meq/L (ref 3.5–5.3)
Sodium: 136 mEq/L (ref 135–145)
Total Protein: 6.7 g/dL (ref 6.0–8.3)

## 2014-10-31 LAB — CBC WITH DIFFERENTIAL/PLATELET
BASOS ABS: 0 10*3/uL (ref 0.0–0.1)
BASOS PCT: 0 % (ref 0–1)
Eosinophils Absolute: 0.1 10*3/uL (ref 0.0–0.7)
Eosinophils Relative: 2 % (ref 0–5)
HEMATOCRIT: 35.5 % — AB (ref 36.0–46.0)
Hemoglobin: 11.8 g/dL — ABNORMAL LOW (ref 12.0–15.0)
Lymphocytes Relative: 36 % (ref 12–46)
Lymphs Abs: 2.5 10*3/uL (ref 0.7–4.0)
MCH: 32 pg (ref 26.0–34.0)
MCHC: 33.2 g/dL (ref 30.0–36.0)
MCV: 96.2 fL (ref 78.0–100.0)
MONOS PCT: 9 % (ref 3–12)
MPV: 9.9 fL (ref 8.6–12.4)
Monocytes Absolute: 0.6 10*3/uL (ref 0.1–1.0)
NEUTROS ABS: 3.7 10*3/uL (ref 1.7–7.7)
NEUTROS PCT: 53 % (ref 43–77)
PLATELETS: 267 10*3/uL (ref 150–400)
RBC: 3.69 MIL/uL — ABNORMAL LOW (ref 3.87–5.11)
RDW: 13.8 % (ref 11.5–15.5)
WBC: 7 10*3/uL (ref 4.0–10.5)

## 2014-10-31 LAB — LIPID PANEL
CHOLESTEROL: 137 mg/dL (ref 0–200)
HDL: 40 mg/dL — ABNORMAL LOW (ref >=46)
LDL Cholesterol: 70 mg/dL (ref 0–99)
Total CHOL/HDL Ratio: 3.4 Ratio
Triglycerides: 136 mg/dL (ref <150)
VLDL: 27 mg/dL (ref 0–40)

## 2014-11-01 ENCOUNTER — Encounter: Payer: Self-pay | Admitting: Family Medicine

## 2014-11-01 ENCOUNTER — Ambulatory Visit (INDEPENDENT_AMBULATORY_CARE_PROVIDER_SITE_OTHER): Payer: Medicare Other | Admitting: Family Medicine

## 2014-11-01 VITALS — BP 128/76 | HR 72 | Ht 64.0 in | Wt 130.2 lb

## 2014-11-01 DIAGNOSIS — R7301 Impaired fasting glucose: Secondary | ICD-10-CM | POA: Diagnosis not present

## 2014-11-01 DIAGNOSIS — E7800 Pure hypercholesterolemia, unspecified: Secondary | ICD-10-CM

## 2014-11-01 DIAGNOSIS — Z23 Encounter for immunization: Secondary | ICD-10-CM | POA: Diagnosis not present

## 2014-11-01 DIAGNOSIS — K219 Gastro-esophageal reflux disease without esophagitis: Secondary | ICD-10-CM | POA: Diagnosis not present

## 2014-11-01 DIAGNOSIS — K5900 Constipation, unspecified: Secondary | ICD-10-CM | POA: Diagnosis not present

## 2014-11-01 DIAGNOSIS — E042 Nontoxic multinodular goiter: Secondary | ICD-10-CM

## 2014-11-01 DIAGNOSIS — Z0000 Encounter for general adult medical examination without abnormal findings: Secondary | ICD-10-CM | POA: Diagnosis not present

## 2014-11-01 DIAGNOSIS — Z7189 Other specified counseling: Secondary | ICD-10-CM | POA: Diagnosis not present

## 2014-11-01 DIAGNOSIS — I6523 Occlusion and stenosis of bilateral carotid arteries: Secondary | ICD-10-CM | POA: Diagnosis not present

## 2014-11-01 DIAGNOSIS — M8580 Other specified disorders of bone density and structure, unspecified site: Secondary | ICD-10-CM | POA: Diagnosis not present

## 2014-11-01 DIAGNOSIS — Z5181 Encounter for therapeutic drug level monitoring: Secondary | ICD-10-CM

## 2014-11-01 DIAGNOSIS — I10 Essential (primary) hypertension: Secondary | ICD-10-CM | POA: Diagnosis not present

## 2014-11-01 DIAGNOSIS — Z01419 Encounter for gynecological examination (general) (routine) without abnormal findings: Secondary | ICD-10-CM | POA: Diagnosis not present

## 2014-11-01 LAB — POCT GLYCOSYLATED HEMOGLOBIN (HGB A1C): Hemoglobin A1C: 6

## 2014-11-01 NOTE — Patient Instructions (Signed)
  HEALTH MAINTENANCE RECOMMENDATIONS:  It is recommended that you get at least 30 minutes of aerobic exercise at least 5 days/week (for weight loss, you may need as much as 60-90 minutes). This can be any activity that gets your heart rate up. This can be divided in 10-15 minute intervals if needed, but try and build up your endurance at least once a week.  Weight bearing exercise is also recommended twice weekly.  Eat a healthy diet with lots of vegetables, fruits and fiber.  "Colorful" foods have a lot of vitamins (ie green vegetables, tomatoes, red peppers, etc).  Limit sweet tea, regular sodas and alcoholic beverages, all of which has a lot of calories and sugar.  Up to 1 alcoholic drink daily may be beneficial for women (unless trying to lose weight, watch sugars).  Drink a lot of water.  Calcium recommendations are 1200-1500 mg daily (1500 mg for postmenopausal women or women without ovaries), and vitamin D 1000 IU daily.  This should be obtained from diet and/or supplements (vitamins), and calcium should not be taken all at once, but in divided doses.  Monthly self breast exams and yearly mammograms for women over the age of 43 is recommended.  Sunscreen of at least SPF 30 should be used on all sun-exposed parts of the skin when outside between the hours of 10 am and 4 pm (not just when at beach or pool, but even with exercise, golf, tennis, and yard work!)  Use a sunscreen that says "broad spectrum" so it covers both UVA and UVB rays, and make sure to reapply every 1-2 hours.  Remember to change the batteries in your smoke detectors when changing your clock times in the spring and fall.  Use your seat belt every time you are in a car, and please drive safely and not be distracted with cell phones and texting while driving.  Reflux:  Change your Nexium to taking it prior to dinner.  If you still have symptoms despite this change, you can either start taking it twice daily, or call for the high  dose prescription Nexium. Elevate the head of the bed.  Constipation--high fiber diet, drink plenty of water, continue regular exercise.  Try taking colace (docusate sodium) once daily (and back off if stools get too soft).  Consider compression stockings and limit the sodium in your diet to help with the swelling in your legs.

## 2014-11-01 NOTE — Progress Notes (Signed)
Chief Complaint  Patient presents with  . Med check plus    nonfasting med check plus with pelvic exam. Still taking nexium but is having one time per week/chest and up the left side of her neck and into her ear. Right sided scapula area-feels like something "zapping" her from time to time. Also having some issues with dropping things from time to time.    Dorothy Rojas is a 69 y.o. female who presents for annual wellness visit and follow-up on chronic medical conditions.  She has the following concerns:  Hypertension follow-up: Blood pressures elsewhere are 120-130's/60's-70's. Denies dizziness, chest pain. Denies side effects of medications. Denies headaches. She denies exertional chest pain (just as per reflux section below).  She does have some slight swelling in her ankles, never present when she first wakes up.  GERD:  Has been on Nexium since ER visit for chest pain 6 months ago.  Overall she had been doing better.  Over the last 3 weeks, she started having symptoms again once a week.  This wakes her up at night.  She is on the OTC medication, taking it in the morning.   Hematuria--underwent eval by Dr. McDiarmid with normal cystoscopy. She had CT which showed some cysts--he felt all was okay. Bladder is colonized with bacteria, and will always show bacteria in urine. She continues to have urinary incontinence: She did better on Myrbetriq and PTNS than on oxybutynin but it wasn't covered.  She later changed back to [facility] and found no benefit.  She is schedule for further urodynamic studies soon with Dr. Wendy Poet.  Denies dysuria, visible hematuria.  Hyperlipidemia follow-up: Patient is reportedly following a low-fat, low cholesterol diet. Compliant with medications and denies medication side effects. She occasionally gets some leg cramps, more related to the shoes she is wearing--this has eased up since last visit.  H/o DVT--she completed 6 months of xarelto.She was  discharged from the care of hematologist.  If any recurrence, will need lifelong treatment.  She is taking 69m of ASA daily. No other blood thinning agents were recommended.  Constipation--some blood this morning when she needed to manually remove a small piece of hard stool.  Once used stool softener in the past-took two pills and it worked "too well".  Carotid artery disease.  Last u/s was 04/2013: IMPRESSION: Stable carotid artery duplex exam. Mild atherosclerotic disease in the carotid arteries. Estimated degree of stenosis in the internal carotid arteries is less than 50% bilaterally. She hasn't had any neuro symptoms.   Immunization History  Administered Date(s) Administered  . Influenza Whole 04/09/2011  . Influenza, High Dose Seasonal PF 05/08/2014  . Influenza,inj,Quad PF,36+ Mos 04/14/2013  . Pneumococcal Polysaccharide-23 04/09/2011  . Td 08/07/2006  . Zoster 10/25/2008   Last Pap smear: can't recall. S/p hysterectomy for benign reasons  Last mammogram: 11/2013 Last colonoscopy: 2007 with Dr. OLajoyce Corners [facility] worse in spine compared to 2011. Ophtho: yearly  Dentist: regularly (3x/year)  Exercise: regular   Other doctors caring for patient include: Dr. McDiarmid (urology)  Dr. SGershon Crane(ophtho)  Dr. OLajoyce Corners(GI--hasn't seen since colonoscopy)  Dr. SConley Canal(dentist)  Dr. CJuliann Mule(hematologist)--released from his care Dr. OAlvan Dame(ortho)  Depression screen:  See scanned questionnaire.  Completely negative screen ADL screen:  See scanned questionnaire.  Notable only for slight/occasional trouble with her hearing (she states her husband speaks quietly).  End of Life Discussion:  Patient has a living will and medical power  of attorney  Past Medical History  Diagnosis Date  . Hypertension   . Elevated cholesterol   . Osteopenia     last DEXA 10/2009  . Former smoker     Quit in 1984  . Mixed stress and urge urinary  incontinence     Dr. McDiarmid  . Torn meniscus     L knee  . Heterozygous factor V Leiden mutation 07/2013  . DVT (deep venous thrombosis) 07/2013    a. L peroneal vein 07/2013, completed 6 months of Xarelto.  . Microscopic hematuria 01/17/2014    -chronic colonization  . Multinodular thyroid     a. Path benign 2013.    Past Surgical History  Procedure Laterality Date  . Tonsillectomy    . Total abdominal hysterectomy w/ bilateral salpingoophorectomy  04/1999  . Appendectomy  1973  . Ovarian cyst removal  1973    LEFT OVARIAN  . Incontinence surgery  04/1999    Re-do at [facility] for sling complications in 0177  . Neuroma surgery  1980's    bilateral feet  . Lateral epicondyle release Right   . Cystocele repair  11/2010    Dr. McDiarmid  . Knee arthroscopy Right 04/05/13    Dr. Onnie Graham  . Cystoscopy  01/17/2014    normal    History   Social History  . Marital Status: Married    Spouse Name: N/A  . Number of Children: 3  . Years of Education: N/A   Occupational History  .     Social History Main Topics  . Smoking status: Former Smoker    Quit date: 08/11/1985  . Smokeless tobacco: Never Used  . Alcohol Use: Yes     Comment: 1 glass of wine on weekends only (2-3/week)  . Drug Use: No  . Sexual Activity: Not Currently   Other Topics Concern  . Not on file   Social History Narrative   Married.  Lives with husband.  Children live in [place], [place].  Youngest child was killed in [place].  3 stepchildren are all local. 1 dog (Muffin lost vision 2015 due to retinal detachment)    Family History  Problem Relation Age of Onset  . Hypertension Father   . Heart disease Father 93    CABG, died from MI at 54  . COPD Father   . Cirrhosis Mother     related to psoriasis medications  . Diabetes Mother   . Osteogenesis imperfecta Sister   . Heart disease Sister     CHF  . Diabetes Brother   . Heart disease Brother 50    MI  . Deep vein thrombosis Brother     factor V Leiden  NEGATIVE  . Cancer Neg Hx     Outpatient Encounter Prescriptions as of 11/01/2014  Medication Sig  . aspirin 81 MG tablet Take 81 mg by mouth daily.  . Calcium Carbonate-Vitamin D (CALCIUM 600+D) 600-400 MG-UNIT per tablet Take 1 tablet by mouth daily.  [name] esomeprazole (NEXIUM) 40 MG capsule Take 1 capsule (40 mg total) by mouth daily.  [name] lisinopril-hydrochlorothiazide (PRINZIDE,ZESTORETIC) 20-12.5 MG per tablet TAKE 1 TABLET ONCE DAILY.  [name] Omega-3 Fatty Acids (FISH OIL) 1200 MG CPDR Take 1 capsule by mouth daily.  . simvastatin (ZOCOR) 40 MG tablet TAKE ONE TABLET AT BEDTIME.  . [DISCONTINUED] famotidine (PEPCID) 20 MG tablet Take 1 tablet (20 mg total) by mouth 2 (two) times daily.  . [DISCONTINUED] oxybutynin (DITROPAN-XL) 10 MG 24 hr tablet Take 10  mg by mouth daily.     Allergies  Allergen Reactions  . Naprosyn [Naproxen] Rash   ROS:The patient denies anorexia, fever, weight changes, headaches, vision changes, decreased hearing, ear pain, sore throat, breast concerns, chest pain, palpitations, syncope, dyspnea on exertion, cough, swelling, nausea, vomiting, diarrhea, abdominal pain, melena, hematochezia, hematuria, dysuria, vaginal bleeding, discharge, odor or itch, genital lesions, numbness, tingling, weakness, tremor, suspicious skin lesions, depression, anxiety, abnormal bleeding or enlarged lymph nodes.  Occasional L knee pain with certain activities (walking hills). Has also been having some right knee pain--intermittent. Seeing Dr. Alvan Dame, and gets coritsone shots bilaterally every 4 months, which helps. Slight PND/throat-clearing intermittently (resolved now). Constipation--small hard stools.  She sometimes needs to use her finger to remove the last bit of hard stool--noticed some blood this morning. Intermittent "zapping" sensation internally underneath the right shoulder blade. Sometimes twice a day, other times none.  Not related to any activity. Very short lived, few  seconds. Some sweats (between breasts)   PHYSICAL EXAM:  BP 128/76 mmHg  Pulse 72  Ht _0  (1.626 m)  Wt 130 lb 3.2 oz (59.058 kg)  BMI 22.34 kg/m2  General Appearance:  Alert, cooperative, no distress, appears stated age   Head:  Normocephalic, without obvious abnormality, atraumatic   Eyes:  PERRL, conjunctiva/corneas clear, EOM's intact, fundi  benign   Ears:  Normal TM's and external ear canals   Nose:  Nares normal, no drainage or sinus tenderness; Nasal mucosa mild-mod edematous, pale  Throat:  Lips, mucosa, and tongue normal; teeth and gums normal   Neck:  Supple, no lymphadenopathy; thyroid: no enlargement/tenderness/nodules; no carotid  bruit or JVD   Back:  Spine nontender, no curvature, ROM normal, no CVA tenderness. nontender at area of discomfort at R scapula, no muscle spasm, rash  Lungs:  Clear to auscultation bilaterally without wheezes, rales or ronchi; respirations unlabored   Chest Wall:  No tenderness or deformity   Heart:  Regular rate and rhythm, S1 and S2 normal, no murmur, rub  or gallop   Breast Exam:  No tenderness, masses, or nipple discharge. Nipples are inverted bilaterally (chronic, per pt). No axillary lymphadenopathy. Slight fibroglandular changes with tenderness on left inferior breast  Abdomen:  Soft, non-tender, nondistended, normoactive bowel sounds,  no masses, no hepatosplenomegaly   Genitalia:  Normal external genitalia without lesions. BUS and vagina normal; Uterus and adnexa surgically absent. No masses palpable   Rectal:  Normal tone, no masses or tenderness; guaiac--trace heme+ stool  Extremities:  No clubbing, cyanosis or edema. No cords, no edema.  Pulses:  2+ and symmetric all extremities   Skin:  Skin color, texture, turgor normal, no rashes or suspicious lesions. Many SK's.   Lymph nodes:  Cervical, supraclavicular, and axillary nodes normal   Neurologic:  CNII-XII intact, normal strength,  sensation and gait; reflexes 2+ and symmetric throughout    Psych: Normal mood, affect, hygiene and grooming        Lab Results  Component Value Date   WBC 7.0 10/31/2014   HGB 11.8* 10/31/2014   HCT 35.5* 10/31/2014   MCV 96.2 10/31/2014   PLT 267 10/31/2014   Lab Results  Component Value Date   CHOL 137 10/31/2014   HDL 40* 10/31/2014   LDLCALC 70 10/31/2014   TRIG 136 10/31/2014   CHOLHDL 3.4 10/31/2014     Chemistry      Component Value Date/Time   NA 136 10/31/2014 0854   NA 144 01/26/2014 0809  K 4.5 10/31/2014 0854   K 4.6 01/26/2014 0809   CL 99 10/31/2014 0854   CO2 32 10/31/2014 0854   CO2 29 01/26/2014 0809   BUN 16 10/31/2014 0854   BUN 18.5 01/26/2014 0809   CREATININE 0.68 10/31/2014 0854   CREATININE 0.59 05/05/2014 0900   CREATININE 0.8 01/26/2014 0809      Component Value Date/Time   CALCIUM 9.3 10/31/2014 0854   CALCIUM 9.4 01/26/2014 0809   ALKPHOS 52 10/31/2014 0854   ALKPHOS 57 10/13/2013 0910   AST 23 10/31/2014 0854   AST 21 10/13/2013 0910   ALT 23 10/31/2014 0854   ALT 23 10/13/2013 0910   BILITOT 0.5 10/31/2014 0854   BILITOT 0.42 10/13/2013 0910     Fasting glucose 104 (last year was 105).  Lab Results  Component Value Date   HGBA1C 6.0 11/01/2014    ASSESSMENT/PLAN:  Medicare annual wellness visit, subsequent  Immunization due - Plan: Tdap vaccine greater than or equal to 7yo IM, Pneumococcal conjugate vaccine 13-valent  Essential hypertension, benign - controlled  Pure hypercholesterolemia - controlled  Multinodular goiter - stable  Carotid stenosis, bilateral - consider repeat u/s at f/u (2 years will be September)  Osteopenia - continue Ca, Vit D and weight bearing exercise.  Recheck DEXA 11/2014 - Plan: DG Bone Density  Impaired fasting glucose - reviewed diet, exercise.  repeat in 6 mos - Plan: HgB A1c  Constipation, unspecified constipation type - fiber, fluids, stool  softener  Gastroesophageal reflux disease, esophagitis presence not specified - change nexium to ac-dinner.  use BID prn, vs changing to Rx strength  Advance care planning - Full Code, Full Care.  MOST form reviewed and updated.  Has Living Will and POA  Encounter for routine gynecological examination   Discussed monthly self breast exams and yearly mammograms; at least 30 minutes of aerobic activity at least 5 days/week and weight-bearing exercise 2x/week; proper sunscreen use reviewed; healthy diet, including goals of calcium and vitamin D intake and alcohol recommendations (less than or equal to 1 drink/day) reviewed; regular seatbelt use; changing batteries in smoke detectors.  Immunization recommendations discussed.  Colonoscopy recommendations reviewed, due next year. hemasure kit given--not to do now, but after bleeding has stopped.  Tdap today--aware that Medicare doesn't pay for it. Prevnar today  Constipation--high fiber diet, drink plenty of water, continue regular exercise.  Try taking colace (docusate sodium) once daily (and back off if stools get too soft).  GERD:  Change your Nexium to taking it prior to dinner.  If you still have symptoms despite this change, you can either start taking it twice daily, or call for the high dose prescription Nexium.  Osteopenia--due for DEXA 11/2014 Impaired fasting glucose--check a1c. Diet/exercise reviewed  MOST form reviewed and updated.    Medicare Attestation I have personally reviewed: The patient's medical and social history Their use of alcohol, tobacco or illicit drugs Their current medications and supplements The patient's functional ability including ADLs,fall risks, home safety risks, cognitive, and hearing and visual impairment Diet and physical activities Evidence for depression or mood disorders  The patient's weight, height, BMI, and visual acuity have been recorded in the chart.  I have made referrals, counseling, and  provided education to the patient based on review of the above and I have provided the patient with a written personalized care plan for preventive services.     Granvel Proudfoot A, MD   11/01/2014

## 2014-11-02 ENCOUNTER — Encounter: Payer: Self-pay | Admitting: Family Medicine

## 2014-11-04 ENCOUNTER — Other Ambulatory Visit: Payer: Self-pay | Admitting: Family Medicine

## 2014-11-13 ENCOUNTER — Other Ambulatory Visit: Payer: Medicare Other

## 2014-11-13 DIAGNOSIS — Z1211 Encounter for screening for malignant neoplasm of colon: Secondary | ICD-10-CM

## 2014-11-14 LAB — FECAL OCCULT BLOOD, IMMUNOCHEMICAL: Fecal Occult Blood: NEGATIVE

## 2014-11-17 DIAGNOSIS — R350 Frequency of micturition: Secondary | ICD-10-CM | POA: Diagnosis not present

## 2014-11-17 DIAGNOSIS — N3942 Incontinence without sensory awareness: Secondary | ICD-10-CM | POA: Diagnosis not present

## 2014-11-17 DIAGNOSIS — N393 Stress incontinence (female) (male): Secondary | ICD-10-CM | POA: Diagnosis not present

## 2014-11-20 ENCOUNTER — Ambulatory Visit (HOSPITAL_COMMUNITY): Payer: BLUE CROSS/BLUE SHIELD

## 2014-11-20 ENCOUNTER — Ambulatory Visit (HOSPITAL_COMMUNITY)
Admission: RE | Admit: 2014-11-20 | Discharge: 2014-11-20 | Disposition: A | Discharge status: Home / Self Care | Payer: Medicare Other | Source: Ambulatory Visit | Attending: Family Medicine | Admitting: Family Medicine

## 2014-11-20 DIAGNOSIS — N3942 Incontinence without sensory awareness: Secondary | ICD-10-CM | POA: Diagnosis not present

## 2014-11-20 DIAGNOSIS — R350 Frequency of micturition: Secondary | ICD-10-CM | POA: Diagnosis not present

## 2014-11-20 DIAGNOSIS — N393 Stress incontinence (female) (male): Secondary | ICD-10-CM | POA: Diagnosis not present

## 2014-11-20 DIAGNOSIS — Z1231 Encounter for screening mammogram for malignant neoplasm of breast: Secondary | ICD-10-CM

## 2014-11-22 ENCOUNTER — Other Ambulatory Visit: Payer: Self-pay | Admitting: Urology

## 2014-11-23 ENCOUNTER — Other Ambulatory Visit: Payer: Self-pay | Admitting: Family Medicine

## 2014-11-23 DIAGNOSIS — N644 Mastodynia: Secondary | ICD-10-CM

## 2014-11-28 ENCOUNTER — Ambulatory Visit
Admission: RE | Admit: 2014-11-28 | Discharge: 2014-11-28 | Disposition: A | Discharge status: Home / Self Care | Payer: Medicare Other | Source: Ambulatory Visit | Attending: Family Medicine | Admitting: Family Medicine

## 2014-11-28 DIAGNOSIS — N644 Mastodynia: Secondary | ICD-10-CM

## 2014-11-29 ENCOUNTER — Other Ambulatory Visit: Payer: BLUE CROSS/BLUE SHIELD

## 2014-12-04 ENCOUNTER — Encounter: Payer: Self-pay | Admitting: Family Medicine

## 2014-12-07 ENCOUNTER — Encounter (HOSPITAL_BASED_OUTPATIENT_CLINIC_OR_DEPARTMENT_OTHER): Payer: Self-pay | Admitting: *Deleted

## 2014-12-08 ENCOUNTER — Encounter (HOSPITAL_BASED_OUTPATIENT_CLINIC_OR_DEPARTMENT_OTHER): Payer: Self-pay | Admitting: *Deleted

## 2014-12-08 NOTE — Progress Notes (Signed)
NPO AFTER MN.  ARRIVE AT [id number].  NEEDS ISTAT.  CURRENT EKG IN CHART AND EPIC.

## 2014-12-11 NOTE — H&P (Signed)
History of Present Illness   [name] says her incontinence has worsened. She said she is taking oxybutynin ER 20 mg. She says she can void and then go to [employer] and leak a lot. It happens a lot when she is shopping. She leaks with coughing, sneezing, bending, and lifting. She does not report urge incontinence. She does feel when she is leaking. She is wearing 5-6 pads a day moderately wet to soaken.    When I saw [name] last time, I had noted she had urethral reconstruction by Dr Dorothy Rojas. She has an overactive bladder with mild outlet abnormality. She has had a cystocele repair using a midline incision as opposed to my usual T-shaped incision. She did very well on PTNS, but I had to stop it because of Medicare. Myrbetriq had helped but her insurance only paid for oxybutynin and Detrol and trospium. She initially responded to Poca and then failed. She has a high-grade 2 cystocele markedly improved. I thought injectables were something to consider in the future. She actually was on Myrbetriq and oxybutynin at one time. This had failed. She was emptying well last time.   I reviewed [name]' notes from 2007 from Dr Dorothy Rojas. She had a cadaveric fascial sling in 2000. She had an eroded urethra. She had urethral lysis with removal of the eroded sling from the urethra and repair of the urethral erosion and suprapubic tube and vaginal rotation flap at that time. She was also having an A&P repair. She was actually in retention.   [name] is here to discuss her urodynamics. She emptied efficiently. Her maximum capacity is 600 mL. Bladder was stable. At 275 mL, her Valsalva leak-point pressure was 86 cmH2O. At 450 mL, it was 60 cmH2O and it was mild. During voluntary voiding, she voided 450 mL with a maximum flow of 9 mL/sec. Maximum voiding pressure was 8 cmH2O. She had 150 mL in her bladder. Bladder neck descended approximately 2 cm. She had a mild filling defect at the bladder neck, especially to  the right of the midline noted. Bladder was mildly trabeculated. She is still on oxybutynin. The details of the urodynamics are signed and dictated on the urodynamic sheet.   Frequency is stable.    Past Medical History Problems  1. History of hypercholesterolemia (Z86.39) 2. History of hypertension (Z86.79) 3. History of Murmur (R01.1)  Surgical History Problems  1. History of Anterior Colporrhaphy, Repair Of Cystocele 2. History of Elbow Surgery 3. History of Hysterectomy  Current Meds 1. Aspirin 81 MG Oral Tablet Chewable;  Therapy: (Recorded:04Feb2016) to Recorded 2. Calcium-D TABS;  Therapy: (Recorded:12Dec2007) to Recorded 3. Ciprofloxacin HCl - 250 MG Oral Tablet; Take 1 tablet twice daily;  Therapy: 10FBP1025 to (Evaluate:18Feb2016)  Requested for: [id number]; Last  Rx:11Feb2016 Ordered 4. Lisinopril-Hydrochlorothiazide TABS;  Therapy: (Recorded:03Sep2008) to Recorded 5. Oxybutynin Chloride ER 10 MG Oral Tablet Extended Release 24 Hour; Take 1 tablet  twice daily;  Therapy: 20Feb2015 to (Evaluate:30Jun2016)  Requested for: [phone]; Last  Rx:06Jul2015 Ordered 6. Simvastatin 40 MG Oral Tablet;  Therapy: (Recorded:19Apr2011) to Recorded 7. Vitamin E TABS;  Therapy: (Recorded:04Feb2016) to Recorded  Allergies Medication  1. Naproxen TABS  Family History Problems  1. Family history of Acute Myocardial Infarction : Father 2. Family history of Cirrhosis : Mother 3. Family history of Death In The Family Father   Deceased at age 110; MI 74. Family history of Death In The Family Mother   Deceased at age 39; Cirrhosis 5. Family  history of Family Health Status Number Of Children   2 sons and 1 daughter  Social History Problems  1. Activities Of Daily Living 2. Alcohol Use   2x/week 3. Denied: Caffeine Use 4. Exercise Habits   She indicates she is exercising 5 or 6 times a day utilizing cardio, yoga and stretching     techniques. 5. Former smoker  [phone]) 6. Living Independently With Spouse 7. Occupation:   bookkeeper 8. Self-reliant In Usual Daily Activities 9. History of Tobacco Use   smoked 1ppd for 21 yrs & quit about 1985  Assessment Assessed  1. Urinary incontinence without sensory awareness (N39.42) 2. Female stress incontinence (N39.3) 3. Urinary frequency (R35.0)  Plan Female stress incontinence  1. Follow-up Schedule Surgery Office  Follow-up  Status: Complete  Done: 11Apr2016  Discussion/Summary   It does not surprise me that [name] has a mild to moderate outlet abnormality. I think the filling defect noted is likely from her previous surgery. She had a normal cystoscopy in February 2016, except for a few white flecks in her urine. She has been a responder to PTNS and antimuscarinics and beta-3 agonist in the past. She did have a short urethra and a negative cough test after cystoscopy with a mild cystocele reduced in February 2016.   [name] understands she has a moderate outlet abnormality, but also an overactive bladder. I mentioned Interstim and Botox, but I went over urethral injectables in detail.   We talked about urethral injectables in detail. Pros, cons, general surgical and anesthetic risks, and other options including behavioral therapy and watchful waiting were discussed. She understands that injectables are generally successful in 40-70% of cases for stress incontinence, less than 50% for urge incontinence, and that in a small percentage of cases the incontinence can worsen. Risks were described but not limited to the risk of persistent, de novo, or worsening incontinence and flow symptoms were discussed. The need for long-term CIC is possible but rare. We also talked about the risk of erosion into bladder, urethra, and/or vagina with sequelae. Bleeding, infection, pain, dysuria, dyspareunia, and urethral irritation risks were discussed. Retreatment rates were discussed. The patient understands that  she might not reach her treatment goal and that she might be worse following surgery.  I would cystoscope her under anesthesia and again to make sure there is nothing at her bladder neck causing a filling defect. She does have a short urethra. I think the success rate is 60-70%. I think she has good expectations. Retreatment rate was discussed.  After a thorough review of the management options for the patient's condition the patient  elected to proceed with surgical therapy as noted above. We have discussed the potential benefits and risks of the procedure, side effects of the proposed treatment, the likelihood of the patient achieving the goals of the procedure, and any potential problems that might occur during the procedure or recuperation. Informed consent has been obtained.

## 2014-12-12 ENCOUNTER — Ambulatory Visit (HOSPITAL_BASED_OUTPATIENT_CLINIC_OR_DEPARTMENT_OTHER)
Admission: RE | Admit: 2014-12-12 | Discharge: 2014-12-12 | Disposition: A | Discharge status: Home / Self Care | Payer: Medicare Other | Source: Ambulatory Visit | Attending: Urology | Admitting: Urology

## 2014-12-12 ENCOUNTER — Ambulatory Visit (HOSPITAL_BASED_OUTPATIENT_CLINIC_OR_DEPARTMENT_OTHER): Payer: Medicare Other | Admitting: Anesthesiology

## 2014-12-12 ENCOUNTER — Encounter (HOSPITAL_BASED_OUTPATIENT_CLINIC_OR_DEPARTMENT_OTHER)
Admission: RE | Disposition: A | Discharge status: Home / Self Care | Payer: Self-pay | Source: Ambulatory Visit | Attending: Urology

## 2014-12-12 ENCOUNTER — Encounter (HOSPITAL_BASED_OUTPATIENT_CLINIC_OR_DEPARTMENT_OTHER): Payer: Self-pay | Admitting: *Deleted

## 2014-12-12 DIAGNOSIS — Z87891 Personal history of nicotine dependence: Secondary | ICD-10-CM | POA: Insufficient documentation

## 2014-12-12 DIAGNOSIS — Z79899 Other long term (current) drug therapy: Secondary | ICD-10-CM | POA: Insufficient documentation

## 2014-12-12 DIAGNOSIS — E780 Pure hypercholesterolemia: Secondary | ICD-10-CM | POA: Diagnosis not present

## 2014-12-12 DIAGNOSIS — N3281 Overactive bladder: Secondary | ICD-10-CM | POA: Insufficient documentation

## 2014-12-12 DIAGNOSIS — Z8249 Family history of ischemic heart disease and other diseases of the circulatory system: Secondary | ICD-10-CM | POA: Diagnosis not present

## 2014-12-12 DIAGNOSIS — I10 Essential (primary) hypertension: Secondary | ICD-10-CM | POA: Diagnosis not present

## 2014-12-12 DIAGNOSIS — D6851 Activated protein C resistance: Secondary | ICD-10-CM | POA: Insufficient documentation

## 2014-12-12 DIAGNOSIS — I739 Peripheral vascular disease, unspecified: Secondary | ICD-10-CM | POA: Insufficient documentation

## 2014-12-12 DIAGNOSIS — N3942 Incontinence without sensory awareness: Secondary | ICD-10-CM | POA: Diagnosis not present

## 2014-12-12 DIAGNOSIS — Z86718 Personal history of other venous thrombosis and embolism: Secondary | ICD-10-CM | POA: Diagnosis not present

## 2014-12-12 DIAGNOSIS — Z7982 Long term (current) use of aspirin: Secondary | ICD-10-CM | POA: Insufficient documentation

## 2014-12-12 DIAGNOSIS — N3642 Intrinsic sphincter deficiency (ISD): Secondary | ICD-10-CM | POA: Diagnosis not present

## 2014-12-12 DIAGNOSIS — M1990 Unspecified osteoarthritis, unspecified site: Secondary | ICD-10-CM | POA: Insufficient documentation

## 2014-12-12 DIAGNOSIS — Z886 Allergy status to analgesic agent status: Secondary | ICD-10-CM | POA: Diagnosis not present

## 2014-12-12 DIAGNOSIS — N393 Stress incontinence (female) (male): Secondary | ICD-10-CM | POA: Insufficient documentation

## 2014-12-12 DIAGNOSIS — K219 Gastro-esophageal reflux disease without esophagitis: Secondary | ICD-10-CM | POA: Diagnosis not present

## 2014-12-12 DIAGNOSIS — R32 Unspecified urinary incontinence: Secondary | ICD-10-CM | POA: Diagnosis present

## 2014-12-12 HISTORY — DX: Unspecified osteoarthritis, unspecified site: M19.90

## 2014-12-12 HISTORY — DX: Other constipation: K59.09

## 2014-12-12 HISTORY — DX: Gastro-esophageal reflux disease without esophagitis: K21.9

## 2014-12-12 HISTORY — DX: Prediabetes: R73.03

## 2014-12-12 HISTORY — DX: Stress incontinence (female) (male): N39.3

## 2014-12-12 HISTORY — DX: Personal history of other venous thrombosis and embolism: Z86.718

## 2014-12-12 HISTORY — PX: CYSTOSCOPY MACROPLASTIQUE IMPLANT: SHX6636

## 2014-12-12 HISTORY — DX: Localized enlarged lymph nodes: R59.0

## 2014-12-12 LAB — POCT I-STAT, CHEM 8
BUN: 19 mg/dL (ref 6–20)
BUN: 16 mg/dL (ref 6–20)
CALCIUM ION: 1.27 mmol/L (ref 1.13–1.30)
CHLORIDE: 100 mmol/L — AB (ref 101–111)
CHLORIDE: 100 mmol/L — AB (ref 101–111)
Calcium, Ion: 1.28 mmol/L (ref 1.13–1.30)
Creatinine, Ser: 0.7 mg/dL (ref 0.44–1.00)
Creatinine, Ser: 0.7 mg/dL (ref 0.44–1.00)
Glucose, Bld: 109 mg/dL — ABNORMAL HIGH (ref 70–99)
Glucose, Bld: 98 mg/dL (ref 70–99)
HCT: 34 % — ABNORMAL LOW (ref 36.0–46.0)
HEMATOCRIT: 57 % — AB (ref 36.0–46.0)
Hemoglobin: 19.4 g/dL — ABNORMAL HIGH (ref 12.0–15.0)
Hemoglobin: 11.6 g/dL — ABNORMAL LOW (ref 12.0–15.0)
POTASSIUM: 3.8 mmol/L (ref 3.5–5.1)
POTASSIUM: 3.9 mmol/L (ref 3.5–5.1)
SODIUM: 143 mmol/L (ref 135–145)
Sodium: 142 mmol/L (ref 135–145)
TCO2: 28 mmol/L (ref 0–100)
TCO2: 27 mmol/L (ref 0–100)

## 2014-12-12 SURGERY — CYSTOSCOPY, WITH MACROPLASTIQUE INJECTION
Anesthesia: General | Site: Bladder

## 2014-12-12 MED ORDER — MIDAZOLAM HCL 5 MG/5ML IJ SOLN
INTRAMUSCULAR | Status: DC | PRN
  Administered 2014-12-12: 2 mg via INTRAVENOUS

## 2014-12-12 MED ORDER — STERILE WATER FOR IRRIGATION IR SOLN
Status: DC | PRN
  Administered 2014-12-12: 6000 mL

## 2014-12-12 MED ORDER — FENTANYL CITRATE (PF) 100 MCG/2ML IJ SOLN
INTRAMUSCULAR | Status: AC
  Filled 2014-12-12: qty 4

## 2014-12-12 MED ORDER — CIPROFLOXACIN IN D5W 400 MG/200ML IV SOLN
400.0000 mg | INTRAVENOUS | Status: AC
  Administered 2014-12-12: 400 mg via INTRAVENOUS
  Filled 2014-12-12: qty 200

## 2014-12-12 MED ORDER — DEXAMETHASONE SODIUM PHOSPHATE 4 MG/ML IJ SOLN
INTRAMUSCULAR | Status: DC | PRN
  Administered 2014-12-12: 10 mg via INTRAVENOUS

## 2014-12-12 MED ORDER — ONDANSETRON HCL 4 MG/2ML IJ SOLN
INTRAMUSCULAR | Status: DC | PRN
  Administered 2014-12-12: 4 mg via INTRAVENOUS

## 2014-12-12 MED ORDER — ACETAMINOPHEN 10 MG/ML IV SOLN
INTRAVENOUS | Status: DC | PRN
  Administered 2014-12-12: 1000 mg via INTRAVENOUS

## 2014-12-12 MED ORDER — CIPROFLOXACIN IN D5W 400 MG/200ML IV SOLN
INTRAVENOUS | Status: AC
  Filled 2014-12-12: qty 200

## 2014-12-12 MED ORDER — FENTANYL CITRATE (PF) 100 MCG/2ML IJ SOLN
INTRAMUSCULAR | Status: DC | PRN
  Administered 2014-12-12: 50 ug via INTRAVENOUS

## 2014-12-12 MED ORDER — MIDAZOLAM HCL 2 MG/2ML IJ SOLN
INTRAMUSCULAR | Status: AC
  Filled 2014-12-12: qty 2

## 2014-12-12 MED ORDER — PROPOFOL 10 MG/ML IV BOLUS
INTRAVENOUS | Status: DC | PRN
  Administered 2014-12-12: 160 mg via INTRAVENOUS

## 2014-12-12 MED ORDER — LACTATED RINGERS IV SOLN
INTRAVENOUS | Status: DC
  Administered 2014-12-12: 09:00:00 via INTRAVENOUS
  Filled 2014-12-12: qty 1000

## 2014-12-12 MED ORDER — CIPROFLOXACIN HCL 250 MG PO TABS: 250.0000 mg | ORAL_TABLET | Freq: Two times a day (BID) | ORAL | Status: DC

## 2014-12-12 MED ORDER — LIDOCAINE HCL (CARDIAC) 20 MG/ML IV SOLN
INTRAVENOUS | Status: DC | PRN
  Administered 2014-12-12: 80 mg via INTRAVENOUS

## 2014-12-12 SURGICAL SUPPLY — 29 items
BAG DRAIN URO-CYSTO SKYTR STRL (DRAIN) ×3 IMPLANT
BAG DRN UROCATH (DRAIN) ×1
CANISTER SUCT LVC 12 LTR MEDI- (MISCELLANEOUS) ×2 IMPLANT
CATH ROBINSON RED A/P 12FR (CATHETERS) IMPLANT
CATH ROBINSON RED A/P 14FR (CATHETERS) ×3 IMPLANT
CLOTH BEACON ORANGE TIMEOUT ST (SAFETY) ×3 IMPLANT
ELECT REM PT RETURN 9FT ADLT (ELECTROSURGICAL)
ELECTRODE REM PT RTRN 9FT ADLT (ELECTROSURGICAL) IMPLANT
GLOVE BIO SURGEON STRL SZ7.5 (GLOVE) ×3 IMPLANT
GLOVE BIOGEL PI IND STRL 7.0 (GLOVE) IMPLANT
GLOVE BIOGEL PI IND STRL 7.5 (GLOVE) IMPLANT
GLOVE BIOGEL PI INDICATOR 7.0 (GLOVE) ×2
GLOVE BIOGEL PI INDICATOR 7.5 (GLOVE) ×6
GLOVE SKINSENSE NS SZ7.0 (GLOVE) ×2
GLOVE SKINSENSE STRL SZ7.0 (GLOVE) IMPLANT
GOWN STRL REUS W/ TWL LRG LVL3 (GOWN DISPOSABLE) ×1 IMPLANT
GOWN STRL REUS W/ TWL XL LVL3 (GOWN DISPOSABLE) ×1 IMPLANT
GOWN STRL REUS W/TWL LRG LVL3 (GOWN DISPOSABLE) ×3
GOWN STRL REUS W/TWL XL LVL3 (GOWN DISPOSABLE) ×6
INJECTION MACROPLASIQ 2.5ML UN (Female Continence) IMPLANT
MACROPLASTIQUE 2.5ML UNIT (Female Continence) ×6 IMPLANT
NDL RIGID UROPLASTY (NEEDLE) IMPLANT
NDL SAFETY ECLIPSE 18X1.5 (NEEDLE) ×1 IMPLANT
NEEDLE HYPO 18GX1.5 SHARP (NEEDLE) ×3
NEEDLE RIGID UROPLASTY (NEEDLE) ×3 IMPLANT
PACK CYSTO (CUSTOM PROCEDURE TRAY) ×3 IMPLANT
SYR 20CC LL (SYRINGE) ×3 IMPLANT
SYR BULB IRRIGATION 50ML (SYRINGE) IMPLANT
WATER STERILE IRR 3000ML UROMA (IV SOLUTION) ×5 IMPLANT

## 2014-12-12 NOTE — Op Note (Signed)
Preoperative diagnosis: Intrinsic sphincter deficiency and stress urinary incontinence Postoperative diagnosis: Intrinsic sphincter deficiency and stress urinary incontinence Surgery: Cystoscopy and injection of Macroplastique Surgeon: Dr. Nicki Reaper Ambrose Wile Assistant: Dr. Curt Bears  The patient has the above diagnoses and consented above procedure. 60 [place] scope was utilized. Bladder mucosa and trigone were normal. There is no abnormality at the bladder neck to explain the filling defect during urodynamics. I took a picture and she appeared to have a tissue bridge between 2 and 5:00 on the left likely from Dr. Pollyann Samples previous reconstruction.  She had a mildly short straight urethra that was wide open at rest.  Utilizing the described technique I injected at 56 and 8:00 with the Macroplastique delivery system. I had to enter in the distal third of urethra since it was short. I was happy with the 3 blebs. Bladder was emptied with red rubber catheter throughout the case. I was happy with the technical success and hopefully reach her treatment goal

## 2014-12-12 NOTE — Transfer of Care (Signed)
Immediate Anesthesia Transfer of Care Note  Patient: Dorothy Rojas  Procedure(s) Performed: Procedure(s): CYSTOSCOPY MACROPLASTIQUE INJECTION (N/A)  Patient Location: PACU  Anesthesia Type:General  Level of Consciousness: awake, alert , oriented and patient cooperative  Airway & Oxygen Therapy: Patient Spontanous Breathing and Patient connected to nasal cannula oxygen  Post-op Assessment: Report given to RN and Post -op Vital signs reviewed and stable  Post vital signs: Reviewed and stable  Last Vitals:  Filed Vitals:   12/12/14 0815  BP: 130/57  Pulse: 58  Temp: 37.1 C  Resp: 16    Complications: No apparent anesthesia complications

## 2014-12-12 NOTE — Discharge Instructions (Signed)
I have reviewed discharge instructions in detail with the patient. They will follow-up with me or their physician as scheduled. My nurse will also be calling the patients as per protocol.    Post Anesthesia Home Care Instructions  Activity: Get plenty of rest for the remainder of the day. A responsible adult should stay with you for 24 hours following the procedure.  For the next 24 hours, DO NOT: -Drive a car -Paediatric nurse -Drink alcoholic beverages -Take any medication unless instructed by your physician -Make any legal decisions or sign important papers.  Meals: Start with liquid foods such as gelatin or soup. Progress to regular foods as tolerated. Avoid greasy, spicy, heavy foods. If nausea and/or vomiting occur, drink only clear liquids until the nausea and/or vomiting subsides. Call your physician if vomiting continues.  Special Instructions/Symptoms: Your throat may feel dry or sore from the anesthesia or the breathing tube placed in your throat during surgery. If this causes discomfort, gargle with warm salt water. The discomfort should disappear within 24 hours.  If you had a scopolamine patch placed behind your ear for the management of post- operative nausea and/or vomiting:  1. The medication in the patch is effective for 72 hours, after which it should be removed.  Wrap patch in a tissue and discard in the trash. Wash hands thoroughly with soap and water. 2. You may remove the patch earlier than 72 hours if you experience unpleasant side effects which may include dry mouth, dizziness or visual disturbances. 3. Avoid touching the patch. Wash your hands with soap and water after contact with the patch.   CYSTOSCOPY HOME CARE INSTRUCTIONS  Activity: Rest for the remainder of the day.  Do not drive or operate equipment today.  You may resume normal activities in one to two days as instructed by your physician.   Meals: Drink plenty of liquids and eat light foods such as  gelatin or soup this evening.  You may return to a normal meal plan tomorrow.  Return to Work: You may return to work in one to two days or as instructed by your physician.  Special Instructions / Symptoms: Call your physician if any of these symptoms occur:   -persistent or heavy bleeding  -bleeding which continues after first few urination  -large blood clots that are difficult to pass  -urine stream diminishes or stops completely  -fever equal to or higher than 101 degrees Farenheit.  -cloudy urine with a strong, foul odor  -severe pain  Females should always wipe from front to back after elimination.  You may feel some burning pain when you urinate.  This should disappear with time.  Applying moist heat to the lower abdomen or a hot tub bath may help relieve the pain. \  Follow-Up / Date of Return Visit to Your Physician:   Call for an appointment to arrange follow-up.  Patient Signature:  ________________________________________________________  Nurse's Signature:  ________________________________________________________

## 2014-12-12 NOTE — Anesthesia Procedure Notes (Signed)
Procedure Name: LMA Insertion Date/Time: 12/12/2014 9:54 AM Performed by: Wanita Chamberlain Pre-anesthesia Checklist: Patient identified, Timeout performed, Emergency Drugs available, Suction available and Patient being monitored Patient Re-evaluated:Patient Re-evaluated prior to inductionOxygen Delivery Method: Circle system utilized Preoxygenation: Pre-oxygenation with 100% oxygen Intubation Type: IV induction Ventilation: Mask ventilation without difficulty LMA: LMA inserted LMA Size: 4.0 Number of attempts: 1 Placement Confirmation: positive ETCO2 Tube secured with: Tape Dental Injury: Teeth and Oropharynx as per pre-operative assessment

## 2014-12-12 NOTE — Anesthesia Postprocedure Evaluation (Signed)
Anesthesia Post Note  Patient: Dorothy Rojas  Procedure(s) Performed: Procedure(s) (LRB): CYSTOSCOPY MACROPLASTIQUE INJECTION (N/A)  Anesthesia type: general  Patient location: PACU  Post pain: Pain level controlled  Post assessment: Patient's Cardiovascular Status Stable  Last Vitals:  Filed Vitals:   12/12/14 1149  BP: 153/58  Pulse: 58  Temp: 36.4 C  Resp: 16    Post vital signs: Reviewed and stable  Level of consciousness: sedated  Complications: No apparent anesthesia complications

## 2014-12-12 NOTE — Anesthesia Preprocedure Evaluation (Addendum)
Anesthesia Evaluation  Patient identified by MRN, date of birth, ID band Patient awake    Reviewed: Allergy & Precautions, NPO status , Patient's Chart, lab work & pertinent test results  Airway Mallampati: I  TM Distance: >3 FB Neck ROM: Full    Dental  (+) Teeth Intact, Dental Advisory Given   Pulmonary former smoker,    Pulmonary exam normal       Cardiovascular Exercise Tolerance: Good hypertension, Pt. on medications + Peripheral Vascular Disease Rhythm:Regular Rate:Normal     Neuro/Psych    GI/Hepatic Neg liver ROS, GERD-  Medicated and Controlled,  Endo/Other  negative endocrine ROS  Renal/GU negative Renal ROS  Female GU complaint Incontinence s/p Anter. Colporrhapy and excision of cystocele    Musculoskeletal  (+) Arthritis -, Osteoarthritis,    Abdominal   Peds  Hematology Factor 5 Leiden H/O DVT treated with Xaralta for 6 months, then baby Asprin a day   Anesthesia Other Findings   Reproductive/Obstetrics negative OB ROS                          Anesthesia Physical Anesthesia Plan  ASA: II  Anesthesia Plan: General   Post-op Pain Management:    Induction: Intravenous  Airway Management Planned: LMA  Additional Equipment:   Intra-op Plan:   Post-operative Plan: Extubation in OR  Informed Consent: I have reviewed the patients History and Physical, chart, labs and discussed the procedure including the risks, benefits and alternatives for the proposed anesthesia with the patient or authorized representative who has indicated his/her understanding and acceptance.   Dental advisory given  Plan Discussed with: CRNA, Surgeon and Anesthesiologist  Anesthesia Plan Comments:        Anesthesia Quick Evaluation

## 2014-12-12 NOTE — Interval H&P Note (Signed)
History and Physical Interval Note:  12/12/2014 7:16 AM  Dorothy Rojas  has presented today for surgery, with the diagnosis of stress incontinence  The various methods of treatment have been discussed with the patient and family. After consideration of risks, benefits and other options for treatment, the patient has consented to  Procedure(s): CYSTOSCOPY MACROPLASTIQUE INJECTION (N/A) as a surgical intervention .  The patient's history has been reviewed, patient examined, no change in status, stable for surgery.  I have reviewed the patient's chart and labs.  Questions were answered to the patient's satisfaction.     Damarious Holtsclaw A

## 2014-12-13 ENCOUNTER — Encounter (HOSPITAL_BASED_OUTPATIENT_CLINIC_OR_DEPARTMENT_OTHER): Payer: Self-pay | Admitting: Urology

## 2014-12-13 DIAGNOSIS — M1711 Unilateral primary osteoarthritis, right knee: Secondary | ICD-10-CM | POA: Diagnosis not present

## 2014-12-13 DIAGNOSIS — M1712 Unilateral primary osteoarthritis, left knee: Secondary | ICD-10-CM | POA: Diagnosis not present

## 2014-12-27 DIAGNOSIS — N393 Stress incontinence (female) (male): Secondary | ICD-10-CM | POA: Diagnosis not present

## 2015-01-10 DIAGNOSIS — R300 Dysuria: Secondary | ICD-10-CM | POA: Diagnosis not present

## 2015-01-10 DIAGNOSIS — N390 Urinary tract infection, site not specified: Secondary | ICD-10-CM | POA: Diagnosis not present

## 2015-02-07 ENCOUNTER — Encounter: Payer: Self-pay | Admitting: Family Medicine

## 2015-02-14 ENCOUNTER — Ambulatory Visit
Admission: RE | Admit: 2015-02-14 | Discharge: 2015-02-14 | Disposition: A | Discharge status: Home / Self Care | Payer: Medicare Other | Source: Ambulatory Visit | Attending: Family Medicine | Admitting: Family Medicine

## 2015-02-14 ENCOUNTER — Encounter: Payer: Self-pay | Admitting: Family Medicine

## 2015-02-14 ENCOUNTER — Ambulatory Visit (INDEPENDENT_AMBULATORY_CARE_PROVIDER_SITE_OTHER): Payer: Medicare Other | Admitting: Family Medicine

## 2015-02-14 VITALS — BP 118/68 | HR 64 | Temp 97.6°F | Wt 120.2 lb

## 2015-02-14 DIAGNOSIS — N3281 Overactive bladder: Secondary | ICD-10-CM

## 2015-02-14 DIAGNOSIS — I6523 Occlusion and stenosis of bilateral carotid arteries: Secondary | ICD-10-CM | POA: Diagnosis not present

## 2015-02-14 DIAGNOSIS — R05 Cough: Secondary | ICD-10-CM | POA: Diagnosis not present

## 2015-02-14 DIAGNOSIS — E042 Nontoxic multinodular goiter: Secondary | ICD-10-CM | POA: Diagnosis not present

## 2015-02-14 DIAGNOSIS — I10 Essential (primary) hypertension: Secondary | ICD-10-CM

## 2015-02-14 DIAGNOSIS — R634 Abnormal weight loss: Secondary | ICD-10-CM | POA: Diagnosis not present

## 2015-02-14 DIAGNOSIS — Z87891 Personal history of nicotine dependence: Secondary | ICD-10-CM

## 2015-02-14 DIAGNOSIS — D649 Anemia, unspecified: Secondary | ICD-10-CM

## 2015-02-14 DIAGNOSIS — E041 Nontoxic single thyroid nodule: Secondary | ICD-10-CM | POA: Diagnosis not present

## 2015-02-14 DIAGNOSIS — Z5181 Encounter for therapeutic drug level monitoring: Secondary | ICD-10-CM | POA: Diagnosis not present

## 2015-02-14 DIAGNOSIS — R7301 Impaired fasting glucose: Secondary | ICD-10-CM

## 2015-02-14 LAB — FERRITIN: FERRITIN: 101 ng/mL (ref 10–291)

## 2015-02-14 LAB — CBC WITH DIFFERENTIAL/PLATELET
Basophils Absolute: 0 10*3/uL (ref 0.0–0.1)
Basophils Relative: 0 % (ref 0–1)
Eosinophils Absolute: 0.1 10*3/uL (ref 0.0–0.7)
Eosinophils Relative: 2 % (ref 0–5)
HEMATOCRIT: 34.7 % — AB (ref 36.0–46.0)
HEMOGLOBIN: 11.7 g/dL — AB (ref 12.0–15.0)
LYMPHS ABS: 2.4 10*3/uL (ref 0.7–4.0)
Lymphocytes Relative: 35 % (ref 12–46)
MCH: 32.1 pg (ref 26.0–34.0)
MCHC: 33.7 g/dL (ref 30.0–36.0)
MCV: 95.3 fL (ref 78.0–100.0)
MPV: 10.5 fL (ref 8.6–12.4)
Monocytes Absolute: 0.7 10*3/uL (ref 0.1–1.0)
Monocytes Relative: 10 % (ref 3–12)
NEUTROS PCT: 53 % (ref 43–77)
Neutro Abs: 3.6 10*3/uL (ref 1.7–7.7)
Platelets: 206 10*3/uL (ref 150–400)
RBC: 3.64 MIL/uL — ABNORMAL LOW (ref 3.87–5.11)
RDW: 14.5 % (ref 11.5–15.5)
WBC: 6.8 10*3/uL (ref 4.0–10.5)

## 2015-02-14 LAB — COMPREHENSIVE METABOLIC PANEL
ALT: 17 U/L (ref 0–35)
AST: 21 U/L (ref 0–37)
Albumin: 4.2 g/dL (ref 3.5–5.2)
Alkaline Phosphatase: 56 U/L (ref 39–117)
BILIRUBIN TOTAL: 0.5 mg/dL (ref 0.2–1.2)
BUN: 18 mg/dL (ref 6–23)
CHLORIDE: 101 meq/L (ref 96–112)
CO2: 29 mEq/L (ref 19–32)
CREATININE: 0.68 mg/dL (ref 0.50–1.10)
Calcium: 9.7 mg/dL (ref 8.4–10.5)
Glucose, Bld: 102 mg/dL — ABNORMAL HIGH (ref 70–99)
Potassium: 4.1 mEq/L (ref 3.5–5.3)
Sodium: 141 mEq/L (ref 135–145)
Total Protein: 6.9 g/dL (ref 6.0–8.3)

## 2015-02-14 LAB — IRON: IRON: 76 ug/dL (ref 42–145)

## 2015-02-14 LAB — HEMOGLOBIN A1C
Hgb A1c MFr Bld: 6.1 % — ABNORMAL HIGH (ref <5.7)
Mean Plasma Glucose: 128 mg/dL — ABNORMAL HIGH (ref <117)

## 2015-02-14 LAB — TSH: TSH: 2.721 u[IU]/mL (ref 0.350–4.500)

## 2015-02-14 NOTE — Patient Instructions (Signed)
We will be in touch with your test results probably tomorrow.  Assuming that the Hemoglobin remains a little low, like it has been, and no other abnormalities are found, we will be referring you back to GI for additional evaluation.  Please go to  [facility] at your convenience for a chest x-ray. Try and ensure that you are getting adequate caloric intake so that you don't lose any further weight (your current weight is healthy)

## 2015-02-14 NOTE — Progress Notes (Signed)
Chief Complaint  Patient presents with  . weight loss    weight loss since march. no diet changed   Patient presents to discuss 12# weight loss.  She doesn't feel like there was significant change in her diet to account for this weight loss.  She cut out her afternoon snack (which used to be nuts or chex mix).  She also gave up her 2 dark chocolate kisses after lunch and dinner daily (4/day=88 calories) since getting back results of elevated A1c.  She otherwise hasn't made any other changes in diet or exercise.  Some days she feels a little fatigued, but not daily (like energy is less than normal)--often when outside in the sun/heat.  She has dry skin (mostly hands); denies hair loss; denies temperature intolerance; denies palpitations or tachycardia. She denies any change in bowel habits.  She takes Colace daily, which helps some with constipation (less straining, still doesn't go regularly).  Denies any blood or mucus in stool.  Appetite is good--doesn't completely clean the plate like she used to, but no significant early satiety.  Review of chart shows Hg 11.6 on 5/3 (done at time of cystoscopy/macroplastique injection). Hg 11.5 in 04/2014, wiwth MCV 95.4 and normal B12 and folate.  Negative hemoccult test 11/2014. Last colonocopy was 2007, recalls it was normal (with Dr. Lajoyce Corners) Denies any bleeding, bruising.  She had macroplastique injection in May, didn't have any leaking for a full month.  Early June she started with pressure and burning, saw urologist, found 2 organisms.  She wasn't treated for infection (she isn't sure)--urinary symptoms of infection resolved, but the incontinence recurred and is back to the same level as prior to the procedure.  She has f/u appt with him scheduled.  She is former smoker, quit over 30 years ago. She has some chronic tickle/throat-clearing and intermittent raspy voice.  CXR 04/2014 was normal. Heartburn occasionally.  She takes Nexium daily.  PMH, PSH, SH  reviewed.  Outpatient Encounter Prescriptions as of 02/14/2015  Medication Sig  . aspirin 81 MG tablet Take 81 mg by mouth daily.  . Calcium Carbonate-Vitamin D (CALCIUM 600+D) 600-400 MG-UNIT per tablet Take 1 tablet by mouth daily.  [name] docusate sodium (COLACE) 100 MG capsule Take 100 mg by mouth 2 (two) times daily.  [name] esomeprazole (NEXIUM) 20 MG capsule Take 20 mg by mouth every evening.  [name] lisinopril-hydrochlorothiazide (PRINZIDE,ZESTORETIC) 20-12.5 MG per tablet TAKE 1 TABLET ONCE DAILY. (Patient taking differently: Take 1 tablet by mouth every morning. TAKE 1 TABLET ONCE DAILY.)  . Omega-3 Fatty Acids (FISH OIL) 1200 MG CPDR Take 1 capsule by mouth daily.  [name] oxybutynin (DITROPAN) 5 MG tablet Take 10 mg by mouth 3 (three) times daily.  . simvastatin (ZOCOR) 40 MG tablet TAKE ONE TABLET AT BEDTIME.  . [DISCONTINUED] ciprofloxacin (CIPRO) 250 MG tablet Take 1 tablet (250 mg total) by mouth 2 (two) times daily.   No facility-administered encounter medications on file as of 02/14/2015.   Allergies  Allergen Reactions  . Naprosyn [Naproxen] Rash   ROS:  No fever, chills, URI symptoms. Some throat-clearing, intermittent hoarseness, occasional heartburn. No chest pain, palpitations, shortness of breath, nausea, vomiting, bowel changes.  See HPI.  No depression, joint pain, insomnia, bleeding, bruising, rash, or other concerns except as noted in HPI  Wt Readings from Last 3 Encounters:  02/14/15 120 lb 3.2 oz (54.522 kg)  12/12/14 126 lb (57.153 kg)  11/01/14 130 lb 3.2 oz (59.058 kg)    PHYSICAL EXAM: BP  118/68 mmHg  Pulse 64  Temp(Src) 97.6 F (36.4 C) (Tympanic)  Wt 120 lb 3.2 oz (54.522 kg)  10# weight loss noted since her visit here in March 2016. Well developed, pleasant, well-appearing female in no distress HEENT: PERRL, EOMI, conjunctiva clear.   OP clear Neck: No lymphadenopathy (cervical, supraclavicular), thyromegaly or carotid bruit Heart: regular rate and rhythm without  murmur Lungs: clear bilaterally Abdomen: soft, nontender, no organomegaly or mass Extremities: no edema, 2+ pulses Psych: normal mood, affect, hygiene and grooming Neuro: alert and oriented. Cranial nerves grossly normal. Normal strength, gait   ASSESSMENT/PLAN:  Loss of weight - Plan: DG Chest 2 View, CANCELED: CBC with Differential/Platelet, CANCELED: TSH, CANCELED: Comprehensive metabolic panel  Impaired fasting glucose - Plan: Hemoglobin A1c  Medication monitoring encounter - Plan: CANCELED: CBC with Differential/Platelet, CANCELED: TSH, CANCELED: Comprehensive metabolic panel  Multinodular goiter - stable - Plan: CANCELED: TSH  Essential hypertension, benign - controlled - Plan: CANCELED: Comprehensive metabolic panel  Anemia, unspecified anemia type - Plan: Ferritin, CANCELED: Iron  Former smoker  Significant weight loss, with only minor dietary changes.  Look for underlying cause.  She has normocytic anemia, with negative B12/folate in the past. Repeat studies. May need GI work-up (due in 2017 anyway)  Released labs from September upcoming visit, except lipids (nonfasting today) CXR

## 2015-02-15 ENCOUNTER — Encounter: Payer: Self-pay | Admitting: Internal Medicine

## 2015-02-15 NOTE — Addendum Note (Signed)
Addended by: [name] on: 02/15/2015 08:43 AM   Modules accepted: Orders

## 2015-03-05 DIAGNOSIS — N3942 Incontinence without sensory awareness: Secondary | ICD-10-CM | POA: Diagnosis not present

## 2015-03-05 DIAGNOSIS — R350 Frequency of micturition: Secondary | ICD-10-CM | POA: Diagnosis not present

## 2015-03-05 DIAGNOSIS — N393 Stress incontinence (female) (male): Secondary | ICD-10-CM | POA: Diagnosis not present

## 2015-03-05 DIAGNOSIS — N3020 Other chronic cystitis without hematuria: Secondary | ICD-10-CM | POA: Diagnosis not present

## 2015-03-09 ENCOUNTER — Other Ambulatory Visit: Payer: Self-pay | Admitting: Family Medicine

## 2015-03-13 ENCOUNTER — Other Ambulatory Visit: Payer: Self-pay | Admitting: Urology

## 2015-04-05 ENCOUNTER — Other Ambulatory Visit: Payer: Self-pay | Admitting: Family Medicine

## 2015-04-05 ENCOUNTER — Encounter (HOSPITAL_BASED_OUTPATIENT_CLINIC_OR_DEPARTMENT_OTHER): Payer: Self-pay | Admitting: *Deleted

## 2015-04-05 NOTE — Progress Notes (Signed)
NPO AFTER MN.  ARRIVE AT 1030.  NEEDS ISTAT.  WILL TAKE COLACE AND DITROPAN AM DOS W/ SIPS OF WATER.

## 2015-04-12 ENCOUNTER — Ambulatory Visit (HOSPITAL_BASED_OUTPATIENT_CLINIC_OR_DEPARTMENT_OTHER)
Admission: RE | Admit: 2015-04-12 | Discharge: 2015-04-12 | Disposition: A | Discharge status: Home / Self Care | Payer: Medicare Other | Source: Ambulatory Visit | Attending: Urology | Admitting: Urology

## 2015-04-12 ENCOUNTER — Ambulatory Visit (HOSPITAL_BASED_OUTPATIENT_CLINIC_OR_DEPARTMENT_OTHER): Payer: Medicare Other | Admitting: Anesthesiology

## 2015-04-12 ENCOUNTER — Encounter (HOSPITAL_BASED_OUTPATIENT_CLINIC_OR_DEPARTMENT_OTHER)
Admission: RE | Disposition: A | Discharge status: Home / Self Care | Payer: Self-pay | Source: Ambulatory Visit | Attending: Urology

## 2015-04-12 ENCOUNTER — Encounter (HOSPITAL_BASED_OUTPATIENT_CLINIC_OR_DEPARTMENT_OTHER): Payer: Self-pay

## 2015-04-12 DIAGNOSIS — Z86718 Personal history of other venous thrombosis and embolism: Secondary | ICD-10-CM | POA: Insufficient documentation

## 2015-04-12 DIAGNOSIS — E780 Pure hypercholesterolemia: Secondary | ICD-10-CM | POA: Insufficient documentation

## 2015-04-12 DIAGNOSIS — N8110 Cystocele, unspecified: Secondary | ICD-10-CM | POA: Insufficient documentation

## 2015-04-12 DIAGNOSIS — K219 Gastro-esophageal reflux disease without esophagitis: Secondary | ICD-10-CM | POA: Insufficient documentation

## 2015-04-12 DIAGNOSIS — N3020 Other chronic cystitis without hematuria: Secondary | ICD-10-CM | POA: Insufficient documentation

## 2015-04-12 DIAGNOSIS — N3942 Incontinence without sensory awareness: Secondary | ICD-10-CM | POA: Insufficient documentation

## 2015-04-12 DIAGNOSIS — R011 Cardiac murmur, unspecified: Secondary | ICD-10-CM | POA: Insufficient documentation

## 2015-04-12 DIAGNOSIS — Z886 Allergy status to analgesic agent status: Secondary | ICD-10-CM | POA: Insufficient documentation

## 2015-04-12 DIAGNOSIS — Z87891 Personal history of nicotine dependence: Secondary | ICD-10-CM | POA: Insufficient documentation

## 2015-04-12 DIAGNOSIS — Z7982 Long term (current) use of aspirin: Secondary | ICD-10-CM | POA: Insufficient documentation

## 2015-04-12 DIAGNOSIS — N3642 Intrinsic sphincter deficiency (ISD): Secondary | ICD-10-CM | POA: Insufficient documentation

## 2015-04-12 DIAGNOSIS — N393 Stress incontinence (female) (male): Secondary | ICD-10-CM | POA: Diagnosis not present

## 2015-04-12 DIAGNOSIS — R350 Frequency of micturition: Secondary | ICD-10-CM | POA: Insufficient documentation

## 2015-04-12 DIAGNOSIS — D6851 Activated protein C resistance: Secondary | ICD-10-CM | POA: Insufficient documentation

## 2015-04-12 DIAGNOSIS — I10 Essential (primary) hypertension: Secondary | ICD-10-CM | POA: Diagnosis not present

## 2015-04-12 HISTORY — PX: CYSTOSCOPY MACROPLASTIQUE IMPLANT: SHX6636

## 2015-04-12 LAB — POCT I-STAT 4, (NA,K, GLUC, HGB,HCT)
Glucose, Bld: 98 mg/dL (ref 65–99)
HEMATOCRIT: 34 % — AB (ref 36.0–46.0)
HEMOGLOBIN: 11.6 g/dL — AB (ref 12.0–15.0)
POTASSIUM: 3.8 mmol/L (ref 3.5–5.1)
SODIUM: 142 mmol/L (ref 135–145)

## 2015-04-12 SURGERY — CYSTOSCOPY, WITH MACROPLASTIQUE INJECTION
Anesthesia: General | Site: Bladder

## 2015-04-12 MED ORDER — LIDOCAINE HCL (CARDIAC) 20 MG/ML IV SOLN
INTRAVENOUS | Status: DC | PRN
  Administered 2015-04-12: 60 mg via INTRAVENOUS

## 2015-04-12 MED ORDER — ACETAMINOPHEN 10 MG/ML IV SOLN
INTRAVENOUS | Status: DC | PRN
  Administered 2015-04-12: 1000 mg via INTRAVENOUS

## 2015-04-12 MED ORDER — DEXAMETHASONE SODIUM PHOSPHATE 4 MG/ML IJ SOLN
INTRAMUSCULAR | Status: DC | PRN
  Administered 2015-04-12: 10 mg via INTRAVENOUS

## 2015-04-12 MED ORDER — ONDANSETRON HCL 4 MG/2ML IJ SOLN
INTRAMUSCULAR | Status: DC | PRN
  Administered 2015-04-12: 4 mg via INTRAVENOUS

## 2015-04-12 MED ORDER — PROPOFOL 10 MG/ML IV BOLUS
INTRAVENOUS | Status: DC | PRN
  Administered 2015-04-12: 150 mg via INTRAVENOUS

## 2015-04-12 MED ORDER — FENTANYL CITRATE (PF) 100 MCG/2ML IJ SOLN
INTRAMUSCULAR | Status: DC | PRN
  Administered 2015-04-12: 50 ug via INTRAVENOUS

## 2015-04-12 MED ORDER — CIPROFLOXACIN IN D5W 400 MG/200ML IV SOLN
400.0000 mg | INTRAVENOUS | Status: AC
  Administered 2015-04-12: 400 mg via INTRAVENOUS
  Filled 2015-04-12: qty 200

## 2015-04-12 MED ORDER — HYDROCODONE-ACETAMINOPHEN 5-325 MG PO TABS: 1.0000 | ORAL_TABLET | Freq: Four times a day (QID) | ORAL | Status: DC | PRN

## 2015-04-12 MED ORDER — STERILE WATER FOR IRRIGATION IR SOLN
Status: DC | PRN
  Administered 2015-04-12: 6000 mL

## 2015-04-12 MED ORDER — CIPROFLOXACIN IN D5W 400 MG/200ML IV SOLN
INTRAVENOUS | Status: AC
  Filled 2015-04-12: qty 200

## 2015-04-12 MED ORDER — MIDAZOLAM HCL 2 MG/2ML IJ SOLN
INTRAMUSCULAR | Status: AC
  Filled 2015-04-12: qty 2

## 2015-04-12 MED ORDER — FENTANYL CITRATE (PF) 100 MCG/2ML IJ SOLN
25.0000 ug | INTRAMUSCULAR | Status: DC | PRN
  Filled 2015-04-12: qty 1

## 2015-04-12 MED ORDER — MIDAZOLAM HCL 5 MG/5ML IJ SOLN
INTRAMUSCULAR | Status: DC | PRN
  Administered 2015-04-12: 2 mg via INTRAVENOUS

## 2015-04-12 MED ORDER — FENTANYL CITRATE (PF) 100 MCG/2ML IJ SOLN
INTRAMUSCULAR | Status: AC
  Filled 2015-04-12: qty 4

## 2015-04-12 MED ORDER — LACTATED RINGERS IV SOLN
INTRAVENOUS | Status: DC
  Administered 2015-04-12: 11:00:00 via INTRAVENOUS
  Filled 2015-04-12: qty 1000

## 2015-04-12 MED ORDER — PROMETHAZINE HCL 25 MG/ML IJ SOLN
6.2500 mg | INTRAMUSCULAR | Status: DC | PRN
  Filled 2015-04-12: qty 1

## 2015-04-12 SURGICAL SUPPLY — 28 items
BAG DRAIN URO-CYSTO SKYTR STRL (DRAIN) ×3 IMPLANT
BAG DRN UROCATH (DRAIN) ×1
CANISTER SUCT LVC 12 LTR MEDI- (MISCELLANEOUS) IMPLANT
CATH ROBINSON RED A/P 12FR (CATHETERS) IMPLANT
CATH ROBINSON RED A/P 14FR (CATHETERS) ×5 IMPLANT
CLOTH BEACON ORANGE TIMEOUT ST (SAFETY) ×3 IMPLANT
ELECT REM PT RETURN 9FT ADLT (ELECTROSURGICAL)
ELECTRODE REM PT RTRN 9FT ADLT (ELECTROSURGICAL) IMPLANT
GLOVE BIO SURGEON STRL SZ 6.5 (GLOVE) ×1 IMPLANT
GLOVE BIO SURGEON STRL SZ7.5 (GLOVE) ×3 IMPLANT
GLOVE BIO SURGEONS STRL SZ 6.5 (GLOVE) ×1
GLOVE BIOGEL PI IND STRL 6.5 (GLOVE) IMPLANT
GLOVE BIOGEL PI INDICATOR 6.5 (GLOVE) ×4
GOWN STRL REUS W/ TWL LRG LVL3 (GOWN DISPOSABLE) ×1 IMPLANT
GOWN STRL REUS W/ TWL XL LVL3 (GOWN DISPOSABLE) ×1 IMPLANT
GOWN STRL REUS W/TWL LRG LVL3 (GOWN DISPOSABLE) ×3
GOWN STRL REUS W/TWL XL LVL3 (GOWN DISPOSABLE) ×3
INJECTION MACROPLASIQ 2.5ML UN (Female Continence) IMPLANT
MACROPLASTIQUE 2.5ML UNIT (Female Continence) ×6 IMPLANT
MANIFOLD NEPTUNE II (INSTRUMENTS) ×2 IMPLANT
NDL RIGID UROPLASTY (NEEDLE) IMPLANT
NDL SAFETY ECLIPSE 18X1.5 (NEEDLE) ×1 IMPLANT
NEEDLE HYPO 18GX1.5 SHARP (NEEDLE) ×3
NEEDLE RIGID UROPLASTY (NEEDLE) ×3 IMPLANT
PACK CYSTO (CUSTOM PROCEDURE TRAY) ×3 IMPLANT
SYR 20CC LL (SYRINGE) ×1 IMPLANT
SYR BULB IRRIGATION 50ML (SYRINGE) IMPLANT
WATER STERILE IRR 3000ML UROMA (IV SOLUTION) ×5 IMPLANT

## 2015-04-12 NOTE — Discharge Instructions (Signed)
I have reviewed discharge instructions in detail with the patient. They will follow-up with me or their physician as scheduled. My nurse will also be calling the patients as per protocol.  ° ° ° °CYSTOSCOPY HOME CARE INSTRUCTIONS ° °Activity: °Rest for the remainder of the day.  Do not drive or operate equipment today.  You may resume normal activities in one to two days as instructed by your physician.  ° °Meals: °Drink plenty of liquids and eat light foods such as gelatin or soup this evening.  You may return to a normal meal plan tomorrow. ° °Return to Work: °You may return to work in one to two days or as instructed by your physician. ° °Special Instructions / Symptoms: °Call your physician if any of these symptoms occur: ° ° -persistent or heavy bleeding ° -bleeding which continues after first few urination ° -large blood clots that are difficult to pass ° -urine stream diminishes or stops completely ° -fever equal to or higher than 101 degrees Farenheit. ° -cloudy urine with a strong, foul odor ° -severe pain ° °Females should always wipe from front to back after elimination.  You may feel some burning pain when you urinate.  This should disappear with time.  Applying moist heat to the lower abdomen or a hot tub bath may help relieve the pain. \ ° °Follow-Up / Date of Return Visit to Your Physician:  °Call for an appointment to arrange follow-up. ° ° ° ° °Post Anesthesia Home Care Instructions ° °Activity: °Get plenty of rest for the remainder of the day. A responsible adult should stay with you for 24 hours following the procedure.  °For the next 24 hours, DO NOT: °-Drive a car °-Operate machinery °-Drink alcoholic beverages °-Take any medication unless instructed by your physician °-Make any legal decisions or sign important papers. ° °Meals: °Start with liquid foods such as gelatin or soup. Progress to regular foods as tolerated. Avoid greasy, spicy, heavy foods. If nausea and/or vomiting occur, drink only  clear liquids until the nausea and/or vomiting subsides. Call your physician if vomiting continues. ° °Special Instructions/Symptoms: °Your throat may feel dry or sore from the anesthesia or the breathing tube placed in your throat during surgery. If this causes discomfort, gargle with warm salt water. The discomfort should disappear within 24 hours. ° °If you had a scopolamine patch placed behind your ear for the management of post- operative nausea and/or vomiting: ° °1. The medication in the patch is effective for 72 hours, after which it should be removed.  Wrap patch in a tissue and discard in the trash. Wash hands thoroughly with soap and water. °2. You may remove the patch earlier than 72 hours if you experience unpleasant side effects which may include dry mouth, dizziness or visual disturbances. °3. Avoid touching the patch. Wash your hands with soap and water after contact with the patch. °  ° °

## 2015-04-12 NOTE — Interval H&P Note (Signed)
History and Physical Interval Note:  04/12/2015 8:07 AM  Dorothy Rojas  has presented today for surgery, with the diagnosis of stress incontinence  The various methods of treatment have been discussed with the patient and family. After consideration of risks, benefits and other options for treatment, the patient has consented to  Procedure(s): CYSTOSCOPY MACROPLASTIQUE IMPLANT (N/A) as a surgical intervention .  The patient's history has been reviewed, patient examined, no change in status, stable for surgery.  I have reviewed the patient's chart and labs.  Questions were answered to the patient's satisfaction.     [name]

## 2015-04-12 NOTE — Transfer of Care (Signed)
Last Vitals:  Filed Vitals:   04/12/15 1029  BP: 137/78  Pulse: 68  Temp: 36.9 C  Resp: 16    Immediate Anesthesia Transfer of Care Note  Patient: Dorothy Rojas  Procedure(s) Performed: Procedure(s) (LRB): CYSTOSCOPY MACROPLASTIQUE IMPLANT (N/A)  Patient Location: PACU  Anesthesia Type: General  Level of Consciousness: awake, alert  and oriented  Airway & Oxygen Therapy: Patient Spontanous Breathing and Patient connected to face mask oxygen  Post-op Assessment: Report given to PACU RN and Post -op Vital signs reviewed and stable  Post vital signs: Reviewed and stable  Complications: No apparent anesthesia complications

## 2015-04-12 NOTE — Anesthesia Procedure Notes (Signed)
Procedure Name: LMA Insertion Date/Time: 04/12/2015 12:16 PM Performed by: Mechele Claude Pre-anesthesia Checklist: Patient identified, Emergency Drugs available, Suction available and Patient being monitored Patient Re-evaluated:Patient Re-evaluated prior to inductionOxygen Delivery Method: Circle System Utilized Preoxygenation: Pre-oxygenation with 100% oxygen Intubation Type: IV induction Ventilation: Mask ventilation without difficulty LMA: LMA inserted LMA Size: 4.0 Number of attempts: 1 Airway Equipment and Method: bite block Placement Confirmation: positive ETCO2 Tube secured with: Tape Dental Injury: Teeth and Oropharynx as per pre-operative assessment

## 2015-04-12 NOTE — Op Note (Signed)
Preoperative diagnosis: Intrinsic sphincter deficiency and stress urinary incontinence Postoperative diagnosis: Intrinsic sphincter deficiency and stress urinary incontinence Surgery: Cystoscopy and injection of Macroplastique Surgeon: Dr. Nicki Reaper Indea Dearman  The patient has the above diagnoses and consented above procedure. The injection scope was utilized. Bladder mucosa and trigone were normal. She had a small cystocele. She is a tissue bridge between 3 and 6:00 at the bladder neck or she's had before. She had no erosion. Urethra looks soft and supple.  She has descensus of urethra at rest. It looked open at rest. It was very short. I reviewed my first operative note  Utilizing injection scope I injected at 5:00 with a very nice bleb. Because of her anatomy was very difficult to inject at 8:00 or 7:00. I ended up injecting the second Macroplastique syringe at 3:00 with good coaptation. There was no extravasation. Technically I thought it went reasonable he well but it took me a long time to try to inject further short urethra.  Hopefully the procedure will improve her continence status.

## 2015-04-12 NOTE — Anesthesia Preprocedure Evaluation (Signed)
Anesthesia Evaluation  Patient identified by MRN, date of birth, ID band Patient awake    Reviewed: Allergy & Precautions, NPO status , Patient's Chart, lab work & pertinent test results  Airway Mallampati: II  TM Distance: >3 FB Neck ROM: Full    Dental no notable dental hx.    Pulmonary neg pulmonary ROS, former smoker,  breath sounds clear to auscultation  Pulmonary exam normal       Cardiovascular hypertension, Pt. on medications DVT Normal cardiovascular examRhythm:Regular Rate:Normal  History of DVT of lower extremity  dx 07-28-2013 left lower extrem. -- xarelto for 6 months     Neuro/Psych negative neurological ROS  negative psych ROS   GI/Hepatic Neg liver ROS, GERD-  Medicated,  Endo/Other  negative endocrine ROS  Renal/GU negative Renal ROS  negative genitourinary   Musculoskeletal negative musculoskeletal ROS (+)   Abdominal   Peds negative pediatric ROS (+)  Hematology negative hematology ROS (+) Heterozygous factor V Leiden mutation dx 07/2013  hematologist-- dr Bernadene Bell ([facility])-- per note "Low Risk"     Anesthesia Other Findings   Reproductive/Obstetrics negative OB ROS                             Anesthesia Physical Anesthesia Plan  ASA: II  Anesthesia Plan: General   Post-op Pain Management:    Induction: Intravenous  Airway Management Planned: LMA  Additional Equipment:   Intra-op Plan:   Post-operative Plan: Extubation in OR  Informed Consent: I have reviewed the patients History and Physical, chart, labs and discussed the procedure including the risks, benefits and alternatives for the proposed anesthesia with the patient or authorized representative who has indicated his/her understanding and acceptance.   Dental advisory given  Plan Discussed with: CRNA and Surgeon  Anesthesia Plan Comments:         Anesthesia Quick  Evaluation

## 2015-04-12 NOTE — H&P (Signed)
History of Present Illness   Dorothy Rojas has complicated urinary incontinence. In May 2016 she was dry after one Macroplastique. She was dry on 01/10/2015 when she saw Fritz Pickerel with dysuria. Urine culture was negative. She has been a responder to PTNS and antimuscarinics and beta 3 antagonist in the past. I had mentioned Interstim with Botox, but I went through a urethral injectable on 11/20/2014. She had a mildly short urethra that was wide open at rest. I entered the distal third of the urethra because the urethra was short. I believe that procedure went very well.  Frequency: Stable.  Urinalysis: Negative and sent for culture.  Review of systems: No change in bowel or neurologic systems.   Since Dorothy Devall saw Fritz Pickerel she has had significant incontinence. She leaks without awareness. She leaks just going from sitting to standing position. She sometimes leaks a little bit when she is coughing or sneezing. She is wearing 5-7 pads a day. There are no modifying factors or associated signs or symptoms. There are no other aggravating or relieving factors. The presentation is moderate in severity and ongoing.  I thought it was best to perform a cystoscopy and pelvic examination for safety reasons. I dictated a long note on her 11/20/2014. There is no question that she had been leaking not associated awareness in the past. She was soaking 5 or 6 pads a day.    Past Medical History Problems  1. History of deep venous thrombosis (Z86.718) 2. History of factor V Leiden mutation (Z86.2) 3. History of hypercholesterolemia (Z86.39) 4. History of hypertension (Z86.79) 5. History of Murmur (R01.1)  Surgical History Problems  1. History of Anterior Colporrhaphy, Repair Of Cystocele 2. History of Elbow Surgery 3. History of Endoscopic Injection Of Implant Material Into Bladder Neck 4. History of Hysterectomy  Current Meds 1. Aspirin 81 MG Oral Tablet Chewable;  Therapy: (Recorded:04Feb2016) to  Recorded 2. Calcium-D TABS;  Therapy: (Recorded:12Dec2007) to Recorded 3. Lisinopril-Hydrochlorothiazide TABS;  Therapy: (Recorded:03Sep2008) to Recorded 4. Oxybutynin Chloride ER 10 MG Oral Tablet Extended Release 24 Hour; Take 1 tablet  twice daily;  Therapy: 20Feb2015 to (Evaluate:30Jun2016)  Requested for: [id number]; Last  Rx:06Jul2015 Ordered 5. Simvastatin 40 MG Oral Tablet;  Therapy: (Recorded:19Apr2011) to Recorded 6. Vitamin E TABS;  Therapy: (Recorded:04Feb2016) to Recorded  Allergies Medication  1. Naproxen TABS  Family History Problems  1. Family history of Acute Myocardial Infarction : Father 2. Family history of Cirrhosis : Mother 3. Family history of Death In The Family Father   Deceased at age 61; MI 16. Family history of Death In The Family Mother   Deceased at age 23; Cirrhosis 5. Family history of Family Health Status Number Of Children   2 sons and 1 daughter  Social History Problems  1. Activities Of Daily Living 2. Alcohol Use   2x/week 3. Denied: Caffeine Use 4. Exercise Habits   She indicates she is exercising 5 or 6 times a day utilizing cardio, yoga and stretching     techniques. 5. Former smoker [phone]) 6. Living Independently With Spouse 7. Occupation:   bookkeeper 8. Self-reliant In Usual Daily Activities 9. History of Tobacco Use   smoked 1ppd for 21 yrs & quit about 1985  Vitals Vital Signs [Data Includes: Last 1 Day]  Recorded: 25Jul2016 09:46AM  Blood Pressure: 118 / 65 Temperature: 98.9 F Heart Rate: 71  Results/Data  Urine [Data Includes: Last 1 Day]   [id number]  COLOR YELLOW   APPEARANCE CLEAR   SPECIFIC GRAVITY 1.025  pH 6.0   GLUCOSE NEG mg/dL  BILIRUBIN NEG   KETONE NEG mg/dL  BLOOD SMALL   PROTEIN NEG mg/dL  UROBILINOGEN 0.2 mg/dL  NITRITE NEG   LEUKOCYTE ESTERASE TRACE   SQUAMOUS EPITHELIAL/HPF RARE   WBC 0-2 WBC/hpf  RBC 0-2 RBC/hpf  BACTERIA RARE   CRYSTALS NONE SEEN   CASTS NONE SEEN     Assessment Assessed  1. Female stress incontinence (N39.3) 2. Urinary frequency (R35.0) 3. Urinary incontinence without sensory awareness (N39.42)  Plan Chronic cystitis  1. Cysto; Status:Complete;   Done: [id number] Female stress incontinence  2. Follow-up Schedule Surgery Office  Follow-up  Status: Complete  Done: [id number]  Discussion/Summary   Dorothy Petre after verbal consent underwent flexible cystoscopy. Once again, she had a short urethra with a small cystocele. She had some mild erythema of the floor of her bladder but otherwise the mucosa looked normal. She had some little crystals at the bottom of her bladder that would float around easily. I had a very good look at her bladder neck and urethra twice. I saw no erosion. It is almost as though at 5 o'clock near the bladder neck she had thinning of her tissue, though it is hard to describe. It is almost as if she had a tissue bridge, but I do not think she did. The tissues actually looked soft and supple, and there was moderate coaptation of the urethra versus last time under anesthesia. She had a negative cough test before urodynamics and a positive one afterwards.  I think it would be very reasonable for Dorothy Dix to repeat her Macroplastique. We should wait closer to 4 months as per protocol. Again, I would look for erosion. The findings in the bladder made me think she could have some erosion of the Macroplastique, but I did not see any today.   Triggering overactive bladder with up regulation from a possible infection was discussed. I will call if the culture is positive. We both agreed to do another Macroplastique. She understands the findings could have been secondary to a healed erosion or an ongoing one. I will look again in the operating room. Procedure scheduled. She understands that I obviously cannot guarantee the same success.  After a thorough review of the management options for the patient's condition the patient   elected to proceed with surgical therapy as noted above. We have discussed the potential benefits and risks of the procedure, side effects of the proposed treatment, the likelihood of the patient achieving the goals of the procedure, and any potential problems that might occur during the procedure or recuperation. Informed consent has been obtained.

## 2015-04-13 ENCOUNTER — Encounter (HOSPITAL_BASED_OUTPATIENT_CLINIC_OR_DEPARTMENT_OTHER): Payer: Self-pay | Admitting: Urology

## 2015-04-24 ENCOUNTER — Other Ambulatory Visit (INDEPENDENT_AMBULATORY_CARE_PROVIDER_SITE_OTHER): Payer: Medicare Other

## 2015-04-24 ENCOUNTER — Other Ambulatory Visit: Payer: Medicare Other

## 2015-04-24 ENCOUNTER — Encounter: Payer: Self-pay | Admitting: Internal Medicine

## 2015-04-24 ENCOUNTER — Ambulatory Visit (INDEPENDENT_AMBULATORY_CARE_PROVIDER_SITE_OTHER): Payer: Medicare Other | Admitting: Internal Medicine

## 2015-04-24 VITALS — BP 120/66 | HR 76 | Ht 64.0 in | Wt 119.6 lb

## 2015-04-24 DIAGNOSIS — Z1211 Encounter for screening for malignant neoplasm of colon: Secondary | ICD-10-CM

## 2015-04-24 DIAGNOSIS — R6881 Early satiety: Secondary | ICD-10-CM | POA: Diagnosis not present

## 2015-04-24 DIAGNOSIS — R634 Abnormal weight loss: Secondary | ICD-10-CM

## 2015-04-24 DIAGNOSIS — I6523 Occlusion and stenosis of bilateral carotid arteries: Secondary | ICD-10-CM

## 2015-04-24 DIAGNOSIS — K219 Gastro-esophageal reflux disease without esophagitis: Secondary | ICD-10-CM

## 2015-04-24 LAB — BASIC METABOLIC PANEL
BUN: 19 mg/dL (ref 6–23)
CHLORIDE: 102 meq/L (ref 96–112)
CO2: 31 meq/L (ref 19–32)
Calcium: 9.4 mg/dL (ref 8.4–10.5)
Creatinine, Ser: 0.69 mg/dL (ref 0.40–1.20)
GFR: 89.64 mL/min (ref >60.00)
GLUCOSE: 92 mg/dL (ref 70–99)
POTASSIUM: 3.8 meq/L (ref 3.5–5.1)
SODIUM: 140 meq/L (ref 135–145)

## 2015-04-24 MED ORDER — NA SULFATE-K SULFATE-MG SULF 17.5-3.13-1.6 GM/177ML PO SOLN: 1.0000 | Freq: Once | ORAL | Status: DC

## 2015-04-24 NOTE — Progress Notes (Signed)
HISTORY OF PRESENT ILLNESS:  Dorothy Rojas is a 69 y.o. female with past medical history as listed below who is sent today by her primary care provider Dr. Rita Rojas regarding unexplained weight loss. The patient is new to this office. The patient reports a 1214 pound weight loss over the past 6-8 months. She denies any significant change to her diet though does describe some mild early satiety. She does have chronic GERD for which she requires Nexium on most days. She has noticed increased belching. She has chronic constipation for which she uses stool softeners. GI review of systems is otherwise negative. She does have a history of chronic UTIs. She is a reformed smoker and has had negative chest x-rays in September 2015 and 02/14/2015. Laboratory review from July 2016 including comprehensive metabolic panel, TSH, and CBC with differential are remarkable only for mild normocytic anemia with hemoglobin 11.7. Weight loss has concerned her and her friends. She did undergo colonoscopy approximately 10 years ago (2007) with Dr. Jim Rojas. Apparently unremarkable. Records have been requested  REVIEW OF SYSTEMS:  All non-GI ROS negative except for urinary leakage, hematuria  Past Medical History  Diagnosis Date  . Hypertension   . Osteopenia     last DEXA 10/2009  . Heterozygous factor V Leiden mutation dx 07/2013      hematologist--  dr Dorothy Rojas ([facility])--  per note "Low Risk"  . Microscopic hematuria     chronic   . Multinodular thyroid     a. Path benign 2013.  [name] History of DVT of lower extremity     dx 07-28-2013  left lower extrem.  --  xarelto for 6 months  . SUI (stress urinary incontinence, female)   . Lymphadenopathy, inguinal     bilateral  . Prediabetes   . Chronic constipation   . Arthritis     knees  . GERD (gastroesophageal reflux disease)   . Multinodular goiter   . Anemia   . OAB (overactive bladder)     Past Surgical History  Procedure Laterality  Date  . Total abdominal hysterectomy w/ bilateral salpingoophorectomy  09/  2000  . Appendectomy  1973  . Ovarian cyst removal Left 1973  . Incontinence surgery  04/1999    Re-do at [facility] for sling complications in 1610  . Neuroma surgery Bilateral 1980's     feet  . Lateral epicondyle release Right   . Cystocele repair  12-03-2010  . Knee arthroscopy Right 04-05-2013  . Transthoracic echocardiogram  05-04-2014    normal ,  ef 60-65%  . Tonsillectomy  as child  . Cataract extraction w/ intraocular lens  implant, bilateral  2015  . Cystoscopy macroplastique implant N/A 12/12/2014    Procedure: CYSTOSCOPY MACROPLASTIQUE INJECTION;  Surgeon: Dorothy Loser, MD;  Location: [facility];  Service: Urology;  Laterality: N/A;  . Cystoscopy macroplastique implant N/A 04/12/2015    Procedure: CYSTOSCOPY MACROPLASTIQUE IMPLANT;  Surgeon: Dorothy Loser, MD;  Location: [facility];  Service: Urology;  Laterality: N/A;    Social History Dorothy Rojas  reports that she quit smoking about 31 years ago. Her smoking use included Cigarettes. She has a 21 pack-year smoking history. She has never used smokeless tobacco. She reports that she drinks alcohol. She reports that she does not use illicit drugs.  family history includes COPD in her father; Cirrhosis in her mother; Deep vein thrombosis in her brother; Diabetes in her brother and mother; Heart disease in  her sister; Heart disease (age of onset: 81) in her father; Heart disease (age of onset: 29) in her brother; Hypertension in her father; Osteogenesis imperfecta in her sister. There is no history of Cancer.  Allergies  Allergen Reactions  . Naprosyn [Naproxen] Rash       PHYSICAL EXAMINATION: Vital signs: BP 120/66 mmHg  Pulse 76  Ht 5\' 4"  (1.626 m)  Wt 119 lb 9.6 oz (54.25 kg)  BMI 20.52 kg/m2  Constitutional: generally well-appearing, no acute distress Psychiatric: alert and oriented x3, cooperative Eyes:  extraocular movements intact, anicteric, conjunctiva pink Mouth: oral pharynx moist, no lesions Neck: supple without thyromegaly Lymph: no lymphadenopathy Cardiovascular: heart regular rate and rhythm, no murmur Lungs: clear to auscultation bilaterally Abdomen: soft, nontender, nondistended, no obvious ascites, no peritoneal signs, normal bowel sounds, no organomegaly Rectal: Deferred until colonoscopy Extremities: no clubbing cyanosis or lower extremity edema bilaterally Skin: no lesions on visible extremities Neuro: No focal deficits. Normal deep tendon reflexes No asterixis.    ASSESSMENT:  #1. Unexplained weight loss. Rule out underlying malignancy #2. Chronic GERD requiring Nexium #3. Mild early satiety. Question whether this may relate to her weight loss #4. Colon cancer screening. Has been about 10 years. Should repeat for screening and to evaluate weight loss   PLAN:  #1. Contrast-enhanced CT scan of the abdomen and pelvis to rule out occult malignancy #2. Colonoscopy to evaluate weight loss and provide colon cancer screening as she is essentially due.The nature of the procedure, as well as the risks, benefits, and alternatives were carefully and thoroughly reviewed with the patient. Ample time for discussion and questions allowed. The patient understood, was satisfied, and agreed to proceed. #3. Upper endoscopy to evaluate weight loss, chronic GERD, and early satiety.The nature of the procedure, as well as the risks, benefits, and alternatives were carefully and thoroughly reviewed with the patient. Ample time for discussion and questions allowed. The patient understood, was satisfied, and agreed to proceed. #4. Continue PPI #5. Reflux precautions   A copy of this consultation note has been sent to Dr. Tomi Rojas

## 2015-04-24 NOTE — Patient Instructions (Signed)
Your physician has requested that you go to the basement for the following lab work before leaving today:  bmet    You have been scheduled for an endoscopy and colonoscopy. Please follow the written instructions given to you at your visit today. Please pick up your prep supplies at the pharmacy within the next 1-3 days. If you use inhalers (even only as needed), please bring them with you on the day of your procedure.   You have been scheduled for a CT scan of the abdomen and pelvis at [facility] ([address]---this is in the same building as [facility]).   You are scheduled on 05/02/2015 at 9:30am. You should arrive 15 minutes prior to your appointment time for registration. Please follow the written instructions below on the day of your exam:  WARNING: IF YOU ARE ALLERGIC TO IODINE/X-RAY DYE, PLEASE NOTIFY RADIOLOGY IMMEDIATELY AT [phone]! YOU WILL BE GIVEN A 13 HOUR PREMEDICATION PREP.  1) Do not eat or drink anything after 5:30am(4 hours prior to your test) 2) You have been given 2 bottles of oral contrast to drink. The solution may taste better if refrigerated, but do NOT add ice or any other liquid to this solution. Shake well before drinking.    Drink 1 bottle of contrast @ 7;30am (2 hours prior to your exam)  Drink 1 bottle of contrast @ 8:30am (1 hour prior to your exam)  You may take any medications as prescribed with a small amount of water except for the following: Metformin, Glucophage, Glucovance, Avandamet, Riomet, Fortamet, Actoplus Met, Janumet, Glumetza or Metaglip. The above medications must be held the day of the exam AND 48 hours after the exam.  The purpose of you drinking the oral contrast is to aid in the visualization of your intestinal tract. The contrast solution may cause some diarrhea. Before your exam is started, you will be given a small amount of fluid to drink. Depending on your individual set of symptoms, you may also receive an  intravenous injection of x-ray contrast/dye. Plan on being at [facility] for 30 minutes or long, depending on the type of exam you are having performed.  If you have any questions regarding your exam or if you need to reschedule, you may call the CT department at [phone] between the hours of 8:00 am and 5:00 pm, Monday-Friday.  ________________________________________________________________________

## 2015-04-25 ENCOUNTER — Other Ambulatory Visit: Payer: Medicare Other

## 2015-04-25 DIAGNOSIS — N3020 Other chronic cystitis without hematuria: Secondary | ICD-10-CM | POA: Diagnosis not present

## 2015-04-25 DIAGNOSIS — N3942 Incontinence without sensory awareness: Secondary | ICD-10-CM | POA: Diagnosis not present

## 2015-04-25 DIAGNOSIS — E7800 Pure hypercholesterolemia, unspecified: Secondary | ICD-10-CM

## 2015-04-25 DIAGNOSIS — Z5181 Encounter for therapeutic drug level monitoring: Secondary | ICD-10-CM | POA: Diagnosis not present

## 2015-04-25 LAB — LIPID PANEL
CHOL/HDL RATIO: 3 ratio (ref <=5.0)
Cholesterol: 148 mg/dL (ref 125–200)
HDL: 49 mg/dL (ref >=46)
LDL CALC: 69 mg/dL (ref <130)
Triglycerides: 151 mg/dL — ABNORMAL HIGH (ref <150)
VLDL: 30 mg/dL (ref <30)

## 2015-04-27 NOTE — Anesthesia Postprocedure Evaluation (Signed)
  Anesthesia Post-op Note  Patient: Dorothy Rojas  Procedure(s) Performed: Procedure(s) (LRB): CYSTOSCOPY MACROPLASTIQUE IMPLANT (N/A)  Patient Location: PACU  Anesthesia Type: General  Level of Consciousness: awake and alert   Airway and Oxygen Therapy: Patient Spontanous Breathing  Post-op Pain: mild  Post-op Assessment: Post-op Vital signs reviewed, Patient's Cardiovascular Status Stable, Respiratory Function Stable, Patent Airway and No signs of Nausea or vomiting  Last Vitals:  Filed Vitals:   04/12/15 1433  BP: 133/67  Pulse: 54  Temp: 36.4 C  Resp: 14    Post-op Vital Signs: stable   Complications: No apparent anesthesia complications

## 2015-05-01 ENCOUNTER — Encounter: Payer: Self-pay | Admitting: Family Medicine

## 2015-05-01 ENCOUNTER — Other Ambulatory Visit: Payer: Medicare Other

## 2015-05-01 ENCOUNTER — Ambulatory Visit (INDEPENDENT_AMBULATORY_CARE_PROVIDER_SITE_OTHER): Payer: Medicare Other | Admitting: Family Medicine

## 2015-05-01 VITALS — BP 112/68 | HR 66 | Resp 12 | Ht 64.0 in | Wt 119.2 lb

## 2015-05-01 DIAGNOSIS — R7309 Other abnormal glucose: Secondary | ICD-10-CM | POA: Diagnosis not present

## 2015-05-01 DIAGNOSIS — I10 Essential (primary) hypertension: Secondary | ICD-10-CM

## 2015-05-01 DIAGNOSIS — R7301 Impaired fasting glucose: Secondary | ICD-10-CM

## 2015-05-01 DIAGNOSIS — R7303 Prediabetes: Secondary | ICD-10-CM

## 2015-05-01 DIAGNOSIS — D688 Other specified coagulation defects: Secondary | ICD-10-CM

## 2015-05-01 DIAGNOSIS — K219 Gastro-esophageal reflux disease without esophagitis: Secondary | ICD-10-CM

## 2015-05-01 DIAGNOSIS — N3281 Overactive bladder: Secondary | ICD-10-CM

## 2015-05-01 DIAGNOSIS — Z23 Encounter for immunization: Secondary | ICD-10-CM | POA: Diagnosis not present

## 2015-05-01 DIAGNOSIS — D6851 Activated protein C resistance: Secondary | ICD-10-CM

## 2015-05-01 LAB — HEMOGLOBIN A1C
HEMOGLOBIN A1C: 5.9 % — AB (ref <5.7)
MEAN PLASMA GLUCOSE: 123 mg/dL — AB (ref <117)

## 2015-05-01 NOTE — Patient Instructions (Signed)
**Note De-Identified Dorothy Rojas Obfuscation** Continue your same medications. Have the pharmacy contact us in the next 1-2 months when your refills are needed.  At some point we should have your potassium rechecked, due to a potential interaction with your medications.

## 2015-05-01 NOTE — Progress Notes (Signed)
Chief Complaint  Patient presents with  . med check   Weight loss--she saw GI last week, and is scheduled for CT tomorrow; also scheduled for upper and lower endoscopy in November. For the mostpart, she has been maintaining her weight, no significant further weight losses. Denies any bowel changes.  Hypertension follow-up: Blood pressures elsewhere are 120s/70's. Denies dizziness, chest pain. Denies side effects of medications. Denies headaches. She denies exertional chest pain (just as per reflux section below). She does have some slight swelling in her ankles if she is on her feet a lot, by the end of the day, never present when she first wakes up.  GERD: Has been taking OTC Nexium. She hasn't had any further chest pain (started taking PPI after ER visit for chest pain).  She has occasional heartburn--had some when in [place] this week, a burning in her chest that radiated to her ears, and it was relieved by taking Tums.  Denies dysphagia.  Hematuria--underwent eval by Dr. McDiarmid with normal cystoscopy. She had CT which showed some cysts--he felt all was okay. Bladder is colonized with bacteria, and will always show bacteria in urine, but also has still had some intermittent infections, the last time was in June. She was just prescribed Trimethoprim to take once daily for UTI prevention. Got second Macroplastique procedure in September.  She is doing much better--only very slight leakage with cough/sneeze which she feels is normal for her age, having had 3 children.  This was her second procedure, had done very well after the first one in May, but symptoms recurred after having a UTI.  Hyperlipidemia follow-up: Patient is reportedly following a low-fat, low cholesterol diet. Compliant with medications and denies medication side effects. She occasionally gets some leg cramps, more related to the shoes she is wearing--this has eased up since last visit, wearing different shoes.  H/o  DVT--she completed 6 months of xarelto.She was discharged from the care of hematologist. If any recurrence, will need lifelong treatment.  She is taking $RemoveB'81mg'DbIkRqEq$  of ASA daily. No other blood thinning agents were recommended. Denies any bleeding/bruising.  Constipation--doing well, on stool softener BID.  PMH, PSH, SH and FH reviewed/updated.  Outpatient Encounter Prescriptions as of 05/01/2015  Medication Sig Note  . aspirin 81 MG tablet Take 81 mg by mouth daily.   . Calcium Carbonate-Vitamin D (CALCIUM 600+D) 600-400 MG-UNIT per tablet Take 1 tablet by mouth daily.   [name] docusate sodium (COLACE) 100 MG capsule Take 100 mg by mouth 2 (two) times daily.   [name] esomeprazole (NEXIUM) 20 MG capsule Take 20 mg by mouth every evening.   [name] lisinopril-hydrochlorothiazide (PRINZIDE,ZESTORETIC) 20-12.5 MG per tablet TAKE 1 TABLET ONCE DAILY. (Patient taking differently: TAKE 1 TABLET ONCE DAILY.---  TAKES IN AM)   . Omega-3 Fatty Acids (FISH OIL) 1200 MG CPDR Take 1 capsule by mouth daily.   [name] oxybutynin (DITROPAN) 5 MG tablet Take 10 mg by mouth daily.  05/01/2015: Pt states she takes ER once daily  . simvastatin (ZOCOR) 40 MG tablet TAKE ONE TABLET AT BEDTIME.   [name] trimethoprim (TRIMPEX) 100 MG tablet Take 100 mg by mouth daily. 05/01/2015: Per Dr. McDiarmid for UTI prevention, started 04/2015  . Na Sulfate-K Sulfate-Mg Sulf SOLN Take 1 kit by mouth once. (Patient not taking: Reported on 05/01/2015) 05/01/2015: Contrast for CT tomorrow   No facility-administered encounter medications on file as of 05/01/2015.   Allergies  Allergen Reactions  . Naprosyn [Naproxen] Rash   ROS:  No fever,  chills, headaches, dizziness, URI symptoms ,cough, shortness of breath, exertional chest pain.  Mild heartburn as per HPI. No polydipsia or polyuria. No vomiting, diarrhea, bowel changes, depression, anxiety, bleeding, bruising, depression, anxiety or other complaints.  See HPI. Mild stress urinary incontinence; no  dysuria.  PHYSICAL EXAM: BP 112/68 mmHg  Pulse 66  Resp 12  Ht $R'5\' 4"'xB$  (1.626 m)  Wt 119 lb 3.2 oz (54.069 kg)  BMI 20.45 kg/m2  Well developed, pleasant female in no distress HEENT: PERRL, EOMI, conjunctiva clear Neck: no lymphadenopathy, thyromegaly or carotid bruit Heart: regular rate and rhythm Lungs: clear bilaterally Back: no spinal or CVA tenderness Abdomen: soft, nontender, no organomegaly or mass Extremities: no edema, 2+ pulses Neuro: alert and oriented. Cranial nerves intact, normal strength, gait Psych: normal mood, affect, hygiene and grooming Skin: no visible bruising or rashes  Lab Results  Component Value Date   CHOL 148 04/25/2015   HDL 49 04/25/2015   LDLCALC 69 04/25/2015   TRIG 151* 04/25/2015   CHOLHDL 3.0 04/25/2015     Chemistry      Component Value Date/Time   NA 140 04/24/2015 1141   NA 144 01/26/2014 0809   K 3.8 04/24/2015 1141   K 4.6 01/26/2014 0809   CL 102 04/24/2015 1141   CO2 31 04/24/2015 1141   CO2 29 01/26/2014 0809   BUN 19 04/24/2015 1141   BUN 18.5 01/26/2014 0809   CREATININE 0.69 04/24/2015 1141   CREATININE 0.68 02/14/2015 0001   CREATININE 0.8 01/26/2014 0809      Component Value Date/Time   CALCIUM 9.4 04/24/2015 1141   CALCIUM 9.4 01/26/2014 0809   ALKPHOS 56 02/14/2015 0001   ALKPHOS 57 10/13/2013 0910   AST 21 02/14/2015 0001   AST 21 10/13/2013 0910   ALT 17 02/14/2015 0001   ALT 23 10/13/2013 0910   BILITOT 0.5 02/14/2015 0001   BILITOT 0.42 10/13/2013 0910      ASSESSMENT/PLAN:  Essential hypertension, benign - well controlled  Need for prophylactic vaccination and inoculation against influenza - Plan: Flu vaccine HIGH DOSE PF  Prediabetes - Plan: Hemoglobin A1c  OAB (overactive bladder) - controlled; mild SUI.  Heterozygous factor V Leiden mutation - no further problems with clotting. Continue aspirin  Gastroesophageal reflux disease without esophagitis - controlled; EGD scheduled for  November  Impaired fasting glucose

## 2015-05-02 ENCOUNTER — Ambulatory Visit (INDEPENDENT_AMBULATORY_CARE_PROVIDER_SITE_OTHER)
Admission: RE | Admit: 2015-05-02 | Discharge: 2015-05-02 | Disposition: A | Discharge status: Home / Self Care | Payer: Medicare Other | Source: Ambulatory Visit | Attending: Internal Medicine | Admitting: Internal Medicine

## 2015-05-02 DIAGNOSIS — D1809 Hemangioma of other sites: Secondary | ICD-10-CM | POA: Diagnosis not present

## 2015-05-02 DIAGNOSIS — R634 Abnormal weight loss: Secondary | ICD-10-CM

## 2015-05-02 MED ORDER — IOHEXOL 300 MG/ML  SOLN
100.0000 mL | Freq: Once | INTRAMUSCULAR | Status: AC | PRN
  Administered 2015-05-02: 100 mL via INTRAVENOUS

## 2015-05-03 ENCOUNTER — Telehealth: Payer: Self-pay | Admitting: Internal Medicine

## 2015-05-03 ENCOUNTER — Encounter: Payer: Medicare Other | Admitting: Family Medicine

## 2015-05-03 NOTE — Telephone Encounter (Signed)
See result note.  

## 2015-05-06 ENCOUNTER — Encounter: Payer: Self-pay | Admitting: Family Medicine

## 2015-05-09 ENCOUNTER — Encounter: Payer: Self-pay | Admitting: Family Medicine

## 2015-05-21 DIAGNOSIS — N3020 Other chronic cystitis without hematuria: Secondary | ICD-10-CM | POA: Diagnosis not present

## 2015-05-21 DIAGNOSIS — R350 Frequency of micturition: Secondary | ICD-10-CM | POA: Diagnosis not present

## 2015-05-21 DIAGNOSIS — N3942 Incontinence without sensory awareness: Secondary | ICD-10-CM | POA: Diagnosis not present

## 2015-06-12 DIAGNOSIS — K635 Polyp of colon: Secondary | ICD-10-CM

## 2015-06-12 HISTORY — DX: Polyp of colon: K63.5

## 2015-06-14 DIAGNOSIS — N201 Calculus of ureter: Secondary | ICD-10-CM | POA: Diagnosis not present

## 2015-06-14 DIAGNOSIS — R310 Gross hematuria: Secondary | ICD-10-CM | POA: Diagnosis not present

## 2015-06-14 DIAGNOSIS — N3020 Other chronic cystitis without hematuria: Secondary | ICD-10-CM | POA: Diagnosis not present

## 2015-06-14 DIAGNOSIS — N3942 Incontinence without sensory awareness: Secondary | ICD-10-CM | POA: Diagnosis not present

## 2015-06-21 ENCOUNTER — Encounter: Payer: Self-pay | Admitting: Internal Medicine

## 2015-06-21 ENCOUNTER — Ambulatory Visit (AMBULATORY_SURGERY_CENTER): Payer: Medicare Other | Admitting: Internal Medicine

## 2015-06-21 VITALS — BP 100/54 | HR 73 | Temp 97.4°F | Resp 14 | Ht 64.0 in | Wt 119.0 lb

## 2015-06-21 DIAGNOSIS — D125 Benign neoplasm of sigmoid colon: Secondary | ICD-10-CM | POA: Diagnosis not present

## 2015-06-21 DIAGNOSIS — K219 Gastro-esophageal reflux disease without esophagitis: Secondary | ICD-10-CM

## 2015-06-21 DIAGNOSIS — I10 Essential (primary) hypertension: Secondary | ICD-10-CM | POA: Diagnosis not present

## 2015-06-21 DIAGNOSIS — R634 Abnormal weight loss: Secondary | ICD-10-CM | POA: Diagnosis not present

## 2015-06-21 DIAGNOSIS — Z1211 Encounter for screening for malignant neoplasm of colon: Secondary | ICD-10-CM | POA: Diagnosis not present

## 2015-06-21 MED ORDER — SODIUM CHLORIDE 0.9 % IV SOLN: 500.0000 mL | INTRAVENOUS | Status: DC

## 2015-06-21 NOTE — Patient Instructions (Signed)

## 2015-06-21 NOTE — Op Note (Signed)
[place]  [address] [place], [address]   COLONOSCOPY PROCEDURE REPORT  PATIENT: Dorothy Rojas, Dorothy Rojas  MR#: [id number] BIRTHDATE: April 01, 1946 , 69  yrs. old GENDER: female ENDOSCOPIST: Eustace Quail, MD REFERRED Terrall Laity, M.D. PROCEDURE DATE:  06/21/2015 PROCEDURE:   Colonoscopy, screening and Colonoscopy with snare polypectomy x 1 First Screening Colonoscopy - Avg.  risk and is 50 yrs.  old or older - No.  Prior Negative Screening - Now for repeat screening. 10 or more years since last screening  History of Adenoma - Now for follow-up colonoscopy & has been > or = to 3 yrs.  N/A  Polyps removed today? Yes ASA CLASS:   Class II INDICATIONS:Screening for colonic neoplasia and Colorectal Neoplasm Risk Assessment for this procedure is average risk.. Index exam 2007 (Dr. Lajoyce Corners) MEDICATIONS: Monitored anesthesia care and Propofol 250 mg IV  DESCRIPTION OF PROCEDURE:   After the risks benefits and alternatives of the procedure were thoroughly explained, informed consent was obtained.  The digital rectal exam revealed no abnormalities of the rectum.   The LB [id number]  endoscope was introduced through the anus and advanced to the cecum, which was identified by both the appendix and ileocecal valve. No adverse events experienced.   The quality of the prep was excellent. (Suprep was used)  The instrument was then slowly withdrawn as the colon was fully examined. Estimated blood loss is zero unless otherwise noted in this procedure report.  COLON FINDINGS: A single polyp measuring 3 mm in size was found in the sigmoid colon.  A polypectomy was performed with a cold snare. The resection was complete, the polyp tissue was completely retrieved and sent to histology.   There was moderate diverticulosis noted in the sigmoid colon.   The examination was otherwise normal.  Retroflexion was not performed due to a narrow rectal vault. The time to cecum = 3.7  Withdrawal time = 9.9   The scope was withdrawn and the procedure completed. COMPLICATIONS: There were no immediate complications.  ENDOSCOPIC IMPRESSION: 1.   Single polyp was found in the sigmoid colon; polypectomy was performed with a cold snare 2.   Moderate diverticulosis was noted in the sigmoid colon 3.   The examination was otherwise normal  RECOMMENDATIONS: 1.  Repeat colonoscopy in 5 years if polyp adenomatous; otherwise 10 years 2.  Upper endoscopy today (please see report)  eSigned:  Eustace Quail, MD 06/21/2015 3:12 PM   cc: The Patient and [name], MD

## 2015-06-21 NOTE — Progress Notes (Signed)
Called to room to assist during endoscopic procedure.  Patient ID and intended procedure confirmed with present staff. Received instructions for my participation in the procedure from the performing physician.  

## 2015-06-21 NOTE — Op Note (Signed)
[place]  [address]. [place], [address]   ENDOSCOPY PROCEDURE REPORT  PATIENT: Dorothy, Rojas  MR#: [id number] BIRTHDATE: 01/12/1946 , 69  yrs. old GENDER: female ENDOSCOPIST: Eustace Quail, MD REFERRED BY:  [name], M.D. PROCEDURE DATE:  06/21/2015 PROCEDURE:  EGD, diagnostic ASA CLASS:     Class II INDICATIONS:  history of esophageal reflux and weight loss. MEDICATIONS: Monitored anesthesia care and Propofol 50 mg IV TOPICAL ANESTHETIC: none  DESCRIPTION OF PROCEDURE: After the risks benefits and alternatives of the procedure were thoroughly explained, informed consent was obtained.  The LB [id number] endoscope was introduced through the mouth and advanced to the second portion of the duodenum , Without limitations.  The instrument was slowly withdrawn as the mucosa was fully examined.   EXAM: The esophagus and gastroesophageal junction were completely normal in appearance.  The stomach was entered and closely examined.The antrum, angularis, and lesser curvature were well visualized, including a retroflexed view of the cardia and fundus. The stomach wall was normally distensable.  The scope passed easily through the pylorus into the duodenum.  Retroflexed views revealed no abnormalities.     The scope was then withdrawn from the patient and the procedure completed.  COMPLICATIONS: There were no immediate complications.  ENDOSCOPIC IMPRESSION: 1. Normal EGD 2. GERD 3. No explanation for weight loss found on either colonoscopy or EGD   RECOMMENDATIONS: 1. Continue current medications 2. Return to the care of your urologist and primary physician  REPEAT EXAM:  eSigned:  Eustace Quail, MD 06/21/2015 3:16 PM    CC:The Patient and [name], MD

## 2015-06-21 NOTE — Progress Notes (Signed)
Patient awakening,vss,report to rn 

## 2015-06-22 ENCOUNTER — Telehealth: Payer: Self-pay

## 2015-06-22 NOTE — Telephone Encounter (Signed)
No answer, left voicemail

## 2015-06-23 ENCOUNTER — Other Ambulatory Visit: Payer: Self-pay | Admitting: Family Medicine

## 2015-06-26 ENCOUNTER — Encounter: Payer: Self-pay | Admitting: Family Medicine

## 2015-06-26 ENCOUNTER — Encounter: Payer: Self-pay | Admitting: Internal Medicine

## 2015-06-26 DIAGNOSIS — D126 Benign neoplasm of colon, unspecified: Secondary | ICD-10-CM | POA: Insufficient documentation

## 2015-07-10 DIAGNOSIS — N368 Other specified disorders of urethra: Secondary | ICD-10-CM | POA: Diagnosis not present

## 2015-07-11 ENCOUNTER — Other Ambulatory Visit: Payer: Self-pay | Admitting: Urology

## 2015-07-19 ENCOUNTER — Other Ambulatory Visit: Payer: Self-pay | Admitting: Family Medicine

## 2015-07-19 ENCOUNTER — Encounter: Payer: Self-pay | Admitting: Family Medicine

## 2015-07-19 MED ORDER — LISINOPRIL-HYDROCHLOROTHIAZIDE 20-12.5 MG PO TABS: 1.0000 | ORAL_TABLET | Freq: Every day | ORAL | Status: DC

## 2015-07-23 NOTE — Patient Instructions (Addendum)
KABAO GLAZER  07/23/2015   Your procedure is scheduled on: 07-30-15  Report to [facility]  Entrance take [facility]  elevators to 3rd floor to  [place] at 645 AM.  Call this number if you have problems the morning of surgery [phone]   Remember: ONLY 1 PERSON MAY GO WITH YOU TO SHORT STAY TO GET  READY MORNING OF [place].  Do not eat food or drink liquids :After Midnight.     Take these medicines the morning of surgery with A SIP OF WATER:              You may not have any metal on your body including hair pins and              piercings  Do not wear jewelry, make-up, lotions, powders or perfumes, deodorant             Do not wear nail polish.  Do not shave  48 hours prior to surgery.              Men may shave face and neck.   Do not bring valuables to the hospital. [place].  Contacts, dentures or bridgework may not be worn into surgery.  Leave suitcase in the car. After surgery it may be brought to your room.     Patients discharged the day of surgery will not be allowed to drive home.  Name and phone number of your driver:spouse tom cell [phone]  Special Instructions: N/A              Please read over the following fact sheets you were given: _____________________________________________________________________             [facility] - Preparing for Surgery Before surgery, you can play an important role.  Because skin is not sterile, your skin needs to be as free of germs as possible.  You can reduce the number of germs on your skin by washing with CHG (chlorahexidine gluconate) soap before surgery.  CHG is an antiseptic cleaner which kills germs and bonds with the skin to continue killing germs even after washing. Please DO NOT use if you have an allergy to CHG or antibacterial soaps.  If your skin becomes reddened/irritated stop using the CHG and inform your nurse when  you arrive at Short Stay. Do not shave (including legs and underarms) for at least 48 hours prior to the first CHG shower.  You may shave your face/neck. Please follow these instructions carefully:  1.  Shower with CHG Soap the night before surgery and the  morning of Surgery.  2.  If you choose to wash your hair, wash your hair first as usual with your  normal  shampoo.  3.  After you shampoo, rinse your hair and body thoroughly to remove the  shampoo.                           4.  Use CHG as you would any other liquid soap.  You can apply chg directly  to the skin and wash                       Gently with a scrungie or  clean washcloth.  5.  Apply the CHG Soap to your body ONLY FROM THE NECK DOWN.   Do not use on face/ open                           Wound or open sores. Avoid contact with eyes, ears mouth and genitals (private parts).                       Wash face,  Genitals (private parts) with your normal soap.             6.  Wash thoroughly, paying special attention to the area where your surgery  will be performed.  7.  Thoroughly rinse your body with warm water from the neck down.  8.  DO NOT shower/wash with your normal soap after using and rinsing off  the CHG Soap.                9.  Pat yourself dry with a clean towel.            10.  Wear clean pajamas.            11.  Place clean sheets on your bed the night of your first shower and do not  sleep with pets. Day of Surgery : Do not apply any lotions/deodorants the morning of surgery.  Please wear clean clothes to the hospital/surgery center.  FAILURE TO FOLLOW THESE INSTRUCTIONS MAY RESULT IN THE CANCELLATION OF YOUR SURGERY PATIENT SIGNATURE_________________________________  NURSE SIGNATURE__________________________________  ________________________________________________________________________

## 2015-07-25 DIAGNOSIS — M25562 Pain in left knee: Secondary | ICD-10-CM | POA: Diagnosis not present

## 2015-07-25 DIAGNOSIS — M25561 Pain in right knee: Secondary | ICD-10-CM | POA: Diagnosis not present

## 2015-07-26 ENCOUNTER — Encounter (HOSPITAL_COMMUNITY)
Admission: RE | Admit: 2015-07-26 | Discharge: 2015-07-26 | Disposition: A | Discharge status: Home / Self Care | Payer: Medicare Other | Source: Ambulatory Visit | Attending: Urology | Admitting: Urology

## 2015-07-26 ENCOUNTER — Encounter (HOSPITAL_COMMUNITY): Payer: Self-pay

## 2015-07-26 DIAGNOSIS — Z01818 Encounter for other preprocedural examination: Secondary | ICD-10-CM | POA: Insufficient documentation

## 2015-07-26 LAB — CBC
HEMATOCRIT: 36.1 % (ref 36.0–46.0)
Hemoglobin: 12 g/dL (ref 12.0–15.0)
MCH: 32.7 pg (ref 26.0–34.0)
MCHC: 33.2 g/dL (ref 30.0–36.0)
MCV: 98.4 fL (ref 78.0–100.0)
PLATELETS: 231 10*3/uL (ref 150–400)
RBC: 3.67 MIL/uL — ABNORMAL LOW (ref 3.87–5.11)
RDW: 12.9 % (ref 11.5–15.5)
WBC: 7.8 10*3/uL (ref 4.0–10.5)

## 2015-07-26 LAB — BASIC METABOLIC PANEL
Anion gap: 10 (ref 5–15)
BUN: 22 mg/dL — AB (ref 6–20)
CHLORIDE: 104 mmol/L (ref 101–111)
CO2: 29 mmol/L (ref 22–32)
CREATININE: 0.75 mg/dL (ref 0.44–1.00)
Calcium: 9.4 mg/dL (ref 8.9–10.3)
GFR calc Af Amer: >60 mL/min (ref >60)
GFR calc non Af Amer: >60 mL/min (ref >60)
GLUCOSE: 106 mg/dL — AB (ref 65–99)
Potassium: 4.4 mmol/L (ref 3.5–5.1)
SODIUM: 143 mmol/L (ref 135–145)

## 2015-07-26 NOTE — H&P (Signed)
Chief Complaint Periurethral mass   History of Present Illness Dorothy Rojas is a 69 year old female with a history of stress incontinence seen today at the request of Dr. Nicki Reaper MacDiarmid. She presents today after having undergone multiple macroplastique injections for stress incontinence by Dr. Matilde Sprang. Her most recent injection was 04/12/15 after which she developed recurrent incontinence back to baseline fairly rapidly within a few weeks. Due to a 14 pound weight loss, she underwent CT imaging by her gastroenterologist. This was performed on 05/02/15 and demonstrated a questionable urethral mass. The patient had denied hematuria or new voiding symptoms and specifically denied obstruction voiding symptoms. Dr. Matilde Sprang performed a pelvic exam that was unremarkable and performed cystoscopy and did not see any masses within the urinary tract. A follow up CT scan on 06/14/15 demonstrated a stable 3.7 cm enhancing solid right periurethral mass raising a question of a urethral malignancy possibly related to a urethral diverticulum. There was no inguinal or pelvic lymphadenopathy or other evidence of metastatic disease. A prior CT scan from June 2015 did not demonstrate any abnormality in the periurethral area.    She presents today for further evaluation and assessment regarding the concern of a urethral/periurethral malignancy. She states that she has not had any worsening voiding symptoms aside from her baseline incontinence and denies any hematuria. She denies pelvic pain.   Past Medical History Problems  1. History of deep venous thrombosis (Z86.718) 2. History of factor V Leiden mutation (Z86.2) 3. History of hypercholesterolemia (Z86.39) 4. History of hypertension (Z86.79) 5. History of Murmur (R01.1)  Surgical History Problems  1. History of Anterior Colporrhaphy, Repair Of Cystocele 2. History of Elbow Surgery 3. History of Endoscopic Injection Of Implant Material Into Bladder Neck 4.  History of Endoscopic Injection Of Implant Material Into Bladder Neck 5. History of Hysterectomy  Current Meds 1. Aspirin 81 MG Oral Tablet Chewable;  Therapy: (Recorded:04Feb2016) to Recorded 2. Calcium-D TABS;  Therapy: (Recorded:12Dec2007) to Recorded 3. Lisinopril-Hydrochlorothiazide TABS;  Therapy: (Recorded:03Sep2008) to Recorded 4. Simvastatin 40 MG Oral Tablet;  Therapy: (Recorded:19Apr2011) to Recorded 5. Trimethoprim 100 MG Oral Tablet; 100 mg daily;  Therapy: 14Sep2016 to (Last Rx:14Sep2016)  Requested for: 14Sep2016 Ordered 6. Vitamin E TABS;  Therapy: (Recorded:04Feb2016) to Recorded  Allergies Medication  1. Naproxen TABS  Family History Problems  1. Family history of Acute Myocardial Infarction : Father 2. Family history of Cirrhosis : Mother 3. Family history of Death In The Family Father   Deceased at age 15; MI 58. Family history of Death In The Family Mother   Deceased at age 20; Cirrhosis 5. Family history of Family Health Status Number Of Children   2 sons and 1 daughter  Social History Problems    Activities Of Daily Living   Alcohol Use (History)   2x/week   Denied: Caffeine Use   Exercise Habits   She indicates she is exercising 5 or 6 times a day utilizing cardio, yoga and stretching     techniques.   Former smoker [phone])   Living Independently With Spouse   Occupation:   bookkeeper   Self-reliant In Usual Daily Activities   History of Tobacco Use   smoked 1ppd for 21 yrs & quit about 1985  Review of Systems  Genitourinary: no hematuria.  Constitutional: recent 16 lb weight loss.  Cardiovascular: no leg swelling.    Vitals Vital Signs [Data Includes: Last 1 Day]  Recorded: 29Nov2016 02:35PM  Blood Pressure: 93 / 56 Heart Rate: 65  Physical  Exam Constitutional: Well nourished and well developed . No acute distress.  ENT:. The ears and nose are normal in appearance.  Neck: The appearance of the neck is normal and  no neck mass is present.  Pulmonary: No respiratory distress and normal respiratory rhythm and effort.  Cardiovascular: Heart rate and rhythm are normal . No peripheral edema.  Abdomen: The abdomen is soft and nontender. No masses are palpated. No CVA tenderness. No hernias are palpable. No hepatosplenomegaly noted.  Genitourinary:. On vaginal exam, there is a small mass that can be palpated toward the right anterior vaginal wall and to the right of the urethra. Although this feels firmer in consistency compared to the rest of the anterior vagina, the mass is smooth and does not feel highly suspicious for an obvious malignancy. This is mildly tender on exam. This area measures approximately 1.5-2 cm in largest diameter. This mass is completely underneath the vaginal epithelium without any extension beyond the vaginal epithelium.  Chaperone Present: Esau Grew.  Examination of the external genitalia shows normal female external genitalia and no lesions. The urethra is normal in appearance and not tender. Vaginal exam demonstrates no abnormalities. The adnexa are palpably normal. The bladder is non tender and not distended. The anus is normal on inspection. The perineum is normal on inspection.  Lymphatics: The femoral and inguinal nodes are not enlarged or tender.  Skin: Normal skin turgor, no visible rash and no visible skin lesions.  Neuro/Psych:. Mood and affect are appropriate.    Results/Data Urine [Data Includes: Last 1 Day]   [id number]  COLOR YELLOW   APPEARANCE CLEAR   SPECIFIC GRAVITY >1.030   pH 5.5   GLUCOSE NEGATIVE   BILIRUBIN NEGATIVE   KETONE NEGATIVE   BLOOD 2+   PROTEIN NEGATIVE   NITRITE NEGATIVE   LEUKOCYTE ESTERASE NEGATIVE   SQUAMOUS EPITHELIAL/HPF 0-5 HPF  WBC NONE SEEN WBC/HPF  RBC 0-2 RBC/HPF  BACTERIA NONE SEEN HPF  CRYSTALS NONE SEEN HPF  CASTS NONE SEEN LPF  Yeast NONE SEEN HPF  Selected Results  AU CT-HEMATURIA PROTOCOL Dorothy Rojas:[id number] 12:00AM MacDiarmid, Nicki Reaper    Test Name Result Flag Reference  AU CT-HEMATURIA PROTOCOL (Report)    ** RADIOLOGY REPORT BY [facility] **   CLINICAL DATA: Inferior bladder/urethral mass on recent CT, possibly inflammatory after urethral injection therapy. Reportedly normal cystoscopy. Follow-up mass. Microhematuria. No acute symptoms are reported.  EXAM: CT ABDOMEN AND PELVIS WITHOUT AND WITH CONTRAST  TECHNIQUE: Multidetector CT imaging of the abdomen and pelvis was performed following the standard protocol before and following the bolus administration of intravenous contrast.  CONTRAST: 125 cc Isovue-300 IV.  COMPARISON: 05/02/2015 CT abdomen/ pelvis.  FINDINGS: Lower chest: No significant pulmonary nodules or acute consolidative airspace disease.  Hepatobiliary: Stable 2.4 cm hemangioma in anterior segment 4A left liver lobe. Otherwise normal liver, with no new liver lesions. Normal gallbladder with no radiopaque cholelithiasis. No biliary ductal dilatation.  Pancreas: Normal, with no mass or duct dilation.  Spleen: Normal size. No mass.  Adrenals/Urinary Tract: Normal adrenals. No hydronephrosis. No nephrolithiasis. Normal caliber ureters, with no ureteral stones. No renal cortical masses. On delayed imaging, there is no urothelial wall thickening and there are no filling defects in the opacified portions of the bilateral collecting systems or ureters. There is a persistent solid enhancing lobular mass centered in the right anterior periurethral region and measuring approximately 3.7 x 3.6 cm (series 4/ image 81), previously measuring 3.7 x 3.6 cm on 05/02/2015 using similar  measurement technique, not appreciably changed, with the continued suggestion of encasement of the urethra by the mass. There is stable mass-effect on the bladder base by the periurethral mass. The bladder is otherwise unremarkable.  Stomach/Bowel: Grossly normal stomach. Normal caliber small bowel with no  small bowel wall thickening. The appendix is not discretely visualized. Moderate sigmoid diverticulosis. No large bowel wall thickening or pericolonic fat stranding.  Vascular/Lymphatic: Atherosclerotic nonaneurysmal abdominal aorta. Patent portal, splenic, hepatic and renal veins. No pathologically enlarged lymph nodes in the abdomen or pelvis.  Reproductive: Status post hysterectomy, with no abnormal findings at the vaginal cuff. No adnexal mass.  Other: No pneumoperitoneum, ascites or focal fluid collection.  Musculoskeletal: No aggressive appearing focal osseous lesions.  IMPRESSION: 1. 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 Tissue sampling is advised. 2. No evidence of metastatic disease in the abdomen or pelvis. 3. Moderate sigmoid diverticulosis.   Electronically Signed  By: Ilona Sorrel M.D.  On: 06/14/2015 12:20    I have independently reviewed her recent CT scan as well as compared this scan to her CT scan from September 2016 and her baseline scan from June 2015. Findings are as outlined above.  Procedure  Procedure: Cystoscopy  Chaperone Present: Nira Conn S.  Indication: Possible urethral/bladder mass.  Informed Consent: Risks, benefits, and potential adverse events were discussed and informed consent was obtained from the patient.  Prep: The patient was prepped with betadine.  Anesthesia:. Local anesthesia was administered intraurethrally with 2% lidocaine jelly.  Antibiotic prophylaxis: Ciprofloxacin.  Procedure Note:  Urethral meatus:. No abnormalities.  Anterior urethra: No abnormalities.  Bladder: Visulization was clear. The ureteral orifices were in the normal anatomic position bilaterally and had clear efflux of urine. On systematic examination of the bladder, no bladder tumors, stones, or other mucosal lesions are  identified. On retroflexion of the scope, the bladder neck could be well examined and there was noted to be a submucosal old gene noted at the bladder neck particularly on the right side which likely is related to her known periurethral mass. This does not appear to be originating from the urothelial lining. The patient tolerated the procedure well.  Complications: None.    Assessment Assessed  1. Mass of urethra (N36.8)  Plan Health Maintenance  1. UA With REFLEX; [Do Not Release]; Status:Complete;   DoneAP:[id number] 02:10PM Mass of urethra  2. Cysto; Status:Hold For - Appointment,Date of Service; Requested for:29Nov2016;  3. Pelvic Exam; Status:Hold For - Manual Activation; Requested for:29Nov2016;  4. Follow-up Office  Follow-up  Status: Hold For - Date of Service  Requested for:  29Nov2016  Discussion/Summary 1. Periurethral mass: I have very low suspicion that she has a malignancy considering the mass noted on her CT scan and considering the history. The most likely cause for her imaging findings would be Macroplastique material in the periurethral tissues. There is no documentation of what Macroplastique looks like on CT imaging. She understands that this is the most likely explanation for these findings and we discussed options for further evaluation. I did give her the option of proceeding with further imaging such as follow-up axial imaging with either CT or MR versus a transvaginal ultrasound to determine whether there may be blood flow present in this lesion. I gave her an alternative option of proceeding with a transvaginal biopsy under anesthesia to provide more definitive evaluation. After a detailed discussion, she is quite  anxious and concerned about this periurethral mass and adamantly wishes to proceed with a biopsy. We will therefore plan to proceed with a scheduled transvaginal biopsy of this periurethral mass under anesthesia. I will plan to have ultrasound available for  ultrasound guidance. We discussed the potential risks, complications, and expected recovery process. She expresses her understanding and gives informed consent to proceed.    Assuming that she is confirmed to not have a malignancy by biopsy, she understands that I would still recommend follow-up imaging in a few months to ensure that this mass is decreasing in size which would likely be expected if this is Macroplastique.    Cc: Dr. Nicki Reaper MacDiarmid   A total of 40 minutes were spent in the overall care of the patient today with 25 minutes in direct face to face consultation.    Signatures Electronically signed by : Raynelle Bring, M.D.; Jul 10 2015  5:59PM EST

## 2015-07-30 ENCOUNTER — Ambulatory Visit (HOSPITAL_COMMUNITY)
Admission: RE | Admit: 2015-07-30 | Discharge: 2015-07-30 | Disposition: A | Discharge status: Home / Self Care | Payer: Medicare Other | Source: Ambulatory Visit | Attending: Urology | Admitting: Urology

## 2015-07-30 ENCOUNTER — Ambulatory Visit (HOSPITAL_COMMUNITY): Payer: Medicare Other | Admitting: Anesthesiology

## 2015-07-30 ENCOUNTER — Encounter (HOSPITAL_COMMUNITY): Payer: Self-pay | Admitting: *Deleted

## 2015-07-30 ENCOUNTER — Encounter (HOSPITAL_COMMUNITY)
Admission: RE | Disposition: A | Discharge status: Home / Self Care | Payer: Self-pay | Source: Ambulatory Visit | Attending: Urology

## 2015-07-30 DIAGNOSIS — Z7982 Long term (current) use of aspirin: Secondary | ICD-10-CM | POA: Diagnosis not present

## 2015-07-30 DIAGNOSIS — K219 Gastro-esophageal reflux disease without esophagitis: Secondary | ICD-10-CM | POA: Insufficient documentation

## 2015-07-30 DIAGNOSIS — Z86718 Personal history of other venous thrombosis and embolism: Secondary | ICD-10-CM | POA: Insufficient documentation

## 2015-07-30 DIAGNOSIS — Z87891 Personal history of nicotine dependence: Secondary | ICD-10-CM | POA: Insufficient documentation

## 2015-07-30 DIAGNOSIS — D6851 Activated protein C resistance: Secondary | ICD-10-CM | POA: Insufficient documentation

## 2015-07-30 DIAGNOSIS — I10 Essential (primary) hypertension: Secondary | ICD-10-CM | POA: Insufficient documentation

## 2015-07-30 DIAGNOSIS — N393 Stress incontinence (female) (male): Secondary | ICD-10-CM | POA: Insufficient documentation

## 2015-07-30 DIAGNOSIS — N368 Other specified disorders of urethra: Secondary | ICD-10-CM | POA: Insufficient documentation

## 2015-07-30 DIAGNOSIS — Z79899 Other long term (current) drug therapy: Secondary | ICD-10-CM | POA: Insufficient documentation

## 2015-07-30 DIAGNOSIS — E7800 Pure hypercholesterolemia, unspecified: Secondary | ICD-10-CM | POA: Insufficient documentation

## 2015-07-30 DIAGNOSIS — D4959 Neoplasm of unspecified behavior of other genitourinary organ: Secondary | ICD-10-CM | POA: Diagnosis not present

## 2015-07-30 DIAGNOSIS — Z792 Long term (current) use of antibiotics: Secondary | ICD-10-CM | POA: Insufficient documentation

## 2015-07-30 DIAGNOSIS — N369 Urethral disorder, unspecified: Secondary | ICD-10-CM | POA: Diagnosis not present

## 2015-07-30 HISTORY — PX: PUBOVAGINAL SLING: SHX1035

## 2015-07-30 SURGERY — CREATION, PUBOVAGINAL SLING
Anesthesia: General

## 2015-07-30 MED ORDER — PROPOFOL 10 MG/ML IV BOLUS
INTRAVENOUS | Status: AC
  Filled 2015-07-30: qty 20

## 2015-07-30 MED ORDER — HYDROCODONE-ACETAMINOPHEN 5-325 MG PO TABS: 1.0000 | ORAL_TABLET | Freq: Four times a day (QID) | ORAL | Status: DC | PRN

## 2015-07-30 MED ORDER — MIDAZOLAM HCL 2 MG/2ML IJ SOLN
INTRAMUSCULAR | Status: AC
  Filled 2015-07-30: qty 2

## 2015-07-30 MED ORDER — PHENYLEPHRINE 40 MCG/ML (10ML) SYRINGE FOR IV PUSH (FOR BLOOD PRESSURE SUPPORT)
PREFILLED_SYRINGE | INTRAVENOUS | Status: AC
  Filled 2015-07-30: qty 10

## 2015-07-30 MED ORDER — DEXAMETHASONE SODIUM PHOSPHATE 10 MG/ML IJ SOLN
INTRAMUSCULAR | Status: DC | PRN
  Administered 2015-07-30: 10 mg via INTRAVENOUS

## 2015-07-30 MED ORDER — MIDAZOLAM HCL 5 MG/5ML IJ SOLN
INTRAMUSCULAR | Status: DC | PRN
  Administered 2015-07-30: 2 mg via INTRAVENOUS

## 2015-07-30 MED ORDER — FENTANYL CITRATE (PF) 100 MCG/2ML IJ SOLN
INTRAMUSCULAR | Status: AC
  Filled 2015-07-30: qty 2

## 2015-07-30 MED ORDER — DEXAMETHASONE SODIUM PHOSPHATE 10 MG/ML IJ SOLN
INTRAMUSCULAR | Status: AC
  Filled 2015-07-30: qty 1

## 2015-07-30 MED ORDER — EPHEDRINE SULFATE 50 MG/ML IJ SOLN
INTRAMUSCULAR | Status: DC | PRN
  Administered 2015-07-30: 10 mg via INTRAVENOUS

## 2015-07-30 MED ORDER — LIDOCAINE HCL (CARDIAC) 20 MG/ML IV SOLN
INTRAVENOUS | Status: AC
  Filled 2015-07-30: qty 10

## 2015-07-30 MED ORDER — ONDANSETRON HCL 4 MG/2ML IJ SOLN
INTRAMUSCULAR | Status: AC
  Filled 2015-07-30: qty 2

## 2015-07-30 MED ORDER — CIPROFLOXACIN IN D5W 400 MG/200ML IV SOLN
INTRAVENOUS | Status: DC | PRN
  Administered 2015-07-30: 400 mg via INTRAVENOUS

## 2015-07-30 MED ORDER — FENTANYL CITRATE (PF) 100 MCG/2ML IJ SOLN
INTRAMUSCULAR | Status: DC | PRN
  Administered 2015-07-30 (×2): 50 ug via INTRAVENOUS

## 2015-07-30 MED ORDER — ROCURONIUM BROMIDE 100 MG/10ML IV SOLN
INTRAVENOUS | Status: AC
  Filled 2015-07-30: qty 1

## 2015-07-30 MED ORDER — LIDOCAINE HCL (CARDIAC) 20 MG/ML IV SOLN
INTRAVENOUS | Status: DC | PRN
  Administered 2015-07-30: 50 mg via INTRAVENOUS

## 2015-07-30 MED ORDER — LACTATED RINGERS IV SOLN: INTRAVENOUS | Status: DC

## 2015-07-30 MED ORDER — PROPOFOL 10 MG/ML IV BOLUS
INTRAVENOUS | Status: DC | PRN
  Administered 2015-07-30: 150 mg via INTRAVENOUS

## 2015-07-30 MED ORDER — LACTATED RINGERS IV SOLN
INTRAVENOUS | Status: DC
  Administered 2015-07-30: 10:00:00 via INTRAVENOUS
  Administered 2015-07-30: 1000 mL via INTRAVENOUS

## 2015-07-30 MED ORDER — FENTANYL CITRATE (PF) 100 MCG/2ML IJ SOLN: 25.0000 ug | INTRAMUSCULAR | Status: DC | PRN

## 2015-07-30 MED ORDER — CIPROFLOXACIN IN D5W 400 MG/200ML IV SOLN
INTRAVENOUS | Status: AC
  Filled 2015-07-30: qty 200

## 2015-07-30 MED ORDER — ONDANSETRON HCL 4 MG/2ML IJ SOLN
INTRAMUSCULAR | Status: DC | PRN
  Administered 2015-07-30: 4 mg via INTRAVENOUS

## 2015-07-30 SURGICAL SUPPLY — 39 items
APL SKNCLS STERI-STRIP NONHPOA (GAUZE/BANDAGES/DRESSINGS)
BAG URINE DRAINAGE (UROLOGICAL SUPPLIES) IMPLANT
BAG URO CATCHER STRL LF (MISCELLANEOUS) ×3 IMPLANT
BENZOIN TINCTURE PRP APPL 2/3 (GAUZE/BANDAGES/DRESSINGS) IMPLANT
BLADE HEX COATED 2.75 (ELECTRODE) IMPLANT
BLADE SURG 15 STRL LF DISP TIS (BLADE) ×1 IMPLANT
BLADE SURG 15 STRL SS (BLADE) ×3
CATH FOLEY 2WAY SLVR  5CC 16FR (CATHETERS) ×2
CATH FOLEY 2WAY SLVR 5CC 16FR (CATHETERS) ×1 IMPLANT
CLOSURE WOUND 1/2 X4 (GAUZE/BANDAGES/DRESSINGS)
COVER SURGICAL LIGHT HANDLE (MISCELLANEOUS) ×3 IMPLANT
DECANTER SPIKE VIAL GLASS SM (MISCELLANEOUS) ×3 IMPLANT
ELECT PENCIL ROCKER SW 15FT (MISCELLANEOUS) IMPLANT
ELECT REM PT RETURN 9FT ADLT (ELECTROSURGICAL)
ELECTRODE REM PT RTRN 9FT ADLT (ELECTROSURGICAL) IMPLANT
GAUZE SPONGE 4X4 16PLY XRAY LF (GAUZE/BANDAGES/DRESSINGS) ×3 IMPLANT
GLOVE BIOGEL M STRL SZ7.5 (GLOVE) ×3 IMPLANT
GOWN STRL REUS W/TWL LRG LVL3 (GOWN DISPOSABLE) ×3 IMPLANT
KIT BASIN OR (CUSTOM PROCEDURE TRAY) ×3 IMPLANT
LIQUID BAND (GAUZE/BANDAGES/DRESSINGS) IMPLANT
MANIFOLD NEPTUNE II (INSTRUMENTS) ×3 IMPLANT
NEEDLE HYPO 22GX1.5 SAFETY (NEEDLE) ×3 IMPLANT
NS IRRIG 1000ML POUR BTL (IV SOLUTION) IMPLANT
PACK CYSTO (CUSTOM PROCEDURE TRAY) ×3 IMPLANT
PACKING VAGINAL (PACKING) ×2 IMPLANT
PLUG CATH AND CAP STER (CATHETERS) ×3 IMPLANT
SLING MONARC C TRANSOBTURATOR (UROLOGICAL SUPPLIES) IMPLANT
STAPLER VISISTAT 35W (STAPLE) IMPLANT
STRIP CLOSURE SKIN 1/2X4 (GAUZE/BANDAGES/DRESSINGS) IMPLANT
SUT CHROMIC 2 0 SH (SUTURE) IMPLANT
SUT ETHILON 3 0 PS 1 (SUTURE) IMPLANT
SUT PROLENE 0 CT 2 (SUTURE) IMPLANT
SUT SILK 2 0 30  PSL (SUTURE)
SUT SILK 2 0 30 PSL (SUTURE) IMPLANT
SUT VIC AB 2-0 UR5 27 (SUTURE) ×6 IMPLANT
SUT VIC AB 4-0 SH 27 (SUTURE)
SUT VIC AB 4-0 SH 27XBRD (SUTURE) IMPLANT
SYRINGE 10CC LL (SYRINGE) ×3 IMPLANT
YANKAUER SUCT BULB TIP 10FT TU (MISCELLANEOUS) ×3 IMPLANT

## 2015-07-30 NOTE — Op Note (Signed)
Preoperative diagnosis:  1. Periurethral mass  Postoperative diagnosis: 1. Periurethral mass  Procedure(s): 1. Pelvic exam under anesthesia 2. Transvaginal biopsy of periurethral mass under transvaginal ultrasound guidance 3. Transvaginal ultrasound of periurethral mass  Surgeon: Dr. Roxy Horseman, Jr  Anesthesia: General  Complications: None  EBL: Minimal  Specimens: 1. Ultrasound guided biopsies of periurethral mass 2. Digital guided biopsies of periurethral mass  Disposition of specimens: Pathology  Intraoperative findings: Transvaginal ultrasound of the periurethral mass demonstrated an isoechoic mass protruding extrinsically to the bladder on the left side of the bladder neck measuring 1.6 x 1.3 cm.  Indication: Ms. Dorothy Rojas is a 69 year old female who was radiographically found to have an incidental left sided periurethral mass that raised concern for a possible urethral malignancy.  However, cystoscopic evaluation was unremarkable and she has had no symptoms.  She also has recently undergone two periurethral injections of bulking agent for stress incontinence and it was felt that this likely contributes to her imaging findings.  However, this mass was persistently present after repeat imaging.  She therefore presents for a definitive biopsy today.  I discussed the potential benefits and risks of the procedure, side effects of the proposed treatment, the likelihood of the patient achieving the goals of the procedure, and any potential problems that might occur during the procedure or recuperation.  She gives her informed consent.  Description of procedure:  The patient was taken to the operating room, prepped in the usual sterile fashion after being placed in the dorsal lithotomy position.  She was administered preoperative antibiotics and general anesthesia.  A preoperative time out was performed.  Next, I performed a pelvic exam under anesthesia.  This revealed a firm but  still slightly mobile mass just to the left of the bladder neck at the anterior vaginal wall.  This mass was clearly below the vaginal mucosa with no visual abnormalities noted. I then performed transvaginal ultrasound of this mass.  Considering her small introitus, I used a transrectal probe and there was an isoechoic mass noted in the area of concern to the left of the bladder neck with findings as dictated above.  I then used ultrasound guidance to direct the spring loaded biopsy into the tissue of concern and took two separate biopsies.  I then removed the probe and took an additional two sample with the biopsy gun under direct digital palpation.  There was minimal bleeding after the procedure.  A betadine soaked vaginal packing was inserted into the vagina.  The patient tolerated the procedure well and without complications.  She was able to be awakened and transferred to the PACU in stable condition.

## 2015-07-30 NOTE — Anesthesia Preprocedure Evaluation (Signed)
Anesthesia Evaluation  Patient identified by MRN, date of birth, ID band Patient awake    Reviewed: Allergy & Precautions, NPO status , Patient's Chart, lab work & pertinent test results  Airway Mallampati: II  TM Distance: >3 FB Neck ROM: Full    Dental no notable dental hx. (+) Dental Advisory Given, Teeth Intact   Pulmonary neg pulmonary ROS, former smoker,    Pulmonary exam normal breath sounds clear to auscultation       Cardiovascular hypertension, Pt. on medications + DVT  Normal cardiovascular exam Rhythm:Regular Rate:Normal  History of DVT of lower extremity  dx 07-28-2013 left lower extrem. -- xarelto for 6 months     Neuro/Psych Carotid stenosis negative neurological ROS  negative psych ROS   GI/Hepatic Neg liver ROS, GERD  Medicated,  Endo/Other  negative endocrine ROS  Renal/GU negative Renal ROS  negative genitourinary   Musculoskeletal negative musculoskeletal ROS (+)   Abdominal   Peds negative pediatric ROS (+)  Hematology negative hematology ROS (+) Heterozygous factor V Leiden mutation dx 07/2013  hematologist-- dr Bernadene Bell ([facility])-- per note "Low Risk"     Anesthesia Other Findings   Reproductive/Obstetrics negative OB ROS                             Anesthesia Physical Anesthesia Plan  ASA: III  Anesthesia Plan: General   Post-op Pain Management:    Induction: Intravenous  Airway Management Planned: LMA  Additional Equipment:   Intra-op Plan:   Post-operative Plan:   Informed Consent:   Plan Discussed with: Surgeon  Anesthesia Plan Comments:         Anesthesia Quick Evaluation

## 2015-07-30 NOTE — Discharge Instructions (Addendum)
1. Please remove vaginal packing this evening.  You may experience some blood spotting or minor vaginal bleeding and may want to wear a pad for a day or two.  You may also experience some blood in the urine temporarily. 2.  Avoid aspirin or anti-inflammatory medications until blood spotting has stopped and then you may resume. 3.  Please call if fever > 101 or uncontrolled pain.

## 2015-07-30 NOTE — Interval H&P Note (Signed)
History and Physical Interval Note:  07/30/2015 8:05 AM  Dorothy Rojas  has presented today for surgery, with the diagnosis of PERIURETHRAL MASS  The various methods of treatment have been discussed with the patient and family. After consideration of risks, benefits and other options for treatment, the patient has consented to  Procedure(s) with comments: PELVIC EXAM UNDER ANESTHESIA, TRANSVAGINAL BIOPSY OF PERIURETHRAL MASS (N/A) - 45 MINS      as a surgical intervention .  The patient's history has been reviewed, patient examined, no change in status, stable for surgery.  I have reviewed the patient's chart and labs.  Questions were answered to the patient's satisfaction.     Shloimy Michalski,LES

## 2015-07-30 NOTE — Anesthesia Postprocedure Evaluation (Signed)
Anesthesia Post Note  Patient: Dorothy Rojas  Procedure(s) Performed: Procedure(s) (LRB): PELVIC EXAM UNDER ANESTHESIA, TRANSVAGINAL BIOPSY OF PERIURETHRAL MASS (N/A)  Patient location during evaluation: PACU Anesthesia Type: General Level of consciousness: awake and alert Pain management: pain level controlled Vital Signs Assessment: post-procedure vital signs reviewed and stable Respiratory status: spontaneous breathing, nonlabored ventilation, respiratory function stable and patient connected to nasal cannula oxygen Cardiovascular status: blood pressure returned to baseline and stable Postop Assessment: no signs of nausea or vomiting Anesthetic complications: no    Last Vitals:  Filed Vitals:   07/30/15 1117 07/30/15 1220  BP: 121/66 124/68  Pulse: 74 76  Temp:  36.7 C  Resp: 14 16    Last Pain:  Filed Vitals:   07/30/15 1221  PainSc: 0-No pain                 Kyona Chauncey L

## 2015-07-30 NOTE — Transfer of Care (Signed)
Immediate Anesthesia Transfer of Care Note  Patient: Dorothy Rojas  Procedure(s) Performed: Procedure(s) with comments: PELVIC EXAM UNDER ANESTHESIA, TRANSVAGINAL BIOPSY OF PERIURETHRAL MASS (N/A) - 48 MINS       Patient Location: PACU  Anesthesia Type:General  Level of Consciousness:  sedated, patient cooperative and responds to stimulation  Airway & Oxygen Therapy:Patient Spontanous Breathing and Patient connected to face mask oxgen  Post-op Assessment:  Report given to PACU RN and Post -op Vital signs reviewed and stable  Post vital signs:  Reviewed and stable  Last Vitals:  Filed Vitals:   07/30/15 0639  BP: 143/63  Pulse: 63  Temp: 36.6 C  Resp: 18    Complications: No apparent anesthesia complications

## 2015-07-31 ENCOUNTER — Encounter (HOSPITAL_COMMUNITY): Payer: Self-pay | Admitting: Urology

## 2015-08-01 ENCOUNTER — Encounter: Payer: Self-pay | Admitting: Family Medicine

## 2015-08-12 DIAGNOSIS — M810 Age-related osteoporosis without current pathological fracture: Secondary | ICD-10-CM

## 2015-08-12 HISTORY — DX: Age-related osteoporosis without current pathological fracture: M81.0

## 2015-09-19 DIAGNOSIS — L821 Other seborrheic keratosis: Secondary | ICD-10-CM | POA: Diagnosis not present

## 2015-09-19 DIAGNOSIS — D485 Neoplasm of uncertain behavior of skin: Secondary | ICD-10-CM | POA: Diagnosis not present

## 2015-09-19 DIAGNOSIS — L820 Inflamed seborrheic keratosis: Secondary | ICD-10-CM | POA: Diagnosis not present

## 2015-09-27 ENCOUNTER — Other Ambulatory Visit: Payer: Self-pay | Admitting: Family Medicine

## 2015-10-26 ENCOUNTER — Other Ambulatory Visit: Payer: Self-pay | Admitting: Family Medicine

## 2015-10-28 ENCOUNTER — Other Ambulatory Visit: Payer: Self-pay | Admitting: Family Medicine

## 2015-11-01 ENCOUNTER — Other Ambulatory Visit: Payer: Self-pay

## 2015-11-01 DIAGNOSIS — Z1231 Encounter for screening mammogram for malignant neoplasm of breast: Secondary | ICD-10-CM

## 2015-11-07 DIAGNOSIS — M1712 Unilateral primary osteoarthritis, left knee: Secondary | ICD-10-CM | POA: Diagnosis not present

## 2015-11-07 DIAGNOSIS — M1711 Unilateral primary osteoarthritis, right knee: Secondary | ICD-10-CM | POA: Diagnosis not present

## 2015-11-07 DIAGNOSIS — M25562 Pain in left knee: Secondary | ICD-10-CM | POA: Diagnosis not present

## 2015-11-08 ENCOUNTER — Encounter: Payer: Self-pay | Admitting: Family Medicine

## 2015-11-08 ENCOUNTER — Ambulatory Visit (INDEPENDENT_AMBULATORY_CARE_PROVIDER_SITE_OTHER): Payer: Medicare Other | Admitting: Family Medicine

## 2015-11-08 VITALS — BP 120/72 | HR 68 | Ht 64.5 in | Wt 123.2 lb

## 2015-11-08 DIAGNOSIS — D6851 Activated protein C resistance: Secondary | ICD-10-CM

## 2015-11-08 DIAGNOSIS — I10 Essential (primary) hypertension: Secondary | ICD-10-CM | POA: Diagnosis not present

## 2015-11-08 DIAGNOSIS — M8580 Other specified disorders of bone density and structure, unspecified site: Secondary | ICD-10-CM | POA: Diagnosis not present

## 2015-11-08 DIAGNOSIS — Z862 Personal history of diseases of the blood and blood-forming organs and certain disorders involving the immune mechanism: Secondary | ICD-10-CM | POA: Diagnosis not present

## 2015-11-08 DIAGNOSIS — Z1159 Encounter for screening for other viral diseases: Secondary | ICD-10-CM | POA: Diagnosis not present

## 2015-11-08 DIAGNOSIS — R7301 Impaired fasting glucose: Secondary | ICD-10-CM | POA: Diagnosis not present

## 2015-11-08 DIAGNOSIS — D126 Benign neoplasm of colon, unspecified: Secondary | ICD-10-CM | POA: Diagnosis not present

## 2015-11-08 DIAGNOSIS — Z0000 Encounter for general adult medical examination without abnormal findings: Secondary | ICD-10-CM

## 2015-11-08 DIAGNOSIS — K219 Gastro-esophageal reflux disease without esophagitis: Secondary | ICD-10-CM

## 2015-11-08 DIAGNOSIS — E7800 Pure hypercholesterolemia, unspecified: Secondary | ICD-10-CM | POA: Diagnosis not present

## 2015-11-08 DIAGNOSIS — Z5181 Encounter for therapeutic drug level monitoring: Secondary | ICD-10-CM

## 2015-11-08 DIAGNOSIS — I6523 Occlusion and stenosis of bilateral carotid arteries: Secondary | ICD-10-CM | POA: Diagnosis not present

## 2015-11-08 DIAGNOSIS — E042 Nontoxic multinodular goiter: Secondary | ICD-10-CM

## 2015-11-08 DIAGNOSIS — K5900 Constipation, unspecified: Secondary | ICD-10-CM

## 2015-11-08 DIAGNOSIS — N3281 Overactive bladder: Secondary | ICD-10-CM

## 2015-11-08 LAB — LIPID PANEL
CHOL/HDL RATIO: 2.4 ratio (ref <=5.0)
CHOLESTEROL: 151 mg/dL (ref 125–200)
HDL: 64 mg/dL (ref >=46)
LDL CALC: 65 mg/dL (ref <130)
TRIGLYCERIDES: 112 mg/dL (ref <150)
VLDL: 22 mg/dL (ref <30)

## 2015-11-08 LAB — CBC WITH DIFFERENTIAL/PLATELET
Basophils Absolute: 0 10*3/uL (ref 0.0–0.1)
Basophils Relative: 0 % (ref 0–1)
EOS ABS: 0.2 10*3/uL (ref 0.0–0.7)
Eosinophils Relative: 2 % (ref 0–5)
HEMATOCRIT: 36.2 % (ref 36.0–46.0)
Hemoglobin: 12.5 g/dL (ref 12.0–15.0)
LYMPHS ABS: 2.9 10*3/uL (ref 0.7–4.0)
LYMPHS PCT: 37 % (ref 12–46)
MCH: 34.1 pg — AB (ref 26.0–34.0)
MCHC: 34.5 g/dL (ref 30.0–36.0)
MCV: 98.6 fL (ref 78.0–100.0)
MONOS PCT: 8 % (ref 3–12)
MPV: 10.2 fL (ref 8.6–12.4)
Monocytes Absolute: 0.6 10*3/uL (ref 0.1–1.0)
NEUTROS PCT: 53 % (ref 43–77)
Neutro Abs: 4.2 10*3/uL (ref 1.7–7.7)
PLATELETS: 223 10*3/uL (ref 150–400)
RBC: 3.67 MIL/uL — ABNORMAL LOW (ref 3.87–5.11)
RDW: 14 % (ref 11.5–15.5)
WBC: 7.9 10*3/uL (ref 4.0–10.5)

## 2015-11-08 LAB — COMPREHENSIVE METABOLIC PANEL
ALT: 18 U/L (ref 6–29)
AST: 17 U/L (ref 10–35)
Albumin: 4.5 g/dL (ref 3.6–5.1)
Alkaline Phosphatase: 56 U/L (ref 33–130)
BILIRUBIN TOTAL: 0.6 mg/dL (ref 0.2–1.2)
BUN: 14 mg/dL (ref 7–25)
CALCIUM: 9.8 mg/dL (ref 8.6–10.4)
CHLORIDE: 101 mmol/L (ref 98–110)
CO2: 24 mmol/L (ref 20–31)
CREATININE: 0.77 mg/dL (ref 0.50–0.99)
GLUCOSE: 96 mg/dL (ref 65–99)
Potassium: 4.3 mmol/L (ref 3.5–5.3)
SODIUM: 139 mmol/L (ref 135–146)
Total Protein: 7.3 g/dL (ref 6.1–8.1)

## 2015-11-08 NOTE — Patient Instructions (Signed)
  HEALTH MAINTENANCE RECOMMENDATIONS:  It is recommended that you get at least 30 minutes of aerobic exercise at least 5 days/week (for weight loss, you may need as much as 60-90 minutes). This can be any activity that gets your heart rate up. This can be divided in 10-15 minute intervals if needed, but try and build up your endurance at least once a week.  Weight bearing exercise is also recommended twice weekly.  Eat a healthy diet with lots of vegetables, fruits and fiber.  "Colorful" foods have a lot of vitamins (ie green vegetables, tomatoes, red peppers, etc).  Limit sweet tea, regular sodas and alcoholic beverages, all of which has a lot of calories and sugar.  Up to 1 alcoholic drink daily may be beneficial for women (unless trying to lose weight, watch sugars).  Drink a lot of water.  Calcium recommendations are 1200-1500 mg daily (1500 mg for postmenopausal women or women without ovaries), and vitamin D 1000 IU daily.  This should be obtained from diet and/or supplements (vitamins), and calcium should not be taken all at once, but in divided doses.  Monthly self breast exams and yearly mammograms for women over the age of 5 is recommended.  Sunscreen of at least SPF 30 should be used on all sun-exposed parts of the skin when outside between the hours of 10 am and 4 pm (not just when at beach or pool, but even with exercise, golf, tennis, and yard work!)  Use a sunscreen that says "broad spectrum" so it covers both UVA and UVB rays, and make sure to reapply every 1-2 hours.  Remember to change the batteries in your smoke detectors when changing your clock times in the spring and fall.  Use your seat belt every time you are in a car, and please drive safely and not be distracted with cell phones and texting while driving.    Dorothy Rojas , Thank you for taking time to come for your Medicare Wellness Visit. I appreciate your ongoing commitment to your health goals. Please review the  following plan we discussed and let me know if I can assist you in the future.   These are the goals we discussed: Goals    None      This is a list of the screening recommended for you and due dates:  Health Maintenance  Topic Date Due  .  Hepatitis C: One time screening is recommended by Center for Disease Control  (CDC) for  adults born from [birth year] through 1965.   July 11, 1946  . Flu Shot  03/11/2016  . Mammogram  11/27/2016  . Colon Cancer Screening  06/20/2020  . Tetanus Vaccine  10/31/2024  . DEXA scan (bone density measurement)  Completed  . Shingles Vaccine  Completed  . Pneumonia vaccines  Completed    You are due for a bone density test.  See if you can schedule it for May 1st with your mammogram We are checking the hepatitis C screening test today Mammogram date above is wrong--you are due in 2017  Please try and get [place] a copy of your Living will and healthcare power of attorney documents at your convenience.  Add in weight-bearing exercise.

## 2015-11-08 NOTE — Progress Notes (Signed)
Chief Complaint  Patient presents with  . Medicare Wellness    fasting annual wellness with pelvic exam. Has had some issues with vertigo again. Underneath her fingernails have been purple in color and she is curious about it, this was after flying. Says that she flew once before and had this happened.    Dorothy Rojas is a 70 y.o. female who presents for annual wellness visit and follow-up on chronic medical conditions.  She has the following concerns:  Purple finger nails noted after a flight back from [place]. This lasted a few hours in all fingers, but her right ring finger persisted for a day. Denies fingers being cold. Was not painful. Happened one other time when flying to [place]. Denies h/o Raynaud's or other symptoms.  Hypertension follow-up: Blood pressures elsewhere are 115-120's/60's-70's. Denies dizziness, chest pain. Denies side effects of medications. Denies headaches. She denies exertional chest pain.  She gets intermittent mild vertigo.  On 3/4 she woke up and balance was worse--felt nauseated, balance was off, and she slept most of the day. Only slight residual the following day, and was able to fly to [facility] and was fine during her trip, just minimal symptoms when bending forward, which is what she has had in the past. She does have some slight swelling in her ankles if she is on her feet a lot, by the end of the day, never present when she first wakes up.  BP was 115/57 at Dr. Aurea Graff office yesterday.  She got a cortisone shot to her left knee (gets every 3-4 months; eventually will need partial knee replacement).  GERD: Has been taking OTC Nexium. She hasn't had any further chest pain (started taking PPI after ER visit for chest pain).She hasn't had heartburn in a long time, but has been taking nexium (OTC) daily.  She is asking if she needs to continue to take this every day.  She hasn't missed any doses to know if she has recurrent symptoms, but she was told that she has a  hiatal hernia at her EGD in November (no mention of [name] on report from EGD).  Hematuria--previously underwent eval by Dr. McDiarmid with normal cystoscopy. She had CT which showed some cysts--he felt all was okay. Bladder is colonized with bacteria, and will always show bacteria in urine, but also has still had some intermittent infections, the last time was in June. She was just prescribed Trimethoprim to take once daily for UTI prevention, and hasn't had any infections. Got second Macroplastique procedure in September, and ultimately needed periurethral biopsy for abnormal finding on CT.  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 She had good results from macroplastique, but only very short-lived with the second procedure (only lasted 1-2 weeks).  She reports that her urinary leakage actually improved significantly in early February.  Hyperlipidemia follow-up: Patient is reportedly following a low-fat, low cholesterol diet. Compliant with medications and denies medication side effects. She occasionally gets some leg cramps at night (and in the past had some that were related to the shoes she is wearing, improved after changing shoes).  H/o DVT--she completed 6 months of xarelto.She was discharged from the care of hematologist. If any recurrence, will need lifelong treatment.  She is taking '81mg'$  of ASA daily. No other blood thinning agents were recommended. Denies any bleeding/bruising.  Constipation--doing well, on stool softener once daily, and takes metamucil.  Carotid artery disease. Last u/s was 04/2013: IMPRESSION: Stable carotid artery duplex exam. Mild atherosclerotic disease in  the carotid arteries. Estimated degree of stenosis in the internal carotid arteries is less than 50% bilaterally. She hasn't had any neuro symptoms.  Immunization History  Administered Date(s) Administered  . Influenza Whole 04/09/2011  . Influenza, High Dose Seasonal PF 05/08/2014,  05/01/2015  . Influenza,inj,Quad PF,36+ Mos 04/14/2013  . Pneumococcal Conjugate-13 11/01/2014  . Pneumococcal Polysaccharide-23 04/09/2011  . Td 08/07/2006  . Tdap 11/01/2014  . Zoster 10/25/2008   Last Pap smear: can't recall. S/p hysterectomy for benign reasons  Last mammogram: 11/2014, scheduled for 12/10/15 Last colonoscopy: 06/2015 (along with EGD) with Dr. Marina Goodell. Had adenomatous polyp, due again in 2021 Last DEXA: 11/2012 at [facility] showing osteopenia, worse in spine compared to 2011. Ophtho: yearly  Dentist: regularly, with dentist and periodontist every 3-4 months  Exercise: regular--walks daily (as weather permits); not currently getting weight-bearing exercise   Other doctors caring for patient include: Dr. McDiarmid (urology)  Dr. Nile Riggs (ophtho)  Dr. Marina Goodell Dr. Lendell Caprice (dentist)  Dr. Rosie Fate (hematologist)--released from his care Dr. Charlann Boxer (ortho) Dr. Corliss Marcus (periodontist) Dr. Karlyn Agee (dermatologist)   Depression screen:  negative Fall Screen: none Functional Status screen: notable only for urinary incontinence. See epic for full screens   End of Life Discussion:  Patient has a living will and medical power of attorney  Past Medical History  Diagnosis Date  . Hypertension   . Osteopenia     last DEXA 10/2009  . Heterozygous factor V Leiden mutation [facility]) dx 07/2013      hematologist--  dr Cay Schillings ([facility])--  per note "Low Risk"  . Microscopic hematuria     chronic   . Multinodular thyroid     a. Path benign 2013.  [name] History of DVT of lower extremity     dx 07-28-2013  left lower extrem.  --  xarelto for 6 months  . SUI (stress urinary incontinence, female)   . Lymphadenopathy, inguinal     bilateral  . Prediabetes   . Chronic constipation   . Arthritis     knees  . GERD (gastroesophageal reflux disease)     normal EGD 06/2015  . Multinodular goiter   . Anemia   . OAB (overactive bladder)   . Colon polyp 06/2015     tubular adenoma (sigmoid)  . Diverticulosis     seen on colonoscopy 06/2015    Past Surgical History  Procedure Laterality Date  . Total abdominal hysterectomy w/ bilateral salpingoophorectomy  09/  2000  . Appendectomy  1973  . Ovarian cyst removal Left 1973  . Incontinence surgery  04/1999    Re-do at [facility] for sling complications in 2001  . Neuroma surgery Bilateral 1980's     feet  . Lateral epicondyle release Right   . Cystocele repair  12-03-2010  . Knee arthroscopy Right 04-05-2013  . Transthoracic echocardiogram  05-04-2014    normal ,  ef 60-65%  . Tonsillectomy  as child  . Cataract extraction w/ intraocular lens  implant, bilateral Bilateral 2015  . Cystoscopy macroplastique implant N/A 12/12/2014    Procedure: CYSTOSCOPY MACROPLASTIQUE INJECTION;  Surgeon: Alfredo Martinez, MD;  Location: [facility];  Service: Urology;  Laterality: N/A;  . Cystoscopy macroplastique implant N/A 04/12/2015    Procedure: CYSTOSCOPY MACROPLASTIQUE IMPLANT;  Surgeon: Alfredo Martinez, MD;  Location: [facility];  Service: Urology;  Laterality: N/A;  . Pubovaginal sling N/A 07/30/2015    Procedure: PELVIC EXAM UNDER ANESTHESIA, TRANSVAGINAL BIOPSY OF PERIURETHRAL MASS;  Surgeon:  Raynelle Bring, MD;  Location: [facility];  Service: Urology;  Laterality: N/A;  13 MINS     Social History   Social History  . Marital Status: Married    Spouse Name: N/A  . Number of Children: 3  . Years of Education: N/A   Occupational History  . Retired    Social History Main Topics  . Smoking status: Former Smoker -- 1.00 packs/day for 21 years    Types: Cigarettes    Quit date: 08/12/1983  . Smokeless tobacco: Never Used  . Alcohol Use: 0.0 oz/week    0 Standard drinks or equivalent per week     Comment: occasional wine  . Drug Use: No  . Sexual Activity: Not Currently   Other Topics Concern  . Not on file   Social History Narrative   Married.  Lives with husband.   Children live in [place], [place].  Youngest child was killed in [place].  3 stepchildren are all local. 1 dog (Muffin lost vision 2015 due to retinal detachment). 5 grandchildren (2 in [place], 2 from step-daughter in [place], and another in [place]).    Family History  Problem Relation Age of Onset  . Hypertension Father   . Heart disease Father 64    CABG, died from MI at 47  . COPD Father   . Cirrhosis Mother     related to psoriasis medications  . Diabetes Mother   . Psoriasis Mother   . Osteogenesis imperfecta Sister   . Heart disease Sister     CHF  . Diabetes Brother   . Heart disease Brother 41    MI  . Deep vein thrombosis Brother     factor V Leiden NEGATIVE  . Cancer Neg Hx     Outpatient Encounter Prescriptions as of 11/08/2015  Medication Sig Note  . Calcium Citrate-Vitamin D (CALCIUM + D PO) Take 1 tablet by mouth daily.   [name] docusate sodium (COLACE) 100 MG capsule Take 100 mg by mouth daily.    [name] esomeprazole (NEXIUM) 20 MG capsule Take 20 mg by mouth every evening.   [name] lisinopril-hydrochlorothiazide (PRINZIDE,ZESTORETIC) 20-12.5 MG tablet TAKE 1 TABLET ONCE DAILY.   . simvastatin (ZOCOR) 40 MG tablet TAKE ONE TABLET AT BEDTIME.   [name] trimethoprim (TRIMPEX) 100 MG tablet Take 100 mg by mouth daily. 05/01/2015: Per Dr. McDiarmid for UTI prevention, started 04/2015  . [DISCONTINUED] HYDROcodone-acetaminophen (NORCO/VICODIN) 5-325 MG tablet Take 1-2 tablets by mouth every 6 (six) hours as needed.    No facility-administered encounter medications on file as of 11/08/2015.    Allergies  Allergen Reactions  . Naprosyn [Naproxen] Rash    ROS:The patient denies anorexia, fever, weight changes (no further/ongoing loss, some recent gain with vacation), headaches, vision changes, decreased hearing, ear pain, sore throat, breast concerns, chest pain, palpitations, syncope, dyspnea on exertion, cough, swelling, nausea, vomiting, diarrhea, abdominal pain, melena, hematochezia, hematuria, dysuria,  vaginal bleeding, discharge, odor or itch, genital lesions, numbness, tingling, weakness, tremor, suspicious skin lesions, depression, anxiety, abnormal bleeding or enlarged lymph nodes.  L knee pain, getting cortisone shots per HPI with good relief. Constipation--controlled with metamucil and stool softeners Some sweats at night (between breasts), tolerable. No hot flashes Intermittent vertigo, usually only when looking down, short-lived. Urinary incontinence much better x 6 weeks. Occasional early awakening (but goes to bed early), sometimes she can't get back to sleep.    PHYSICAL EXAM:  BP 120/72 mmHg  Pulse 68  Ht 5' 4.5" (1.638 m)  Wt 123 lb 3.2 oz (55.883 kg)  BMI 20.83 kg/m2  General Appearance:  Alert, cooperative, no distress, appears stated age   Head:  Normocephalic, without obvious abnormality, atraumatic   Eyes:  PERRL, conjunctiva/corneas clear, EOM's intact, fundi  benign   Ears:  Normal TM's and external ear canals   Nose:  Nares normal, no drainage or sinus tenderness; Nasal mucosa mildly edematous  Throat:  Lips, mucosa, and tongue normal; teeth and gums normal   Neck:  Supple, no lymphadenopathy; thyroid: no enlargement/tenderness/nodules; no carotid  bruit or JVD   Back:  Spine nontender, no curvature, ROM normal, no CVA tenderness.   Lungs:  Clear to auscultation bilaterally without wheezes, rales or ronchi; respirations unlabored   Chest Wall:  No tenderness or deformity   Heart:  Regular rate and rhythm, S1 and S2 normal, no murmur, rub  or gallop   Breast Exam:  No tenderness, masses, or nipple discharge. Nipples are inverted bilaterally (chronic, per pt). No axillary lymphadenopathy. Slight fibroglandular changes with tenderness on left outer breast  Abdomen:  Soft, non-tender, nondistended, normoactive bowel sounds,  no masses, no hepatosplenomegaly   Genitalia:  Normal external genitalia without  lesions. BUS and vagina normal; Uterus and adnexa surgically absent. No masses palpable   Rectal:  Normal tone, no masses or tenderness; guaiac-negative stool.  Slightly diminished sphincter tone  Extremities:  No clubbing, cyanosis or edema. No cords, no edema.  Pulses:  2+ and symmetric all extremities   Skin:  Skin color, texture, turgor normal, no rashes or suspicious lesions. Many SK's.  Area of erythema at left of midline in mid back--area of healing from recent removal of SK  Lymph nodes:  Cervical, supraclavicular, and axillary nodes normal   Neurologic:  CNII-XII intact, normal strength, sensation and gait; reflexes 2+ and symmetric throughout    Psych:   Normal mood, affect, hygiene and grooming             ASSESSMENT/PLAN:  Medicare annual wellness visit, subsequent  Essential hypertension, benign - well controlled - Plan: Comprehensive metabolic panel  Pure hypercholesterolemia - Plan: Lipid panel  Multinodular goiter  Carotid stenosis, bilateral - stable per last check in 2014, <50%. Recheck in 6 mos  OAB (overactive bladder) - improved  Osteopenia - due for repeat DEXA  Heterozygous factor V Leiden mutation ([place]) - continue ASA '81mg'$   Gastroesophageal reflux disease without esophagitis - well controlled; questions answered  Impaired fasting glucose - Plan: Hemoglobin A1c  Tubular adenoma of colon - repeat colonoscopy in 5 yrs - Plan: CBC with Differential/Platelet  Constipation, unspecified constipation type - chronic, controlled  History of anemia - Plan: CBC with Differential/Platelet  Medication monitoring encounter - Plan: Comprehensive metabolic panel, CBC with Differential/Platelet, Lipid panel  Need for hepatitis C screening test - Plan: Hepatitis C antibody   Discussed monthly self breast exams and yearly mammograms; at least 30 minutes of aerobic activity at least 5 days/week and weight-bearing  exercise 2x/week; proper sunscreen use reviewed; healthy diet, including goals of calcium and vitamin D intake and alcohol recommendations (less than or equal to 1 drink/day) reviewed; regular seatbelt use; changing batteries in smoke detectors. Immunization recommendations discussed, UTD; continue yearly flu shots. Colonoscopy recommendations reviewed, due in 2021   Schedule f/u carotid artery u/s for 04/2016, prior to her next med check here.  Osteopenia--needs to schedule DEXA.  GERD and hiatal hernia.  Discussed nexium (risks of taking med, of untreated reflux, and if needs daily).  Can  try every other day, restart daily if any recurrent symptoms.  Discussed possible prn use, but may not do well with that if [facility] is large/symptomatic.   Bone density was due 11/2014, ordered last year, doesn't look like it was scheduled  Full code, Full Care  A1c, c-met, lipid, hep C  CBC (11.7 in 02/2015, normal in December, at 12.0))  Medicare Attestation I have personally reviewed: The patient's medical and social history Their use of alcohol, tobacco or illicit drugs Their current medications and supplements The patient's functional ability including ADLs,fall risks, home safety risks, cognitive, and hearing and visual impairment Diet and physical activities Evidence for depression or mood disorders  The patient's weight, height, and BMI have been recorded in the chart.  I have made referrals, counseling, and provided education to the patient based on review of the above and I have provided the patient with a written personalized care plan for preventive services.     Larisha Vencill A, MD   11/08/2015

## 2015-11-09 LAB — HEPATITIS C ANTIBODY: HCV Ab: NEGATIVE

## 2015-11-09 LAB — HEMOGLOBIN A1C
Hgb A1c MFr Bld: 5.8 % — ABNORMAL HIGH (ref <5.7)
Mean Plasma Glucose: 120 mg/dL

## 2015-11-15 DIAGNOSIS — H524 Presbyopia: Secondary | ICD-10-CM | POA: Diagnosis not present

## 2015-11-15 DIAGNOSIS — Z961 Presence of intraocular lens: Secondary | ICD-10-CM | POA: Diagnosis not present

## 2015-12-10 ENCOUNTER — Ambulatory Visit
Admission: RE | Admit: 2015-12-10 | Discharge: 2015-12-10 | Disposition: A | Discharge status: Home / Self Care | Payer: Medicare Other | Source: Ambulatory Visit

## 2015-12-10 ENCOUNTER — Ambulatory Visit
Admission: RE | Admit: 2015-12-10 | Discharge: 2015-12-10 | Disposition: A | Discharge status: Home / Self Care | Payer: Medicare Other | Source: Ambulatory Visit | Attending: Family Medicine | Admitting: Family Medicine

## 2015-12-10 DIAGNOSIS — M810 Age-related osteoporosis without current pathological fracture: Secondary | ICD-10-CM

## 2015-12-10 DIAGNOSIS — Z780 Asymptomatic menopausal state: Secondary | ICD-10-CM | POA: Diagnosis not present

## 2015-12-10 DIAGNOSIS — Z1231 Encounter for screening mammogram for malignant neoplasm of breast: Secondary | ICD-10-CM | POA: Diagnosis not present

## 2015-12-10 DIAGNOSIS — M8580 Other specified disorders of bone density and structure, unspecified site: Secondary | ICD-10-CM

## 2015-12-13 DIAGNOSIS — M810 Age-related osteoporosis without current pathological fracture: Secondary | ICD-10-CM | POA: Insufficient documentation

## 2015-12-17 ENCOUNTER — Encounter: Payer: Self-pay | Admitting: Family Medicine

## 2015-12-17 ENCOUNTER — Ambulatory Visit (INDEPENDENT_AMBULATORY_CARE_PROVIDER_SITE_OTHER): Payer: Medicare Other | Admitting: Family Medicine

## 2015-12-17 ENCOUNTER — Other Ambulatory Visit: Payer: Self-pay | Admitting: Family Medicine

## 2015-12-17 VITALS — BP 138/80 | HR 64 | Ht 64.5 in | Wt 126.4 lb

## 2015-12-17 DIAGNOSIS — R252 Cramp and spasm: Secondary | ICD-10-CM | POA: Diagnosis not present

## 2015-12-17 DIAGNOSIS — M810 Age-related osteoporosis without current pathological fracture: Secondary | ICD-10-CM | POA: Diagnosis not present

## 2015-12-17 DIAGNOSIS — R7301 Impaired fasting glucose: Secondary | ICD-10-CM | POA: Diagnosis not present

## 2015-12-17 DIAGNOSIS — E042 Nontoxic multinodular goiter: Secondary | ICD-10-CM

## 2015-12-17 DIAGNOSIS — E7800 Pure hypercholesterolemia, unspecified: Secondary | ICD-10-CM

## 2015-12-17 DIAGNOSIS — Z5181 Encounter for therapeutic drug level monitoring: Secondary | ICD-10-CM | POA: Diagnosis not present

## 2015-12-17 DIAGNOSIS — I6523 Occlusion and stenosis of bilateral carotid arteries: Secondary | ICD-10-CM | POA: Diagnosis not present

## 2015-12-17 MED ORDER — IBANDRONATE SODIUM 150 MG PO TABS: ORAL_TABLET | ORAL | Status: DC

## 2015-12-17 NOTE — Patient Instructions (Addendum)
Osteoporosis Osteoporosis is the thinning and loss of density in the bones. Osteoporosis makes the bones more brittle, fragile, and likely to break (fracture). Over time, osteoporosis can cause the bones to become so weak that they fracture after a simple fall. The bones most likely to fracture are the bones in the hip, wrist, and spine. CAUSES  The exact cause is not known. RISK FACTORS Anyone can develop osteoporosis. You may be at greater risk if you have a family history of the condition or have poor nutrition. You may also have a higher risk if you are:   Female.   70 years old or older.  A smoker.  Not physically active.   White or Asian.  Slender. SIGNS AND SYMPTOMS  A fracture might be the first sign of the disease, especially if it results from a fall or injury that would not usually cause a bone to break. Other signs and symptoms include:   Low back and neck pain.  Stooped posture.  Height loss. DIAGNOSIS  To make a diagnosis, your health care provider may:  Take a medical history.  Perform a physical exam.  Order tests, such as:  A bone mineral density test.  A dual-energy X-ray absorptiometry test. TREATMENT  The goal of osteoporosis treatment is to strengthen your bones to reduce your risk of a fracture. Treatment may involve:  Making lifestyle changes, such as:  Eating a diet rich in calcium.  Doing weight-bearing and muscle-strengthening exercises.  Stopping tobacco use.  Limiting alcohol intake.  Taking medicine to slow the process of bone loss or to increase bone density.  Monitoring your levels of calcium and vitamin D. HOME CARE INSTRUCTIONS  Include calcium and vitamin D in your diet. Calcium is important for bone health, and vitamin D helps the body absorb calcium.  Perform weight-bearing and muscle-strengthening exercises as directed by your health care provider.  Do not use any tobacco products, including cigarettes, chewing  tobacco, and electronic cigarettes. If you need help quitting, ask your health care provider.  Limit your alcohol intake.  Take medicines only as directed by your health care provider.  Keep all follow-up visits as directed by your health care provider. This is important.  Take precautions at home to lower your risk of falling, such as:  Keeping rooms well lit and clutter free.  Installing safety rails on stairs.  Using rubber mats in the bathroom and other areas that are often wet or slippery. SEEK IMMEDIATE MEDICAL CARE IF:  You fall or injure yourself.    This information is not intended to replace advice given to you by your health care provider. Make sure you discuss any questions you have with your health care provider.   Document Released: 05/07/2005 Document Revised: 08/18/2014 Document Reviewed: 01/05/2014 Elsevier Interactive Patient Education 2016 [place].   Leg cramps--drink more water; consider potassium-rich foods such as bananas, and magnesium supplement if not improving.

## 2015-12-17 NOTE — Progress Notes (Signed)
Chief Complaint  Patient presents with  . Follow-up    here to go over treatment options for osteoporosis, DEXA done 12/10/15. Has med check scheduled for Oct would like to come in for labs before. Needs future labs ordered and will schedule lab appt when she checks out.    Patient presents to discuss osteoporosis and medications. She has never taken any medications for osteoporosis in the past.  She has h/o hiatal hernia, but denies dysphagia, and heartburn is controlled with OTC Nexium.  She complains of leg cramps at night, toes drawing in, since trip to [place] a couple of weeks ago; improving some since returning home.   PMH, [facility] reviewed.  Current Outpatient Prescriptions on File Prior to Visit  Medication Sig Dispense Refill  . Calcium Citrate-Vitamin D (CALCIUM + D PO) Take 1 tablet by mouth daily.    [name] docusate sodium (COLACE) 100 MG capsule Take 100 mg by mouth daily.     [name] esomeprazole (NEXIUM) 20 MG capsule Take 20 mg by mouth every evening.    [name] lisinopril-hydrochlorothiazide (PRINZIDE,ZESTORETIC) 20-12.5 MG tablet TAKE 1 TABLET ONCE DAILY. 90 tablet 0  . trimethoprim (TRIMPEX) 100 MG tablet Take 100 mg by mouth daily.     No current facility-administered medications on file prior to visit.   Allergies  Allergen Reactions  . Naprosyn [Naproxen] Rash   ROS: no fever, chills, URI symptoms, chest pain, dysphagia, GI complaints.  Mild urinary incontinence, overall improved. No dysuria. No bleeding, bruising, rash, mood changes or other complaints, except as noted in HPI (leg cramps).  PHYSICAL EXAM: BP 138/80 mmHg  Pulse 64  Ht 5' 4.5" (1.638 m)  Wt 126 lb 6.4 oz (57.335 kg)  BMI 21.37 kg/m2   Well developed, pleasant female in no distress, in good spirits Neuro: alert and oriented, cranial nerves intact, normal gait Psych: normal mood, affect, hygiene and grooming  DEXA results from 12/10/15: ASSESSMENT: The BMD measured at Femur Neck Right is 0.686 g/cm2 with a  T-score of -2.5. This patient is considered osteoporotic according to [facility]) criteria. L- 2 was excluded due to degenerative changes. There has been no statistically significant change in BMD of Lumbar spine since prior exam dated 11/15/2012.  Remainder of visit limited to discussion.  ASSESSMENT/PLAN:  Osteoporosis - Plan: ibandronate (BONIVA) 150 MG tablet, VITAMIN D 25 Hydroxy (Vit-D Deficiency, Fractures)  Pure hypercholesterolemia - Plan: Lipid panel, Hepatic function panel  Multinodular goiter - Plan: TSH  Impaired fasting glucose - Plan: Hemoglobin A1c, Glucose, random  Medication monitoring encounter - Plan: Lipid panel, Hepatic function panel  Cramp of both lower extremities - advised to drink more fluids, discussed K+ and Mg; improving    Discussed all forms of bisphosphonates, how to take, risks, and side effects. Discussed Prolia as plan B. Contraindications to Evista due to h/o DVT (factor V leiden).  Elects to start a monthly bisphosphonate.  Discussed calcium, vitamin D and weight-bearing exercise. F/u DEXA in 2 years.  F/u as scheduled in October, with fasting labs prior. Orders entered. A1c, LFT's, glucose, lipids, TSH, Vit D  25-30 min visit, more than 1/2 spent counseling.

## 2016-01-21 ENCOUNTER — Other Ambulatory Visit: Payer: Self-pay | Admitting: Family Medicine

## 2016-01-31 DIAGNOSIS — N3020 Other chronic cystitis without hematuria: Secondary | ICD-10-CM | POA: Diagnosis not present

## 2016-01-31 DIAGNOSIS — R3121 Asymptomatic microscopic hematuria: Secondary | ICD-10-CM | POA: Diagnosis not present

## 2016-01-31 DIAGNOSIS — R3129 Other microscopic hematuria: Secondary | ICD-10-CM | POA: Diagnosis not present

## 2016-02-06 DIAGNOSIS — M1712 Unilateral primary osteoarthritis, left knee: Secondary | ICD-10-CM | POA: Diagnosis not present

## 2016-02-06 DIAGNOSIS — M1711 Unilateral primary osteoarthritis, right knee: Secondary | ICD-10-CM | POA: Diagnosis not present

## 2016-03-01 ENCOUNTER — Encounter: Payer: Self-pay | Admitting: Family Medicine

## 2016-03-03 ENCOUNTER — Ambulatory Visit (INDEPENDENT_AMBULATORY_CARE_PROVIDER_SITE_OTHER): Payer: Medicare Other | Admitting: Family Medicine

## 2016-03-03 ENCOUNTER — Encounter: Payer: Self-pay | Admitting: Family Medicine

## 2016-03-03 ENCOUNTER — Ambulatory Visit
Admission: RE | Admit: 2016-03-03 | Discharge: 2016-03-03 | Disposition: A | Discharge status: Home / Self Care | Payer: Medicare Other | Source: Ambulatory Visit | Attending: Family Medicine | Admitting: Family Medicine

## 2016-03-03 VITALS — BP 98/60 | HR 70 | Ht 64.5 in | Wt 126.0 lb

## 2016-03-03 DIAGNOSIS — M546 Pain in thoracic spine: Secondary | ICD-10-CM | POA: Diagnosis not present

## 2016-03-03 DIAGNOSIS — I10 Essential (primary) hypertension: Secondary | ICD-10-CM | POA: Diagnosis not present

## 2016-03-03 DIAGNOSIS — R0602 Shortness of breath: Secondary | ICD-10-CM | POA: Diagnosis not present

## 2016-03-03 DIAGNOSIS — I6523 Occlusion and stenosis of bilateral carotid arteries: Secondary | ICD-10-CM

## 2016-03-03 DIAGNOSIS — R0609 Other forms of dyspnea: Principal | ICD-10-CM

## 2016-03-03 LAB — CBC WITH DIFFERENTIAL/PLATELET
BASOS ABS: 60 {cells}/uL (ref 0–200)
BASOS PCT: 1 %
EOS ABS: 180 {cells}/uL (ref 15–500)
EOS PCT: 3 %
HCT: 32.7 % — ABNORMAL LOW (ref 35.0–45.0)
HEMOGLOBIN: 11.1 g/dL — AB (ref 11.7–15.5)
LYMPHS ABS: 2760 {cells}/uL (ref 850–3900)
Lymphocytes Relative: 46 %
MCH: 32.8 pg (ref 27.0–33.0)
MCHC: 33.9 g/dL (ref 32.0–36.0)
MCV: 96.7 fL (ref 80.0–100.0)
MONOS PCT: 12 %
MPV: 10.3 fL (ref 7.5–12.5)
Monocytes Absolute: 720 cells/uL (ref 200–950)
NEUTROS ABS: 2280 {cells}/uL (ref 1500–7800)
Neutrophils Relative %: 38 %
PLATELETS: 235 10*3/uL (ref 140–400)
RBC: 3.38 MIL/uL — ABNORMAL LOW (ref 3.80–5.10)
RDW: 13.4 % (ref 11.0–15.0)
WBC: 6 10*3/uL (ref 4.0–10.5)

## 2016-03-03 NOTE — Progress Notes (Signed)
Chief Complaint  Patient presents with  . Shortness of Breath    SOB, no chest pain. Pain in between her shoulder blades. Tapping in her right ear from time to time, but often.    2 weeks or so ago she first noticed some shortness of breath with walking outside. She also developed discomfort between her shoulder blades. This is intermittent, but not always with exertion. Shortness of breath started with walking outside, but then she started noticing it even just with going up the stairs in her house.  While standing at her sewing table, she developed pain between the shoulder blades which made her stop sewing.  Same day she had shortness of breath with the stairs in her home.  She has been complaining of sinuses draining--unable to cough up the mucus.  Mucus from her nose is clear.  She has noticed that her blood pressures at home have been running low, a few times running in the 90's/60. She denies any lightheadedness (just very short-lived vertigo with fast position changes, which isn't changed).  2 weeks ago she started noticing a "tapping" in her right hear--sometimes fast, sometimes slow, unsure if it is the pulse or not.  It is intermittent.  She notices it when she wakes up at night, and throughout the day. Denies ear pain.  She cannot say whether or not there might be a little bit of hearing loss (in crowded places).  21 pack year h/o smoking, quit 76 +FH CAD (young age in Father) She had echo 04/2014 with normal EF, fairly normal (done for hospitalization for CP).  PMH, PSH, SH and FH reviewed  Outpatient Encounter Prescriptions as of 03/03/2016  Medication Sig Note  . Calcium Citrate-Vitamin D (CALCIUM + D PO) Take 1 tablet by mouth daily.   [name] docusate sodium (COLACE) 100 MG capsule Take 100 mg by mouth daily.    [name] esomeprazole (NEXIUM) 20 MG capsule Take 20 mg by mouth every evening.   . ibandronate (BONIVA) 150 MG tablet Take in morning once monthly, with a full glass of water, on  empty stomach, and nothing else by mouth or lie down for the next 30 min.   [name] lisinopril-hydrochlorothiazide (PRINZIDE,ZESTORETIC) 20-12.5 MG tablet TAKE 1 TABLET ONCE DAILY.   . simvastatin (ZOCOR) 40 MG tablet TAKE ONE TABLET AT BEDTIME.   [name] trimethoprim (TRIMPEX) 100 MG tablet Take 100 mg by mouth daily. 05/01/2015: Per Dr. McDiarmid for UTI prevention, started 04/2015   No facility-administered encounter medications on file as of 03/03/2016.    Allergies  Allergen Reactions  . Naprosyn [Naproxen] Rash    ROS:  Denies fever, chills, headaches.  Occasional mild vertigo (positional--with moving quickly). No chest pain ,no dysphagia. No nausea, vomiting, abdominal pain, bowel changes, bleeding, bruising. No depression, anxiety. +nasal congestion as per HPI  PHYSICAL EXAM: BP 98/60   Pulse 70   Ht 5' 4.5" (1.638 m)   Wt 126 lb (57.2 kg)   SpO2 98%   BMI 21.29 kg/m  Well appearing, pleasant female in no distress HEENT: PERRL, EOMI, conjunctiva and sclera are clear. TMs and EAC's normal. Nasal mucosa is mild-mod edematous with clear mucus; no erythema. No sinus tenderness. OP is clear Neck: no lymphadenopathy, thyromegaly or carotid bruit Heart: regula rate and rhythm without murmur Lungs: clear bilaterally. Normal air movement. No wheezes, rales, ronchi. Back: no spinal tenderness, no CVA tenderness, no muscle tenderness or spasm Extremities: no edema, 2+ pulse Neuro: alert and oriented, cranial nerves intact, normal  strength, gait Psych: normal mood, affect, hygiene and grooming Skin: normal turgor, no lesions, bruising  EKG NSR, rate 66. Normal  Spirometry--normal  ASSESSMENT/PLAN:  DOE (dyspnea on exertion) - ddx reviewed. normal spirometry and EKG; check CXR, CBC. Will need stress testing if normal - Plan: EKG 12-Lead, CBC with Differential/Platelet, DG Chest 2 View, Spirometry with Graph  Essential hypertension, benign - well controlled; a little low at times, but  asymptomatic. stay well hydrated  Midline thoracic back pain - check CXR. r/o cardiac/thoracic/aortic dz. Shown stretches in case MSK component - Plan: DG Chest 2 View   Go to [facility] for chest x-ray. We are checking blood counts and spirometry to evaluate for lung reasons or anemia as cause for the shortness of breath. The only thing on exam that I see is evidence of allergies and congestion in the nose, which can work its way down into the chest. Start taking either Claritin, Allegra or Zyrtec once daily (not the D version). Also start taking Mucinex (12 hour, plain or maximum strength kind--ingredient is guaifenesin). This is an expectorant that will help loosen any mucus or phlegm that might be causing chest congestion.  We will be in touch with your results tomorrow. If no good cause for your symptoms is found, then I will be referring you to the cardiologist for additional testing.   (husband sees Dr. [name])

## 2016-03-03 NOTE — Patient Instructions (Signed)
  Go to [place] for chest x-ray. We are checking blood counts and spirometry to evaluate for lung reasons or anemia as cause for the shortness of breath. The only thing on exam that I see is evidence of allergies and congestion in the nose, which can work its way down into the chest. Start taking either Claritin, Allegra or Zyrtec once daily (not the D version). Also start taking Mucinex (12 hour, plain or maximum strength kind--ingredient is guaifenesin). This is an expectorant that will help loosen any mucus or phlegm that might be causing chest congestion.  We will be in touch with your results tomorrow. If no good cause for your symptoms is found, then I will be referring you to the cardiologist for additional testing.

## 2016-03-04 ENCOUNTER — Ambulatory Visit
Admission: RE | Admit: 2016-03-04 | Discharge: 2016-03-04 | Disposition: A | Discharge status: Home / Self Care | Payer: Medicare Other | Source: Ambulatory Visit | Attending: Family Medicine | Admitting: Family Medicine

## 2016-03-04 ENCOUNTER — Telehealth: Payer: Self-pay | Admitting: Family Medicine

## 2016-03-04 ENCOUNTER — Other Ambulatory Visit: Payer: Medicare Other

## 2016-03-04 ENCOUNTER — Other Ambulatory Visit: Payer: Self-pay

## 2016-03-04 DIAGNOSIS — D649 Anemia, unspecified: Secondary | ICD-10-CM

## 2016-03-04 DIAGNOSIS — R0609 Other forms of dyspnea: Secondary | ICD-10-CM

## 2016-03-04 DIAGNOSIS — Z79899 Other long term (current) drug therapy: Secondary | ICD-10-CM | POA: Diagnosis not present

## 2016-03-04 DIAGNOSIS — R918 Other nonspecific abnormal finding of lung field: Secondary | ICD-10-CM | POA: Diagnosis not present

## 2016-03-04 LAB — VITAMIN B12: Vitamin B-12: 332 pg/mL (ref 200–1100)

## 2016-03-04 LAB — FERRITIN: Ferritin: 92 ng/mL (ref 20–288)

## 2016-03-04 LAB — IRON: Iron: 118 ug/dL (ref 45–160)

## 2016-03-04 LAB — FOLATE: Folate: 18.6 ng/mL (ref >5.4)

## 2016-03-04 NOTE — Telephone Encounter (Signed)
Jodie with [facility] called to make sure we saw results.

## 2016-03-04 NOTE — Telephone Encounter (Signed)
Seen by me, released to patient, and seen by patient

## 2016-03-06 NOTE — Telephone Encounter (Signed)
dt ?

## 2016-03-26 DIAGNOSIS — E7800 Pure hypercholesterolemia, unspecified: Secondary | ICD-10-CM | POA: Diagnosis not present

## 2016-03-26 DIAGNOSIS — Z8249 Family history of ischemic heart disease and other diseases of the circulatory system: Secondary | ICD-10-CM | POA: Diagnosis not present

## 2016-03-26 DIAGNOSIS — R0609 Other forms of dyspnea: Secondary | ICD-10-CM | POA: Diagnosis not present

## 2016-03-26 DIAGNOSIS — I209 Angina pectoris, unspecified: Secondary | ICD-10-CM | POA: Diagnosis not present

## 2016-03-26 IMAGING — CT CT ABD-PELV W/ CM
2 of 5 series · 16 of 46 positions shown, 18 images · IV contrast (Omnipaque 300)
Comparison: 01/17/2014

CLINICAL DATA: Weight loss, chronic constipation

EXAM:
CT ABDOMEN AND PELVIS WITH CONTRAST
TECHNIQUE: Multidetector CT imaging of the abdomen and pelvis was performed
using the standard protocol following bolus administration of
intravenous contrast.
CONTRAST:  100mL OMNIPAQUE IOHEXOL 300 MG/ML  SOLN

[Series 2: abd/ pel 5mm · axial · 0.59mm/px · z∈[-478,-78]mm · 13 of 90 slices shown, 15 images]
[im 5/90  soft-tissue]
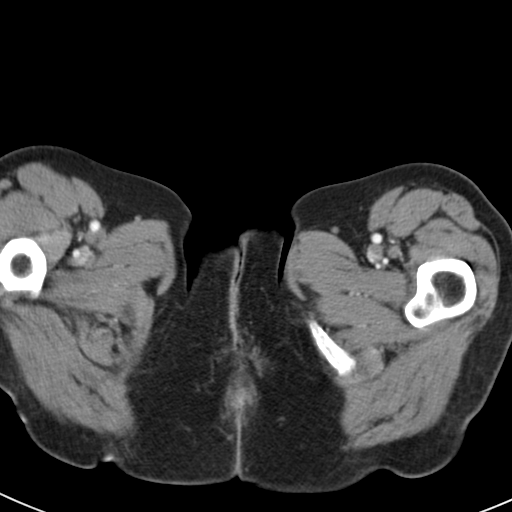
[im 5/90  bone]
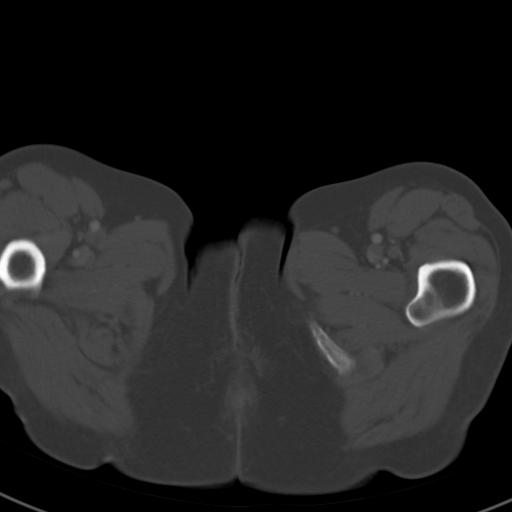
[im 14/90  soft-tissue]
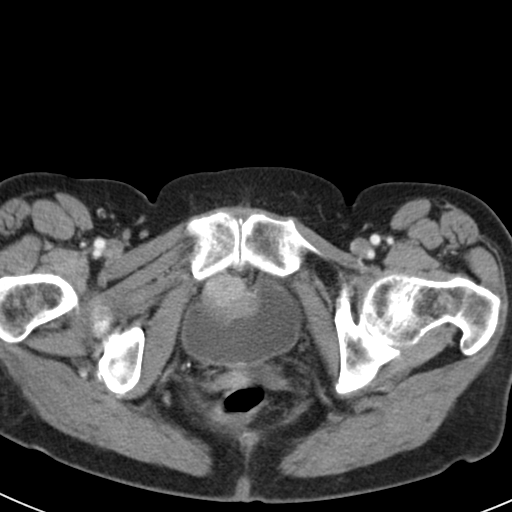
[im 18/90  soft-tissue]
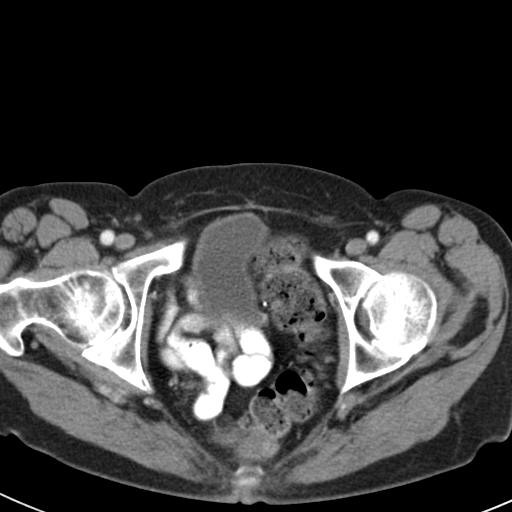
[im 27/90  soft-tissue]
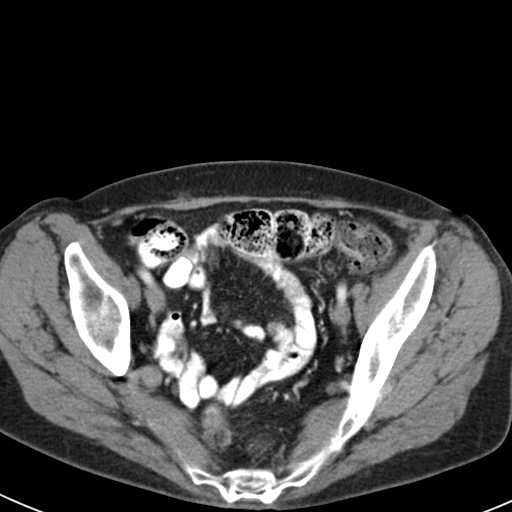
[im 32/90  soft-tissue]
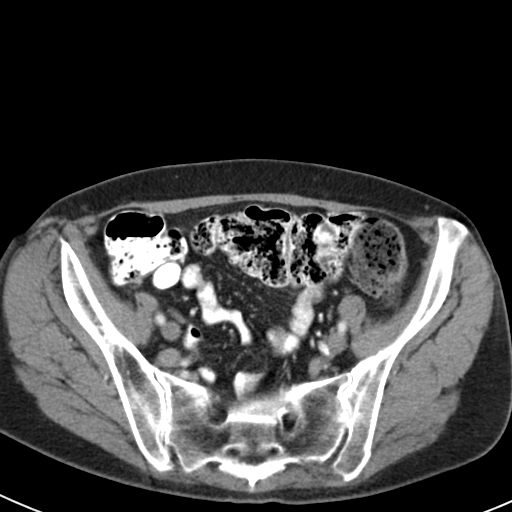
[im 41/90  soft-tissue]
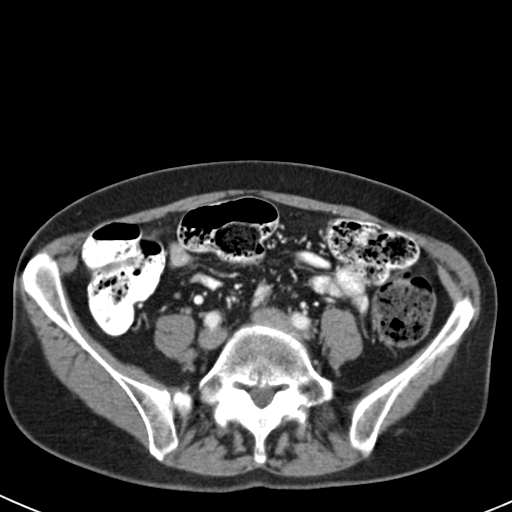
[im 45/90  soft-tissue]
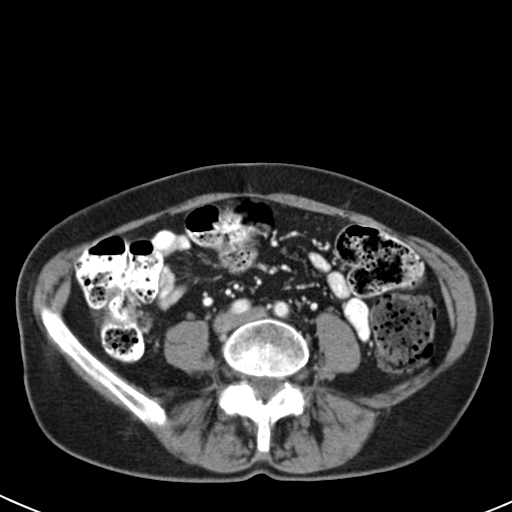
[im 49/90  soft-tissue]
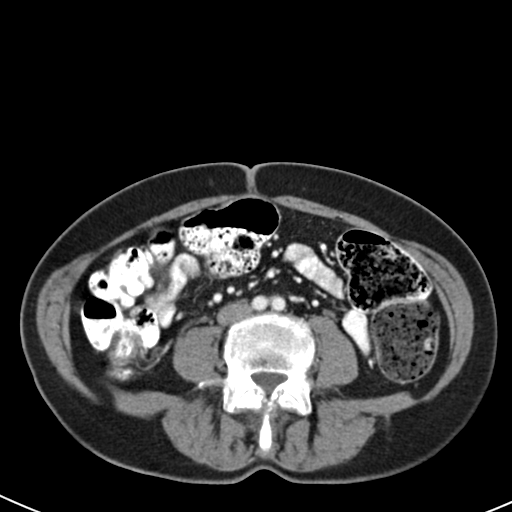
[im 58/90  soft-tissue]
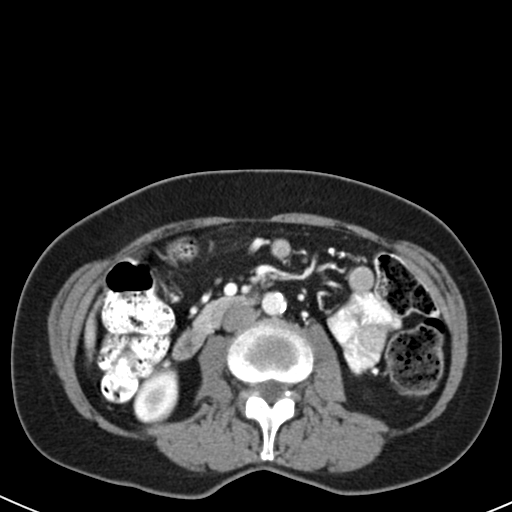
[im 58/90  bone]
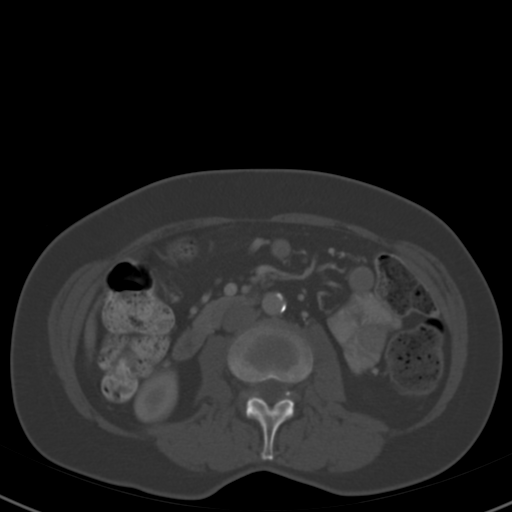
[im 63/90  soft-tissue]
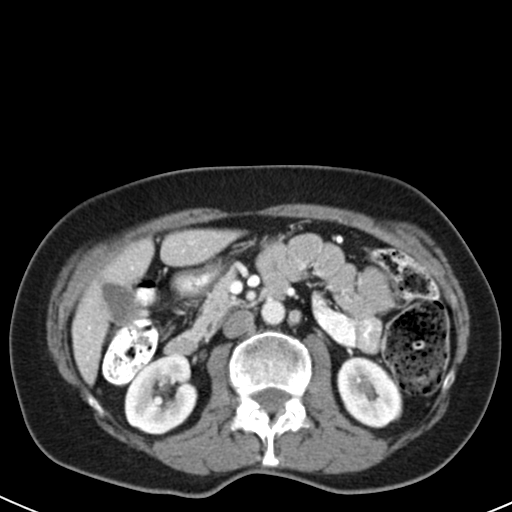
[im 72/90  soft-tissue]
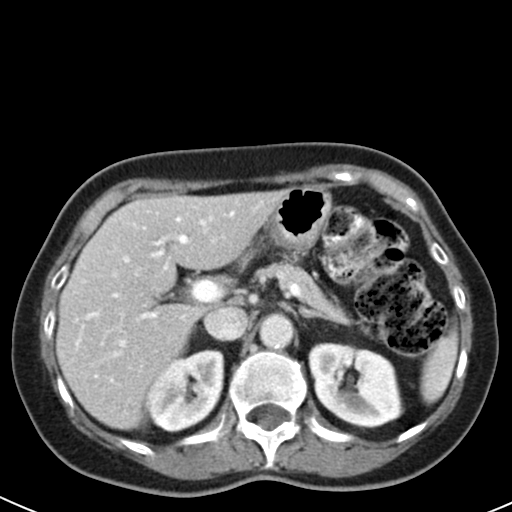
[im 76/90  soft-tissue]
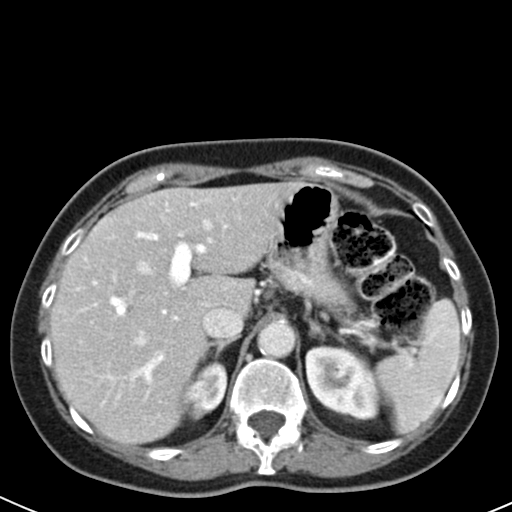
[im 85/90  soft-tissue]
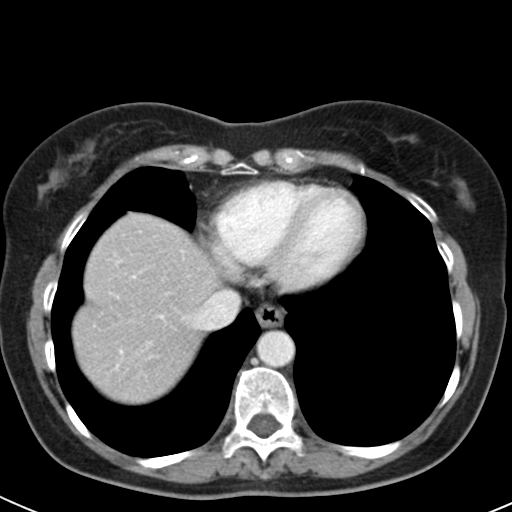

[Series 602: cor · coronal · 0.90mm/px · 3 of 102 slices shown]
[im 34/102  soft-tissue]
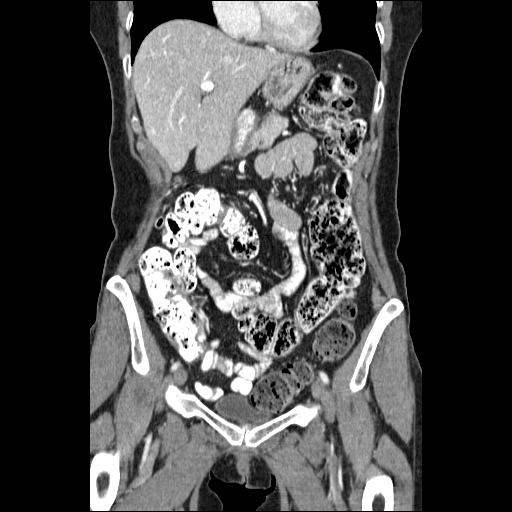
[im 45/102  soft-tissue]
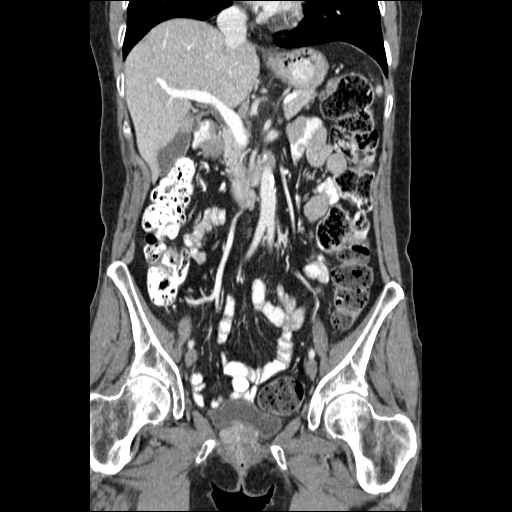
[im 57/102  soft-tissue]
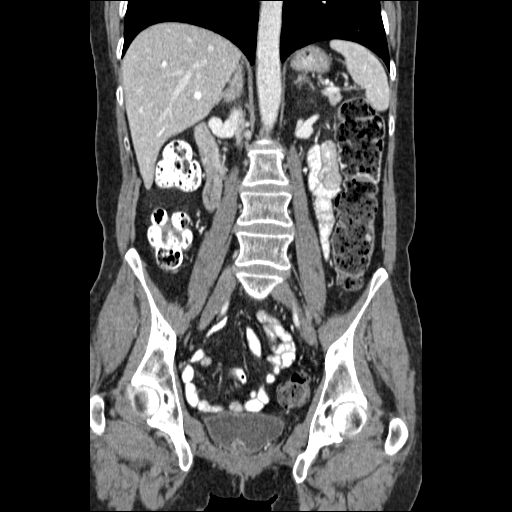

[16 of 46 positions shown; findings below may reference images not displayed]

FINDINGS: Lung bases are unremarkable. Tiny hiatal hernia. Sagittal images of
the spine are unremarkable. Mild fatty infiltration of the liver.
Again noted a hemangioma in anterior aspect of the right hepatic
lobe measures 1.8 cm.

No aortic aneurysm. No small bowel obstruction. Atherosclerotic
calcifications of abdominal aorta and iliac arteries. Abundant stool
noted throughout the colon. No pericecal inflammation. The terminal
ileum is unremarkable. The patient is status post appendectomy.
Status post hysterectomy. Abundant stool noted in rectosigmoid
colon. There is a heterogeneous mass in the region of the neck of
urinary bladder bladder base anteriorly. The lesion is encasing the
urethra highly suspicious for primary urethra neoplasm invading the
bladder posteriorly. Measures at least 3.2 by 3.4 cm. Correlation
with urology exam and cystoscopy is recommended.

Kidneys are symmetrical in size and enhancement. No hydronephrosis
or hydroureter.

Delayed renal images shows bilateral renal symmetrical excretion.
Bilateral visualized proximal ureter is unremarkable.

The pancreas, spleen and adrenal glands are unremarkable.
Degenerative changes are noted pubic symphysis. Degenerative changes
bilateral hip joints.
IMPRESSION: 1. There is a heterogeneous enhancing lesion in the region of
bladder neck invading the wall of the right anterior inferior aspect
of the urinary bladder encasing the urethra. This is highly
suspicious for primary urethra neoplasm with retrograde extension
into the bladder measures at least 3.2 x 3.4 cm. Correlation with
urology exam and cystoscopy is recommended.
2. Abundant stool throughout the colon. No small bowel or colonic
obstruction.
3. No hydronephrosis or hydroureter.
4. Status post appendectomy.  Status post hysterectomy.
5. Stable hemangioma anterior aspect of right hepatic lobe.

## 2016-03-31 DIAGNOSIS — R0602 Shortness of breath: Secondary | ICD-10-CM | POA: Diagnosis not present

## 2016-03-31 DIAGNOSIS — I200 Unstable angina: Secondary | ICD-10-CM | POA: Diagnosis not present

## 2016-03-31 DIAGNOSIS — I10 Essential (primary) hypertension: Secondary | ICD-10-CM | POA: Diagnosis not present

## 2016-03-31 DIAGNOSIS — E785 Hyperlipidemia, unspecified: Secondary | ICD-10-CM | POA: Diagnosis not present

## 2016-04-09 DIAGNOSIS — I10 Essential (primary) hypertension: Secondary | ICD-10-CM | POA: Diagnosis not present

## 2016-04-09 DIAGNOSIS — I209 Angina pectoris, unspecified: Secondary | ICD-10-CM | POA: Diagnosis not present

## 2016-04-09 DIAGNOSIS — R0602 Shortness of breath: Secondary | ICD-10-CM | POA: Diagnosis not present

## 2016-04-21 DIAGNOSIS — R0609 Other forms of dyspnea: Secondary | ICD-10-CM | POA: Diagnosis not present

## 2016-04-21 DIAGNOSIS — E7800 Pure hypercholesterolemia, unspecified: Secondary | ICD-10-CM | POA: Diagnosis not present

## 2016-04-21 DIAGNOSIS — I6523 Occlusion and stenosis of bilateral carotid arteries: Secondary | ICD-10-CM | POA: Diagnosis not present

## 2016-04-21 DIAGNOSIS — Z86718 Personal history of other venous thrombosis and embolism: Secondary | ICD-10-CM | POA: Diagnosis not present

## 2016-04-21 DIAGNOSIS — I209 Angina pectoris, unspecified: Secondary | ICD-10-CM | POA: Diagnosis not present

## 2016-04-21 DIAGNOSIS — Z8249 Family history of ischemic heart disease and other diseases of the circulatory system: Secondary | ICD-10-CM | POA: Diagnosis not present

## 2016-04-23 ENCOUNTER — Encounter: Payer: Self-pay | Admitting: Family Medicine

## 2016-04-23 DIAGNOSIS — R9439 Abnormal result of other cardiovascular function study: Secondary | ICD-10-CM | POA: Insufficient documentation

## 2016-04-25 ENCOUNTER — Ambulatory Visit
Admission: RE | Admit: 2016-04-25 | Discharge: 2016-04-25 | Disposition: A | Discharge status: Home / Self Care | Payer: Medicare Other | Source: Ambulatory Visit | Attending: Family Medicine | Admitting: Family Medicine

## 2016-04-25 DIAGNOSIS — I6523 Occlusion and stenosis of bilateral carotid arteries: Secondary | ICD-10-CM | POA: Diagnosis not present

## 2016-04-27 ENCOUNTER — Other Ambulatory Visit: Payer: Self-pay | Admitting: Family Medicine

## 2016-05-22 ENCOUNTER — Other Ambulatory Visit: Payer: Medicare Other

## 2016-05-22 DIAGNOSIS — Z5181 Encounter for therapeutic drug level monitoring: Secondary | ICD-10-CM | POA: Diagnosis not present

## 2016-05-22 DIAGNOSIS — R7301 Impaired fasting glucose: Secondary | ICD-10-CM | POA: Diagnosis not present

## 2016-05-22 DIAGNOSIS — E7800 Pure hypercholesterolemia, unspecified: Secondary | ICD-10-CM

## 2016-05-22 DIAGNOSIS — M810 Age-related osteoporosis without current pathological fracture: Secondary | ICD-10-CM | POA: Diagnosis not present

## 2016-05-22 DIAGNOSIS — E042 Nontoxic multinodular goiter: Secondary | ICD-10-CM | POA: Diagnosis not present

## 2016-05-22 LAB — LIPID PANEL
CHOL/HDL RATIO: 3 ratio (ref <=5.0)
CHOLESTEROL: 131 mg/dL (ref 125–200)
HDL: 44 mg/dL — ABNORMAL LOW (ref >=46)
LDL Cholesterol: 57 mg/dL (ref <130)
TRIGLYCERIDES: 149 mg/dL (ref <150)
VLDL: 30 mg/dL (ref <30)

## 2016-05-22 LAB — HEPATIC FUNCTION PANEL
ALBUMIN: 4.1 g/dL (ref 3.6–5.1)
ALK PHOS: 41 U/L (ref 33–130)
ALT: 25 U/L (ref 6–29)
AST: 27 U/L (ref 10–35)
BILIRUBIN DIRECT: 0.1 mg/dL (ref <=0.2)
BILIRUBIN TOTAL: 0.5 mg/dL (ref 0.2–1.2)
Indirect Bilirubin: 0.4 mg/dL (ref 0.2–1.2)
Total Protein: 6.8 g/dL (ref 6.1–8.1)

## 2016-05-22 LAB — TSH: TSH: 2.98 mIU/L

## 2016-05-22 LAB — GLUCOSE, RANDOM: Glucose, Bld: 97 mg/dL (ref 65–99)

## 2016-05-23 LAB — HEMOGLOBIN A1C
HEMOGLOBIN A1C: 5.7 % — AB (ref <5.7)
MEAN PLASMA GLUCOSE: 117 mg/dL

## 2016-05-23 LAB — VITAMIN D 25 HYDROXY (VIT D DEFICIENCY, FRACTURES): Vit D, 25-Hydroxy: 34 ng/mL (ref 30–100)

## 2016-05-24 NOTE — Progress Notes (Signed)
Chief Complaint  Patient presents with  . Hypertension    nonfasting med check, labs already done. No concerns.    DOE:  She had normal spirometry, normal CXR.  She was referred to Dr. Einar Gip, who did nuclear stress test in August 2017.  Stress EKG was + for myocardial ischemia (22mm downsloping ST seg depression, with sx of SOB). Perfusion scan--no definite ischemia (breast attenuation noted), considered a low risk scan. She is scheduled for follow-up in December, and has been asked to push herself (exert herself) in the interim.  She has been traveling, so hasn't been back to her usual walking routine.  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  Osteoporosis:  She was started on Boniva in May 2017, after her DEXA done 12/10/15 showed T -2.5 at R fem neck. She is tolerating this without side effects.  Denies dysphagia or chest pain.    Hypertension follow-up: Blood pressures elsewhere are 110-120/60's. Denies dizziness, chest pain. Denies side effects of medications. Denies headaches. She denies exertional chest pain.Denies any DOE, but hasn't been doing the activity that had been causing (walking). She gets intermittent mild vertigo (when sitting up in bed, sitting up from getting hair washed, very short-lived). She gets slight swelling in her ankles if she is on her feet a lot, by the end of the day, never present when she first wakes up.  Left knee pain: under the care of Dr. Alvan Dame.  She gets cortisone shots to her left knee every 3-4 months (states she will eventually will need partial knee replacement). She is due for follow-up.  Has some pain with certain position changes, not constant.  Hasn't been walking regularly recently.  Last injection was 3-4 months ago, incontinence recurred after the last injection. Lasted only about a day or so.   GERD: Has been taking OTC Nexium. She hasn't had any further chest pain (started taking PPI after ER visit for chest  pain).She hasn't had heartburn in a long time, but has been taking nexium (OTC) daily. She was told that she has a hiatal hernia at her EGD (no mention of [place] on report from EGD). At her visit 6 months ago we discussed trial of taking PPI every other day, and to restart daily if any recurrent symptoms.  Discussed possible prn use, but may not do well with that if [facility] is large/symptomatic.  She hasn't yet tried this yet.  Hematuria/incontinence--previously underwent eval by Dr. McDiarmid with normal cystoscopy. She had CT which showed some cysts--he felt all was okay. Bladder is colonized with bacteria, and will always show bacteria in urine, but also has still had some intermittent infections. She takes Trimethoprim once daily for UTI prevention, and hasn't had any infections since on this regimen. Got second Macroplastique procedure in September 2016, and ultimately needed periurethral biopsy for abnormal finding on CT.  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 She had good results from macroplastique, but only very short-lived with the second procedure (only lasted 1-2 weeks).  She reports that her urinary leakage actually improved significantly in early February. It has remained very well controlled since that time (except for the day of her knee cortisone shot).  Hyperlipidemia follow-up: Patient is reportedly following a low-fat, low cholesterol diet. Compliant with medications and denies medication side effects. She only rarely gets some leg cramps at night, possibly if she didn't drink enough water.  H/o DVT--she completed 6 months of xarelto.She was  discharged from the care of hematologist. If any recurrence, will need lifelong treatment.  She is taking 81mg  of ASA daily. No other blood thinning agents were recommended. Denies any bleeding/bruising. Not a candidate for Evista for osteoporosis treatment given her DVT.  Constipation--doing well, on stool softener once  daily, and takes metamucil.  MNG--denies symptoms/dysphagia.  Carotid atherosclerosis.  She recently had carotid doppler (last prior check was 2014).  Patient denies any neurologic symptoms. Carotid dopplers 04/2016: IMPRESSION: Mild atherosclerotic disease in the bilateral carotid arteries. Findings are similar to the previous examination. Estimated degree of stenosis in the internal carotid arteries is less than 50% Bilaterally.  Patent vertebral arteries with antegrade flow.  PMH, PSH, SH reviewed. No changes to [place]  Outpatient Encounter Prescriptions as of 05/26/2016  Medication Sig Note  . Calcium Citrate-Vitamin D (CALCIUM + D PO) Take 1 tablet by mouth daily.   [name] docusate sodium (COLACE) 100 MG capsule Take 100 mg by mouth daily.    [name] esomeprazole (NEXIUM) 20 MG capsule Take 20 mg by mouth every evening.   . ibandronate (BONIVA) 150 MG tablet Take in morning once monthly, with a full glass of water, on empty stomach, and nothing else by mouth or lie down for the next 30 min.   [name] lisinopril-hydrochlorothiazide (PRINZIDE,ZESTORETIC) 20-12.5 MG tablet TAKE 1 TABLET ONCE DAILY.   [name] Omega-3 Fatty Acids (FISH OIL) 1200 MG CAPS Take 1 capsule by mouth daily.   . simvastatin (ZOCOR) 40 MG tablet Take 1 tablet (40 mg total) by mouth at bedtime.   [name] trimethoprim (TRIMPEX) 100 MG tablet Take 100 mg by mouth daily. 05/01/2015: Per Dr. McDiarmid for UTI prevention, started 04/2015  . [DISCONTINUED] simvastatin (ZOCOR) 40 MG tablet TAKE ONE TABLET AT BEDTIME.    No facility-administered encounter medications on file as of 05/26/2016.    Allergies  Allergen Reactions  . Naprosyn [Naproxen] Rash    ROS: no fever, chills, headaches, dizziness, GI or GU complaints (see HPI), chest pain.  No recent DOE, see HPI.  No bleeding, bruising, rash, edema, depression, anxiety, joint pains (see HPI re: knee) or other concerns, except as per HPI.  PHYSICAL EXAM:  BP 120/68 (BP Location: Left Arm, Patient  Position: Sitting, Cuff Size: Normal)   Pulse 72   Ht 5' 4.5" (1.638 m)   Wt 129 lb 3.2 oz (58.6 kg)   BMI 21.83 kg/m   Well developed, pleasant female in no distress HEENT: PERRL, EOMI, conjunctiva clear, OP clear Neck: no lymphadenopathy, thyromegaly or carotid bruit Heart: regular rate and rhythm Lungs: clear bilaterally Back: no spinal or CVA tenderness Abdomen: soft, nontender, no organomegaly or mass Extremities: no edema, 2+ pulses Neuro: alert and oriented. Cranial nerves intact, normal strength, gait Psych: normal mood, affect, hygiene and grooming Skin: no visible bruising or rashes  Recent labs: Fasting glucose: 97  Lab Results  Component Value Date   HGBA1C 5.7 (H) 05/22/2016   Lab Results  Component Value Date   CHOL 131 05/22/2016   HDL 44 (L) 05/22/2016   LDLCALC 57 05/22/2016   TRIG 149 05/22/2016   CHOLHDL 3.0 05/22/2016   Lab Results  Component Value Date   ALT 25 05/22/2016   AST 27 05/22/2016   ALKPHOS 41 05/22/2016   BILITOT 0.5 05/22/2016   Vitamin D-OH 34  ASSESSMENT/PLAN:  Essential hypertension, benign - well controlled  Pure hypercholesterolemia - at goal; decrease in HDL noted.  Encouraged to continue current diet, medication, and resume regular walking -  Plan: simvastatin (ZOCOR) 40 MG tablet  Impaired fasting glucose - borderline/stable/improved  Multinodular goiter - asymptomatic, unchanged  Osteoporosis, unspecified osteoporosis type, unspecified pathological fracture presence - continue Boniva, calcium, D.  Consider extra D as level will likely drop some through the winter.  Need for prophylactic vaccination and inoculation against influenza - Plan: Flu vaccine HIGH DOSE PF (Fluzone High dose)   Discussed trial of taking PPI every other day, and to restart daily if any recurrent symptoms.  Discussed possible prn use, but may not do well with that if [facility] is large/symptomatic.  Consider taking a multivitamin daily or a separate  low dose vitamin D3 ([phone] IU) daily as I suspect your vitamin D levels might drop some through the winter.   Your HDL dropped significantly since you have not been walking regularly. Please resume your regular level of physical activity (at least 150 minutes of aerobic activity each week).

## 2016-05-26 ENCOUNTER — Telehealth: Payer: Self-pay | Admitting: Family Medicine

## 2016-05-26 ENCOUNTER — Encounter: Payer: Self-pay | Admitting: Family Medicine

## 2016-05-26 ENCOUNTER — Ambulatory Visit (INDEPENDENT_AMBULATORY_CARE_PROVIDER_SITE_OTHER): Payer: Medicare Other | Admitting: Family Medicine

## 2016-05-26 VITALS — BP 120/68 | HR 72 | Ht 64.5 in | Wt 129.2 lb

## 2016-05-26 DIAGNOSIS — Z23 Encounter for immunization: Secondary | ICD-10-CM

## 2016-05-26 DIAGNOSIS — Z5181 Encounter for therapeutic drug level monitoring: Secondary | ICD-10-CM

## 2016-05-26 DIAGNOSIS — E042 Nontoxic multinodular goiter: Secondary | ICD-10-CM

## 2016-05-26 DIAGNOSIS — I10 Essential (primary) hypertension: Secondary | ICD-10-CM

## 2016-05-26 DIAGNOSIS — R7301 Impaired fasting glucose: Secondary | ICD-10-CM

## 2016-05-26 DIAGNOSIS — E7800 Pure hypercholesterolemia, unspecified: Secondary | ICD-10-CM | POA: Diagnosis not present

## 2016-05-26 DIAGNOSIS — M810 Age-related osteoporosis without current pathological fracture: Secondary | ICD-10-CM | POA: Diagnosis not present

## 2016-05-26 MED ORDER — SIMVASTATIN 40 MG PO TABS: 40.0000 mg | ORAL_TABLET | Freq: Every day | ORAL | 1 refills | Status: DC

## 2016-05-26 NOTE — Telephone Encounter (Signed)
Pt made [facility] appt for 12/11/15. She would like to come in for fasting labs the week prior on on 12/05/15 for fasting labs. Is this OK?

## 2016-05-26 NOTE — Patient Instructions (Signed)
  Consider taking a multivitamin daily or a separate low dose vitamin D3 ([phone] IU) daily as I suspect your vitamin D levels might drop some through the winter.   Your HDL dropped significantly since you have not been walking regularly. Please resume your regular level of physical activity (at least 150 minutes of aerobic activity each week).  discussed trial of taking Nexium every other day, and to restart daily if any recurrent symptoms. You may eventually be able to use it just "as needed" prior to meals that you know will trigger symptoms.

## 2016-05-27 NOTE — Addendum Note (Signed)
Addended by: [name] on: 05/27/2016 08:46 PM   Modules accepted: Orders

## 2016-05-27 NOTE — Telephone Encounter (Signed)
Orders entered

## 2016-07-29 DIAGNOSIS — Z8249 Family history of ischemic heart disease and other diseases of the circulatory system: Secondary | ICD-10-CM | POA: Diagnosis not present

## 2016-07-29 DIAGNOSIS — R0609 Other forms of dyspnea: Secondary | ICD-10-CM | POA: Diagnosis not present

## 2016-07-29 DIAGNOSIS — I209 Angina pectoris, unspecified: Secondary | ICD-10-CM | POA: Diagnosis not present

## 2016-07-29 DIAGNOSIS — E7800 Pure hypercholesterolemia, unspecified: Secondary | ICD-10-CM | POA: Diagnosis not present

## 2016-07-30 ENCOUNTER — Other Ambulatory Visit: Payer: Self-pay | Admitting: Family Medicine

## 2016-08-01 DIAGNOSIS — N3942 Incontinence without sensory awareness: Secondary | ICD-10-CM | POA: Diagnosis not present

## 2016-08-01 DIAGNOSIS — N3020 Other chronic cystitis without hematuria: Secondary | ICD-10-CM | POA: Diagnosis not present

## 2016-08-01 DIAGNOSIS — N393 Stress incontinence (female) (male): Secondary | ICD-10-CM | POA: Diagnosis not present

## 2016-08-19 ENCOUNTER — Encounter: Payer: Self-pay | Admitting: Family Medicine

## 2016-09-10 ENCOUNTER — Encounter: Payer: Self-pay | Admitting: Family Medicine

## 2016-10-27 ENCOUNTER — Other Ambulatory Visit: Payer: Self-pay | Admitting: Family Medicine

## 2016-10-27 MED ORDER — LISINOPRIL-HYDROCHLOROTHIAZIDE 20-12.5 MG PO TABS: 1.0000 | ORAL_TABLET | Freq: Every day | ORAL | 0 refills | Status: DC

## 2016-10-28 ENCOUNTER — Other Ambulatory Visit: Payer: Self-pay | Admitting: Family Medicine

## 2016-10-28 DIAGNOSIS — Z1231 Encounter for screening mammogram for malignant neoplasm of breast: Secondary | ICD-10-CM

## 2016-11-11 DIAGNOSIS — M1712 Unilateral primary osteoarthritis, left knee: Secondary | ICD-10-CM | POA: Diagnosis not present

## 2016-11-20 ENCOUNTER — Ambulatory Visit (INDEPENDENT_AMBULATORY_CARE_PROVIDER_SITE_OTHER): Payer: Medicare Other | Admitting: Family Medicine

## 2016-11-20 ENCOUNTER — Encounter: Payer: Self-pay | Admitting: Family Medicine

## 2016-11-20 VITALS — BP 130/70 | HR 72 | Temp 98.9°F | Ht 64.5 in | Wt 135.4 lb

## 2016-11-20 DIAGNOSIS — Z961 Presence of intraocular lens: Secondary | ICD-10-CM | POA: Diagnosis not present

## 2016-11-20 DIAGNOSIS — S81811A Laceration without foreign body, right lower leg, initial encounter: Secondary | ICD-10-CM | POA: Diagnosis not present

## 2016-11-20 DIAGNOSIS — H524 Presbyopia: Secondary | ICD-10-CM | POA: Diagnosis not present

## 2016-11-20 NOTE — Progress Notes (Signed)
Patient walked in with right leg bleeding, dripping down the shin.  She reports that earlier today she fell off stool in her closet, which flipped and hit her right shin, causing a wound. She bandaged it, it had stopped bleeding, but started bleeding again while at the eye doctor, came directly here.  Last tetanus 10/2014 Immunization History  Administered Date(s) Administered  . Influenza Whole 04/09/2011  . Influenza, High Dose Seasonal PF 05/08/2014, 05/01/2015, 05/26/2016  . Influenza,inj,Quad PF,36+ Mos 04/14/2013  . Pneumococcal Conjugate-13 11/01/2014  . Pneumococcal Polysaccharide-23 04/09/2011  . Td 08/07/2006  . Tdap 11/01/2014  . Zoster 10/25/2008    She is no longer taking any blood thinners, not taking NSAIDs, just 81mg  aspirin daily.  Outpatient Encounter Prescriptions as of 11/20/2016  Medication Sig Note  . aspirin EC 81 MG tablet Take 81 mg by mouth daily.   . Calcium Citrate-Vitamin D (CALCIUM + D PO) Take 1 tablet by mouth daily.   [name] ibandronate (BONIVA) 150 MG tablet Take in morning once monthly, with a full glass of water, on empty stomach, and nothing else by mouth or lie down for the next 30 min.   [name] lisinopril-hydrochlorothiazide (PRINZIDE,ZESTORETIC) 20-12.5 MG tablet Take 1 tablet by mouth daily.   . Multiple Vitamins-Minerals (MULTIVITAMIN WITH MINERALS) tablet Take 1 tablet by mouth daily.   . simvastatin (ZOCOR) 40 MG tablet Take 1 tablet (40 mg total) by mouth at bedtime.   [name] trimethoprim (TRIMPEX) 100 MG tablet Take 100 mg by mouth daily. 05/01/2015: Per Dr. McDiarmid for UTI prevention, started 04/2015  . [DISCONTINUED] docusate sodium (COLACE) 100 MG capsule Take 100 mg by mouth daily.    . [DISCONTINUED] esomeprazole (NEXIUM) 20 MG capsule Take 20 mg by mouth every evening.   . [DISCONTINUED] Omega-3 Fatty Acids (FISH OIL) 1200 MG CAPS Take 1 capsule by mouth daily.    No facility-administered encounter medications on file as of 11/20/2016.    Allergies   Allergen Reactions  . Naprosyn [Naproxen] Rash   ROS:  No fever, chills, URI symptoms, other bleeding, bruising, rash.  No GI complaints or other concerns, just the wound on her right leg.  PHYSICAL EXAM:  BP 130/70 (BP Location: Left Arm, Patient Position: Sitting, Cuff Size: Normal)   Pulse 72   Temp 98.9 F (37.2 C) (Tympanic)   Ht 5' 4.5" (1.638 m)   Wt 135 lb 6.4 oz (61.4 kg)   BMI 22.88 kg/m   Well appearing, pleasant female in no distress  Right shin--vertical superficial laceration/skin tear measuring just under 2 cm.  There is the start of some superficial swelling and ecchymosis, about 6 cm in size surrounding the wound.  At the time of her evaluation her leg is elevated (on chair, straight out), and there is no active bleeding. There is no crusting, erythema, warmth. Lower leg is normal, without edema. Normal pulses  ASSESSMENT/PLAN:  Laceration of right lower leg, initial encounter - superficial.  Bleeding controlled with pressure.  Wound cleansed, pressure bandage applied.  Wound care instructions reviewed and s/sx infection

## 2016-11-20 NOTE — Patient Instructions (Signed)
Apply ice and keep the leg elevated off/on for the next few hours.  Keep the dressing in place until tomorrow. Tomorrow it should be fine to take the bandage off and wash carefully with soap and water.  Use bacitracin and bandage.  If it starts to bleed again, elevate and hold direct pressure until it stops again.

## 2016-11-21 ENCOUNTER — Encounter: Payer: Self-pay | Admitting: Family Medicine

## 2016-11-25 DIAGNOSIS — M7502 Adhesive capsulitis of left shoulder: Secondary | ICD-10-CM | POA: Diagnosis not present

## 2016-11-26 ENCOUNTER — Encounter: Payer: Self-pay | Admitting: Family Medicine

## 2016-12-04 ENCOUNTER — Other Ambulatory Visit: Payer: Medicare Other

## 2016-12-04 DIAGNOSIS — Z5181 Encounter for therapeutic drug level monitoring: Secondary | ICD-10-CM | POA: Diagnosis not present

## 2016-12-04 DIAGNOSIS — I10 Essential (primary) hypertension: Secondary | ICD-10-CM

## 2016-12-04 DIAGNOSIS — R7301 Impaired fasting glucose: Secondary | ICD-10-CM | POA: Diagnosis not present

## 2016-12-04 DIAGNOSIS — E7800 Pure hypercholesterolemia, unspecified: Secondary | ICD-10-CM | POA: Diagnosis not present

## 2016-12-04 LAB — LIPID PANEL
Cholesterol: 135 mg/dL (ref <200)
HDL: 49 mg/dL — ABNORMAL LOW (ref >50)
LDL CALC: 49 mg/dL (ref <100)
Total CHOL/HDL Ratio: 2.8 Ratio (ref <5.0)
Triglycerides: 184 mg/dL — ABNORMAL HIGH (ref <150)
VLDL: 37 mg/dL — AB (ref <30)

## 2016-12-04 LAB — COMPREHENSIVE METABOLIC PANEL
ALK PHOS: 42 U/L (ref 33–130)
ALT: 49 U/L — AB (ref 6–29)
AST: 42 U/L — AB (ref 10–35)
Albumin: 4.2 g/dL (ref 3.6–5.1)
BILIRUBIN TOTAL: 0.6 mg/dL (ref 0.2–1.2)
BUN: 15 mg/dL (ref 7–25)
CO2: 25 mmol/L (ref 20–31)
CREATININE: 0.92 mg/dL (ref 0.60–0.93)
Calcium: 9.2 mg/dL (ref 8.6–10.4)
Chloride: 103 mmol/L (ref 98–110)
GLUCOSE: 98 mg/dL (ref 65–99)
POTASSIUM: 4.5 mmol/L (ref 3.5–5.3)
SODIUM: 140 mmol/L (ref 135–146)
Total Protein: 6.6 g/dL (ref 6.1–8.1)

## 2016-12-04 LAB — CBC WITH DIFFERENTIAL/PLATELET
BASOS ABS: 0 {cells}/uL (ref 0–200)
Basophils Relative: 0 %
Eosinophils Absolute: 132 cells/uL (ref 15–500)
Eosinophils Relative: 2 %
HEMATOCRIT: 37.3 % (ref 35.0–45.0)
HEMOGLOBIN: 12.5 g/dL (ref 11.7–15.5)
LYMPHS ABS: 2838 {cells}/uL (ref 850–3900)
Lymphocytes Relative: 43 %
MCH: 33.5 pg — AB (ref 27.0–33.0)
MCHC: 33.5 g/dL (ref 32.0–36.0)
MCV: 100 fL (ref 80.0–100.0)
MONO ABS: 660 {cells}/uL (ref 200–950)
MPV: 10.2 fL (ref 7.5–12.5)
Monocytes Relative: 10 %
NEUTROS PCT: 45 %
Neutro Abs: 2970 cells/uL (ref 1500–7800)
Platelets: 247 10*3/uL (ref 140–400)
RBC: 3.73 MIL/uL — ABNORMAL LOW (ref 3.80–5.10)
RDW: 13.8 % (ref 11.0–15.0)
WBC: 6.6 10*3/uL (ref 4.0–10.5)

## 2016-12-05 DIAGNOSIS — M7502 Adhesive capsulitis of left shoulder: Secondary | ICD-10-CM | POA: Diagnosis not present

## 2016-12-05 LAB — HEMOGLOBIN A1C
Hgb A1c MFr Bld: 5.7 % — ABNORMAL HIGH (ref <5.7)
Mean Plasma Glucose: 117 mg/dL

## 2016-12-09 DIAGNOSIS — M7502 Adhesive capsulitis of left shoulder: Secondary | ICD-10-CM | POA: Diagnosis not present

## 2016-12-09 NOTE — Progress Notes (Signed)
Chief Complaint  Patient presents with  . Medicare Wellness    nonfasting (labs already done) AWV with pelvic. No concerns.     Dorothy Rojas is a 71 y.o. female who presents for annual wellness visit and follow-up on chronic medical conditions.    DOE:  She had normal spirometry, normal CXR.  She was referred to Dr. Einar Gip, who did nuclear stress test in August 2017.  Stress EKG was + for myocardial ischemia (50mm downsloping ST seg depression, with sx of SOB). Perfusion scan--no definite ischemia (breast attenuation noted), considered a low risk scan. Echo showed mild-mod tricuspid regurg. She last saw him in December.  He felt that her DOE was likely related to mild underlying COPD.  He will see her again in 2 years, sooner if needed. Only one day last week did she notice any shortness of breath. She usually walks in the morning, but noted when walking with her husband in the afternoon; it was a warm day.    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  Tries to walk most days of the week.  Osteoporosis:  She was started on Boniva in May 2017, after her DEXA done 12/10/15 showed T -2.5 at R fem neck. She is tolerating this without side effects.  Denies dysphagia or chest pain.    Hypertension follow-up: Blood pressures are not checked elsewhere. Denies dizziness, chest pain. Denies side effects of medications. Denies headaches. She denies exertional chest pain. She gets intermittent mild vertigo (when sitting up in bed, sitting up from getting hair washed, very short-lived)--short lived, not worsening or increasing in frequency. She gets slight swelling in her ankles if she is on her feet a lot, by the end of the day, never present when she first wakes up.  Left knee pain: under the care of Dr. Alvan Dame. She has gotten quite a few cortisone shots to her left knee (at one point was every 3-4 months, states she will eventually will need partial knee replacement)--hasn't had another  one (in over 6 months) due to having worsening urinary incontinence after the last cortisone shot.  She is planning to have a different type of injection, scheduled. Had cortisone injection to her left shoulder by Dr. Onnie Graham last week. That has helped, and is getting PT (at [place]). Had some worsening urinary incontinence (short-lived) after that injection as well.  GERD: Hasn't been taking OTC Nexium for a while.  Had some slight reflux a few times recently, but also had some dietary changes (fried food). She hasn't had any further chest pain (started taking PPI after ER visit for chest pain).  Hematuria/incontinence--previously underwent eval by Dr. McDiarmid with normal cystoscopy. She had CT which showed some cysts--he felt all was okay. Bladder is colonized with bacteria, and will always show bacteria in urine, but also has still had some intermittent infections. She takes Trimethoprim once daily for UTI prevention, and hasn't had any infections since on this regimen. Got second Macroplastique procedure in September 2016, and ultimately needed periurethral biopsy for abnormal finding on CT. 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 She had good results from macroplastique, but only very short-lived with the second procedure (only lasted 1-2 weeks). She reports that her urinary leakage actually improved significantly in early February. It has remained very well controlled since that time (except for the day of her last knee cortisone shot). Currently it isn't as good as the best, but isn't bad now.  She  had similar problem with worsening incontinence after a cortisone shot to her shoulder last week (11/2016).  Hyperlipidemia follow-up: Patient is reportedly following a low-fat, low cholesterol diet (recent dietary indiscretion). Compliant with medications and denies medication side effects.  She admits that her diet has been worse over the last couple of weeks (had some  fried food, [place] She only rarely gets some leg cramps at night, possibly if she didn't drink enough water.  H/o DVT--she completed 6 months of xarelto.She was discharged from the care of hematologist. If any recurrence, will need lifelong treatment.  She is taking 81mg  of ASA daily. No other blood thinning agents were recommended. Denies any bleeding/bruising. Not a candidate for Evista for osteoporosis treatment given her DVT.  Constipation--doing well, on stool softener once daily, and takes metamucil.  MNG--denies symptoms/dysphagia.  Carotid atherosclerosis.   Patient denies any neurologic symptoms. After last doppler, Dr. Einar Gip recommended not needing again unless had bruit or symptoms. Carotid dopplers 04/2016: IMPRESSION: Mild atherosclerotic disease in the bilateral carotid arteries. Findings are similar to the previous examination. Estimated degree of stenosis in the internal carotid arteries is less than 50% Bilaterally.  Patent vertebral arteries with antegrade flow.    Immunization History  Administered Date(s) Administered  . Influenza Whole 04/09/2011  . Influenza, High Dose Seasonal PF 05/08/2014, 05/01/2015, 05/26/2016  . Influenza,inj,Quad PF,36+ Mos 04/14/2013  . Pneumococcal Conjugate-13 11/01/2014  . Pneumococcal Polysaccharide-23 04/09/2011  . Td 08/07/2006  . Tdap 11/01/2014  . Zoster 10/25/2008   Last Pap smear: can't recall. S/p hysterectomy for benign reasons  Last mammogram: today Last colonoscopy: 06/2015 (along with EGD) with Dr. Henrene Pastor. Had adenomatous polyp, due again in 2021 Last DEXA: 12/2015, T-2.5 at R fem neck Ophtho: yearly  Dentist: regularly, with dentist and periodontist every 3-4 months  Exercise: regular--walks daily (as weather permits); Silver Sneakers 2x/week.  Other doctors caring for patient include: Dr. McDiarmid (urology)  Dr. Gershon Crane (ophtho)  Dr. Henrene Pastor (GI) Dr. Conley Canal (dentist)   Dr. Juliann Mule (hematologist)--released from his care Dr. Alvan Dame (ortho-knee) Dr. Onnie Graham (ortho--shoulder) Dr. Essie Hart (periodontist) Dr. Wilhemina Bonito (dermatologist)   Depression screen:  negative Fall Screen: 1--lost balance standing on a stool, superficial laceration to her right shin (see last visit), didn't require sutures. Functional Status screen: notable only for urinary incontinence, some trouble hearing in crowded rooms. See epic for full screens   End of Life Discussion:  Patient has a living will and medical power of attorney  Past Medical History:  Diagnosis Date  . Anemia   . Arthritis    knees  . Chronic constipation   . Colon polyp 06/2015   tubular adenoma (sigmoid)  . Diverticulosis    seen on colonoscopy 06/2015  . GERD (gastroesophageal reflux disease)    normal EGD 06/2015  . Heterozygous factor V Leiden mutation [facility]) dx 07/2013     hematologist--  dr Bernadene Bell ([facility])--  per note "Low Risk"  . History of DVT of lower extremity    dx 07-28-2013  left lower extrem.  --  xarelto for 6 months  . Hypertension   . Lymphadenopathy, inguinal    bilateral  . Microscopic hematuria    chronic   . Multinodular goiter   . Multinodular thyroid    a. Path benign 2013.  [name] OAB (overactive bladder)   . Osteopenia    last DEXA 10/2009  . Prediabetes   . SUI (stress urinary incontinence, female)   .  Tricuspid regurgitation    mild-mod, noted on echo 03/2016    Past Surgical History:  Procedure Laterality Date  . APPENDECTOMY  1973  . CATARACT EXTRACTION W/ INTRAOCULAR LENS  IMPLANT, BILATERAL Bilateral 11-26-13  . CYSTOCELE REPAIR  12-03-2010  . CYSTOSCOPY MACROPLASTIQUE IMPLANT N/A 12/12/2014   Procedure: CYSTOSCOPY MACROPLASTIQUE INJECTION;  Surgeon: Bjorn Loser, MD;  Location[facility];  Service: Urology;  Laterality: N/A;  . CYSTOSCOPY MACROPLASTIQUE IMPLANT N/A 04/12/2015   Procedure: CYSTOSCOPY MACROPLASTIQUE IMPLANT;  Surgeon:  Bjorn Loser, MD;  Location: [facility];  Service: Urology;  Laterality: N/A;  . INCONTINENCE SURGERY  04/1999   Re-do at [facility] for sling complications in [id number]  . KNEE ARTHROSCOPY Right 04-05-2013  . LATERAL EPICONDYLE RELEASE Right   . NEUROMA SURGERY Bilateral 1980's    feet  . OVARIAN CYST REMOVAL Left November 27, 1971  . PUBOVAGINAL SLING N/A 07/30/2015   Procedure: PELVIC EXAM UNDER ANESTHESIA, TRANSVAGINAL BIOPSY OF PERIURETHRAL MASS;  Surgeon: Raynelle Bring, MD;  Location: [facility];  Service: Urology;  Laterality: N/A;  36 MINS   . TONSILLECTOMY  as child  . TOTAL ABDOMINAL HYSTERECTOMY W/ BILATERAL SALPINGOOPHORECTOMY  09/  2000  . TRANSTHORACIC ECHOCARDIOGRAM  05-04-2014   normal ,  ef 60-65%    Social History   Social History  . Marital status: Married    Spouse name: N/A  . Number of children: 3  . Years of education: N/A   Occupational History  . Retired [employer]   Social History Main Topics  . Smoking status: Former Smoker    Packs/day: 1.00    Years: 21.00    Types: Cigarettes    Quit date: 08/12/1983  . Smokeless tobacco: Never Used  . Alcohol use 0.0 oz/week     Comment: 1 glass of wine most days of the week  . Drug use: No  . Sexual activity: Not Currently   Other Topics Concern  . Not on file   Social History Narrative   Married.  Lives with husband.  Children live in [place], [place].  Youngest child was killed in [place].  3 stepchildren are all local. 5 grandchildren (2 in [place], 2 from step-daughter in [place], and another in [place]).       1 dog (Muffin lost vision Nov 26, 2013 due to retinal detachment)--died in 11/26/16.    Family History  Problem Relation Age of Onset  . Hypertension Father   . Heart disease Father 73    CABG, died from MI at 12  . COPD Father   . Cirrhosis Mother     related to psoriasis medications  . Diabetes Mother   . Psoriasis Mother   . Osteogenesis imperfecta Sister   . Heart disease Sister     CHF  . Diabetes Brother   .  Heart disease Brother 41    MI  . Deep vein thrombosis Brother     factor V Leiden NEGATIVE  . Cancer Neg Hx     Outpatient Encounter Prescriptions as of 12/10/2016  Medication Sig Note  . aspirin EC 81 MG tablet Take 81 mg by mouth daily.   . Calcium Citrate-Vitamin D (CALCIUM + D PO) Take 1 tablet by mouth daily.   [name] ibandronate (BONIVA) 150 MG tablet Take in morning once monthly, with a full glass of water, on empty stomach, and nothing else by mouth or lie down for the next 30 min.   [name] lisinopril-hydrochlorothiazide (PRINZIDE,ZESTORETIC) 20-12.5 MG tablet Take 1  tablet by mouth daily.   . Multiple Vitamins-Minerals (MULTIVITAMIN WITH MINERALS) tablet Take 1 tablet by mouth daily.   . simvastatin (ZOCOR) 40 MG tablet Take 1 tablet (40 mg total) by mouth at bedtime.   [name] trimethoprim (TRIMPEX) 100 MG tablet Take 100 mg by mouth daily. 05/01/2015: Per Dr. McDiarmid for UTI prevention, started 04/2015  . vitamin E 100 UNIT capsule Take 100 Units by mouth daily.    No facility-administered encounter medications on file as of 12/10/2016.     Allergies  Allergen Reactions  . Naprosyn [Naproxen] Rash    ROS:The patient denies anorexia, fever, headaches, vision changes, decreased hearing, ear pain, sore throat, breast concerns, chest pain, palpitations, syncope, dyspnea on exertion, cough, swelling, nausea, vomiting, diarrhea, abdominal pain, melena, hematochezia, hematuria, dysuria, vaginal bleeding, discharge, odor or itch, genital lesions, numbness, tingling, weakness, tremor, suspicious skin lesions, depression, anxiety, abnormal bleeding or enlarged lymph nodes.  L knee pain, left shoulder pain, per HPI Constipation--controlled with metamucil and stool softeners Intermittent vertigo, usually only when she sits up quickly from laying down, short-lived. Urinary incontinence per HPI, overall okay. Some sneezing/allergies today only.     PHYSICAL EXAM:  BP 110/60 (BP Location: Left Arm,  Patient Position: Sitting, Cuff Size: Normal)   Pulse 72   Ht 5\' 4"  (1.626 m)   Wt 132 lb 6.4 oz (60.1 kg)   BMI 22.73 kg/m   Wt Readings from Last 3 Encounters:  12/10/16 132 lb 6.4 oz (60.1 kg)  11/20/16 135 lb 6.4 oz (61.4 kg)  05/26/16 129 lb 3.2 oz (58.6 kg)   123# at [facility] 10/2015  General Appearance:  Alert, cooperative, no distress, appears stated age   Head:  Normocephalic, without obvious abnormality, atraumatic   Eyes:  PERRL, conjunctiva/corneas clear, EOM's intact, fundi benign   Ears:  Normal TM's and external ear canals   Nose:  Nares normal, no drainage or sinus tenderness; Nasal mucosa mildly edematous, no drainage or purulence  Throat:  Lips, mucosa, and tongue normal; teeth and gums normal   Neck:  Supple, no lymphadenopathy; thyroid: no enlargement/tenderness/nodules; no carotid bruit or JVD   Back:  Spine nontender, no curvature, ROM normal, no CVA tenderness.   Lungs:  Clear to auscultation bilaterally without wheezes, rales or ronchi; respirations unlabored   Chest Wall:  No tenderness or deformity   Heart:  Regular rate and rhythm, S1 and S2 normal, no murmur, rub or gallop   Breast Exam:  No tenderness, masses, or nipple discharge. Nipples are inverted bilaterally (chronic, per pt). No axillary lymphadenopathy.   Abdomen:  Soft, non-tender, nondistended, normoactive bowel sounds, no masses, no hepatosplenomegaly   Genitalia:  Normal external genitalia without lesions. BUS and vagina normal; Uterus and adnexa surgically absent. No masses palpable   Rectal:  Normal tone, no masses or tenderness; guaiac-negative stool.  Slightly diminished sphincter tone, unchanged  Extremities:  No clubbing, cyanosis or edema. No cords, no edema.  Pulses:  2+ and symmetric all extremities   Skin:  Skin color, texture, turgor normal, no rashes or suspicious lesions.  Many SK's.   Lymph nodes:  Cervical,  supraclavicular, and axillary nodes normal   Neurologic:  CNII-XII intact, normal strength, sensation and gait; reflexes 2+ and symmetric throughout    Psych:   Normal mood, affect, hygiene and grooming  Lab Results  Component Value Date   HGBA1C 5.7 (H) 12/04/2016     Chemistry      Component Value Date/Time   NA  140 12/04/2016 0713   NA 144 01/26/2014 0809   K 4.5 12/04/2016 0713   K 4.6 01/26/2014 0809   CL 103 12/04/2016 0713   CO2 25 12/04/2016 0713   CO2 29 01/26/2014 0809   BUN 15 12/04/2016 0713   BUN 18.5 01/26/2014 0809   CREATININE 0.92 12/04/2016 0713   CREATININE 0.8 01/26/2014 0809      Component Value Date/Time   CALCIUM 9.2 12/04/2016 0713   CALCIUM 9.4 01/26/2014 0809   ALKPHOS 42 12/04/2016 0713   ALKPHOS 57 10/13/2013 0910   AST 42 (H) 12/04/2016 0713   AST 21 10/13/2013 0910   ALT 49 (H) 12/04/2016 0713   ALT 23 10/13/2013 0910   BILITOT 0.6 12/04/2016 0713   BILITOT 0.42 10/13/2013 0910     Fasting glu 98  Lab Results  Component Value Date   CHOL 135 12/04/2016   HDL 49 (L) 12/04/2016   LDLCALC 49 12/04/2016   TRIG 184 (H) 12/04/2016   CHOLHDL 2.8 12/04/2016   Lab Results  Component Value Date   WBC 6.6 12/04/2016   HGB 12.5 12/04/2016   HCT 37.3 12/04/2016   MCV 100.0 12/04/2016   PLT 247 12/04/2016     ASSESSMENT/PLAN:  Medicare annual wellness visit, subsequent  Essential hypertension, benign - well controlled  Pure hypercholesterolemia - LDL at goal; TG mildly elevated--reviewed proper diet, consider fish oil daily - Plan: simvastatin (ZOCOR) 40 MG tablet  Impaired fasting glucose - now normal, A1c 5.7. Encouraged daily exercise, proper diet  Medication monitoring encounter  Multinodular goiter - stable, no discrete nodules appreciated on exam  OAB (overactive bladder)  Heterozygous factor V Leiden mutation ([place]) - continue aspirin 81mg  daily  Gastroesophageal reflux disease without  esophagitis - improved with dietary measures. Discussed prn use of Nexium with foods that trigger  Osteoporosis without current pathological fracture, unspecified osteoporosis type - continue Boniva. Recheck DEXA next year - Plan: ibandronate (BONIVA) 150 MG tablet  Elevated LFTs - mild--continue regular monitoring.  Avoid excessive alcohol or tylenol    f/u 6 months with fasting labs prior--glu, a1c, LFT's lipids, TSH   Discussed monthly self breast exams and yearly mammograms; at least 30 minutes of aerobic activity at least 5 days/week and weight-bearing exercise 2x/week; proper sunscreen use reviewed; healthy diet, including goals of calcium and vitamin D intake and alcohol recommendations (less than or equal to 1 drink/day) reviewed; regular seatbelt use; changing batteries in smoke detectors. Immunization recommendations discussed-- continue yearly flu shots. Shingrix recommended, risks/side effects discussed, to get from pharmacy. Colonoscopy recommendations reviewed, due in 2021    Medicare Attestation I have personally reviewed: The patient's medical and social history Their use of alcohol, tobacco or illicit drugs Their current medications and supplements The patient's functional ability including ADLs,fall risks, home safety risks, cognitive, and hearing and visual impairment Diet and physical activities Evidence for depression or mood disorders  The patient's weight, height, and BMI have been recorded in the chart.  I have made referrals, counseling, and provided education to the patient based on review of the above and I have provided the patient with a written personalized care plan for preventive services.

## 2016-12-10 ENCOUNTER — Ambulatory Visit (INDEPENDENT_AMBULATORY_CARE_PROVIDER_SITE_OTHER): Payer: Medicare Other | Admitting: Family Medicine

## 2016-12-10 ENCOUNTER — Encounter: Payer: Self-pay | Admitting: Family Medicine

## 2016-12-10 ENCOUNTER — Ambulatory Visit
Admission: RE | Admit: 2016-12-10 | Discharge: 2016-12-10 | Disposition: A | Discharge status: Home / Self Care | Payer: Medicare Other | Source: Ambulatory Visit | Attending: Family Medicine | Admitting: Family Medicine

## 2016-12-10 VITALS — BP 110/60 | HR 72 | Ht 64.0 in | Wt 132.4 lb

## 2016-12-10 DIAGNOSIS — Z01419 Encounter for gynecological examination (general) (routine) without abnormal findings: Secondary | ICD-10-CM | POA: Diagnosis not present

## 2016-12-10 DIAGNOSIS — K219 Gastro-esophageal reflux disease without esophagitis: Secondary | ICD-10-CM

## 2016-12-10 DIAGNOSIS — E042 Nontoxic multinodular goiter: Secondary | ICD-10-CM | POA: Diagnosis not present

## 2016-12-10 DIAGNOSIS — Z5181 Encounter for therapeutic drug level monitoring: Secondary | ICD-10-CM

## 2016-12-10 DIAGNOSIS — R945 Abnormal results of liver function studies: Secondary | ICD-10-CM

## 2016-12-10 DIAGNOSIS — M810 Age-related osteoporosis without current pathological fracture: Secondary | ICD-10-CM | POA: Diagnosis not present

## 2016-12-10 DIAGNOSIS — R7989 Other specified abnormal findings of blood chemistry: Secondary | ICD-10-CM | POA: Diagnosis not present

## 2016-12-10 DIAGNOSIS — Z1231 Encounter for screening mammogram for malignant neoplasm of breast: Secondary | ICD-10-CM | POA: Diagnosis not present

## 2016-12-10 DIAGNOSIS — R7301 Impaired fasting glucose: Secondary | ICD-10-CM

## 2016-12-10 DIAGNOSIS — E7800 Pure hypercholesterolemia, unspecified: Secondary | ICD-10-CM

## 2016-12-10 DIAGNOSIS — I10 Essential (primary) hypertension: Secondary | ICD-10-CM | POA: Diagnosis not present

## 2016-12-10 DIAGNOSIS — D6851 Activated protein C resistance: Secondary | ICD-10-CM | POA: Diagnosis not present

## 2016-12-10 DIAGNOSIS — N3281 Overactive bladder: Secondary | ICD-10-CM | POA: Diagnosis not present

## 2016-12-10 DIAGNOSIS — Z0000 Encounter for general adult medical examination without abnormal findings: Secondary | ICD-10-CM

## 2016-12-10 MED ORDER — SIMVASTATIN 40 MG PO TABS: 40.0000 mg | ORAL_TABLET | Freq: Every day | ORAL | 1 refills | Status: DC

## 2016-12-10 MED ORDER — IBANDRONATE SODIUM 150 MG PO TABS: ORAL_TABLET | ORAL | 3 refills | Status: DC

## 2016-12-10 NOTE — Patient Instructions (Signed)
  HEALTH MAINTENANCE RECOMMENDATIONS:  It is recommended that you get at least 30 minutes of aerobic exercise at least 5 days/week (for weight loss, you may need as much as 60-90 minutes). This can be any activity that gets your heart rate up. This can be divided in 10-15 minute intervals if needed, but try and build up your endurance at least once a week.  Weight bearing exercise is also recommended twice weekly.  Eat a healthy diet with lots of vegetables, fruits and fiber.  "Colorful" foods have a lot of vitamins (ie green vegetables, tomatoes, red peppers, etc).  Limit sweet tea, regular sodas and alcoholic beverages, all of which has a lot of calories and sugar.  Up to 1 alcoholic drink daily may be beneficial for women (unless trying to lose weight, watch sugars).  Drink a lot of water.  Calcium recommendations are 1200-1500 mg daily (1500 mg for postmenopausal women or women without ovaries), and vitamin D 1000 IU daily.  This should be obtained from diet and/or supplements (vitamins), and calcium should not be taken all at once, but in divided doses.  Monthly self breast exams and yearly mammograms for women over the age of 71 is recommended.  Sunscreen of at least SPF 30 should be used on all sun-exposed parts of the skin when outside between the hours of 10 am and 4 pm (not just when at beach or pool, but even with exercise, golf, tennis, and yard work!)  Use a sunscreen that says "broad spectrum" so it covers both UVA and UVB rays, and make sure to reapply every 1-2 hours.  Remember to change the batteries in your smoke detectors when changing your clock times in the spring and fall.  Use your seat belt every time you are in a car, and please drive safely and not be distracted with cell phones and texting while driving.   Dorothy Rojas , Thank you for taking time to come for your Medicare Wellness Visit. I appreciate your ongoing commitment to your health goals. Please review the following  plan we discussed and let me know if I can assist you in the future.   These are the goals we discussed: Goals    None      This is a list of the screening recommended for you and due dates:  Health Maintenance  Topic Date Due  . Flu Shot  03/11/2017  . Mammogram  12/09/2017  . Colon Cancer Screening  06/20/2020  . Tetanus Vaccine  10/31/2024  . DEXA scan (bone density measurement)  Completed  .  Hepatitis C: One time screening is recommended by Center for Disease Control  (CDC) for  adults born from [birth year] through 1965.   Completed  . Pneumonia vaccines  Completed   Your next bone density test will be due in 12/2017.  I recommend getting the new shingles vaccine (Shingrix). You will need to check with your insurance to see if it is covered, and if covered by Medicare Part D, you need to get from the pharmacy rather than our office.  It is a series of 2 injections, spaced 2 months apart.  Consider taking omega-3 fish oil.  Definitely avoid fried foods--your triglycerides were up some (fats in the blood). Liver tests were slightly elevated, we will continue to monitor.

## 2016-12-11 ENCOUNTER — Other Ambulatory Visit: Payer: Self-pay | Admitting: Family Medicine

## 2016-12-11 DIAGNOSIS — R928 Other abnormal and inconclusive findings on diagnostic imaging of breast: Secondary | ICD-10-CM

## 2016-12-22 ENCOUNTER — Ambulatory Visit
Admission: RE | Admit: 2016-12-22 | Discharge: 2016-12-22 | Disposition: A | Discharge status: Home / Self Care | Payer: Medicare Other | Source: Ambulatory Visit | Attending: Family Medicine | Admitting: Family Medicine

## 2016-12-22 DIAGNOSIS — R928 Other abnormal and inconclusive findings on diagnostic imaging of breast: Secondary | ICD-10-CM

## 2016-12-23 DIAGNOSIS — M7502 Adhesive capsulitis of left shoulder: Secondary | ICD-10-CM | POA: Diagnosis not present

## 2016-12-24 DIAGNOSIS — M7502 Adhesive capsulitis of left shoulder: Secondary | ICD-10-CM | POA: Diagnosis not present

## 2016-12-25 DIAGNOSIS — G8929 Other chronic pain: Secondary | ICD-10-CM | POA: Diagnosis not present

## 2016-12-25 DIAGNOSIS — M1712 Unilateral primary osteoarthritis, left knee: Secondary | ICD-10-CM | POA: Diagnosis not present

## 2016-12-25 DIAGNOSIS — M25562 Pain in left knee: Secondary | ICD-10-CM | POA: Diagnosis not present

## 2016-12-30 DIAGNOSIS — M7502 Adhesive capsulitis of left shoulder: Secondary | ICD-10-CM | POA: Diagnosis not present

## 2016-12-31 DIAGNOSIS — M1712 Unilateral primary osteoarthritis, left knee: Secondary | ICD-10-CM | POA: Diagnosis not present

## 2017-01-07 DIAGNOSIS — M7502 Adhesive capsulitis of left shoulder: Secondary | ICD-10-CM | POA: Diagnosis not present

## 2017-01-08 DIAGNOSIS — M25562 Pain in left knee: Secondary | ICD-10-CM | POA: Diagnosis not present

## 2017-01-08 DIAGNOSIS — G8929 Other chronic pain: Secondary | ICD-10-CM | POA: Diagnosis not present

## 2017-01-08 DIAGNOSIS — M1712 Unilateral primary osteoarthritis, left knee: Secondary | ICD-10-CM | POA: Diagnosis not present

## 2017-02-03 ENCOUNTER — Other Ambulatory Visit: Payer: Self-pay | Admitting: Family Medicine

## 2017-02-25 DIAGNOSIS — M25562 Pain in left knee: Secondary | ICD-10-CM | POA: Diagnosis not present

## 2017-02-25 DIAGNOSIS — M1712 Unilateral primary osteoarthritis, left knee: Secondary | ICD-10-CM | POA: Diagnosis not present

## 2017-02-25 DIAGNOSIS — G8929 Other chronic pain: Secondary | ICD-10-CM | POA: Diagnosis not present

## 2017-03-04 ENCOUNTER — Ambulatory Visit
Admission: RE | Admit: 2017-03-04 | Discharge: 2017-03-04 | Disposition: A | Discharge status: Home / Self Care | Payer: Medicare Other | Source: Ambulatory Visit | Attending: Family Medicine | Admitting: Family Medicine

## 2017-03-04 ENCOUNTER — Ambulatory Visit (INDEPENDENT_AMBULATORY_CARE_PROVIDER_SITE_OTHER): Payer: Medicare Other | Admitting: Family Medicine

## 2017-03-04 ENCOUNTER — Encounter: Payer: Self-pay | Admitting: Family Medicine

## 2017-03-04 VITALS — BP 120/72 | HR 67 | Temp 99.5°F | Ht 64.0 in | Wt 134.2 lb

## 2017-03-04 DIAGNOSIS — S62632A Displaced fracture of distal phalanx of right middle finger, initial encounter for closed fracture: Secondary | ICD-10-CM | POA: Diagnosis not present

## 2017-03-04 DIAGNOSIS — M79644 Pain in right finger(s): Secondary | ICD-10-CM

## 2017-03-04 NOTE — Progress Notes (Signed)
Chief Complaint  Patient presents with  . possible broken finger    possible broke finger reaching in golf bag. did this last thursday    7/19, she reached into golf bag, thought pocket was deeper, jammed the middle finger.  It has been painful and swollen since then.  She has iced the finger, taken some tylenol or advil. She thinks it is a little worse today (swelling), which is why she called.   PMH, PSH, SH reviewed  Outpatient Encounter Prescriptions as of 03/04/2017  Medication Sig Note  . aspirin EC 81 MG tablet Take 81 mg by mouth daily.   . Calcium Citrate-Vitamin D (CALCIUM + D PO) Take 1 tablet by mouth daily.   [name] ibandronate (BONIVA) 150 MG tablet Take in morning once monthly, with a full glass of water, on empty stomach, and nothing else by mouth or lie down for the next 30 min.   [name] lisinopril-hydrochlorothiazide (PRINZIDE,ZESTORETIC) 20-12.5 MG tablet TAKE 1 TABLET ONCE DAILY.   . Multiple Vitamins-Minerals (MULTIVITAMIN WITH MINERALS) tablet Take 1 tablet by mouth daily.   . simvastatin (ZOCOR) 40 MG tablet Take 1 tablet (40 mg total) by mouth at bedtime.   [name] trimethoprim (TRIMPEX) 100 MG tablet Take 100 mg by mouth daily. 05/01/2015: Per Dr. McDiarmid for UTI prevention, started 04/2015  . vitamin E 100 UNIT capsule Take 100 Units by mouth daily.    No facility-administered encounter medications on file as of 03/04/2017.    Allergies  Allergen Reactions  . Naprosyn [Naproxen] Rash    ROS: no fever, chills, URI symptoms, bleeding, bruising, rash.  +finger pain, no other joint pains or other concerns.    PHYSICAL EXAM: BP 120/72   Pulse 67   Temp 99.5 F (37.5 C) (Oral)   Ht 5\' 4"  (1.626 m)   Wt 134 lb 3.2 oz (60.9 kg)   BMI 23.04 kg/m   Wt Readings from Last 3 Encounters:  03/04/17 134 lb 3.2 oz (60.9 kg)  12/10/16 132 lb 6.4 oz (60.1 kg)  11/20/16 135 lb 6.4 oz (61.4 kg)    Well appearing, pleasant female in no distress  Right 3rd finger Soft tissue  swelling, mild erythema and decreased ROM at the DIP, and tenderness and swelling ove rthe middle phalanx. Good ROM and nontender and PIP joint. Brisk capillary refill  ASSESSMENT/PLAN:  Finger pain, right - Plan: DG Finger Middle Right  Jammed finger--r/o fracture.  No significant bony deformity palpated, so if fracture, doubt any significant displacement. If nondisplaced fracture noted, treat with supportive measures, splinting.   [facility] imaging for x-ray of the finger. We will contact you later today with the results. Continue to ice the finger as needed for swelling. Keep it elevated above the level of the heart--this helps with swelling and pain. If there is a fracture, it may need to be put in a simple splint. Return immediately if increasing pain, swelling, discoloration of the finger or other concerns.

## 2017-03-04 NOTE — Patient Instructions (Addendum)
Go to [facility] for x-ray of the finger. We will contact you later today with the results. Continue to ice the finger as needed for swelling. Keep it elevated above the level of the heart--this helps with swelling and pain. If there is a fracture, it may need to be put in a simple splint. Return immediately if increasing pain, swelling, discoloration of the finger or other concerns.     Finger Sprain A finger sprain is an injury to one of the strong bands of tissue (ligaments) that connect the bones in the finger. The ligament can be stretched too much, or it can tear. A tear can be either partial or complete. The severity of the sprain depends on how much of the ligament was damaged or torn. CAUSES This injury is often caused by a fall or an accident. For example, if you extend your hands to catch an object or to protect yourself during a fall, the force of impact may cause the ligaments in your finger to stretch too much. RISK FACTORS The following factors may make you more likely to have this injury:  Playing sports that involve a greater risk of falling, such as skiing.  Playing sports that involve catching an object, such as basketball.  Having poor strength and flexibility. SYMPTOMS Symptoms of this condition include:  Pain at the affected finger joint, especially when bending or extending the finger.  Loss of motion in the finger.  Swelling.  Tenderness.  Bruising. DIAGNOSIS This condition is diagnosed with a medical history and physical exam. You may also have an X-ray of your finger to rule out a fracture or dislocation. TREATMENT Treatment varies depending on the severity of the sprain. If your ligament is overstretched or partially torn, treatment usually involves:  Keeping the finger in a fixed position (immobilization) for a period of time. To help you do this, your health care provider may apply a bandage, splint, or cast to keep the finger from moving  until it heals. In some cases, the finger may be taped to the fingers beside it (buddy taping).  Taking medicines for pain.  Doing exercises for the finger after it has begun to heal. If your ligament is fully torn, you may need surgery to reconnect the ligament to the bone. After surgery, a cast or splint will be applied. HOME CARE INSTRUCTIONS If You Have a Splint:  Wear the splint as told by your health care provider. Remove it only as told by your health care provider.  Loosen the splint if your fingers tingle, become numb, or turn cold and blue.  Do not let your splint get wet if it is not waterproof.  Keep the splint clean. If You Have a Cast:  Do not stick anything inside the cast to scratch your skin. Doing that increases your risk of infection.  Check the skin around the cast every day. Tell your health care provider about any concerns.  You may put lotion on dry skin around the edges of the cast. Do not put lotion on the skin underneath the cast.  Do not let your cast get wet if it is not waterproof.  Keep the cast clean. Bathing  If your splint or cast is not waterproof, cover it with a watertight plastic bag when you take a bath or a shower.  Keep any bandages (dressings) dry until your health care provider says they can be removed. Managing Pain, Stiffness, and Swelling  If directed, put ice on the injured  area: ? Put ice in a plastic bag. ? Place a towel between your skin and the bag. ? Leave the ice on for 20 minutes, 2-3 times a day.  Move your fingers often to avoid stiffness and to lessen swelling.  Raise (elevate) the injured area above the level of your heart while you are sitting or lying down. General Instructions  Do not put pressure on any part of the cast or splint until it is fully hardened. This may take several hours.  Take over-the-counter and prescription medicines only as told by your health care provider.  Do not drive or operate heavy  machinery while taking prescription pain medicine.  Do exercises as told by your health care provider or physical therapist.  Do not wear rings on your injured finger.  Keep all follow-up visits as told by your health care provider. This is important. SEEK MEDICAL CARE IF:  Your pain is not controlled with medicine.  Your bruising or swelling gets worse.  Your cast or splint is damaged.  Your finger is numb or blue.  Your finger feels colder than normal. This information is not intended to replace advice given to you by your health care provider. Make sure you discuss any questions you have with your health care provider. Document Released: 09/04/2004 Document Revised: 11/19/2015 Document Reviewed: 06/07/2015 Elsevier Interactive Patient Education  2017 [employer].

## 2017-04-14 DIAGNOSIS — L821 Other seborrheic keratosis: Secondary | ICD-10-CM | POA: Diagnosis not present

## 2017-05-04 DIAGNOSIS — M5416 Radiculopathy, lumbar region: Secondary | ICD-10-CM | POA: Diagnosis not present

## 2017-05-04 DIAGNOSIS — M545 Low back pain: Secondary | ICD-10-CM | POA: Diagnosis not present

## 2017-05-04 DIAGNOSIS — M5136 Other intervertebral disc degeneration, lumbar region: Secondary | ICD-10-CM | POA: Diagnosis not present

## 2017-05-22 DIAGNOSIS — Z23 Encounter for immunization: Secondary | ICD-10-CM | POA: Diagnosis not present

## 2017-06-10 DIAGNOSIS — L820 Inflamed seborrheic keratosis: Secondary | ICD-10-CM | POA: Diagnosis not present

## 2017-06-10 DIAGNOSIS — D485 Neoplasm of uncertain behavior of skin: Secondary | ICD-10-CM | POA: Diagnosis not present

## 2017-06-11 ENCOUNTER — Encounter: Payer: Self-pay | Admitting: Family Medicine

## 2017-06-11 ENCOUNTER — Ambulatory Visit (INDEPENDENT_AMBULATORY_CARE_PROVIDER_SITE_OTHER): Payer: Medicare Other | Admitting: Family Medicine

## 2017-06-11 VITALS — BP 128/70 | HR 72 | Temp 98.0°F | Ht 64.0 in | Wt 137.2 lb

## 2017-06-11 DIAGNOSIS — E042 Nontoxic multinodular goiter: Secondary | ICD-10-CM

## 2017-06-11 DIAGNOSIS — N952 Postmenopausal atrophic vaginitis: Secondary | ICD-10-CM

## 2017-06-11 DIAGNOSIS — R945 Abnormal results of liver function studies: Secondary | ICD-10-CM | POA: Diagnosis not present

## 2017-06-11 DIAGNOSIS — R7301 Impaired fasting glucose: Secondary | ICD-10-CM | POA: Diagnosis not present

## 2017-06-11 DIAGNOSIS — R300 Dysuria: Secondary | ICD-10-CM | POA: Diagnosis not present

## 2017-06-11 DIAGNOSIS — R7989 Other specified abnormal findings of blood chemistry: Secondary | ICD-10-CM

## 2017-06-11 DIAGNOSIS — E7800 Pure hypercholesterolemia, unspecified: Secondary | ICD-10-CM

## 2017-06-11 LAB — POCT URINALYSIS DIP (PROADVANTAGE DEVICE)
BILIRUBIN UA: NEGATIVE mg/dL
Bilirubin, UA: NEGATIVE
Glucose, UA: NEGATIVE mg/dL
Leukocytes, UA: NEGATIVE
Nitrite, UA: NEGATIVE
PH UA: 6 (ref 5.0–8.0)
PROTEIN UA: NEGATIVE mg/dL
SPECIFIC GRAVITY, URINE: 1.02
Urobilinogen, Ur: NEGATIVE

## 2017-06-11 MED ORDER — ESTROGENS, CONJUGATED 0.625 MG/GM VA CREA: TOPICAL_CREAM | VAGINAL | 2 refills | Status: DC

## 2017-06-11 NOTE — Patient Instructions (Signed)
Atrophic Vaginitis Atrophic vaginitis is a condition in which the tissues that line the vagina become dry and thin. This condition is most common in women who have stopped having regular menstrual periods (menopause). This usually starts when a woman is 45-71 years old. Estrogen helps to keep the vagina moist. It stimulates the vagina to produce a clear fluid that lubricates the vagina for sexual intercourse. This fluid also protects the vagina from infection. Lack of estrogen can cause the lining of the vagina to get thinner and dryer. The vagina may also shrink in size. It may become less elastic. Atrophic vaginitis tends to get worse over time as a woman's estrogen level drops. What are the causes? This condition is caused by the normal drop in estrogen that happens around the time of menopause. What increases the risk? Certain conditions or situations may lower a woman's estrogen level, which increases her risk of atrophic vaginitis. These include:  Taking medicine that blocks estrogen.  Having ovaries removed surgically.  Being treated for cancer with X-ray treatment (radiation) or medicines (chemotherapy).  Exercising very hard and often.  Having an eating disorder (anorexia).  Giving birth or breastfeeding.  Being over the age of 50.  Smoking.  What are the signs or symptoms? Symptoms of this condition include:  Pain, soreness, or bleeding during sexual intercourse (dyspareunia).  Vaginal burning, irritation, or itching.  Pain or bleeding during a vaginal examination using a speculum (pelvic exam).  Loss of interest in sexual activity.  Having burning pain when passing urine.  Vaginal discharge that is brown or yellow.  In some cases, there are no symptoms. How is this diagnosed? This condition is diagnosed with a medical history and physical exam. This will include a pelvic exam that checks whether the inside of your vagina appears pale, thin, or dry. Rarely, you may  also have other tests, including:  A urine test.  A test that checks the acid balance in your vaginal fluid (acid balance test).  How is this treated? Treatment for this condition may depend on the severity of your symptoms. Treatment may include:  Using an over-the-counter vaginal lubricant before you have sexual intercourse.  Using a long-acting vaginal moisturizer.  Using low-dose vaginal estrogen for moderate to severe symptoms that do not respond to other treatments. Options include creams, tablets, and inserts (vaginal rings). Before using vaginal estrogen, tell your health care provider if you have a history of: ? Breast cancer. ? Endometrial cancer. ? Blood clots.  Taking medicines. You may be able to take a daily pill for dyspareunia. Discuss all of the risks of this medicine with your health care provider. It is usually not recommended for women who have a family history or personal history of breast cancer.  If your symptoms are very mild and you are not sexually active, you may not need treatment. Follow these instructions at home:  Take medicines only as directed by your health care provider. Do not use herbal or alternative medicines unless your health care provider says that you can.  Use over-the-counter creams, lubricants, or moisturizers for dryness only as directed by your health care provider.  If your atrophic vaginitis is caused by menopause, discuss all of your menopausal symptoms and treatment options with your health care provider.  Do not douche.  Do not use products that can make your vagina dry. These include: ? Scented feminine sprays. ? Scented tampons. ? Scented soaps.  If it hurts to have sex, talk with your sexual   partner. Contact a health care provider if:  Your discharge looks different than normal.  Your vagina has an unusual smell.  You have new symptoms.  Your symptoms do not improve with treatment.  Your symptoms get worse. This  information is not intended to replace advice given to you by your health care provider. Make sure you discuss any questions you have with your health care provider. Document Released: 12/12/2014 Document Revised: 01/03/2016 Document Reviewed: 07/19/2014 Elsevier Interactive Patient Education  2018 [employer]  

## 2017-06-11 NOTE — Progress Notes (Signed)
Chief Complaint  Patient presents with  . Urinary Tract Infection    burning with urination.   She was scheduled for fasting labs (lab visit), but is having urinary complaint, so was seen for office visit today (and had her fasting labs drawn for med check next week).   2 weeks ago she started having discomfort at the end of the void, a burning sensation. Discomfort is at external vaginal area; denies abdominal pain.  She has symptoms with every void. No frequency or change in incontinence. She has chronic issues with incontinence, which overall is doing well. Uses a small pad, changes 1-2/d.  She continues to have some sweats just at night, no hot flashes during the day. There is some vaginal dryness, and discomfort with intercourse.  PMH, [place], SH reviewed No h/o breast problems, does have h/o DVT  Outpatient Encounter Prescriptions as of 06/11/2017  Medication Sig Note  . aspirin EC 81 MG tablet Take 81 mg by mouth daily.   . Calcium Citrate-Vitamin D (CALCIUM + D PO) Take 1 tablet by mouth daily.   [name] ibandronate (BONIVA) 150 MG tablet Take in morning once monthly, with a full glass of water, on empty stomach, and nothing else by mouth or lie down for the next 30 min.   [name] lisinopril-hydrochlorothiazide (PRINZIDE,ZESTORETIC) 20-12.5 MG tablet TAKE 1 TABLET ONCE DAILY.   . Multiple Vitamins-Minerals (MULTIVITAMIN WITH MINERALS) tablet Take 1 tablet by mouth daily.   . simvastatin (ZOCOR) 40 MG tablet Take 1 tablet (40 mg total) by mouth at bedtime.   [name] trimethoprim (TRIMPEX) 100 MG tablet Take 100 mg by mouth daily. 05/01/2015: Per Dr. McDiarmid for UTI prevention, started 04/2015  . vitamin E 100 UNIT capsule Take 100 Units by mouth daily.   [name] conjugated estrogens (PREMARIN) vaginal cream Apply cream to urethral area daily for 1-2 weeks, then 2-3x/week. May use intravaginally 2x/week    No facility-administered encounter medications on file as of 06/11/2017.    Not using premarin cream  prior to today's visit--rx'd today  Allergies  Allergen Reactions  . Naprosyn [Naproxen] Rash   ROS: No fever, chills, visible hematuria, abdominal pain, flank pain +vaginal dryness. +stable incontinence.  No URI symptoms, bleeding, bruising, rash, chest pain or other complaints.  PHYSICAL EXAM:  BP 128/70   Pulse 72   Temp 98 F (36.7 C)   Ht 5\' 4"  (1.626 m)   Wt 137 lb 3.2 oz (62.2 kg)   BMI 23.55 kg/m   Well developed, pleasant female in no distress External genitalia: Atrophic changes noted at urethra and dryness throughout. No lesions or discharge Back: no CVA tenderness Abdomen: soft, nontender.  Urine dip: small blood, otherwise negative  ASSESSMENT/PLAN:  Burning with urination - urine does not show evidence of infection; suspect due to atrophic changes. Short-term trial of topical estrogent.   - Plan: POCT Urinalysis DIP (Proadvantage Device), Urine Culture  Atrophic vaginitis - risks of topical estrogen reviewed--use to peri-urethral area. Use intravaginally with more caution (rarely), due to h/o DVT.  - Plan: conjugated estrogens (PREMARIN) vaginal cream  Impaired fasting glucose - Plan: Hemoglobin A1c, Glucose, random  Multinodular goiter - Plan: TSH  Pure hypercholesterolemia - Plan: Lipid panel, Hepatic function panel  Elevated LFTs - Plan: Hepatic function panel

## 2017-06-12 LAB — TSH: TSH: 4.12 mIU/L (ref 0.40–4.50)

## 2017-06-12 LAB — HEPATIC FUNCTION PANEL
AG Ratio: 1.6 (calc) (ref 1.0–2.5)
ALBUMIN MSPROF: 4.2 g/dL (ref 3.6–5.1)
ALT: 37 U/L — ABNORMAL HIGH (ref 6–29)
AST: 35 U/L (ref 10–35)
Alkaline phosphatase (APISO): 42 U/L (ref 33–130)
BILIRUBIN DIRECT: 0.1 mg/dL (ref 0.0–0.2)
BILIRUBIN INDIRECT: 0.3 mg/dL (ref 0.2–1.2)
Globulin: 2.6 g/dL (calc) (ref 1.9–3.7)
Total Bilirubin: 0.4 mg/dL (ref 0.2–1.2)
Total Protein: 6.8 g/dL (ref 6.1–8.1)

## 2017-06-12 LAB — LIPID PANEL
CHOL/HDL RATIO: 3 (calc) (ref <5.0)
Cholesterol: 143 mg/dL (ref <200)
HDL: 48 mg/dL — AB (ref >50)
LDL Cholesterol (Calc): 72 mg/dL (calc)
NON-HDL CHOLESTEROL (CALC): 95 mg/dL (ref <130)
TRIGLYCERIDES: 142 mg/dL (ref <150)

## 2017-06-12 LAB — HEMOGLOBIN A1C
EAG (MMOL/L): 6.6 (calc)
Hgb A1c MFr Bld: 5.8 % of total Hgb — ABNORMAL HIGH (ref <5.7)
Mean Plasma Glucose: 120 (calc)

## 2017-06-12 LAB — GLUCOSE, RANDOM: Glucose, Bld: 100 mg/dL — ABNORMAL HIGH (ref 65–99)

## 2017-06-13 LAB — URINE CULTURE
MICRO NUMBER: 81227455
SPECIMEN QUALITY: ADEQUATE

## 2017-06-14 ENCOUNTER — Encounter: Payer: Self-pay | Admitting: Family Medicine

## 2017-06-14 NOTE — Progress Notes (Signed)
Chief Complaint  Patient presents with  . Hypertension    nonfasting med check. Thinks urinary symptoms are improving.    Hematuria/incontinence/dysuria. She was seen last week with discomfort at the end of void.  Urine culture didn't show infection. She was prescribed premarin cream.  Prior to starting the premarin cream, after a walk, she came home and had frank hematuria.  She had this just once.  She then started using the premarin cream. She is noticing some improvement in her dysuria.  She has chronic microscopic hematuria, under the care of Dr. McDiarmid. She takesTrimethoprim once daily for UTI prevention, and hasn't had any infections since on this regimen. Got second Macroplastique procedure in September 2016, and ultimately needed periurethral biopsy for abnormal finding on CT. 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 She had good results from macroplastique.  Urinary incontinence is overall pretty well controlled.  Has gotten exacerbations related to steroid injections.  Hypertension follow-up: Blood pressures are rarely checked at home 115-120/70-80. Denies dizziness, chest pain. Denies side effects of medications. Denies headaches. She denies exertional chest pain. She gets intermittent mild vertigo (when sitting up in bed, sitting up from getting hair washed, very short-lived)--short lived, not worsening or increasing in frequency. She getsslight swelling in her ankles if she is on her feet a lot, by the end of the day, never present when she first wakes up.  Joint pains--under the care of Dr. Alvan Dame for her left knee pain. It bothers her the most when she is sleeping at night (rolling over, certain movements).  Doesn't hurt much with walking, just sometimes. Surgery is the next step, whenever she is ready. She ices her knee 20-30 minutes before going to bed, which helps, and uses advil prn. She stopped using tylenol when liver tests were elevated.  She has frozen  shoulder on the left, completed physical therapy.  Has home exercise regimen, but admits she doesn't do it as often as she should.  GERD: She has had some reflux lately, and started burping frequently.  She restarted taking Nexium OTC daily, she is on the second week.  Symptoms have improved.  Denies any dysphagia. She does not believe that the flare of symptoms was timed in relation to her taking her Boniva.  Hyperlipidemia follow-up: Patient is reportedly following a low-fat, low cholesterol diet. Compliant with medications and denies medication side effects.   H/o DVT--she completed 6 months of xarelto.She was discharged from the care of hematologist. If any recurrence, will need lifelong treatment.  She is taking '81mg'$  of ASA daily. No other blood thinning agents were recommended. Denies any bleeding/bruising. Not a candidate for Evista for osteoporosis treatment given her DVT. We discussed risks of estrogen at last visit--using topical cream sparingly.  Constipation--doing well, on stool softener once daily, and takes metamucil.  MNG--denies symptoms/dysphagia. Every once in a while she gets a short-lived discomfort on the left side of her neck.  Carotid atherosclerosis. Patient denies any neurologic symptoms. Dr. Einar Gip recommended not needing again unless had bruit or symptoms, last carotid dopplers 04/2016.  DOE: She had normal spirometry, normal CXR. She was referred to Dr. Einar Gip, who did nuclear stress test in August 2017. Stress EKG was+ for myocardial ischemia (84m downsloping ST seg depression, with sx of SOB). Perfusion scan--no definite ischemia (breast attenuation noted), considered alow risk scan. Echo showed mild-mod tricuspid regurg. She last saw him in December, 2017.  He felt that her DOE was likely related to mild underlying COPD.  He will see her again in 2 years, sooner if needed.  She walks with a friend in the neighborhood, talking the whole time, and  sometimes notes some shortness of breath, just slight (when talking and walking).  No longer having any pain in the shoulderblades like she used to.     She saw Dr. Reather Littler PA for back pain, did xray, has DDD, treated with 6d of prednisone. She found out for the first time that she has scoliosis.  Pain resolved.  She is going to the [facility] twice/week, silver sneakers, cardio and machines, in addition to walking with her friend at least 4-5 days/week.  Osteoporosis: She was started on Boniva in May 2017, after her DEXA done 12/10/15 showed T -2.5 at R fem neck. She is tolerating this without side effects. Denies dysphagia or chest pain.  Some recent reflux as stated above.  PMH, PSH, SH reviewed  Outpatient Encounter Medications as of 06/15/2017  Medication Sig Note  . aspirin EC 81 MG tablet Take 81 mg by mouth daily.   . Calcium Citrate-Vitamin D (CALCIUM + D PO) Take 1 tablet by mouth daily.   [name] conjugated estrogens (PREMARIN) vaginal cream Apply cream to urethral area daily for 1-2 weeks, then 2-3x/week. May use intravaginally 2x/week   . ibandronate (BONIVA) 150 MG tablet Take in morning once monthly, with a full glass of water, on empty stomach, and nothing else by mouth or lie down for the next 30 min.   [name] lisinopril-hydrochlorothiazide (PRINZIDE,ZESTORETIC) 20-12.5 MG tablet Take 1 tablet daily by mouth.   . Multiple Vitamins-Minerals (MULTIVITAMIN WITH MINERALS) tablet Take 1 tablet by mouth daily.   . simvastatin (ZOCOR) 40 MG tablet Take 1 tablet (40 mg total) at bedtime by mouth.   . trimethoprim (TRIMPEX) 100 MG tablet Take 100 mg by mouth daily. 05/01/2015: Per Dr. McDiarmid for UTI prevention, started 04/2015  . vitamin E 100 UNIT capsule Take 100 Units by mouth daily.   . [DISCONTINUED] lisinopril-hydrochlorothiazide (PRINZIDE,ZESTORETIC) 20-12.5 MG tablet TAKE 1 TABLET ONCE DAILY.   . [DISCONTINUED] simvastatin (ZOCOR) 40 MG tablet Take 1 tablet (40 mg total) by mouth at bedtime.     No facility-administered encounter medications on file as of 06/15/2017.    Allergies  Allergen Reactions  . Naprosyn [Naproxen] Rash   ROS:  No fever, chills, URI symptoms, headaches, dizziness, chest pain.  Dyspnea as per HPI.  Urinary complaints as per HPI.  No vaginal discharge. Moods are good. Reflux as per HPI, doesn't seem to be related to taking bisphosphonate.  Denies dyphagia or symptoms from goiter. No thyroid symptoms. L knee, left shoulder and L>R hip pain.   PHYSICAL EXAM:  BP 110/70   Pulse 76   Ht '5\' 4"'$  (1.626 m)   Wt 136 lb (61.7 kg)   BMI 23.34 kg/m    Wt Readings from Last 3 Encounters:  06/15/17 136 lb (61.7 kg)  06/11/17 137 lb 3.2 oz (62.2 kg)  03/04/17 134 lb 3.2 oz (60.9 kg)   Well developed, pleasant female in no distress HEENT: PERRL, EOMI, conjunctiva clear, OP clear Neck: no lymphadenopathy or carotid bruit. Thyroid is borderline in size, no obvious nodule or asymmetry noted. Heart: regular rate and rhythm, no murmur Lungs: clear bilaterally, good air movement. Back: no spinal or CVA tenderness Abdomen: soft, nontender, no organomegaly or mass Extremities: no edema, 2+ pulses Neuro: alert and oriented. Cranial nerves intact, normal strength, gait Psych: normal mood, affect, hygiene and grooming Skin: no  visible bruising or rashes  Lab Results  Component Value Date   HGBA1C 5.8 (H) 06/11/2017   Fasting glucose 100  Lab Results  Component Value Date   CHOL 143 06/11/2017   HDL 48 (L) 06/11/2017   LDLCALC 49 12/04/2016   TRIG 142 06/11/2017   CHOLHDL 3.0 06/11/2017   Lab Results  Component Value Date   ALT 37 (H) 06/11/2017   AST 35 06/11/2017   ALKPHOS 42 12/04/2016   BILITOT 0.4 06/11/2017   Lab Results  Component Value Date   TSH 4.12 06/11/2017    ASSESSMENT/PLAN:  Gross hematuria - resolved; suspect irritation from walk contributed, and has atrophic vaginitis. feeling better with premarin cream to urethral  area  Atrophic vaginitis - continue premarin cream to periurethral area until symptoms resolve, and prn. Will avoid intravaginal use (in larger amts) due to risk of DVT  Pure hypercholesterolemia - lipids at goal; continue current meds and lowfat, low cholesterol diet - Plan: simvastatin (ZOCOR) 40 MG tablet  Multinodular goiter - asymptomatic. Reviewed sx. Room to suppress if enlarges; discussed sx of hypothyroid as well given increase in TSH  Essential hypertension, benign - Plan: lisinopril-hydrochlorothiazide (PRINZIDE,ZESTORETIC) 20-12.5 MG tablet  Impaired fasting glucose  OAB (overactive bladder) - stable  Osteoporosis without current pathological fracture, unspecified osteoporosis type - Continue Boniva--if reflux sx worsen related to med, can consider change to reclast vs prolia. Cont Ca, D, weight-bearing exercise. DEXA due again spring 2019  F/u AWV/med check in 6 months with labs prior--c-met, TSH, lipids, CBC, A1c   Use the premarin cream daily until symptoms resolve, then just as needed. Okay to use 2-3 times/week if symptoms return.  You may try taking tylenol arthritis at bedtime to prevent the pain from waking you up at night.  (as well as icing the knee).  Continue your current medications--your blood pressure and cholesterol are good.  You have prediabetes. Limit your sweets and carbs, and continue regular exercise.  Pay attention as to whether your reflux symptoms get worse when you take your Boniva.  If so, try and take the nexium prior to your next dose, and continue for the next few days, if needed. If it becomes an ongoing problem, each month, we can consider changing your osteoporosis therapy. Continue to try and limit the reflux-inducing foods in your diet (see handout).

## 2017-06-15 ENCOUNTER — Encounter: Payer: Self-pay | Admitting: Family Medicine

## 2017-06-15 ENCOUNTER — Ambulatory Visit (INDEPENDENT_AMBULATORY_CARE_PROVIDER_SITE_OTHER): Payer: Medicare Other | Admitting: Family Medicine

## 2017-06-15 VITALS — BP 110/70 | HR 76 | Ht 64.0 in | Wt 136.0 lb

## 2017-06-15 DIAGNOSIS — Z5181 Encounter for therapeutic drug level monitoring: Secondary | ICD-10-CM

## 2017-06-15 DIAGNOSIS — N3281 Overactive bladder: Secondary | ICD-10-CM | POA: Diagnosis not present

## 2017-06-15 DIAGNOSIS — R7301 Impaired fasting glucose: Secondary | ICD-10-CM

## 2017-06-15 DIAGNOSIS — I10 Essential (primary) hypertension: Secondary | ICD-10-CM | POA: Diagnosis not present

## 2017-06-15 DIAGNOSIS — R310 Gross hematuria: Secondary | ICD-10-CM

## 2017-06-15 DIAGNOSIS — M810 Age-related osteoporosis without current pathological fracture: Secondary | ICD-10-CM | POA: Diagnosis not present

## 2017-06-15 DIAGNOSIS — N952 Postmenopausal atrophic vaginitis: Secondary | ICD-10-CM | POA: Diagnosis not present

## 2017-06-15 DIAGNOSIS — E042 Nontoxic multinodular goiter: Secondary | ICD-10-CM

## 2017-06-15 DIAGNOSIS — E7800 Pure hypercholesterolemia, unspecified: Secondary | ICD-10-CM

## 2017-06-15 MED ORDER — LISINOPRIL-HYDROCHLOROTHIAZIDE 20-12.5 MG PO TABS: 1.0000 | ORAL_TABLET | Freq: Every day | ORAL | 1 refills | Status: DC

## 2017-06-15 MED ORDER — SIMVASTATIN 40 MG PO TABS: 40.0000 mg | ORAL_TABLET | Freq: Every day | ORAL | 1 refills | Status: DC

## 2017-06-15 NOTE — Patient Instructions (Signed)
Use the premarin cream daily until symptoms resolve, then just as needed. Okay to use 2-3 times/week if symptoms return.  You may try taking tylenol arthritis at bedtime to prevent the pain from waking you up at night.  (as well as icing the knee).  Continue your current medications--your blood pressure and cholesterol are good.  You have prediabetes. Limit your sweets and carbs, and continue regular exercise.  Pay attention as to whether your reflux symptoms get worse when you take your Boniva.  If so, try and take the nexium prior to your next dose, and continue for the next few days, if needed. If it becomes an ongoing problem, each month, we can consider changing your osteoporosis therapy. Continue to try and limit the reflux-inducing foods in your diet (see handout).     Food Choices for Gastroesophageal Reflux Disease, Adult When you have gastroesophageal reflux disease (GERD), the foods you eat and your eating habits are very important. Choosing the right foods can help ease the discomfort of GERD. Consider working with a diet and nutrition specialist (dietitian) to help you make healthy food choices. What general guidelines should I follow? Eating plan  Choose healthy foods low in fat, such as fruits, vegetables, whole grains, low-fat dairy products, and lean meat, fish, and poultry.  Eat frequent, small meals instead of three large meals each day. Eat your meals slowly, in a relaxed setting. Avoid bending over or lying down until 2-3 hours after eating.  Limit high-fat foods such as fatty meats or fried foods.  Limit your intake of oils, butter, and shortening to less than 8 teaspoons each day.  Avoid the following: ? Foods that cause symptoms. These may be different for different people. Keep a food diary to keep track of foods that cause symptoms. ? Alcohol. ? Drinking large amounts of liquid with meals. ? Eating meals during the 2-3 hours before bed.  Cook foods using  methods other than frying. This may include baking, grilling, or broiling. Lifestyle   Maintain a healthy weight. Ask your health care provider what weight is healthy for you. If you need to lose weight, work with your health care provider to do so safely.  Exercise for at least 30 minutes on 5 or more days each week, or as told by your health care provider.  Avoid wearing clothes that fit tightly around your waist and chest.  Do not use any products that contain nicotine or tobacco, such as cigarettes and e-cigarettes. If you need help quitting, ask your health care provider.  Sleep with the head of your bed raised. Use a wedge under the mattress or blocks under the bed frame to raise the head of the bed. What foods are not recommended? The items listed may not be a complete list. Talk with your dietitian about what dietary choices are best for you. Grains Pastries or quick breads with added fat. [place] toast. Vegetables Deep fried vegetables. [place] fries. Any vegetables prepared with added fat. Any vegetables that cause symptoms. For some people this may include tomatoes and tomato products, chili peppers, onions and garlic, and horseradish. Fruits Any fruits prepared with added fat. Any fruits that cause symptoms. For some people this may include citrus fruits, such as oranges, grapefruit, pineapple, and lemons. Meats and other protein foods High-fat meats, such as fatty beef or pork, hot dogs, ribs, ham, sausage, salami and bacon. Fried meat or protein, including fried fish and fried chicken. Nuts and nut butters. Dairy Whole milk  and chocolate milk. Sour cream. Cream. Ice cream. Cream cheese. Milk shakes. Beverages Coffee and tea, with or without caffeine. Carbonated beverages. Sodas. Energy drinks. Fruit juice made with acidic fruits (such as orange or grapefruit). Tomato juice. Alcoholic drinks. Fats and oils Butter. Margarine. Shortening. Ghee. Sweets and desserts Chocolate and  cocoa. Donuts. Seasoning and other foods Pepper. Peppermint and spearmint. Any condiments, herbs, or seasonings that cause symptoms. For some people, this may include curry, hot sauce, or vinegar-based salad dressings. Summary  When you have gastroesophageal reflux disease (GERD), food and lifestyle choices are very important to help ease the discomfort of GERD.  Eat frequent, small meals instead of three large meals each day. Eat your meals slowly, in a relaxed setting. Avoid bending over or lying down until 2-3 hours after eating.  Limit high-fat foods such as fatty meat or fried foods. This information is not intended to replace advice given to you by your health care provider. Make sure you discuss any questions you have with your health care provider. Document Released: 07/28/2005 Document Revised: 07/29/2016 Document Reviewed: 07/29/2016 Elsevier Interactive Patient Education  2017 [employer].

## 2017-06-17 ENCOUNTER — Encounter: Payer: Self-pay | Admitting: Family Medicine

## 2017-06-22 ENCOUNTER — Encounter: Payer: Self-pay | Admitting: Family Medicine

## 2017-06-23 DIAGNOSIS — R300 Dysuria: Secondary | ICD-10-CM | POA: Diagnosis not present

## 2017-06-23 DIAGNOSIS — N3020 Other chronic cystitis without hematuria: Secondary | ICD-10-CM | POA: Diagnosis not present

## 2017-07-29 DIAGNOSIS — R350 Frequency of micturition: Secondary | ICD-10-CM | POA: Diagnosis not present

## 2017-07-29 DIAGNOSIS — N3020 Other chronic cystitis without hematuria: Secondary | ICD-10-CM | POA: Diagnosis not present

## 2017-07-29 DIAGNOSIS — R300 Dysuria: Secondary | ICD-10-CM | POA: Diagnosis not present

## 2017-08-12 DIAGNOSIS — M1712 Unilateral primary osteoarthritis, left knee: Secondary | ICD-10-CM | POA: Diagnosis not present

## 2017-08-12 DIAGNOSIS — M25562 Pain in left knee: Secondary | ICD-10-CM | POA: Diagnosis not present

## 2017-08-12 DIAGNOSIS — M179 Osteoarthritis of knee, unspecified: Secondary | ICD-10-CM | POA: Insufficient documentation

## 2017-08-13 ENCOUNTER — Telehealth: Payer: Self-pay | Admitting: Family Medicine

## 2017-08-13 NOTE — Telephone Encounter (Signed)
Called pt, she doesn't have form yet or surgery date.  Sched for next week.  She will call us back if this will not work.

## 2017-08-13 NOTE — Telephone Encounter (Signed)
Pt called and states she needs to have knee surgery per Dr. Alvan Dame [facility].  And that there may be a form for surgical clearance.  Do you need to see her?  Pt ph [phone]

## 2017-08-13 NOTE — Telephone Encounter (Signed)
Needs visit with form available

## 2017-08-19 ENCOUNTER — Ambulatory Visit (INDEPENDENT_AMBULATORY_CARE_PROVIDER_SITE_OTHER): Payer: Medicare Other | Admitting: Family Medicine

## 2017-08-19 ENCOUNTER — Encounter: Payer: Self-pay | Admitting: Family Medicine

## 2017-08-19 VITALS — BP 130/70 | HR 72 | Temp 98.3°F | Ht 64.0 in | Wt 138.0 lb

## 2017-08-19 DIAGNOSIS — Z01810 Encounter for preprocedural cardiovascular examination: Secondary | ICD-10-CM | POA: Diagnosis not present

## 2017-08-19 DIAGNOSIS — Z86718 Personal history of other venous thrombosis and embolism: Secondary | ICD-10-CM | POA: Diagnosis not present

## 2017-08-19 DIAGNOSIS — D6851 Activated protein C resistance: Secondary | ICD-10-CM

## 2017-08-19 DIAGNOSIS — J069 Acute upper respiratory infection, unspecified: Secondary | ICD-10-CM | POA: Diagnosis not present

## 2017-08-19 DIAGNOSIS — I10 Essential (primary) hypertension: Secondary | ICD-10-CM

## 2017-08-19 DIAGNOSIS — E7800 Pure hypercholesterolemia, unspecified: Secondary | ICD-10-CM | POA: Diagnosis not present

## 2017-08-19 NOTE — Progress Notes (Signed)
Chief Complaint  Patient presents with  . Medical Clearance    for left knee replacement-Dr Alvan Dame.   . Cough    started last Friday. Has been bringing up some clear mucus. Had fevers up until yesterday. No body aches.    Planning for left knee replacement with Dr. Alvan Dame, scheduled for 09/21/17.  When she saw Dr. Alvan Dame in April, she wasn't ready yet for surgery. In November she went to [facility] and she had a lot of knee pain.  When she drove to [facility] to see her daughter a few months ago, her knee really bothered her. She realized how it was affecting her travel and quality of life, so is now ready for knee replacement. She has tried injections/other treatments without lasting benefit.    She has been walking 2-2.5 miles most days of the week, weather-permitting. She is also going to the [facility], working with a Clinical research associate, and has a routine set up.  Also does active adult cardio class.  She recently stopped due to URI symptoms, but otherwise was participating in these exercises without any chest pain, shortness of breath or other problems. Some knee discomfort, but tolerable.  Denies knee giving way.  Vertigo kicks in with certain positions (when on the floor, for yoga).  She fell once, in the process of standing up.  Seems to trigger when she leans her head back. She has been avoiding those floor positions since then.  She had some SOB when walking in hot/humid weather back in 2017, and saw cardiologist.  She has not had any further problems with shortness of breath since 2017. 03/31/16 nuclear stress test Dr. Perrin Smack EKG +myocardial ischemia (65mm downsloping ST seg depression, with sx of SOB). Perfusion scan--no definite ischemia, considered low risk scan. She hasn't seen Dr. Einar Gip since 07/2016, hasn't had any problems.  She has h/o DVT in 07/2013, heterozygous for Factor V Leiden mutation.  No recent problems.  5 days ago she started with sore throat, cough, postnasal drainage, some runny nose.  She is  blowing her nose a lot, mucus is clear. Phlegm is clear, not getting much up.  She had some fevers early on, up to 100.9.  Fever resolved, didn't have any yesterday.  +sick contact--her husband had been sick. She feels like she has gotten better over the last 1-2 days.  She has been using a robitussin multisymptom cough medication, which helped some.  Last dose was yesterday morning.   PMH, PSH, SH reviewed  Outpatient Encounter Medications as of 08/19/2017  Medication Sig Note  . aspirin EC 81 MG tablet Take 81 mg by mouth daily.   . Calcium Citrate-Vitamin D (CALCIUM + D PO) Take 1 tablet by mouth daily.   [name] conjugated estrogens (PREMARIN) vaginal cream Apply cream to urethral area daily for 1-2 weeks, then 2-3x/week. May use intravaginally 2x/week   . ibandronate (BONIVA) 150 MG tablet Take in morning once monthly, with a full glass of water, on empty stomach, and nothing else by mouth or lie down for the next 30 min.   [name] lisinopril-hydrochlorothiazide (PRINZIDE,ZESTORETIC) 20-12.5 MG tablet Take 1 tablet daily by mouth.   . Multiple Vitamins-Minerals (MULTIVITAMIN WITH MINERALS) tablet Take 1 tablet by mouth daily.   . simvastatin (ZOCOR) 40 MG tablet Take 1 tablet (40 mg total) at bedtime by mouth.   . trimethoprim (TRIMPEX) 100 MG tablet Take 100 mg by mouth daily. 05/01/2015: Per Dr. McDiarmid for UTI prevention, started 04/2015  . vitamin E 100 UNIT  capsule Take 100 Units by mouth daily.    No facility-administered encounter medications on file as of 08/19/2017.    Allergies  Allergen Reactions  . Naprosyn [Naproxen] Rash   ROS: Fevers resolved.  Denies headaches, syncope.  Some positional vertigo (see above), no other dizziness.  No nausea, vomiting, diarrhea, abdominal, pain, urinary complaints (chronic, unchanged--having leakage with her coughing). She had an episode of breakthrough infection, treated by Dr. Wendy Poet, doing well on trimethoprim since last infection.  No bleeding,  bruising, rash. No chest pain, shortness of breath/DOE, GI symptoms.  L knee pain, per HPI; some shoulder pain bilaterally (h/o frozen shoulders, has HEP)  PHYSICAL EXAM: BP 130/70   Pulse 72   Temp 98.3 F (36.8 C) (Tympanic)   Ht 5\' 4"  (1.626 m)   Wt 138 lb (62.6 kg)   BMI 23.69 kg/m   Well appearing, pleasant female in no distress HEENT: PERRL, EOMI, conjunctiva and sclera are clear. TM's and EAC's normal. Nasal mucosa is mildly edematous, clear mucus noted. No erythema or purulence, no sinus tenderness. OP is clear Neck: no lymphadenopathy, thyromegaly or carotid bruit Heart: regular rate and rhythm, no murmur, rub, gallop Lungs: clear bilaterally, no wheezes, rales, ronchi Back: no spinal or CVA tenderness Chest: nontender Abdomen: soft, nontender, no organomegaly or mass Extremities: no edema, normal pulses Skin: normal turgor, no rash Psych: normal mood, affect, hygiene and grooming Neuro: alert and oriented, cranial nerves intact, normal gait.   ASSESSMENT/PLAN:  Viral upper respiratory tract infection - supportive measures reviewed, and s/sx bacterial infection  Encounter for pre-operative cardiovascular clearance - Cleared for surgery  Essential hypertension, benign - controlled  Pure hypercholesterolemia - on statin  History of DVT (deep vein thrombosis) - on 81mg  aspirin.  Ortho needs to be aware of increased risk, to prevent DVT postoperatively  Heterozygous factor V Leiden mutation ([place])   Cleared for surgery with special attention to increased risk for DVT   Dr. Einar Gip wanted her back for 2 year f/u (due 07/2018), pt advised.  Obviously sooner if any issues.  Continue current meds.

## 2017-08-19 NOTE — Patient Instructions (Addendum)
Continue to drink plenty of fluids. There is no evidence of a bacterial infection, just viral. If your mucus becomes discolored, if you develop recurrent fevers, or other worsening symptoms, please let us know.  You may use guaifenesin (mucinex/robitussin) as needed for cough, and to thin out any thick phlegm/mucus. Consider sinus rinses if you develop sinus pain.  You are cleared for surgery based on stress test from 2017 and lack of any further symptoms.  You are at increased risk for blood clots (heterozygous for Factor V Leiden mutation, you had history of DVT)--I will make sure orthopedist is aware, in order to prevent post-operative DVT.  Continue your current medications. You will be due to follow up with Dr. Einar Gip in 07/2018. Your carotids sounded clear--can wait for ultrasound until you see/discuss with him.

## 2017-08-21 DIAGNOSIS — M1712 Unilateral primary osteoarthritis, left knee: Secondary | ICD-10-CM | POA: Diagnosis not present

## 2017-08-21 DIAGNOSIS — M25562 Pain in left knee: Secondary | ICD-10-CM | POA: Diagnosis not present

## 2017-08-24 ENCOUNTER — Telehealth: Payer: Self-pay | Admitting: Family Medicine

## 2017-08-24 NOTE — Telephone Encounter (Signed)
FFO

## 2017-08-24 NOTE — Telephone Encounter (Signed)
Pt dropped off surgical clearance form to be filled out put in your folder to be signed pt can be reached at [phone]

## 2017-09-01 DIAGNOSIS — N3942 Incontinence without sensory awareness: Secondary | ICD-10-CM | POA: Diagnosis not present

## 2017-09-01 DIAGNOSIS — N3020 Other chronic cystitis without hematuria: Secondary | ICD-10-CM | POA: Diagnosis not present

## 2017-09-10 NOTE — H&P (Signed)
TOTAL KNEE ADMISSION H&P  Patient is being admitted for left total knee arthroplasty.  Subjective:  Chief Complaint:   Left knee primary OA / pain  HPI: Dorothy Rojas, 72 y.o. female, has a history of pain and functional disability in the left knee due to arthritis and has failed non-surgical conservative treatments for greater than 12 weeks to include NSAID's and/or analgesics, corticosteriod injections, viscosupplementation injections and activity modification.  Onset of symptoms was gradual, starting >10 years ago with gradually worsening course since that time. The patient noted no past surgery on the left knee(s).  Patient currently rates pain in the left knee(s) at 10 out of 10 with activity. Patient has night pain, worsening of pain with activity and weight bearing, pain that interferes with activities of daily living, pain with passive range of motion, crepitus and joint swelling.  Patient has evidence of periarticular osteophytes and joint space narrowing by imaging studies.  There is no active infection.   Risks, benefits and expectations were discussed with the patient.  Risks including but not limited to the risk of anesthesia, blood clots, nerve damage, blood vessel damage, failure of the prosthesis, infection and up to and including death.  Patient understand the risks, benefits and expectations and wishes to proceed with surgery.   PCP: [name], MD  D/C Plans:       Home  Post-op Meds:       No Rx given  Tranexamic Acid:      To be given -  Topically (hx of DVT)  Decadron:      Is to be given  FYI:     Xarelto for 1 month then ASA (Factor V)  Norco  DME:   Rx given for - RW   PT:   OPPT Rx given   Patient Active Problem List   Diagnosis Date Noted  . Abnormal cardiovascular stress test 04/23/2016  . Osteoporosis 12/13/2015  . Tubular adenoma of colon 06/26/2015  . Esophageal reflux 11/01/2014  . Impaired fasting glucose 11/01/2014  . CN (constipation)  11/01/2014  . Advance care planning 11/01/2014  . Chest pain with moderate risk for cardiac etiology 05/04/2014  . Heterozygous factor V Leiden mutation ([place]) 08/08/2013  . OAB (overactive bladder) 04/14/2013  . Osteopenia 04/14/2013  . Multinodular goiter 10/13/2012  . Carotid stenosis 10/13/2012  . Essential hypertension, benign 04/09/2011  . Pure hypercholesterolemia 04/09/2011   Past Medical History:  Diagnosis Date  . Anemia   . Arthritis    knees  . Chronic constipation   . Colon polyp 06/2015   tubular adenoma (sigmoid)  . Diverticulosis    seen on colonoscopy 06/2015  . GERD (gastroesophageal reflux disease)    normal EGD 06/2015  . Heterozygous factor V Leiden mutation [facility]) dx 07/2013     hematologist--  dr Bernadene Bell ([facility])--  per note "Low Risk"  . History of DVT of lower extremity    dx 07-28-2013  left lower extrem.  --  xarelto for 6 months  . Hypertension   . Lymphadenopathy, inguinal    bilateral  . Microscopic hematuria    chronic   . Multinodular goiter   . Multinodular thyroid    a. Path benign 2013.  [name] OAB (overactive bladder)   . Osteopenia    last DEXA 10/2009  . Prediabetes   . SUI (stress urinary incontinence, female)   . Tricuspid regurgitation    mild-mod, noted on echo 03/2016    Past Surgical  History:  Procedure Laterality Date  . APPENDECTOMY  1973  . CATARACT EXTRACTION W/ INTRAOCULAR LENS  IMPLANT, BILATERAL Bilateral 2015  . CYSTOCELE REPAIR  12-03-2010  . CYSTOSCOPY MACROPLASTIQUE IMPLANT N/A 12/12/2014   Procedure: CYSTOSCOPY MACROPLASTIQUE INJECTION;  Surgeon: Bjorn Loser, MD;  Location[facility];  Service: Urology;  Laterality: N/A;  . CYSTOSCOPY MACROPLASTIQUE IMPLANT N/A 04/12/2015   Procedure: CYSTOSCOPY MACROPLASTIQUE IMPLANT;  Surgeon: Bjorn Loser, MD;  Location: [facility];  Service: Urology;  Laterality: N/A;  . INCONTINENCE SURGERY  04/1999   Re-do at [facility] for sling  complications in 1740  . KNEE ARTHROSCOPY Right 04-05-2013  . LATERAL EPICONDYLE RELEASE Right   . NEUROMA SURGERY Bilateral 1980's    feet  . OVARIAN CYST REMOVAL Left 1973  . PUBOVAGINAL SLING N/A 07/30/2015   Procedure: PELVIC EXAM UNDER ANESTHESIA, TRANSVAGINAL BIOPSY OF PERIURETHRAL MASS;  Surgeon: Raynelle Bring, MD;  Location: [facility];  Service: Urology;  Laterality: N/A;  26 MINS   . TONSILLECTOMY  as child  . TOTAL ABDOMINAL HYSTERECTOMY W/ BILATERAL SALPINGOOPHORECTOMY  09/  2000  . TRANSTHORACIC ECHOCARDIOGRAM  05-04-2014   normal ,  ef 60-65%    No current facility-administered medications for this encounter.    Current Outpatient Medications  Medication Sig Dispense Refill Last Dose  . acetaminophen (TYLENOL) 500 MG tablet Take 1,000 mg by mouth 3 (three) times daily as needed for moderate pain or headache.     [name] aspirin EC 81 MG tablet Take 81 mg by mouth daily.   Taking  . Calcium Citrate-Vitamin D (CALCIUM + D PO) Take 1 tablet by mouth daily.   Taking  . ciprofloxacin (CIPRO) 250 MG tablet Take 250 mg by mouth 2 (two) times daily.     [name] conjugated estrogens (PREMARIN) vaginal cream Apply cream to urethral area daily for 1-2 weeks, then 2-3x/week. May use intravaginally 2x/week (Patient taking differently: Place 1 Applicatorful vaginally once a week. ) 42.5 g 2 Taking  . esomeprazole (NEXIUM) 20 MG capsule Take 20 mg by mouth daily as needed (acid reflux).     . ibandronate (BONIVA) 150 MG tablet Take in morning once monthly, with a full glass of water, on empty stomach, and nothing else by mouth or lie down for the next 30 min. 3 tablet 3 Taking  . ibuprofen (ADVIL,MOTRIN) 200 MG tablet Take 400 mg by mouth 3 (three) times daily as needed for headache or moderate pain.     [name] lisinopril-hydrochlorothiazide (PRINZIDE,ZESTORETIC) 20-12.5 MG tablet Take 1 tablet daily by mouth. 90 tablet 1 Taking  . Multiple Vitamins-Minerals (MULTIVITAMIN WITH MINERALS) tablet Take 1 tablet by  mouth daily.   Taking  . simvastatin (ZOCOR) 40 MG tablet Take 1 tablet (40 mg total) at bedtime by mouth. 90 tablet 1 Taking  . vitamin E 100 UNIT capsule Take 100 Units by mouth daily.   Taking  . nitrofurantoin (MACRODANTIN) 100 MG capsule Take 100 mg by mouth daily.      Allergies  Allergen Reactions  . Naprosyn [Naproxen] Rash    Social History   Tobacco Use  . Smoking status: Former Smoker    Packs/day: 1.00    Years: 21.00    Pack years: 21.00    Types: Cigarettes    Last attempt to quit: 08/12/1983    Years since quitting: 34.1  . Smokeless tobacco: Never Used  Substance Use Topics  . Alcohol use: Yes    Alcohol/week: 0.0 oz  Comment: 1 glass of wine most days of the week    Family History  Problem Relation Age of Onset  . Hypertension Father   . Heart disease Father 78       CABG, died from MI at 11  . COPD Father   . Cirrhosis Mother        related to psoriasis medications  . Diabetes Mother   . Psoriasis Mother   . Osteogenesis imperfecta Sister   . Heart disease Sister        CHF  . Diabetes Brother   . Heart disease Brother 43       MI  . Deep vein thrombosis Brother        factor V Leiden NEGATIVE  . Cancer Neg Hx      Review of Systems  Constitutional: Negative.   HENT: Negative.   Eyes: Negative.   Respiratory: Negative.   Cardiovascular: Negative.   Gastrointestinal: Positive for heartburn.  Genitourinary: Positive for frequency and urgency.  Musculoskeletal: Positive for joint pain.  Skin: Negative.   Neurological: Negative.   Endo/Heme/Allergies: Negative.   Psychiatric/Behavioral: Negative.     Objective:  Physical Exam  Constitutional: She is oriented to person, place, and time. She appears well-developed.  HENT:  Head: Normocephalic.  Eyes: Pupils are equal, round, and reactive to light.  Neck: Neck supple. No JVD present. No tracheal deviation present. No thyromegaly present.  Cardiovascular: Normal rate, regular rhythm and  intact distal pulses.  Respiratory: Effort normal and breath sounds normal. No respiratory distress. She has no wheezes.  GI: Soft. There is no tenderness. There is no guarding.  Musculoskeletal:       Left knee: She exhibits decreased range of motion, swelling and bony tenderness. She exhibits no ecchymosis, no deformity, no laceration and no erythema. Tenderness found.  Lymphadenopathy:    She has no cervical adenopathy.  Neurological: She is alert and oriented to person, place, and time.  Skin: Skin is warm and dry.  Psychiatric: She has a normal mood and affect.      Labs:  Estimated body mass index is 23.69 kg/m as calculated from the following:   Height as of 08/19/17: 5\' 4"  (1.626 m).   Weight as of 08/19/17: 62.6 kg (138 lb).   Imaging Review Plain radiographs demonstrate severe degenerative joint disease of the left knee(s).  The bone quality appears to be good for age and reported activity level.  Assessment/Plan:  End stage arthritis, left knee   The patient history, physical examination, clinical judgment of the provider and imaging studies are consistent with end stage degenerative joint disease of the left knee(s) and total knee arthroplasty is deemed medically necessary. The treatment options including medical management, injection therapy arthroscopy and arthroplasty were discussed at length. The risks and benefits of total knee arthroplasty were presented and reviewed. The risks due to aseptic loosening, infection, stiffness, patella tracking problems, thromboembolic complications and other imponderables were discussed. The patient acknowledged the explanation, agreed to proceed with the plan and consent was signed. Patient is being admitted for inpatient treatment for surgery, pain control, PT, OT, prophylactic antibiotics, VTE prophylaxis, progressive ambulation and ADL's and discharge planning. The patient is planning to be discharged home.      West Pugh Sherene Plancarte    PA-C  09/10/2017, 10:02 AM

## 2017-09-11 ENCOUNTER — Other Ambulatory Visit (HOSPITAL_COMMUNITY): Payer: Self-pay | Admitting: Emergency Medicine

## 2017-09-11 NOTE — Progress Notes (Signed)
Surgical clearance Dr Tomi Bamberger 08-24-17 on chart   Pre-operative cardiovascular clearance office visit Dr [name] 08-19-17 epic   Stress test 03-31-16 epic

## 2017-09-11 NOTE — Patient Instructions (Addendum)
Dorothy Rojas  09/11/2017   Your procedure is scheduled on: 09-21-17   Report to [facility] Main  Entrance    Report to admitting at 9:30AM   Call this number if you have problems the morning of surgery [phone]   Remember: Do not eat food or drink liquids :After Midnight.     Take these medicines the morning of surgery with A SIP OF WATER: esomeprazole(nexium) if needed, tylenol if needed, macrodantin                                 You may not have any metal on your body including hair pins and              piercings  Do not wear jewelry, make-up, lotions, powders or perfumes, deodorant             Do not wear nail polish.  Do not shave  48 hours prior to surgery.     Do not bring valuables to the hospital. [place].  Contacts, dentures or bridgework may not be worn into surgery.  Leave suitcase in the car. After surgery it may be brought to your room.                 Please read over the following fact sheets you were given: _____________________________________________________________________             [facility] - Preparing for Surgery Before surgery, you can play an important role.  Because skin is not sterile, your skin needs to be as free of germs as possible.  You can reduce the number of germs on your skin by washing with CHG (chlorahexidine gluconate) soap before surgery.  CHG is an antiseptic cleaner which kills germs and bonds with the skin to continue killing germs even after washing. Please DO NOT use if you have an allergy to CHG or antibacterial soaps.  If your skin becomes reddened/irritated stop using the CHG and inform your nurse when you arrive at Short Stay. Do not shave (including legs and underarms) for at least 48 hours prior to the first CHG shower.  You may shave your face/neck. Please follow these instructions carefully:  1.  Shower with CHG Soap the night before  surgery and the  morning of Surgery.  2.  If you choose to wash your hair, wash your hair first as usual with your  normal  shampoo.  3.  After you shampoo, rinse your hair and body thoroughly to remove the  shampoo.                           4.  Use CHG as you would any other liquid soap.  You can apply chg directly  to the skin and wash                       Gently with a scrungie or clean washcloth.  5.  Apply the CHG Soap to your body ONLY FROM THE NECK DOWN.   Do not use on face/ open  Wound or open sores. Avoid contact with eyes, ears mouth and genitals (private parts).                       Wash face,  Genitals (private parts) with your normal soap.             6.  Wash thoroughly, paying special attention to the area where your surgery  will be performed.  7.  Thoroughly rinse your body with warm water from the neck down.  8.  DO NOT shower/wash with your normal soap after using and rinsing off  the CHG Soap.                9.  Pat yourself dry with a clean towel.            10.  Wear clean pajamas.            11.  Place clean sheets on your bed the night of your first shower and do not  sleep with pets. Day of Surgery : Do not apply any lotions/deodorants the morning of surgery.  Please wear clean clothes to the hospital/surgery center.  FAILURE TO FOLLOW THESE INSTRUCTIONS MAY RESULT IN THE CANCELLATION OF YOUR SURGERY PATIENT SIGNATURE_________________________________  NURSE SIGNATURE__________________________________    [facility] - Preparing for Surgery Before surgery, you can play an important role.  Because skin is not sterile, your skin needs to be as free of germs as possible.  You can reduce the number of germs on your skin by washing with CHG (chlorahexidine gluconate) soap before surgery.  CHG is an antiseptic cleaner which kills germs and bonds with the skin to continue killing germs even after washing. Please DO NOT use if you have an allergy  to CHG or antibacterial soaps.  If your skin becomes reddened/irritated stop using the CHG and inform your nurse when you arrive at Short Stay. Do not shave (including legs and underarms) for at least 48 hours prior to the first CHG shower.  You may shave your face/neck. Please follow these instructions carefully:  1.  Shower with CHG Soap the night before surgery and the  morning of Surgery.  2.  If you choose to wash your hair, wash your hair first as usual with your  normal  shampoo.  3.  After you shampoo, rinse your hair and body thoroughly to remove the  shampoo.                           4.  Use CHG as you would any other liquid soap.  You can apply chg directly  to the skin and wash                       Gently with a scrungie or clean washcloth.  5.  Apply the CHG Soap to your body ONLY FROM THE NECK DOWN.   Do not use on face/ open                           Wound or open sores. Avoid contact with eyes, ears mouth and genitals (private parts).                       Wash face,  Genitals (private parts) with your normal soap.  6.  Wash thoroughly, paying special attention to the area where your surgery  will be performed.  7.  Thoroughly rinse your body with warm water from the neck down.  8.  DO NOT shower/wash with your normal soap after using and rinsing off  the CHG Soap.                9.  Pat yourself dry with a clean towel.            10.  Wear clean pajamas.            11.  Place clean sheets on your bed the night of your first shower and do not  sleep with pets. Day of Surgery : Do not apply any lotions/deodorants the morning of surgery.  Please wear clean clothes to the hospital/surgery center.  FAILURE TO FOLLOW THESE INSTRUCTIONS MAY RESULT IN THE CANCELLATION OF YOUR SURGERY PATIENT SIGNATURE_________________________________  NURSE SIGNATURE__________________________________  ________________________________________________________________________   Dorothy Rojas  An incentive spirometer is a tool that can help keep your lungs clear and active. This tool measures how well you are filling your lungs with each breath. Taking long deep breaths may help reverse or decrease the chance of developing breathing (pulmonary) problems (especially infection) following:  A long period of time when you are unable to move or be active. BEFORE THE PROCEDURE   If the spirometer includes an indicator to show your best effort, your nurse or respiratory therapist will set it to a desired goal.  If possible, sit up straight or lean slightly forward. Try not to slouch.  Hold the incentive spirometer in an upright position. INSTRUCTIONS FOR USE  1. Sit on the edge of your bed if possible, or sit up as far as you can in bed or on a chair. 2. Hold the incentive spirometer in an upright position. 3. Breathe out normally. 4. Place the mouthpiece in your mouth and seal your lips tightly around it. 5. Breathe in slowly and as deeply as possible, raising the piston or the ball toward the top of the column. 6. Hold your breath for 3-5 seconds or for as long as possible. Allow the piston or ball to fall to the bottom of the column. 7. Remove the mouthpiece from your mouth and breathe out normally. 8. Rest for a few seconds and repeat Steps 1 through 7 at least 10 times every 1-2 hours when you are awake. Take your time and take a few normal breaths between deep breaths. 9. The spirometer may include an indicator to show your best effort. Use the indicator as a goal to work toward during each repetition. 10. After each set of 10 deep breaths, practice coughing to be sure your lungs are clear. If you have an incision (the cut made at the time of surgery), support your incision when coughing by placing a pillow or rolled up towels firmly against it. Once you are able to get out of bed, walk around indoors and cough well. You may stop using the incentive spirometer when  instructed by your caregiver.  RISKS AND COMPLICATIONS  Take your time so you do not get dizzy or light-headed.  If you are in pain, you may need to take or ask for pain medication before doing incentive spirometry. It is harder to take a deep breath if you are having pain. AFTER USE  Rest and breathe slowly and easily.  It can be helpful to keep track of a log of  your progress. Your caregiver can provide you with a simple table to help with this. If you are using the spirometer at home, follow these instructions: [place] IF:   You are having difficultly using the spirometer.  You have trouble using the spirometer as often as instructed.  Your pain medication is not giving enough relief while using the spirometer.  You develop fever of 100.5 F (38.1 C) or higher. SEEK IMMEDIATE MEDICAL CARE IF:   You cough up bloody sputum that had not been present before.  You develop fever of 102 F (38.9 C) or greater.  You develop worsening pain at or near the incision site. MAKE SURE YOU:   Understand these instructions.  Will watch your condition.  Will get help right away if you are not doing well or get worse. Document Released: 12/08/2006 Document Revised: 10/20/2011 Document Reviewed: 02/08/2007 Exit[employer] Patient Information 2014 [employer], [place].   ________________________________________________________________________  WHAT IS A BLOOD TRANSFUSION? Blood Transfusion Information  A transfusion is the replacement of blood or some of its parts. Blood is made up of multiple cells which provide different functions.  Red blood cells carry oxygen and are used for blood loss replacement.  White blood cells fight against infection.  Platelets control bleeding.  Plasma helps clot blood.  Other blood products are available for specialized needs, such as hemophilia or other clotting disorders. BEFORE THE TRANSFUSION  Who gives blood for transfusions?   Healthy  volunteers who are fully evaluated to make sure their blood is safe. This is blood bank blood. Transfusion therapy is the safest it has ever been in the practice of medicine. Before blood is taken from a donor, a complete history is taken to make sure that person has no history of diseases nor engages in risky social behavior (examples are intravenous drug use or sexual activity with multiple partners). The donor's travel history is screened to minimize risk of transmitting infections, such as malaria. The donated blood is tested for signs of infectious diseases, such as HIV and hepatitis. The blood is then tested to be sure it is compatible with you in order to minimize the chance of a transfusion reaction. If you or a relative donates blood, this is often done in anticipation of surgery and is not appropriate for emergency situations. It takes many days to process the donated blood. RISKS AND COMPLICATIONS Although transfusion therapy is very safe and saves many lives, the main dangers of transfusion include:   Getting an infectious disease.  Developing a transfusion reaction. This is an allergic reaction to something in the blood you were given. Every precaution is taken to prevent this. The decision to have a blood transfusion has been considered carefully by your caregiver before blood is given. Blood is not given unless the benefits outweigh the risks. AFTER THE TRANSFUSION  Right after receiving a blood transfusion, you will usually feel much better and more energetic. This is especially true if your red blood cells have gotten low (anemic). The transfusion raises the level of the red blood cells which carry oxygen, and this usually causes an energy increase.  The nurse administering the transfusion will monitor you carefully for complications. HOME CARE INSTRUCTIONS  No special instructions are needed after a transfusion. You may find your energy is better. Speak with your caregiver about any  limitations on activity for underlying diseases you may have. SEEK MEDICAL CARE IF:   Your condition is not improving after your transfusion.  You develop redness  or irritation at the intravenous (IV) site. SEEK IMMEDIATE MEDICAL CARE IF:  Any of the following symptoms occur over the next 12 hours:  Shaking chills.  You have a temperature by mouth above 102 F (38.9 C), not controlled by medicine.  Chest, back, or muscle pain.  People around you feel you are not acting correctly or are confused.  Shortness of breath or difficulty breathing.  Dizziness and fainting.  You get a rash or develop hives.  You have a decrease in urine output.  Your urine turns a dark color or changes to pink, red, or brown. Any of the following symptoms occur over the next 10 days:  You have a temperature by mouth above 102 F (38.9 C), not controlled by medicine.  Shortness of breath.  Weakness after normal activity.  The [place] part of the eye turns yellow (jaundice).  You have a decrease in the amount of urine or are urinating less often.  Your urine turns a dark color or changes to pink, red, or brown. Document Released: 07/25/2000 Document Revised: 10/20/2011 Document Reviewed: 03/13/2008 [facility] Patient Information 2014 [place], [place].  _______________________________________________________________________

## 2017-09-15 ENCOUNTER — Encounter (HOSPITAL_COMMUNITY)
Admission: RE | Admit: 2017-09-15 | Discharge: 2017-09-15 | Disposition: A | Discharge status: Home / Self Care | Payer: Medicare Other | Source: Ambulatory Visit | Attending: Orthopedic Surgery | Admitting: Orthopedic Surgery

## 2017-09-15 ENCOUNTER — Other Ambulatory Visit: Payer: Self-pay

## 2017-09-15 ENCOUNTER — Encounter (HOSPITAL_COMMUNITY): Payer: Self-pay

## 2017-09-15 DIAGNOSIS — M1712 Unilateral primary osteoarthritis, left knee: Secondary | ICD-10-CM | POA: Insufficient documentation

## 2017-09-15 DIAGNOSIS — Z7901 Long term (current) use of anticoagulants: Secondary | ICD-10-CM | POA: Diagnosis not present

## 2017-09-15 DIAGNOSIS — I10 Essential (primary) hypertension: Secondary | ICD-10-CM | POA: Insufficient documentation

## 2017-09-15 DIAGNOSIS — Z79899 Other long term (current) drug therapy: Secondary | ICD-10-CM | POA: Insufficient documentation

## 2017-09-15 DIAGNOSIS — Z01818 Encounter for other preprocedural examination: Secondary | ICD-10-CM | POA: Insufficient documentation

## 2017-09-15 DIAGNOSIS — R7303 Prediabetes: Secondary | ICD-10-CM | POA: Diagnosis not present

## 2017-09-15 DIAGNOSIS — Z01810 Encounter for preprocedural cardiovascular examination: Secondary | ICD-10-CM | POA: Diagnosis not present

## 2017-09-15 HISTORY — DX: Prediabetes: R73.03

## 2017-09-15 LAB — HEMOGLOBIN A1C
Hgb A1c MFr Bld: 6.1 % — ABNORMAL HIGH (ref 4.8–5.6)
Mean Plasma Glucose: 128.37 mg/dL

## 2017-09-15 LAB — CBC
HEMATOCRIT: 35.1 % — AB (ref 36.0–46.0)
Hemoglobin: 11.7 g/dL — ABNORMAL LOW (ref 12.0–15.0)
MCH: 33.6 pg (ref 26.0–34.0)
MCHC: 33.3 g/dL (ref 30.0–36.0)
MCV: 100.9 fL — AB (ref 78.0–100.0)
PLATELETS: 213 10*3/uL (ref 150–400)
RBC: 3.48 MIL/uL — AB (ref 3.87–5.11)
RDW: 12.7 % (ref 11.5–15.5)
WBC: 6.2 10*3/uL (ref 4.0–10.5)

## 2017-09-15 LAB — BASIC METABOLIC PANEL
Anion gap: 7 (ref 5–15)
BUN: 15 mg/dL (ref 6–20)
CHLORIDE: 104 mmol/L (ref 101–111)
CO2: 31 mmol/L (ref 22–32)
Calcium: 9.3 mg/dL (ref 8.9–10.3)
Creatinine, Ser: 0.64 mg/dL (ref 0.44–1.00)
GFR calc Af Amer: >60 mL/min (ref >60)
GFR calc non Af Amer: >60 mL/min (ref >60)
Glucose, Bld: 112 mg/dL — ABNORMAL HIGH (ref 65–99)
POTASSIUM: 4 mmol/L (ref 3.5–5.1)
SODIUM: 142 mmol/L (ref 135–145)

## 2017-09-15 LAB — SURGICAL PCR SCREEN
MRSA, PCR: NEGATIVE
Staphylococcus aureus: NEGATIVE

## 2017-09-20 MED ORDER — TRANEXAMIC ACID 1000 MG/10ML IV SOLN
1000.0000 mg | INTRAVENOUS | Status: DC
  Filled 2017-09-20: qty 10

## 2017-09-21 ENCOUNTER — Encounter (HOSPITAL_COMMUNITY): Payer: Self-pay

## 2017-09-21 ENCOUNTER — Encounter (HOSPITAL_COMMUNITY)
Admission: RE | Disposition: A | Discharge status: Home / Self Care | Payer: Self-pay | Source: Ambulatory Visit | Attending: Orthopedic Surgery

## 2017-09-21 ENCOUNTER — Other Ambulatory Visit: Payer: Self-pay

## 2017-09-21 ENCOUNTER — Inpatient Hospital Stay (HOSPITAL_COMMUNITY): Payer: Medicare Other | Admitting: Anesthesiology

## 2017-09-21 ENCOUNTER — Observation Stay (HOSPITAL_COMMUNITY)
Admission: RE | Admit: 2017-09-21 | Discharge: 2017-09-22 | Disposition: A | Discharge status: Home / Self Care | Payer: Medicare Other | Source: Ambulatory Visit | Attending: Orthopedic Surgery | Admitting: Orthopedic Surgery

## 2017-09-21 DIAGNOSIS — G8918 Other acute postprocedural pain: Secondary | ICD-10-CM | POA: Diagnosis not present

## 2017-09-21 DIAGNOSIS — D649 Anemia, unspecified: Secondary | ICD-10-CM | POA: Insufficient documentation

## 2017-09-21 DIAGNOSIS — E7800 Pure hypercholesterolemia, unspecified: Secondary | ICD-10-CM | POA: Diagnosis not present

## 2017-09-21 DIAGNOSIS — E049 Nontoxic goiter, unspecified: Secondary | ICD-10-CM | POA: Insufficient documentation

## 2017-09-21 DIAGNOSIS — Z79899 Other long term (current) drug therapy: Secondary | ICD-10-CM | POA: Insufficient documentation

## 2017-09-21 DIAGNOSIS — R079 Chest pain, unspecified: Secondary | ICD-10-CM | POA: Diagnosis not present

## 2017-09-21 DIAGNOSIS — I071 Rheumatic tricuspid insufficiency: Secondary | ICD-10-CM | POA: Insufficient documentation

## 2017-09-21 DIAGNOSIS — K5790 Diverticulosis of intestine, part unspecified, without perforation or abscess without bleeding: Secondary | ICD-10-CM | POA: Insufficient documentation

## 2017-09-21 DIAGNOSIS — I10 Essential (primary) hypertension: Secondary | ICD-10-CM | POA: Diagnosis not present

## 2017-09-21 DIAGNOSIS — Z7982 Long term (current) use of aspirin: Secondary | ICD-10-CM | POA: Insufficient documentation

## 2017-09-21 DIAGNOSIS — M8580 Other specified disorders of bone density and structure, unspecified site: Secondary | ICD-10-CM | POA: Diagnosis not present

## 2017-09-21 DIAGNOSIS — Z886 Allergy status to analgesic agent status: Secondary | ICD-10-CM | POA: Insufficient documentation

## 2017-09-21 DIAGNOSIS — K219 Gastro-esophageal reflux disease without esophagitis: Secondary | ICD-10-CM | POA: Insufficient documentation

## 2017-09-21 DIAGNOSIS — Z96652 Presence of left artificial knee joint: Secondary | ICD-10-CM

## 2017-09-21 DIAGNOSIS — R3129 Other microscopic hematuria: Secondary | ICD-10-CM | POA: Diagnosis not present

## 2017-09-21 DIAGNOSIS — Z833 Family history of diabetes mellitus: Secondary | ICD-10-CM | POA: Insufficient documentation

## 2017-09-21 DIAGNOSIS — Z825 Family history of asthma and other chronic lower respiratory diseases: Secondary | ICD-10-CM | POA: Insufficient documentation

## 2017-09-21 DIAGNOSIS — R7303 Prediabetes: Secondary | ICD-10-CM | POA: Diagnosis not present

## 2017-09-21 DIAGNOSIS — Z86010 Personal history of colonic polyps: Secondary | ICD-10-CM | POA: Insufficient documentation

## 2017-09-21 DIAGNOSIS — Z840 Family history of diseases of the skin and subcutaneous tissue: Secondary | ICD-10-CM | POA: Insufficient documentation

## 2017-09-21 DIAGNOSIS — Z87891 Personal history of nicotine dependence: Secondary | ICD-10-CM | POA: Insufficient documentation

## 2017-09-21 DIAGNOSIS — N393 Stress incontinence (female) (male): Secondary | ICD-10-CM | POA: Insufficient documentation

## 2017-09-21 DIAGNOSIS — I6529 Occlusion and stenosis of unspecified carotid artery: Secondary | ICD-10-CM | POA: Insufficient documentation

## 2017-09-21 DIAGNOSIS — N3281 Overactive bladder: Secondary | ICD-10-CM | POA: Diagnosis not present

## 2017-09-21 DIAGNOSIS — Z9842 Cataract extraction status, left eye: Secondary | ICD-10-CM | POA: Insufficient documentation

## 2017-09-21 DIAGNOSIS — R591 Generalized enlarged lymph nodes: Secondary | ICD-10-CM | POA: Diagnosis not present

## 2017-09-21 DIAGNOSIS — K5909 Other constipation: Secondary | ICD-10-CM | POA: Insufficient documentation

## 2017-09-21 DIAGNOSIS — Z96659 Presence of unspecified artificial knee joint: Secondary | ICD-10-CM

## 2017-09-21 DIAGNOSIS — D6851 Activated protein C resistance: Secondary | ICD-10-CM | POA: Insufficient documentation

## 2017-09-21 DIAGNOSIS — M1712 Unilateral primary osteoarthritis, left knee: Principal | ICD-10-CM | POA: Insufficient documentation

## 2017-09-21 DIAGNOSIS — Z90710 Acquired absence of both cervix and uterus: Secondary | ICD-10-CM | POA: Insufficient documentation

## 2017-09-21 DIAGNOSIS — R9439 Abnormal result of other cardiovascular function study: Secondary | ICD-10-CM | POA: Insufficient documentation

## 2017-09-21 DIAGNOSIS — Z8269 Family history of other diseases of the musculoskeletal system and connective tissue: Secondary | ICD-10-CM | POA: Insufficient documentation

## 2017-09-21 DIAGNOSIS — Z9841 Cataract extraction status, right eye: Secondary | ICD-10-CM | POA: Insufficient documentation

## 2017-09-21 DIAGNOSIS — E042 Nontoxic multinodular goiter: Secondary | ICD-10-CM | POA: Diagnosis not present

## 2017-09-21 DIAGNOSIS — Z86718 Personal history of other venous thrombosis and embolism: Secondary | ICD-10-CM | POA: Insufficient documentation

## 2017-09-21 DIAGNOSIS — Z8249 Family history of ischemic heart disease and other diseases of the circulatory system: Secondary | ICD-10-CM | POA: Insufficient documentation

## 2017-09-21 HISTORY — PX: TOTAL KNEE ARTHROPLASTY: SHX125

## 2017-09-21 LAB — GLUCOSE, CAPILLARY: Glucose-Capillary: 86 mg/dL (ref 65–99)

## 2017-09-21 LAB — TYPE AND SCREEN
ABO/RH(D): A POS
ANTIBODY SCREEN: NEGATIVE

## 2017-09-21 SURGERY — ARTHROPLASTY, KNEE, TOTAL
Anesthesia: Spinal | Site: Knee | Laterality: Left

## 2017-09-21 MED ORDER — FENTANYL CITRATE (PF) 100 MCG/2ML IJ SOLN: 25.0000 ug | INTRAMUSCULAR | Status: DC | PRN

## 2017-09-21 MED ORDER — HYDROCODONE-ACETAMINOPHEN 7.5-325 MG PO TABS
2.0000 | ORAL_TABLET | ORAL | Status: DC | PRN
  Administered 2017-09-21 – 2017-09-22 (×3): 2 via ORAL
  Filled 2017-09-21 (×4): qty 2

## 2017-09-21 MED ORDER — METOCLOPRAMIDE HCL 5 MG/ML IJ SOLN
5.0000 mg | Freq: Three times a day (TID) | INTRAMUSCULAR | Status: DC | PRN
  Administered 2017-09-22: 10 mg via INTRAVENOUS
  Filled 2017-09-21: qty 2

## 2017-09-21 MED ORDER — DIPHENHYDRAMINE HCL 12.5 MG/5ML PO ELIX: 12.5000 mg | ORAL_SOLUTION | ORAL | Status: DC | PRN

## 2017-09-21 MED ORDER — FENTANYL CITRATE (PF) 100 MCG/2ML IJ SOLN
INTRAMUSCULAR | Status: AC
  Administered 2017-09-21: 50 ug via INTRAVENOUS
  Filled 2017-09-21: qty 2

## 2017-09-21 MED ORDER — DEXAMETHASONE SODIUM PHOSPHATE 10 MG/ML IJ SOLN
10.0000 mg | Freq: Once | INTRAMUSCULAR | Status: AC
  Administered 2017-09-22: 10 mg via INTRAVENOUS
  Filled 2017-09-21: qty 1

## 2017-09-21 MED ORDER — METHOCARBAMOL 500 MG PO TABS
500.0000 mg | ORAL_TABLET | Freq: Four times a day (QID) | ORAL | Status: DC | PRN
  Administered 2017-09-22: 02:00:00 500 mg via ORAL
  Filled 2017-09-21: qty 1

## 2017-09-21 MED ORDER — RIVAROXABAN 10 MG PO TABS: 10.0000 mg | ORAL_TABLET | Freq: Every day | ORAL | 0 refills | Status: DC

## 2017-09-21 MED ORDER — BUPIVACAINE-EPINEPHRINE (PF) 0.5% -1:200000 IJ SOLN
INTRAMUSCULAR | Status: DC | PRN
  Administered 2017-09-21: 30 mL via PERINEURAL

## 2017-09-21 MED ORDER — POLYETHYLENE GLYCOL 3350 17 G PO PACK
17.0000 g | PACK | Freq: Two times a day (BID) | ORAL | Status: DC
  Administered 2017-09-21 – 2017-09-22 (×2): 17 g via ORAL
  Filled 2017-09-21 (×2): qty 1

## 2017-09-21 MED ORDER — CEFAZOLIN SODIUM-DEXTROSE 2-4 GM/100ML-% IV SOLN
2.0000 g | INTRAVENOUS | Status: AC
  Administered 2017-09-21: 2 g via INTRAVENOUS
  Filled 2017-09-21: qty 100

## 2017-09-21 MED ORDER — CHLORHEXIDINE GLUCONATE 4 % EX LIQD: 60.0000 mL | Freq: Once | CUTANEOUS | Status: DC

## 2017-09-21 MED ORDER — KETOROLAC TROMETHAMINE 30 MG/ML IJ SOLN
INTRAMUSCULAR | Status: AC
  Filled 2017-09-21: qty 1

## 2017-09-21 MED ORDER — BUPIVACAINE-EPINEPHRINE 0.25% -1:200000 IJ SOLN
INTRAMUSCULAR | Status: AC
  Filled 2017-09-21: qty 1

## 2017-09-21 MED ORDER — HYDROMORPHONE HCL 1 MG/ML IJ SOLN
0.5000 mg | INTRAMUSCULAR | Status: DC | PRN
  Administered 2017-09-21 – 2017-09-22 (×2): 1 mg via INTRAVENOUS
  Filled 2017-09-21 (×2): qty 1

## 2017-09-21 MED ORDER — PHENYLEPHRINE HCL 10 MG/ML IJ SOLN
INTRAMUSCULAR | Status: AC
  Filled 2017-09-21: qty 1

## 2017-09-21 MED ORDER — BISACODYL 10 MG RE SUPP: 10.0000 mg | Freq: Every day | RECTAL | Status: DC | PRN

## 2017-09-21 MED ORDER — TRANEXAMIC ACID 1000 MG/10ML IV SOLN
2000.0000 mg | INTRAVENOUS | Status: AC
  Filled 2017-09-21: qty 20

## 2017-09-21 MED ORDER — OXYCODONE HCL 5 MG/5ML PO SOLN
5.0000 mg | Freq: Once | ORAL | Status: DC | PRN
  Filled 2017-09-21: qty 5

## 2017-09-21 MED ORDER — METHOCARBAMOL 1000 MG/10ML IJ SOLN
500.0000 mg | Freq: Four times a day (QID) | INTRAVENOUS | Status: DC | PRN
  Filled 2017-09-21: qty 5

## 2017-09-21 MED ORDER — ALUM & MAG HYDROXIDE-SIMETH 200-200-20 MG/5ML PO SUSP: 15.0000 mL | ORAL | Status: DC | PRN

## 2017-09-21 MED ORDER — SODIUM CHLORIDE 0.9 % IV SOLN
INTRAVENOUS | Status: DC
  Administered 2017-09-21: 18:00:00 via INTRAVENOUS

## 2017-09-21 MED ORDER — SIMVASTATIN 20 MG PO TABS
40.0000 mg | ORAL_TABLET | Freq: Every day | ORAL | Status: DC
  Administered 2017-09-21: 21:00:00 40 mg via ORAL
  Filled 2017-09-21: qty 2

## 2017-09-21 MED ORDER — ONDANSETRON HCL 4 MG/2ML IJ SOLN: 4.0000 mg | Freq: Once | INTRAMUSCULAR | Status: DC | PRN

## 2017-09-21 MED ORDER — DOCUSATE SODIUM 100 MG PO CAPS
100.0000 mg | ORAL_CAPSULE | Freq: Two times a day (BID) | ORAL | Status: DC
  Administered 2017-09-21 – 2017-09-22 (×2): 100 mg via ORAL
  Filled 2017-09-21 (×2): qty 1

## 2017-09-21 MED ORDER — ESOMEPRAZOLE MAGNESIUM 20 MG PO CPDR
20.0000 mg | DELAYED_RELEASE_CAPSULE | Freq: Every day | ORAL | Status: DC | PRN
  Filled 2017-09-21: qty 1

## 2017-09-21 MED ORDER — DOCUSATE SODIUM 100 MG PO CAPS: 100.0000 mg | ORAL_CAPSULE | Freq: Two times a day (BID) | ORAL | 0 refills | Status: DC

## 2017-09-21 MED ORDER — ACETAMINOPHEN 325 MG PO TABS: 650.0000 mg | ORAL_TABLET | ORAL | Status: DC | PRN

## 2017-09-21 MED ORDER — NITROFURANTOIN MACROCRYSTAL 100 MG PO CAPS
100.0000 mg | ORAL_CAPSULE | Freq: Every day | ORAL | Status: DC
  Administered 2017-09-22: 08:00:00 100 mg via ORAL
  Filled 2017-09-21: qty 1

## 2017-09-21 MED ORDER — BUPIVACAINE-EPINEPHRINE (PF) 0.25% -1:200000 IJ SOLN
INTRAMUSCULAR | Status: DC | PRN
  Administered 2017-09-21: 30 mL

## 2017-09-21 MED ORDER — CEFAZOLIN SODIUM-DEXTROSE 2-4 GM/100ML-% IV SOLN
2.0000 g | Freq: Four times a day (QID) | INTRAVENOUS | Status: AC
  Administered 2017-09-21 – 2017-09-22 (×2): 2 g via INTRAVENOUS
  Filled 2017-09-21 (×2): qty 100

## 2017-09-21 MED ORDER — HYDROCODONE-ACETAMINOPHEN 7.5-325 MG PO TABS
1.0000 | ORAL_TABLET | ORAL | Status: DC | PRN
  Administered 2017-09-21: 1 via ORAL
  Filled 2017-09-21: qty 1

## 2017-09-21 MED ORDER — MIDAZOLAM HCL 2 MG/2ML IJ SOLN
1.0000 mg | INTRAMUSCULAR | Status: DC | PRN
  Administered 2017-09-21: 1 mg via INTRAVENOUS

## 2017-09-21 MED ORDER — PROPOFOL 10 MG/ML IV BOLUS
INTRAVENOUS | Status: DC | PRN
  Administered 2017-09-21: 10 mg via INTRAVENOUS
  Administered 2017-09-21: 50 mg via INTRAVENOUS

## 2017-09-21 MED ORDER — DEXAMETHASONE SODIUM PHOSPHATE 10 MG/ML IJ SOLN
INTRAMUSCULAR | Status: AC
  Filled 2017-09-21: qty 1

## 2017-09-21 MED ORDER — BUPIVACAINE IN DEXTROSE 0.75-8.25 % IT SOLN
INTRATHECAL | Status: DC | PRN
  Administered 2017-09-21: 1.8 mL via INTRATHECAL

## 2017-09-21 MED ORDER — SODIUM CHLORIDE 0.9 % IJ SOLN
INTRAMUSCULAR | Status: AC
  Filled 2017-09-21: qty 50

## 2017-09-21 MED ORDER — SODIUM CHLORIDE 0.9 % IJ SOLN
INTRAMUSCULAR | Status: DC | PRN
  Administered 2017-09-21: 30 mL

## 2017-09-21 MED ORDER — LACTATED RINGERS IV SOLN
INTRAVENOUS | Status: DC
  Administered 2017-09-21 (×2): via INTRAVENOUS
  Administered 2017-09-21: 1000 mL via INTRAVENOUS

## 2017-09-21 MED ORDER — POLYETHYLENE GLYCOL 3350 17 G PO PACK: 17.0000 g | PACK | Freq: Two times a day (BID) | ORAL | 0 refills | Status: DC

## 2017-09-21 MED ORDER — FERROUS SULFATE 325 (65 FE) MG PO TABS
325.0000 mg | ORAL_TABLET | Freq: Three times a day (TID) | ORAL | Status: DC
  Administered 2017-09-22: 08:00:00 325 mg via ORAL
  Filled 2017-09-21 (×2): qty 1

## 2017-09-21 MED ORDER — PROPOFOL 500 MG/50ML IV EMUL
INTRAVENOUS | Status: DC | PRN
  Administered 2017-09-21: 100 ug/kg/min via INTRAVENOUS

## 2017-09-21 MED ORDER — MENTHOL 3 MG MT LOZG: 1.0000 | LOZENGE | OROMUCOSAL | Status: DC | PRN

## 2017-09-21 MED ORDER — RIVAROXABAN 10 MG PO TABS
10.0000 mg | ORAL_TABLET | ORAL | Status: DC
  Administered 2017-09-22: 08:00:00 10 mg via ORAL
  Filled 2017-09-21: qty 1

## 2017-09-21 MED ORDER — MIDAZOLAM HCL 2 MG/2ML IJ SOLN
INTRAMUSCULAR | Status: AC
  Administered 2017-09-21: 1 mg via INTRAVENOUS
  Filled 2017-09-21: qty 2

## 2017-09-21 MED ORDER — MAGNESIUM CITRATE PO SOLN: 1.0000 | Freq: Once | ORAL | Status: DC | PRN

## 2017-09-21 MED ORDER — FENTANYL CITRATE (PF) 100 MCG/2ML IJ SOLN
50.0000 ug | INTRAMUSCULAR | Status: DC | PRN
  Administered 2017-09-21: 50 ug via INTRAVENOUS

## 2017-09-21 MED ORDER — FERROUS SULFATE 325 (65 FE) MG PO TABS: 325.0000 mg | ORAL_TABLET | Freq: Three times a day (TID) | ORAL | 3 refills | Status: DC

## 2017-09-21 MED ORDER — METHOCARBAMOL 500 MG PO TABS: 500.0000 mg | ORAL_TABLET | Freq: Four times a day (QID) | ORAL | 0 refills | Status: DC | PRN

## 2017-09-21 MED ORDER — TRANEXAMIC ACID 1000 MG/10ML IV SOLN
INTRAVENOUS | Status: AC | PRN
  Administered 2017-09-21: 2000 mg via TOPICAL

## 2017-09-21 MED ORDER — DEXTROSE 5 % IV SOLN
INTRAVENOUS | Status: DC | PRN
  Administered 2017-09-21: 50 ug/min via INTRAVENOUS

## 2017-09-21 MED ORDER — ONDANSETRON HCL 4 MG/2ML IJ SOLN
INTRAMUSCULAR | Status: DC | PRN
  Administered 2017-09-21: 4 mg via INTRAVENOUS

## 2017-09-21 MED ORDER — BUPIVACAINE HCL (PF) 0.25 % IJ SOLN
INTRAMUSCULAR | Status: AC
  Filled 2017-09-21: qty 30

## 2017-09-21 MED ORDER — SODIUM CHLORIDE 0.9 % IR SOLN
Status: DC | PRN
  Administered 2017-09-21: 1000 mL

## 2017-09-21 MED ORDER — ONDANSETRON HCL 4 MG/2ML IJ SOLN
INTRAMUSCULAR | Status: AC
  Filled 2017-09-21: qty 2

## 2017-09-21 MED ORDER — PHENYLEPHRINE 40 MCG/ML (10ML) SYRINGE FOR IV PUSH (FOR BLOOD PRESSURE SUPPORT)
PREFILLED_SYRINGE | INTRAVENOUS | Status: DC | PRN
  Administered 2017-09-21: 80 ug via INTRAVENOUS

## 2017-09-21 MED ORDER — ONDANSETRON HCL 4 MG PO TABS
4.0000 mg | ORAL_TABLET | Freq: Three times a day (TID) | ORAL | Status: DC | PRN
Start: 2017-09-21 — End: 2017-09-22

## 2017-09-21 MED ORDER — ACETAMINOPHEN 650 MG RE SUPP
650.0000 mg | RECTAL | Status: DC | PRN
Start: 2017-09-21 — End: 2017-09-22

## 2017-09-21 MED ORDER — METOCLOPRAMIDE HCL 5 MG PO TABS: 5.0000 mg | ORAL_TABLET | Freq: Three times a day (TID) | ORAL | Status: DC | PRN

## 2017-09-21 MED ORDER — DEXAMETHASONE SODIUM PHOSPHATE 10 MG/ML IJ SOLN
10.0000 mg | Freq: Once | INTRAMUSCULAR | Status: AC
Start: 2017-09-21 — End: 2017-09-21
  Administered 2017-09-21: 10 mg via INTRAVENOUS

## 2017-09-21 MED ORDER — HYDROCODONE-ACETAMINOPHEN 7.5-325 MG PO TABS: 1.0000 | ORAL_TABLET | ORAL | 0 refills | Status: DC | PRN

## 2017-09-21 MED ORDER — OXYCODONE HCL 5 MG PO TABS: 5.0000 mg | ORAL_TABLET | Freq: Once | ORAL | Status: DC | PRN

## 2017-09-21 MED ORDER — CELECOXIB 200 MG PO CAPS
200.0000 mg | ORAL_CAPSULE | Freq: Two times a day (BID) | ORAL | Status: DC
  Administered 2017-09-21 – 2017-09-22 (×2): 200 mg via ORAL
  Filled 2017-09-21 (×2): qty 1

## 2017-09-21 MED ORDER — PROPOFOL 10 MG/ML IV BOLUS
INTRAVENOUS | Status: AC
  Filled 2017-09-21: qty 60

## 2017-09-21 MED ORDER — PHENOL 1.4 % MT LIQD: 1.0000 | OROMUCOSAL | Status: DC | PRN

## 2017-09-21 MED ORDER — ONDANSETRON HCL 4 MG/2ML IJ SOLN
4.0000 mg | Freq: Three times a day (TID) | INTRAMUSCULAR | Status: DC | PRN
  Administered 2017-09-21: 20:00:00 4 mg via INTRAVENOUS
  Filled 2017-09-21: qty 2

## 2017-09-21 MED ORDER — KETOROLAC TROMETHAMINE 30 MG/ML IJ SOLN
INTRAMUSCULAR | Status: DC | PRN
  Administered 2017-09-21: 30 mg

## 2017-09-21 SURGICAL SUPPLY — 48 items
ADH SKN CLS APL DERMABOND .7 (GAUZE/BANDAGES/DRESSINGS) ×1
BAG DECANTER FOR FLEXI CONT (MISCELLANEOUS) IMPLANT
BAG SPEC THK2 15X12 ZIP CLS (MISCELLANEOUS)
BAG ZIPLOCK 12X15 (MISCELLANEOUS) IMPLANT
BANDAGE ACE 6X5 VEL STRL LF (GAUZE/BANDAGES/DRESSINGS) ×3 IMPLANT
BLADE SAW SGTL 11.0X1.19X90.0M (BLADE) IMPLANT
BLADE SAW SGTL 13.0X1.19X90.0M (BLADE) ×3 IMPLANT
BOWL SMART MIX CTS (DISPOSABLE) ×3 IMPLANT
CAPT KNEE TOTAL 3 ATTUNE ×2 IMPLANT
CEMENT HV SMART SET (Cement) ×4 IMPLANT
COVER SURGICAL LIGHT HANDLE (MISCELLANEOUS) ×3 IMPLANT
CUFF TOURN SGL QUICK 34 (TOURNIQUET CUFF) ×3
CUFF TRNQT CYL 34X4X40X1 (TOURNIQUET CUFF) ×1 IMPLANT
DECANTER SPIKE VIAL GLASS SM (MISCELLANEOUS) ×3 IMPLANT
DERMABOND ADVANCED (GAUZE/BANDAGES/DRESSINGS) ×2
DERMABOND ADVANCED .7 DNX12 (GAUZE/BANDAGES/DRESSINGS) ×1 IMPLANT
DRAPE U-SHAPE 47X51 STRL (DRAPES) ×3 IMPLANT
DRESSING AQUACEL AG SP 3.5X10 (GAUZE/BANDAGES/DRESSINGS) ×1 IMPLANT
DRSG AQUACEL AG SP 3.5X10 (GAUZE/BANDAGES/DRESSINGS) ×3
DURAPREP 26ML APPLICATOR (WOUND CARE) ×6 IMPLANT
ELECT REM PT RETURN 15FT ADLT (MISCELLANEOUS) ×3 IMPLANT
GLOVE BIOGEL M 7.0 STRL (GLOVE) IMPLANT
GLOVE BIOGEL PI IND STRL 7.5 (GLOVE) ×1 IMPLANT
GLOVE BIOGEL PI IND STRL 8.5 (GLOVE) ×1 IMPLANT
GLOVE BIOGEL PI INDICATOR 7.5 (GLOVE) ×2
GLOVE BIOGEL PI INDICATOR 8.5 (GLOVE) ×2
GLOVE ECLIPSE 8.0 STRL XLNG CF (GLOVE) ×3 IMPLANT
GLOVE ORTHO TXT STRL SZ7.5 (GLOVE) ×6 IMPLANT
GOWN STRL REUS W/TWL LRG LVL3 (GOWN DISPOSABLE) ×3 IMPLANT
GOWN STRL REUS W/TWL XL LVL3 (GOWN DISPOSABLE) ×3 IMPLANT
HANDPIECE INTERPULSE COAX TIP (DISPOSABLE) ×3
MANIFOLD NEPTUNE II (INSTRUMENTS) ×3 IMPLANT
PACK TOTAL KNEE CUSTOM (KITS) ×3 IMPLANT
POSITIONER SURGICAL ARM (MISCELLANEOUS) ×3 IMPLANT
SET HNDPC FAN SPRY TIP SCT (DISPOSABLE) ×1 IMPLANT
SET PAD KNEE POSITIONER (MISCELLANEOUS) ×3 IMPLANT
SUT MNCRL AB 4-0 PS2 18 (SUTURE) ×3 IMPLANT
SUT STRATAFIX 1PDS 45CM VIOLET (SUTURE) ×2 IMPLANT
SUT STRATAFIX SPIRAL PDS+ 70CM (SUTURE)
SUT VIC AB 1 CT1 36 (SUTURE) ×3 IMPLANT
SUT VIC AB 2-0 CT1 27 (SUTURE) ×9
SUT VIC AB 2-0 CT1 TAPERPNT 27 (SUTURE) ×3 IMPLANT
SUTURE STRATFX SPIRL PDS+ 70CM (SUTURE) IMPLANT
SYR 50ML LL SCALE MARK (SYRINGE) ×3 IMPLANT
TRAY FOLEY W/METER SILVER 16FR (SET/KITS/TRAYS/PACK) ×3 IMPLANT
WATER STERILE IRR 1000ML POUR (IV SOLUTION) ×3 IMPLANT
WRAP KNEE MAXI GEL POST OP (GAUZE/BANDAGES/DRESSINGS) ×3 IMPLANT
YANKAUER SUCT BULB TIP 10FT TU (MISCELLANEOUS) ×3 IMPLANT

## 2017-09-21 NOTE — Anesthesia Postprocedure Evaluation (Signed)
Anesthesia Post Note  Patient: Dorothy Rojas  Procedure(s) Performed: LEFT TOTAL KNEE ARTHROPLASTY (Left Knee)     Patient location during evaluation: PACU Anesthesia Type: Spinal Level of consciousness: awake and alert Pain management: pain level controlled Vital Signs Assessment: post-procedure vital signs reviewed and stable Respiratory status: spontaneous breathing and respiratory function stable Cardiovascular status: blood pressure returned to baseline and stable Postop Assessment: spinal receding and no apparent nausea or vomiting Anesthetic complications: no    Last Vitals:  Vitals:   09/21/17 1530 09/21/17 1545  BP: 132/64 133/64  Pulse: 67 65  Resp: 14 15  Temp:  36.5 C  SpO2: 100% 100%    Last Pain:  Vitals:   09/21/17 1021  TempSrc: Oral                 Audry Pili

## 2017-09-21 NOTE — Transfer of Care (Addendum)
Immediate Anesthesia Transfer of Care Note  Patient: Dorothy Rojas  Procedure(s) Performed: LEFT TOTAL KNEE ARTHROPLASTY (Left Knee)  Patient Location: PACU  Anesthesia Type:Spinal  Level of Consciousness: awake, alert  and oriented  Airway & Oxygen Therapy: Patient Spontanous Breathing and Patient connected to face mask oxygen  Post-op Assessment: Report given to RN and Post -op Vital signs reviewed and stable  Post vital signs: Reviewed and stable  Last Vitals:  Vitals:   09/21/17 1205 09/21/17 1210  BP: (!) 125/50 (!) 125/52  Pulse: 81 82  Resp: 16 18  Temp:    SpO2: 100% 98%    Last Pain:  Vitals:   09/21/17 1021  TempSrc: Oral      Patients Stated Pain Goal: 0 (75/[id number]/32 [id number])  Complications: No apparent anesthesia complications

## 2017-09-21 NOTE — Op Note (Signed)
NAME:  Dorothy Rojas                      MEDICAL RECORD NO.:  [id number]                             FACILITY:  [facility]      PHYSICIAN:  Pietro Cassis. Alvan Dame, M.D.  DATE OF BIRTH:  05/12/1946      DATE OF PROCEDURE:  09/21/2017                                     OPERATIVE REPORT         PREOPERATIVE DIAGNOSIS:  Left knee osteoarthritis.      POSTOPERATIVE DIAGNOSIS:  Left knee osteoarthritis.      FINDINGS:  The patient was noted to have complete loss of cartilage and   bone-on-bone arthritis with associated osteophytes in the medial and patellofemoral compartments of   the knee with a significant synovitis and associated effusion.      PROCEDURE:  Left total knee replacement.      COMPONENTS USED:  DePuy Attune rotating platform posterior stabilized knee   system, a size 5n femur, 4 tibia, size 6 mm PS AOX insert, and 38 anatomic patellar   button.      SURGEON:  Pietro Cassis. Alvan Dame, M.D.      ASSISTANT:  Danae Orleans, PA-C.      ANESTHESIA:  Regional and Spinal.      SPECIMENS:  None.      COMPLICATION:  None.      DRAINS:  none.  EBL: <100cc      TOURNIQUET TIME:   Total Tourniquet Time Documented: Thigh (Left) - 23 minutes Total: Thigh (Left) - 23 minutes  .      The patient was stable to the recovery room.      INDICATION FOR PROCEDURE:  Dorothy Rojas is a 72 y.o. female patient of   mine.  The patient had been seen, evaluated, and treated conservatively in the   office with medication, activity modification, and injections.  The patient had   radiographic changes of bone-on-bone arthritis with endplate sclerosis and osteophytes noted.      The patient failed conservative measures including medication, injections, and activity modification, and at this point was ready for more definitive measures.   Based on the radiographic changes and failed conservative measures, the patient   decided to proceed with total knee replacement.  Risks of infection,   DVT,  component failure, need for revision surgery, postop course, and   expectations were all   discussed and reviewed.  Consent was obtained for benefit of pain   relief.      PROCEDURE IN DETAIL:  The patient was brought to the operative theater.   Once adequate anesthesia, preoperative antibiotics, 2 gm of Ancef and 10 gm of Decadron (TXA topical admin) administered, the patient was positioned supine with the left thigh tourniquet placed.  The  left lower extremity was prepped and draped in sterile fashion.  A time-   out was performed identifying the patient, planned procedure, and   extremity.      The left lower extremity was placed in the [facility] leg holder.  The leg was   exsanguinated, tourniquet elevated to 250 mmHg.  A midline incision was   made  followed by median parapatellar arthrotomy.  Following initial   exposure, attention was first directed to the patella.  Precut   measurement was noted to be 23 mm.  I resected down to 14 mm and used a   38 anatomic patellar button to restore patellar height as well as cover the cut   surface.      The lug holes were drilled and a metal shim was placed to protect the   patella from retractors and saw blades.      At this point, attention was now directed to the femur.  The femoral   canal was opened with a drill, irrigated to try to prevent fat emboli.  An   intramedullary rod was passed at 3 degrees valgus, 8 mm of bone was   resected off the distal femur.  Following this resection, the tibia was   subluxated anteriorly.  Using the extramedullary guide, 2 mm of bone was resected off   the proximal medial tibia.  We confirmed the gap would be   stable medially and laterally with a size 5 spacer block as well as confirmed   the cut was perpendicular in the coronal plane, checking with an alignment rod.      Once this was done, I sized the femur to be a size 5 in the anterior-   posterior dimension, chose a narrow component based on medial  and   lateral dimension.  The size 5 rotation block was then pinned in   position anterior referenced using the C-clamp to set rotation.  The   anterior, posterior, and  chamfer cuts were made without difficulty nor   notching making certain that I was along the anterior cortex to help   with flexion gap stability.      The final box cut was made off the lateral aspect of distal femur.      At this point, the tibia was sized to be a size 4, the size 4 tray was   then pinned in position through the medial third of the tubercle,   drilled, and keel punched.  Trial reduction was now carried with a 5 femur,  4 tibia, a size 5 then 6 mm PS insert, and the 38 anatomic patella botton.  The knee was brought to   extension, full extension with good flexion stability with the patella   tracking through the trochlea without application of pressure.  Given   all these findings the femoral lug holes were drilled and then the trial components removed.  Final components were   opened and cement was mixed.  The knee was irrigated with normal saline   solution and pulse lavage.  The synovial lining was   then injected with 30 cc of 0.25% Marcaine with epinephrine and 1 cc of Toradol plus 30 cc of NS for a total of 61 cc.      The knee was irrigated.  Final implants were then cemented onto clean and   dried cut surfaces of bone with the knee brought to extension with a size 6 mm PS trial insert.      Once the cement had fully cured, the excess cement was removed   throughout the knee.  I confirmed I was satisfied with the range of   motion and stability, and the final size 6 mm PS AOX insert was chosen.  It was   placed into the knee.      The tourniquet had been let down  at 23 minutes.  No significant   hemostasis required.  The   extensor mechanism was then reapproximated using #1 Vicryl and #1 Stratafix sutures with the knee   in flexion.  The   remaining wound was closed with 2-0 Vicryl and running  4-0 Monocryl.   The knee was cleaned, dried, dressed sterilely using Dermabond and   Aquacel dressing.  The patient was then   brought to recovery room in stable condition, tolerating the procedure   well.   Please note that Physician Assistant, Danae Orleans, PA-C, was present for the entirety of the case, and was utilized for pre-operative positioning, peri-operative retractor management, general facilitation of the procedure.  He was also utilized for primary wound closure at the end of the case.              Pietro Cassis Alvan Dame, M.D.    09/21/2017 2:21 PM

## 2017-09-21 NOTE — Anesthesia Preprocedure Evaluation (Signed)
Anesthesia Evaluation  Patient identified by MRN, date of birth, ID band Patient awake    Reviewed: Allergy & Precautions, NPO status , Patient's Chart, lab work & pertinent test results  Airway Mallampati: II  TM Distance: >3 FB Neck ROM: Full    Dental no notable dental hx. (+) Dental Advisory Given, Teeth Intact   Pulmonary neg pulmonary ROS, former smoker,    Pulmonary exam normal breath sounds clear to auscultation       Cardiovascular hypertension, Pt. on medications + DVT  Normal cardiovascular exam+ Valvular Problems/Murmurs  Rhythm:Regular Rate:Normal  History of DVT of lower extremity  dx 07-28-2013 left lower extrem. -- xarelto for 6 months     Neuro/Psych Carotid stenosis negative psych ROS   GI/Hepatic Neg liver ROS, GERD  Medicated,  Endo/Other  negative endocrine ROS  Renal/GU negative Renal ROS  negative genitourinary   Musculoskeletal negative musculoskeletal ROS (+)   Abdominal   Peds negative pediatric ROS (+)  Hematology  (+) anemia , Heterozygous factor V Leiden mutation   Anesthesia Other Findings   Reproductive/Obstetrics negative OB ROS                             Anesthesia Physical  Anesthesia Plan  ASA: III  Anesthesia Plan: Spinal   Post-op Pain Management:  Regional for Post-op pain   Induction:   PONV Risk Score and Plan: 2 and Treatment may vary due to age or medical condition and Propofol infusion  Airway Management Planned: Natural Airway and Nasal Cannula  Additional Equipment: None  Intra-op Plan:   Post-operative Plan:   Informed Consent: I have reviewed the patients History and Physical, chart, labs and discussed the procedure including the risks, benefits and alternatives for the proposed anesthesia with the patient or authorized representative who has indicated his/her understanding and acceptance.   Dental advisory given  Plan  Discussed with: CRNA  Anesthesia Plan Comments:        Anesthesia Quick Evaluation

## 2017-09-21 NOTE — Interval H&P Note (Signed)
History and Physical Interval Note:  09/21/2017 11:55 AM  Dorothy Rojas  has presented today for surgery, with the diagnosis of Left knee osteoarthritis  The various methods of treatment have been discussed with the patient and family. After consideration of risks, benefits and other options for treatment, the patient has consented to  Procedure(s) with comments: LEFT TOTAL KNEE ARTHROPLASTY (Left) - 90 mins as a surgical intervention .  The patient's history has been reviewed, patient examined, no change in status, stable for surgery.  I have reviewed the patient's chart and labs.  Questions were answered to the patient's satisfaction.     Mauri Pole

## 2017-09-21 NOTE — Anesthesia Procedure Notes (Signed)
Spinal  Patient location during procedure: OR Start time: 09/21/2017 1:08 PM Staffing Anesthesiologist: Audry Pili, MD Resident/CRNA: [place] Mana Morison C, CRNA Performed: resident/CRNA  Preanesthetic Checklist Completed: patient identified, site marked, surgical consent, pre-op evaluation, timeout performed, IV checked, risks and benefits discussed and monitors and equipment checked Spinal Block Patient position: sitting Prep: Betadine Patient monitoring: heart rate, continuous pulse ox and blood pressure Approach: midline Location: L3-4 Injection technique: single-shot Needle Needle type: Pencan  Needle gauge: 24 G Needle length: 9 cm Assessment Sensory level: T6 Additional Notes Expiration date of kit checked and confirmed. Patient tolerated procedure well, without complications.

## 2017-09-21 NOTE — Discharge Instructions (Addendum)

## 2017-09-21 NOTE — Anesthesia Procedure Notes (Signed)
Anesthesia Regional Block: Adductor canal block   Pre-Anesthetic Checklist: ,, timeout performed, Correct Patient, Correct Site, Correct Laterality, Correct Procedure, Correct Position, site marked, Risks and benefits discussed,  Surgical consent,  Pre-op evaluation,  At surgeon's request and post-op pain management  Laterality: Left  Prep: chloraprep       Needles:  Injection technique: Single-shot  Needle Type: Echogenic Needle     Needle Length: 9cm  Needle Gauge: 21     Additional Needles:   Narrative:  Start time: 09/21/2017 11:48 AM End time: 09/21/2017 11:51 AM Injection made incrementally with aspirations every 5 mL.  Performed by: Personally  Anesthesiologist: Audry Pili, MD  Additional Notes: No pain on injection. No increased resistance to injection. Injection made in 5cc increments. Good needle visualization. Patient tolerated the procedure well.

## 2017-09-21 NOTE — Progress Notes (Signed)
AssistedDr. Brock with left, ultrasound guided, adductor canal block. Side rails up, monitors on throughout procedure. See vital signs in flow sheet. Tolerated Procedure well.  

## 2017-09-22 DIAGNOSIS — K219 Gastro-esophageal reflux disease without esophagitis: Secondary | ICD-10-CM | POA: Diagnosis not present

## 2017-09-22 DIAGNOSIS — D649 Anemia, unspecified: Secondary | ICD-10-CM | POA: Diagnosis not present

## 2017-09-22 DIAGNOSIS — M1712 Unilateral primary osteoarthritis, left knee: Secondary | ICD-10-CM | POA: Diagnosis not present

## 2017-09-22 DIAGNOSIS — K5790 Diverticulosis of intestine, part unspecified, without perforation or abscess without bleeding: Secondary | ICD-10-CM | POA: Diagnosis not present

## 2017-09-22 DIAGNOSIS — Z86010 Personal history of colonic polyps: Secondary | ICD-10-CM | POA: Diagnosis not present

## 2017-09-22 DIAGNOSIS — K5909 Other constipation: Secondary | ICD-10-CM | POA: Diagnosis not present

## 2017-09-22 LAB — BASIC METABOLIC PANEL
Anion gap: 12 (ref 5–15)
BUN: 17 mg/dL (ref 6–20)
CHLORIDE: 103 mmol/L (ref 101–111)
CO2: 24 mmol/L (ref 22–32)
CREATININE: 0.76 mg/dL (ref 0.44–1.00)
Calcium: 8.2 mg/dL — ABNORMAL LOW (ref 8.9–10.3)
GFR calc Af Amer: >60 mL/min (ref >60)
GFR calc non Af Amer: >60 mL/min (ref >60)
Glucose, Bld: 206 mg/dL — ABNORMAL HIGH (ref 65–99)
POTASSIUM: 3.9 mmol/L (ref 3.5–5.1)
SODIUM: 139 mmol/L (ref 135–145)

## 2017-09-22 LAB — CBC
HEMATOCRIT: 31 % — AB (ref 36.0–46.0)
HEMOGLOBIN: 10.9 g/dL — AB (ref 12.0–15.0)
MCH: 35 pg — ABNORMAL HIGH (ref 26.0–34.0)
MCHC: 35.2 g/dL (ref 30.0–36.0)
MCV: 99.7 fL (ref 78.0–100.0)
Platelets: 179 10*3/uL (ref 150–400)
RBC: 3.11 MIL/uL — ABNORMAL LOW (ref 3.87–5.11)
RDW: 13 % (ref 11.5–15.5)
WBC: 15.4 10*3/uL — ABNORMAL HIGH (ref 4.0–10.5)

## 2017-09-22 NOTE — Discharge Summary (Signed)
Physician Discharge Summary  Patient ID: Dorothy Rojas MRN: [id number] DOB/AGE: 1946/01/15 72 y.o.  Admit date: 09/21/2017 Discharge date:  09/22/2017  Procedures:  Procedure(s) (LRB): LEFT TOTAL KNEE ARTHROPLASTY (Left)  Attending Physician:  Dr. Paralee Cancel   Admission Diagnoses:   Left knee primary OA / pain  Discharge Diagnoses:  Principal Problem:   S/P left TKA Active Problems:   S/P total knee replacement  Past Medical History:  Diagnosis Date  . Anemia   . Arthritis    knees  . Chronic constipation   . Colon polyp 06/2015   tubular adenoma (sigmoid)  . Diverticulosis    seen on colonoscopy 06/2015  . GERD (gastroesophageal reflux disease)    normal EGD 06/2015  . Heterozygous factor V Leiden mutation [facility]) dx 07/2013     hematologist--  dr Bernadene Bell ([facility])--  per note "Low Risk"  . History of DVT of lower extremity    dx 07-28-2013  left lower extrem.  --  xarelto for 6 months  . Hypertension   . Lymphadenopathy, inguinal    bilateral  . Microscopic hematuria    chronic   . Multinodular goiter   . Multinodular thyroid    a. Path benign 2013.  [name] OAB (overactive bladder)   . Osteopenia    last DEXA 10/2009  . Pre-diabetes    no meds, managaed with lifestyle changes   . Prediabetes   . SUI (stress urinary incontinence, female)   . Tricuspid regurgitation    mild-mod, noted on echo 03/2016    HPI:    Dorothy Rojas, 72 y.o. female, has a history of pain and functional disability in the left knee due to arthritis and has failed non-surgical conservative treatments for greater than 12 weeks to include NSAID's and/or analgesics, corticosteriod injections, viscosupplementation injections and activity modification.  Onset of symptoms was gradual, starting >10 years ago with gradually worsening course since that time. The patient noted no past surgery on the left knee(s).  Patient currently rates pain in the left knee(s) at 10 out of 10  with activity. Patient has night pain, worsening of pain with activity and weight bearing, pain that interferes with activities of daily living, pain with passive range of motion, crepitus and joint swelling.  Patient has evidence of periarticular osteophytes and joint space narrowing by imaging studies.  There is no active infection.   Risks, benefits and expectations were discussed with the patient.  Risks including but not limited to the risk of anesthesia, blood clots, nerve damage, blood vessel damage, failure of the prosthesis, infection and up to and including death.  Patient understand the risks, benefits and expectations and wishes to proceed with surgery.   PCP: [name], MD   Discharged Condition: good  Hospital Course:  Patient underwent the above stated procedure on 09/21/2017. Patient tolerated the procedure well and brought to the recovery room in good condition and subsequently to the floor.  POD #1 BP: 118/53 ; Pulse: 71 ; Temp: 98.3 F (36.8 C) ; Resp: 16 Patient reports pain as mild to moderate, no events.  Feeling pretty good about everything this am. Neurovascular intact and incision: dressing C/D/I.   LABS  Basename    HGB     10.9  HCT     31.0    Discharge Exam: General appearance: alert, cooperative and no distress Extremities: Homans sign is negative, no sign of DVT, no edema, redness or tenderness in the calves or thighs  and no ulcers, gangrene or trophic changes  Disposition: Home with follow up in 2 weeks   Follow-up Information    Paralee Cancel, MD. Schedule an appointment as soon as possible for a visit in 2 week(s).   Specialty:  Orthopedic Surgery Contact information: [address] [place] [address] [phone]           Discharge Instructions    Call MD / Call 911   Complete by:  As directed    If you experience chest pain or shortness of breath, CALL 911 and be transported to the hospital emergency room.  If you  develope a fever above 101 F, pus (white drainage) or increased drainage or redness at the wound, or calf pain, call your surgeon's office.   Change dressing   Complete by:  As directed    Maintain surgical dressing until follow up in the clinic. If the edges start to pull up, may reinforce with tape. If the dressing is no longer working, may remove and cover with gauze and tape, but must keep the area dry and clean.  Call with any questions or concerns.   Constipation Prevention   Complete by:  As directed    Drink plenty of fluids.  Prune juice may be helpful.  You may use a stool softener, such as Colace (over the counter) 100 mg twice a day.  Use MiraLax (over the counter) for constipation as needed.   Diet - low sodium heart healthy   Complete by:  As directed    Discharge instructions   Complete by:  As directed    Maintain surgical dressing until follow up in the clinic. If the edges start to pull up, may reinforce with tape. If the dressing is no longer working, may remove and cover with gauze and tape, but must keep the area dry and clean.  Follow up in 2 weeks at [facility]. Call with any questions or concerns.   Increase activity slowly as tolerated   Complete by:  As directed    Weight bearing as tolerated with assist device (walker, cane, etc) as directed, use it as long as suggested by your surgeon or therapist, typically at least 4-6 weeks.   TED hose   Complete by:  As directed    Use stockings (TED hose) for 2 weeks on both leg(s).  You may remove them at night for sleeping.      Allergies as of 09/22/2017      Reactions   Naprosyn [naproxen] Rash      Medication List    STOP taking these medications   acetaminophen 500 MG tablet Commonly known as:  TYLENOL   aspirin EC 81 MG tablet   ciprofloxacin 250 MG tablet Commonly known as:  CIPRO   conjugated estrogens vaginal cream Commonly known as:  PREMARIN   ibandronate 150 MG tablet Commonly known  as:  BONIVA   ibuprofen 200 MG tablet Commonly known as:  ADVIL,MOTRIN     TAKE these medications   CALCIUM + D PO Take 1 tablet by mouth daily.   docusate sodium 100 MG capsule Commonly known as:  COLACE Take 1 capsule (100 mg total) by mouth 2 (two) times daily.   esomeprazole 20 MG capsule Commonly known as:  NEXIUM Take 20 mg by mouth daily as needed (acid reflux).   ferrous sulfate 325 (65 FE) MG tablet Commonly known as:  FERROUSUL Take 1 tablet (325 mg total) by mouth 3 (three)  times daily with meals.   HYDROcodone-acetaminophen 7.5-325 MG tablet Commonly known as:  NORCO Take 1-2 tablets by mouth every 4 (four) hours as needed for moderate pain.   lisinopril-hydrochlorothiazide 20-12.5 MG tablet Commonly known as:  PRINZIDE,ZESTORETIC Take 1 tablet daily by mouth.   methocarbamol 500 MG tablet Commonly known as:  ROBAXIN Take 1 tablet (500 mg total) by mouth every 6 (six) hours as needed for muscle spasms.   multivitamin with minerals tablet Take 1 tablet by mouth daily.   nitrofurantoin 100 MG capsule Commonly known as:  MACRODANTIN Take 100 mg by mouth daily.   polyethylene glycol packet Commonly known as:  MIRALAX / GLYCOLAX Take 17 g by mouth 2 (two) times daily.   rivaroxaban 10 MG Tabs tablet Commonly known as:  XARELTO Take 1 tablet (10 mg total) by mouth daily. Resume aspirin after finishing the Xarelto. Start taking on:  09/24/2017   simvastatin 40 MG tablet Commonly known as:  ZOCOR Take 1 tablet (40 mg total) at bedtime by mouth.   vitamin E 100 UNIT capsule Take 100 Units by mouth daily.            Discharge Care Instructions  (From admission, onward)        Start     Ordered   09/22/17 0000  Change dressing    Comments:  Maintain surgical dressing until follow up in the clinic. If the edges start to pull up, may reinforce with tape. If the dressing is no longer working, may remove and cover with gauze and tape, but must keep the  area dry and clean.  Call with any questions or concerns.   09/22/17 [id number]       Signed: West Pugh. Joycelynn Fritsche   PA-C  09/22/2017, 12:20 PM

## 2017-09-22 NOTE — Evaluation (Signed)
Physical Therapy Evaluation Patient Details Name: Dorothy Rojas MRN: [id number] DOB: 03-Mar-1946 Today's Date: 09/22/2017   History of Present Illness  72 yo female s/p L TKA 09/21/17.  Clinical Impression  On eval, pt was Min guard assist for mobility. She walked ~135 feet with a RW. Pain rated 6/10 with activity. Will progress activity as tolerated. Plan is for possible d/c later today per pt. Will plan to practice stair negotiation next session.     Follow Up Recommendations DC plan and follow up therapy as arranged by surgeon    Equipment Recommendations  Rolling walker with 5" wheels    Recommendations for Other Services       Precautions / Restrictions Precautions Precautions: Fall Restrictions Weight Bearing Restrictions: No      Mobility  Bed Mobility               General bed mobility comments: standing at sink  Transfers Overall transfer level: Needs assistance Equipment used: Rolling walker (2 wheeled) Transfers: Sit to/from Stand           General transfer comment: close guard for safety. VCs hand placement  Ambulation/Gait Ambulation/Gait assistance: Min guard Ambulation Distance (Feet): 135 Feet Assistive device: Rolling walker (2 wheeled) Gait Pattern/deviations: Step-to pattern;Step-through pattern;Decreased stride length     General Gait Details: close guard for safety. cues for safety  Stairs            Wheelchair Mobility    Modified Rankin (Stroke Patients Only)       Balance                                             Pertinent Vitals/Pain Pain Assessment: 0-10 Pain Score: 6  Pain Location: L knee Pain Descriptors / Indicators: Sore;Discomfort Pain Intervention(s): Monitored during session;Repositioned;Ice applied    Home Living Family/patient expects to be discharged to:: Private residence Living Arrangements: Spouse/significant other Available Help at Discharge: Family Type of Home:  House Home Access: Stairs to enter Entrance Stairs-Rails: None Entrance Stairs-Number of Steps: 1+1??? Home Layout: Two level;Bed/bath upstairs        Prior Function Level of Independence: Independent               Hand Dominance        Extremity/Trunk Assessment   Upper Extremity Assessment Upper Extremity Assessment: Overall WFL for tasks assessed    Lower Extremity Assessment Lower Extremity Assessment: Generalized weakness(s/p L TKA)    Cervical / Trunk Assessment Cervical / Trunk Assessment: Normal  Communication   Communication: No difficulties  Cognition Arousal/Alertness: Awake/alert Behavior During Therapy: WFL for tasks assessed/performed Overall Cognitive Status: Within Functional Limits for tasks assessed                                        General Comments      Exercises Total Joint Exercises Ankle Circles/Pumps: AROM;10 reps;Supine Quad Sets: AROM;Both;10 reps;Supine Heel Slides: AROM;Left;10 reps;Supine Hip ABduction/ADduction: AROM;Left;10 reps;Supine Straight Leg Raises: AROM;Left;10 reps;Supine Long Arc Quad: AROM;Left;10 reps;Seated   Assessment/Plan    PT Assessment Patient needs continued PT services  PT Problem List Decreased strength;Decreased range of motion;Decreased balance;Decreased mobility;Decreased knowledge of use of DME;Pain;Decreased activity tolerance       PT Treatment Interventions DME instruction;Gait training;Functional mobility  training;Therapeutic activities;Balance training;Patient/family education;Stair training;Therapeutic exercise    PT Goals (Current goals can be found in the Care Plan section)  Acute Rehab PT Goals Patient Stated Goal: regain PLOF. Less pain. PT Goal Formulation: With patient Time For Goal Achievement: 10/06/17 Potential to Achieve Goals: Good    Frequency 7X/week   Barriers to discharge        Co-evaluation               AM-PAC PT "6 Clicks" Daily  Activity  Outcome Measure Difficulty turning over in bed (including adjusting bedclothes, sheets and blankets)?: A Little Difficulty moving from lying on back to sitting on the side of the bed? : A Little Difficulty sitting down on and standing up from a chair with arms (e.g., wheelchair, bedside commode, etc,.)?: A Little Help needed moving to and from a bed to chair (including a wheelchair)?: A Little Help needed walking in hospital room?: A Little Help needed climbing 3-5 steps with a railing? : A Little 6 Click Score: 18    End of Session Equipment Utilized During Treatment: Gait belt Activity Tolerance: Patient tolerated treatment well Patient left: in chair;with call bell/phone within reach   PT Visit Diagnosis: Muscle weakness (generalized) (M62.81);Difficulty in walking, not elsewhere classified (R26.2);Pain Pain - Right/Left: Left Pain - part of body: Knee    Time: 0910-0921 PT Time Calculation (min) (ACUTE ONLY): 11 min   Charges:   PT Evaluation $PT Eval Low Complexity: 1 Low     PT G Codes:          Weston Anna, MPT Pager: [phone]

## 2017-09-22 NOTE — Evaluation (Signed)
Occupational Therapy Evaluation Patient Details Name: Dorothy Rojas MRN: [id number] DOB: Jul 26, 1946 Today's Date: 09/22/2017    History of Present Illness 72 yo female s/p L TKA 09/21/17.   Clinical Impression   This 72 y/o F presents with the above. At baseline Pt is independent with ADLs and functional mobility. Pt completed room level functional mobility and functional transfers at RW level with MinGuard assist this session, currently requires MinA for LB ADLs secondary to LLE functional limitations. Pt will return home with spouse who is able to assist with ADLs PRN. Education provided and questions answered throughout session. No further acute OT needs identified at this time. Will sign off.     Follow Up Recommendations  DC plan and follow up therapy as arranged by surgeon;Supervision/Assistance - 24 hour    Equipment Recommendations  None recommended by OT           Precautions / Restrictions Precautions Precautions: Fall;Knee Restrictions Weight Bearing Restrictions: No      Mobility Bed Mobility Overal bed mobility: Needs Assistance Bed Mobility: Sit to Supine       Sit to supine: Min guard   General bed mobility comments: MinGuard for safety, no physical assist needed   Transfers Overall transfer level: Needs assistance Equipment used: Rolling walker (2 wheeled) Transfers: Sit to/from Stand Sit to Stand: Min guard         General transfer comment: close guard for safety. VCs hand placement                                               ADL either performed or assessed with clinical judgement   ADL Overall ADL's : Needs assistance/impaired Eating/Feeding: Modified independent;Sitting   Grooming: Min guard;Standing   Upper Body Bathing: Set up;Sitting   Lower Body Bathing: Sit to/from stand;Min guard   Upper Body Dressing : Set up;Sitting   Lower Body Dressing: Minimal assistance;Sit to/from stand   Toilet Transfer: Min  guard;Ambulation;Regular Toilet;RW Toilet Transfer Details (indicate cue type and reason): simulated in transfer to/from recliner  Toileting- Water quality scientist and Hygiene: Min guard;Sit to/from stand   Tub/ Shower Transfer: Walk-in shower;Min guard;Ambulation;Shower seat;Rolling walker   Functional mobility during ADLs: Min guard;Rolling walker General ADL Comments: education provided on safety and compensatory techniques for completing ADLs                          Pertinent Vitals/Pain Pain Assessment: 0-10 Pain Score: 6  Pain Location: L knee Pain Descriptors / Indicators: Sore;Discomfort Pain Intervention(s): Limited activity within patient's tolerance;Monitored during session;Ice applied          Extremity/Trunk Assessment Upper Extremity Assessment Upper Extremity Assessment: Overall WFL for tasks assessed   Lower Extremity Assessment Lower Extremity Assessment: Defer to PT evaluation   Cervical / Trunk Assessment Cervical / Trunk Assessment: Normal   Communication Communication Communication: No difficulties   Cognition Arousal/Alertness: Awake/alert Behavior During Therapy: WFL for tasks assessed/performed Overall Cognitive Status: Within Functional Limits for tasks assessed                                                     Home Living Family/patient expects to be  discharged to:: Private residence Living Arrangements: Spouse/significant other Available Help at Discharge: Family Type of Home: House Home Access: Stairs to enter Technical brewer of Steps: 1+1??? Entrance Stairs-Rails: None Home Layout: Two level;Bed/bath upstairs     Bathroom Shower/Tub: Occupational psychologist: Standard     Home Equipment: Bedside commode;Shower seat          Prior Functioning/Environment Level of Independence: Independent                 OT Problem List: Decreased strength;Decreased range of  motion;Decreased activity tolerance;Decreased knowledge of use of DME or AE;Decreased knowledge of precautions            OT Goals(Current goals can be found in the care plan section) Acute Rehab OT Goals Patient Stated Goal: regain PLOF. Less pain. OT Goal Formulation: All assessment and education complete, DC therapy                           AM-PAC PT "6 Clicks" Daily Activity     Outcome Measure Help from another person eating meals?: None Help from another person taking care of personal grooming?: None Help from another person toileting, which includes using toliet, bedpan, or urinal?: A Little Help from another person bathing (including washing, rinsing, drying)?: A Little Help from another person to put on and taking off regular upper body clothing?: None Help from another person to put on and taking off regular lower body clothing?: A Little 6 Click Score: 21   End of Session Equipment Utilized During Treatment: Gait belt;Rolling walker Nurse Communication: Mobility status  Activity Tolerance: Patient tolerated treatment well Patient left: in chair;with call bell/phone within reach  OT Visit Diagnosis: Other abnormalities of gait and mobility (R26.89)                Time: 1015-1040 OT Time Calculation (min): 25 min Charges:  OT General Charges $OT Visit: 1 Visit OT Evaluation $OT Eval Low Complexity: 1 Low G-Codes:     Lou Cal, OT Pager [phone] 09/22/2017   [name] 09/22/2017, 12:00 PM

## 2017-09-22 NOTE — Progress Notes (Deleted)
Physician Discharge Summary  Patient ID: Dorothy Rojas MRN: [id number] DOB/AGE: 09/06/1945 72 y.o.  Admit date: 09/21/2017 Discharge date:  09/22/2017  Procedures:  Procedure(s) (LRB): LEFT TOTAL KNEE ARTHROPLASTY (Left)  Attending Physician:  Dr. Paralee Cancel   Admission Diagnoses:   Left knee primary OA / pain  Discharge Diagnoses:  Principal Problem:   S/P left TKA Active Problems:   S/P total knee replacement  Past Medical History:  Diagnosis Date  . Anemia   . Arthritis    knees  . Chronic constipation   . Colon polyp 06/2015   tubular adenoma (sigmoid)  . Diverticulosis    seen on colonoscopy 06/2015  . GERD (gastroesophageal reflux disease)    normal EGD 06/2015  . Heterozygous factor V Leiden mutation [facility]) dx 07/2013     hematologist--  dr Bernadene Bell ([facility])--  per note "Low Risk"  . History of DVT of lower extremity    dx 07-28-2013  left lower extrem.  --  xarelto for 6 months  . Hypertension   . Lymphadenopathy, inguinal    bilateral  . Microscopic hematuria    chronic   . Multinodular goiter   . Multinodular thyroid    a. Path benign 2013.  [name] OAB (overactive bladder)   . Osteopenia    last DEXA 10/2009  . Pre-diabetes    no meds, managaed with lifestyle changes   . Prediabetes   . SUI (stress urinary incontinence, female)   . Tricuspid regurgitation    mild-mod, noted on echo 03/2016    HPI:    Dorothy Rojas, 72 y.o. female, has a history of pain and functional disability in the left knee due to arthritis and has failed non-surgical conservative treatments for greater than 12 weeks to include NSAID's and/or analgesics, corticosteriod injections, viscosupplementation injections and activity modification.  Onset of symptoms was gradual, starting >10 years ago with gradually worsening course since that time. The patient noted no past surgery on the left knee(s).  Patient currently rates pain in the left knee(s) at 10 out of 10  with activity. Patient has night pain, worsening of pain with activity and weight bearing, pain that interferes with activities of daily living, pain with passive range of motion, crepitus and joint swelling.  Patient has evidence of periarticular osteophytes and joint space narrowing by imaging studies.  There is no active infection.   Risks, benefits and expectations were discussed with the patient.  Risks including but not limited to the risk of anesthesia, blood clots, nerve damage, blood vessel damage, failure of the prosthesis, infection and up to and including death.  Patient understand the risks, benefits and expectations and wishes to proceed with surgery.   PCP: [name], MD   Discharged Condition: good  Hospital Course:  Patient underwent the above stated procedure on 09/21/2017. Patient tolerated the procedure well and brought to the recovery room in good condition and subsequently to the floor.  POD #1 BP: 118/53 ; Pulse: 71 ; Temp: 98.3 F (36.8 C) ; Resp: 16 Patient reports pain as mild to moderate, no events.  Feeling pretty good about everything this am. Neurovascular intact and incision: dressing C/D/I.   LABS  Basename    HGB     10.9  HCT     31.0    Discharge Exam: General appearance: alert, cooperative and no distress Extremities: Homans sign is negative, no sign of DVT, no edema, redness or tenderness in the calves or thighs  and no ulcers, gangrene or trophic changes  Disposition: Home with follow up in 2 weeks   Follow-up Information    Paralee Cancel, MD. Schedule an appointment as soon as possible for a visit in 2 week(s).   Specialty:  Orthopedic Surgery Contact information: [address] [place] [address] [phone]           Discharge Instructions    Call MD / Call 911   Complete by:  As directed    If you experience chest pain or shortness of breath, CALL 911 and be transported to the hospital emergency room.  If you  develope a fever above 101 F, pus (white drainage) or increased drainage or redness at the wound, or calf pain, call your surgeon's office.   Change dressing   Complete by:  As directed    Maintain surgical dressing until follow up in the clinic. If the edges start to pull up, may reinforce with tape. If the dressing is no longer working, may remove and cover with gauze and tape, but must keep the area dry and clean.  Call with any questions or concerns.   Constipation Prevention   Complete by:  As directed    Drink plenty of fluids.  Prune juice may be helpful.  You may use a stool softener, such as Colace (over the counter) 100 mg twice a day.  Use MiraLax (over the counter) for constipation as needed.   Diet - low sodium heart healthy   Complete by:  As directed    Discharge instructions   Complete by:  As directed    Maintain surgical dressing until follow up in the clinic. If the edges start to pull up, may reinforce with tape. If the dressing is no longer working, may remove and cover with gauze and tape, but must keep the area dry and clean.  Follow up in 2 weeks at [facility]. Call with any questions or concerns.   Increase activity slowly as tolerated   Complete by:  As directed    Weight bearing as tolerated with assist device (walker, cane, etc) as directed, use it as long as suggested by your surgeon or therapist, typically at least 4-6 weeks.   TED hose   Complete by:  As directed    Use stockings (TED hose) for 2 weeks on both leg(s).  You may remove them at night for sleeping.      Allergies as of 09/22/2017      Reactions   Naprosyn [naproxen] Rash      Medication List    STOP taking these medications   acetaminophen 500 MG tablet Commonly known as:  TYLENOL   aspirin EC 81 MG tablet   ciprofloxacin 250 MG tablet Commonly known as:  CIPRO   conjugated estrogens vaginal cream Commonly known as:  PREMARIN   ibandronate 150 MG tablet Commonly known  as:  BONIVA   ibuprofen 200 MG tablet Commonly known as:  ADVIL,MOTRIN     TAKE these medications   CALCIUM + D PO Take 1 tablet by mouth daily.   docusate sodium 100 MG capsule Commonly known as:  COLACE Take 1 capsule (100 mg total) by mouth 2 (two) times daily.   esomeprazole 20 MG capsule Commonly known as:  NEXIUM Take 20 mg by mouth daily as needed (acid reflux).   ferrous sulfate 325 (65 FE) MG tablet Commonly known as:  FERROUSUL Take 1 tablet (325 mg total) by mouth 3 (three)  times daily with meals.   HYDROcodone-acetaminophen 7.5-325 MG tablet Commonly known as:  NORCO Take 1-2 tablets by mouth every 4 (four) hours as needed for moderate pain.   lisinopril-hydrochlorothiazide 20-12.5 MG tablet Commonly known as:  PRINZIDE,ZESTORETIC Take 1 tablet daily by mouth.   methocarbamol 500 MG tablet Commonly known as:  ROBAXIN Take 1 tablet (500 mg total) by mouth every 6 (six) hours as needed for muscle spasms.   multivitamin with minerals tablet Take 1 tablet by mouth daily.   nitrofurantoin 100 MG capsule Commonly known as:  MACRODANTIN Take 100 mg by mouth daily.   polyethylene glycol packet Commonly known as:  MIRALAX / GLYCOLAX Take 17 g by mouth 2 (two) times daily.   rivaroxaban 10 MG Tabs tablet Commonly known as:  XARELTO Take 1 tablet (10 mg total) by mouth daily. Resume aspirin after finishing the Xarelto. Start taking on:  09/24/2017   simvastatin 40 MG tablet Commonly known as:  ZOCOR Take 1 tablet (40 mg total) at bedtime by mouth.   vitamin E 100 UNIT capsule Take 100 Units by mouth daily.            Discharge Care Instructions  (From admission, onward)        Start     Ordered   09/22/17 0000  Change dressing    Comments:  Maintain surgical dressing until follow up in the clinic. If the edges start to pull up, may reinforce with tape. If the dressing is no longer working, may remove and cover with gauze and tape, but must keep the  area dry and clean.  Call with any questions or concerns.   09/22/17 1779       Signed: West Pugh. Burnham Trost   PA-C  09/22/2017, 12:20 PM

## 2017-09-22 NOTE — Progress Notes (Signed)
Physical Therapy Treatment Patient Details Name: Dorothy Rojas MRN: [id number] DOB: [birth year] Today's Date: 09/22/2017    History of Present Illness 72 yo female s/p L TKA 09/21/17.    PT Comments    Progressing well with mobility. Reviewed gait training and stair training. Issued HEP for pt to perform 2x/day until she begins OP PT. All education completed. Okay to d/c from PT standpoint-made RN aware.    Follow Up Recommendations  DC plan and follow up therapy as arranged by surgeon     Equipment Recommendations  None recommended by PT    Recommendations for Other Services       Precautions / Restrictions Precautions Precautions: Fall;Knee Restrictions Weight Bearing Restrictions: No    Mobility  Bed Mobility Overal bed mobility: Needs Assistance Bed Mobility: Supine to Sit     Supine to sit: Supervision    General bed mobility comments: for safety. no assist given.   Transfers Overall transfer level: Needs assistance Equipment used: Rolling walker (2 wheeled) Transfers: Sit to/from Stand Sit to Stand: Min guard         General transfer comment: close guard for safety. VCs hand placement  Ambulation/Gait Ambulation/Gait assistance: Min guard Ambulation Distance (Feet): 200 Feet Assistive device: Rolling walker (2 wheeled) Gait Pattern/deviations: Step-through pattern;Decreased stride length     General Gait Details: close guard for safety. cues for safety   Stairs Stairs: Yes Min Assist  Stair Management: Step to pattern;Forwards;One rail Right Number of Stairs: 2(x2) General stair comments: Practiced x 2. Once with husband providing 1 HHA (to simulate home entry) and once with pt using rail on R side (to simulate steps up to bedroom). VCs safety, sequence, technique.   Wheelchair Mobility    Modified Rankin (Stroke Patients Only)       Balance                                            Cognition Arousal/Alertness:  Awake/alert Behavior During Therapy: WFL for tasks assessed/performed Overall Cognitive Status: Within Functional Limits for tasks assessed                                        Exercises      General Comments        Pertinent Vitals/Pain Pain Assessment: 0-10 Pain Score: 6  Pain Location: L knee Pain Descriptors / Indicators: Sore;Discomfort Pain Intervention(s): Monitored during session;Repositioned    Home Living Family/patient expects to be discharged to:: Private residence Living Arrangements: Spouse/significant other Available Help at Discharge: Family Type of Home: House Home Access: Stairs to enter Entrance Stairs-Rails: None Home Layout: Two level;Bed/bath upstairs Home Equipment: Bedside commode;Shower seat      Prior Function Level of Independence: Independent          PT Goals (current goals can now be found in the care plan section) Acute Rehab PT Goals Patient Stated Goal: regain PLOF. Less pain. Progress towards PT goals: Progressing toward goals    Frequency    7X/week      PT Plan Current plan remains appropriate    Co-evaluation              AM-PAC PT "6 Clicks" Daily Activity  Outcome Measure  Difficulty turning over in bed (including adjusting bedclothes,  sheets and blankets)?: A Little Difficulty moving from lying on back to sitting on the side of the bed? : A Little Difficulty sitting down on and standing up from a chair with arms (e.g., wheelchair, bedside commode, etc,.)?: A Little Help needed moving to and from a bed to chair (including a wheelchair)?: A Little Help needed walking in hospital room?: A Little Help needed climbing 3-5 steps with a railing? : A Little 6 Click Score: 18    End of Session Equipment Utilized During Treatment: Gait belt Activity Tolerance: Patient tolerated treatment well Patient left: in bed;with call bell/phone within reach;with family/visitor present   PT Visit Diagnosis:  Muscle weakness (generalized) (M62.81);Difficulty in walking, not elsewhere classified (R26.2);Pain Pain - Right/Left: Left Pain - part of body: Knee     Time: 9741-6384 PT Time Calculation (min) (ACUTE ONLY): 19 min  Charges:  $Gait Training: 8-22 mins                    G Codes:          Weston Anna, MPT Pager: [phone]

## 2017-09-22 NOTE — Progress Notes (Signed)
Patient ID: Dorothy Rojas, female   DOB: 12-04-1945, 72 y.o.   MRN: [id number] Subjective: 1 Day Post-Op Procedure(s) (LRB): LEFT TOTAL KNEE ARTHROPLASTY (Left)    Patient reports pain as mild to moderate, no events.  Feeling pretty good about everything this am  Objective:   VITALS:   Vitals:   09/22/17 0126 09/22/17 0624  BP: (!) 125/59 (!) 118/53  Pulse: 76 71  Resp: 15 16  Temp: 97.8 F (36.6 C) 98.3 F (36.8 C)  SpO2: 98% 93%    Neurovascular intact Incision: dressing C/D/I  LABS Recent Labs    09/22/17 0543  HGB 10.9*  HCT 31.0*  WBC 15.4*  PLT 179    Recent Labs    09/22/17 0543  NA 139  K 3.9  BUN 17  CREATININE 0.76  GLUCOSE 206*    No results for input(s): LABPT, INR in the last 72 hours.   Assessment/Plan: 1 Day Post-Op Procedure(s) (LRB): LEFT TOTAL KNEE ARTHROPLASTY (Left)   Advance diet Up with therapy  Home after therapy this am Reviewed goals Outpt PT already set up RTC in 2 weeks

## 2017-09-22 NOTE — Care Management Obs Status (Signed)
[facility] NOTIFICATION   Patient Details  Name: Dorothy Rojas MRN: [id number] Date of Birth: [birth year]   Medicare Observation Status Notification Given:  Yes    Guadalupe Maple, RN 09/22/2017, 12:52 PM

## 2017-09-24 ENCOUNTER — Encounter: Payer: Self-pay | Admitting: Family Medicine

## 2017-09-25 DIAGNOSIS — M25562 Pain in left knee: Secondary | ICD-10-CM | POA: Diagnosis not present

## 2017-09-28 DIAGNOSIS — M25562 Pain in left knee: Secondary | ICD-10-CM | POA: Diagnosis not present

## 2017-09-30 DIAGNOSIS — M25562 Pain in left knee: Secondary | ICD-10-CM | POA: Diagnosis not present

## 2017-10-02 DIAGNOSIS — M25562 Pain in left knee: Secondary | ICD-10-CM | POA: Diagnosis not present

## 2017-10-05 DIAGNOSIS — M25562 Pain in left knee: Secondary | ICD-10-CM | POA: Diagnosis not present

## 2017-10-07 DIAGNOSIS — M25562 Pain in left knee: Secondary | ICD-10-CM | POA: Diagnosis not present

## 2017-10-09 DIAGNOSIS — M25562 Pain in left knee: Secondary | ICD-10-CM | POA: Diagnosis not present

## 2017-10-12 DIAGNOSIS — M25562 Pain in left knee: Secondary | ICD-10-CM | POA: Diagnosis not present

## 2017-10-14 DIAGNOSIS — M25562 Pain in left knee: Secondary | ICD-10-CM | POA: Diagnosis not present

## 2017-10-16 DIAGNOSIS — M25562 Pain in left knee: Secondary | ICD-10-CM | POA: Diagnosis not present

## 2017-10-19 DIAGNOSIS — M25562 Pain in left knee: Secondary | ICD-10-CM | POA: Diagnosis not present

## 2017-10-21 DIAGNOSIS — M25562 Pain in left knee: Secondary | ICD-10-CM | POA: Diagnosis not present

## 2017-10-23 DIAGNOSIS — M25562 Pain in left knee: Secondary | ICD-10-CM | POA: Diagnosis not present

## 2017-10-26 DIAGNOSIS — M25562 Pain in left knee: Secondary | ICD-10-CM | POA: Diagnosis not present

## 2017-10-30 DIAGNOSIS — M25562 Pain in left knee: Secondary | ICD-10-CM | POA: Diagnosis not present

## 2017-11-02 DIAGNOSIS — M25562 Pain in left knee: Secondary | ICD-10-CM | POA: Diagnosis not present

## 2017-11-04 DIAGNOSIS — Z96652 Presence of left artificial knee joint: Secondary | ICD-10-CM | POA: Diagnosis not present

## 2017-11-04 DIAGNOSIS — Z471 Aftercare following joint replacement surgery: Secondary | ICD-10-CM | POA: Diagnosis not present

## 2017-11-06 DIAGNOSIS — M25562 Pain in left knee: Secondary | ICD-10-CM | POA: Diagnosis not present

## 2017-11-09 DIAGNOSIS — M25562 Pain in left knee: Secondary | ICD-10-CM | POA: Diagnosis not present

## 2017-11-13 DIAGNOSIS — M25562 Pain in left knee: Secondary | ICD-10-CM | POA: Diagnosis not present

## 2017-11-19 ENCOUNTER — Other Ambulatory Visit: Payer: Self-pay | Admitting: Family Medicine

## 2017-11-19 DIAGNOSIS — Z1231 Encounter for screening mammogram for malignant neoplasm of breast: Secondary | ICD-10-CM

## 2017-11-20 DIAGNOSIS — M25562 Pain in left knee: Secondary | ICD-10-CM | POA: Diagnosis not present

## 2017-11-23 DIAGNOSIS — Z961 Presence of intraocular lens: Secondary | ICD-10-CM | POA: Diagnosis not present

## 2017-12-02 DIAGNOSIS — N3020 Other chronic cystitis without hematuria: Secondary | ICD-10-CM | POA: Diagnosis not present

## 2017-12-02 DIAGNOSIS — N393 Stress incontinence (female) (male): Secondary | ICD-10-CM | POA: Diagnosis not present

## 2017-12-14 ENCOUNTER — Other Ambulatory Visit: Payer: Self-pay | Admitting: Family Medicine

## 2017-12-15 ENCOUNTER — Ambulatory Visit
Admission: RE | Admit: 2017-12-15 | Discharge: 2017-12-15 | Disposition: A | Discharge status: Home / Self Care | Payer: Medicare Other | Source: Ambulatory Visit | Attending: Family Medicine | Admitting: Family Medicine

## 2017-12-15 DIAGNOSIS — Z1231 Encounter for screening mammogram for malignant neoplasm of breast: Secondary | ICD-10-CM

## 2017-12-16 ENCOUNTER — Other Ambulatory Visit: Payer: Medicare Other

## 2017-12-20 NOTE — Progress Notes (Signed)
Chief Complaint  Patient presents with  . Medicare Wellness    fasting (labs already done) AWV with pelvic. No concerns.     Dorothy Rojas is a 72 y.o. female who presents for annual wellness visit and follow-up on chronic medical conditions.    Hypertension follow-up: Blood pressures are rarely checked at home 110-118/46-70's. Denies dizziness, chest pain. Denies side effects of medications. Denies headaches. She denies exertional chest pain. She gets intermittent mild vertigo (when sitting up in bed, sitting up from getting hair washed, very short-lived)--short lived (seconds), not worsening or increasing in frequency. She getsslight swelling in her ankles if she is on her feet a lot, by the end of the day, never present when she first wakes up.  S/p L TKR by Dr. Alvan Dame in February, finished PT, doing home exercises.  Still needs to ice it, still swells. Takes 2 Advil PM every evening still.  H/o  frozen shoulder on the left, completed physical therapy in the past.  Has home exercise regimen, which she does periodically.  Denies pain.  GERD: She uses Nexium OTC prn, just once or twice a month. Denies any dysphagia.   Hyperlipidemia follow-up: Patient is reportedly following a low-fat, low cholesterol diet. Compliant with medications and denies medication side effects.  Lab Results  Component Value Date   CHOL 143 06/11/2017   HDL 48 (L) 06/11/2017   LDLCALC 72 06/11/2017   TRIG 142 06/11/2017   CHOLHDL 3.0 06/11/2017    H/o DVT--she completed 6 months of xarelto.She was discharged from the care of hematologist. If any recurrence, will need lifelong treatment.  She is taking 81mg  of ASA daily. No other blood thinning agents were recommended.She took xarelto after knee replacement surgery, and didn't have any problems.  Denies any bleeding/bruising. Not a candidate for Evista for osteoporosis treatment given her DVT. We discussed risks of estrogen at last visit--using  topical cream sparingly.  Constipation--doing well, on stool softener once daily, high fiber diet.  MNG--denies symptoms/dysphagia. Every once in a while she gets a short-lived discomfort on the left side of her neck. She feels like there is a developing/worsening asymmetry to her neck. Last [place] 03/2012.  Carotid atherosclerosis. Patient denies any neurologic symptoms. Dr. Einar Gip recommended not needing again unless had bruit or symptoms, last carotid dopplers 04/2016.  DOE: She had normal spirometry, normal CXR. She was referred to Dr. Einar Gip, who did nuclear stress test in August 2017. Stress EKG was+ for myocardial ischemia (34mm downsloping ST seg depression, with sx of SOB). Perfusion scan--no definite ischemia (breast attenuation noted), considered alow risk scan.Echo showed mild-mod tricuspid regurg.Shelast saw him in December, 2017. He felt that her DOEwas likely related to mild underlying COPD. He will see her again in 2 years, sooner if needed.   Isn't back to walking routine (related to her knee surgery and friend's travel).  Plans to (previously walked with a friend in the neighborhood, talking the whole time, and sometimes notes some shortness of breath, just slight (when talking and walking).  No longer having any pain in the shoulderblades like she used to.    She has chronic microscopic hematuria, under the care of Dr. McDiarmid. She takesnitrofurantoin once daily for UTI prevention (switched form Trimethoprim after getting break-through infections, in Dec/January), and hasn't had any infections since the switch. Got second Macroplastique procedure in September 2016, and ultimately needed periurethral biopsy for abnormal finding on CT. 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 She had good results  from macroplastique.  Urinary incontinence is overall pretty well controlled.  Osteoporosis: She was started on Boniva in May 2017, after her DEXA done  12/10/15 showed T -2.5 at R fem neck. She is tolerating this without side effects. Denies dysphagia or chest pain.  She did not have DEXA done when she went for her mammogram last week. Takes Ca+D once daily   Immunization History  Administered Date(s) Administered  . Influenza Whole 04/09/2011  . Influenza, High Dose Seasonal PF 05/08/2014, 05/01/2015, 05/26/2016, 05/22/2017  . Influenza,inj,Quad PF,6+ Mos 04/14/2013  . Influenza-Unspecified 05/22/2017  . Pneumococcal Conjugate-13 11/01/2014  . Pneumococcal Polysaccharide-23 04/09/2011  . Td 08/07/2006  . Tdap 11/01/2014  . Zoster 10/25/2008   Last Pap smear: can't recall. S/p hysterectomy for benign reasons  Last mammogram: last week Last colonoscopy: 06/2015 (along with EGD) with Dr. Henrene Pastor. Had adenomatous polyp, due again in 2021 Last DEXA: 12/2015, T-2.5 at R fem neck Ophtho: yearly  Dentist: regularly, with dentist and periodontist every 3-4 months  Exercise: Not walking much since surgery. [employer]--only once since completing PT.   Other doctors caring for patient include: Dr. McDiarmid (urology)  Dr. Gershon Crane (ophtho)  Dr. Henrene Pastor (GI) Dr. Conley Canal (dentist)  Dr. Juliann Mule (hematologist)--released from his care Dr. Alvan Dame (ortho-knee) Dr. Onnie Graham (ortho--shoulder) Dr. Essie Hart (periodontist) Dr. Wilhemina Bonito (dermatologist)   Depression screen: negative Fall Screen: 1 fall in the last year--related to vertigo, getting up from the floor during yoga; no injury Functional Status screen: notable only for urinary incontinence, some trouble hearing in crowded rooms, sometimes with the TV. Mini-Cog screen: normal (5) See epic for full screens   End of Life Discussion: Patient hasa living will and medical power of attorney  Past Medical History:  Diagnosis Date  . Anemia   . Arthritis    knees  . Chronic constipation   . Colon polyp 06/2015   tubular adenoma (sigmoid)  . Diverticulosis    seen on colonoscopy 06/2015   . GERD (gastroesophageal reflux disease)    normal EGD 06/2015  . Heterozygous factor V Leiden mutation [facility]) dx 07/2013     hematologist--  dr Bernadene Bell ([facility])--  per note "Low Risk"  . History of DVT of lower extremity    dx 07-28-2013  left lower extrem.  --  xarelto for 6 months  . Hypertension   . Lymphadenopathy, inguinal    bilateral  . Microscopic hematuria    chronic   . Multinodular goiter   . Multinodular thyroid    a. Path benign 2013.  [name] OAB (overactive bladder)   . Osteoporosis 2017   osteopenia since 2011; osteoporosis 12/2015  . Pre-diabetes    no meds, managaed with lifestyle changes   . Prediabetes   . SUI (stress urinary incontinence, female)   . Tricuspid regurgitation    mild-mod, noted on echo 03/2016    Past Surgical History:  Procedure Laterality Date  . APPENDECTOMY  1973  . CATARACT EXTRACTION W/ INTRAOCULAR LENS  IMPLANT, BILATERAL Bilateral 2015  . CYSTOCELE REPAIR  12-03-2010  . CYSTOSCOPY MACROPLASTIQUE IMPLANT N/A 12/12/2014   Procedure: CYSTOSCOPY MACROPLASTIQUE INJECTION;  Surgeon: Bjorn Loser, MD;  Location: [facility];  Service: Urology;  Laterality: N/A;  . CYSTOSCOPY MACROPLASTIQUE IMPLANT N/A 04/12/2015   Procedure: CYSTOSCOPY MACROPLASTIQUE IMPLANT;  Surgeon: Bjorn Loser, MD;  Location: [facility];  Service: Urology;  Laterality: N/A;  . INCONTINENCE SURGERY  04/1999   Re-do at [facility] for sling complications in 0086  .  KNEE ARTHROSCOPY Right 04-05-2013  . LATERAL EPICONDYLE RELEASE Right   . NEUROMA SURGERY Bilateral 1980's    feet  . OVARIAN CYST REMOVAL Left Nov 10, 1971  . PUBOVAGINAL SLING N/A 07/30/2015   Procedure: PELVIC EXAM UNDER ANESTHESIA, TRANSVAGINAL BIOPSY OF PERIURETHRAL MASS;  Surgeon: Raynelle Bring, MD;  Location: [facility];  Service: Urology;  Laterality: N/A;  43 MINS   . TONSILLECTOMY  as child  . TOTAL ABDOMINAL HYSTERECTOMY W/ BILATERAL SALPINGOOPHORECTOMY  09/  2000  .  TOTAL KNEE ARTHROPLASTY Left 09/21/2017   Procedure: LEFT TOTAL KNEE ARTHROPLASTY;  Surgeon: Paralee Cancel, MD;  Location: [facility];  Service: Orthopedics;  Laterality: Left;  90 mins  . TRANSTHORACIC ECHOCARDIOGRAM  05-04-2014   normal ,  ef 60-65%    Social History   Socioeconomic History  . Marital status: Married    Spouse name: Not on file  . Number of children: 3  . Years of education: Not on file  . Highest education level: Not on file  Occupational History  . Occupation: Retired    Fish farm manager: Database administrator  Social Needs  . Financial resource strain: Not on file  . Food insecurity:    Worry: Not on file    Inability: Not on file  . Transportation needs:    Medical: Not on file    Non-medical: Not on file  Tobacco Use  . Smoking status: Former Smoker    Packs/day: 1.00    Years: 21.00    Pack years: 21.00    Types: Cigarettes    Last attempt to quit: 08/12/1983    Years since quitting: 34.3  . Smokeless tobacco: Never Used  Substance and Sexual Activity  . Alcohol use: Yes    Alcohol/week: 0.0 oz    Comment: 1 glass of wine most days of the week (4oz)  . Drug use: No  . Sexual activity: Not Currently  Lifestyle  . Physical activity:    Days per week: Not on file    Minutes per session: Not on file  . Stress: Not on file  Relationships  . Social connections:    Talks on phone: Not on file    Gets together: Not on file    Attends religious service: Not on file    Active member of club or organization: Not on file    Attends meetings of clubs or organizations: Not on file    Relationship status: Not on file  . Intimate partner violence:    Fear of current or ex partner: Not on file    Emotionally abused: Not on file    Physically abused: Not on file    Forced sexual activity: Not on file  Other Topics Concern  . Not on file  Social History Narrative   Married.  Lives with husband.  Children live in [place], [place].  Youngest child was killed in [place].  3  stepchildren are all local. 5 grandchildren (2 in [place], 2 from step-daughter in [place], and another in [place]).       1 dog (Muffin lost vision 11/09/13 due to retinal detachment)--died in 11-09-16.    Family History  Problem Relation Age of Onset  . Hypertension Father   . Heart disease Father 57       CABG, died from MI at 66  . COPD Father   . Cirrhosis Mother        related to psoriasis medications  . Diabetes Mother   . Psoriasis Mother   .  Osteogenesis imperfecta Sister   . Heart disease Sister        CHF  . Diabetes Brother   . Heart disease Brother 32       MI  . Deep vein thrombosis Brother        factor V Leiden NEGATIVE  . Cancer Neg Hx     Outpatient Encounter Medications as of 12/21/2017  Medication Sig Note  . Calcium Citrate-Vitamin D (CALCIUM + D PO) Take 1 tablet by mouth daily.   [name] docusate sodium (COLACE) 100 MG capsule Take 1 capsule (100 mg total) by mouth 2 (two) times daily.   [name] ibandronate (BONIVA) 150 MG tablet TAKE ONE TABLET ONCE A MONTH IN THE MORNING ON AN EMPTY STOMACH WITH WATER. REMAIN UPRIGHT.   [name] Ibuprofen-diphenhydrAMINE Cit (ADVIL PM PO) Take 2 tablets by mouth at bedtime.   [name] lisinopril-hydrochlorothiazide (PRINZIDE,ZESTORETIC) 20-12.5 MG tablet Take 1 tablet daily by mouth.   . Multiple Vitamins-Minerals (MULTIVITAMIN WITH MINERALS) tablet Take 1 tablet by mouth daily.   . nitrofurantoin (MACRODANTIN) 100 MG capsule Take 100 mg by mouth daily. 09/03/2017: Preventative uti med will start after cipro therapy is complete   . simvastatin (ZOCOR) 40 MG tablet Take 1 tablet (40 mg total) at bedtime by mouth.   . vitamin E 100 UNIT capsule Take 100 Units by mouth daily.   [name] esomeprazole (NEXIUM) 20 MG capsule Take 20 mg by mouth daily as needed (acid reflux). 12/21/2017: Uses prn, infrequently  . polyethylene glycol (MIRALAX / GLYCOLAX) packet Take 17 g by mouth 2 (two) times daily. (Patient not taking: Reported on 12/21/2017)   . [DISCONTINUED] ferrous sulfate  (FERROUSUL) 325 (65 FE) MG tablet Take 1 tablet (325 mg total) by mouth 3 (three) times daily with meals.   . [DISCONTINUED] HYDROcodone-acetaminophen (NORCO) 7.5-325 MG tablet Take 1-2 tablets by mouth every 4 (four) hours as needed for moderate pain.   . [DISCONTINUED] methocarbamol (ROBAXIN) 500 MG tablet Take 1 tablet (500 mg total) by mouth every 6 (six) hours as needed for muscle spasms.   . [DISCONTINUED] rivaroxaban (XARELTO) 10 MG TABS tablet Take 1 tablet (10 mg total) by mouth daily. Resume aspirin after finishing the Xarelto.    No facility-administered encounter medications on file as of 12/21/2017.     Allergies  Allergen Reactions  . Naprosyn [Naproxen] Rash    ROS:The patient denies anorexia, fever, headaches, vision changes, ear pain, sore throat, breast concerns, chest pain, palpitations, syncope, cough, swelling, nausea, vomiting, diarrhea, abdominal pain, melena, hematochezia, hematuria, dysuria, vaginal bleeding, discharge, odor or itch, genital lesions, numbness, tingling, weakness, tremor, suspicious skin lesions, depression, anxiety, abnormal bleeding or enlarged lymph nodes.  Constipation--controlled with stool softeners Intermittent vertigo, usually only when she sits up quickly from laying down, short-lived. Urinary incontinence stable Some palpitations/beating hard when going up the stairs after knee surgery, improving. Some sneezing/allergies some afternoons, not daily. Left shoulder is much better.  Left knee still swells some, improving. Denies back pain (previously saw Dr. Reather Littler PA, dx DDD, resolved with prednisone x6d).  Some hearing loss/trouble, especially in crowds, restaurants   PHYSICAL EXAM:  BP 120/64   Pulse 80   Ht 5\' 4"  (1.626 m)   Wt 137 lb (62.1 kg)   LMP  (LMP Unknown)   BMI 23.52 kg/m   Wt Readings from Last 3 Encounters:  12/21/17 137 lb (62.1 kg)  09/21/17 137 lb (62.1 kg)  09/15/17 137 lb (62.1 kg)    132# 6.4oz at  her  physical last year.  General Appearance:  Alert, cooperative, no distress, appears stated age   Head:  Normocephalic, without obvious abnormality, atraumatic   Eyes:  PERRL, conjunctiva/corneas clear, EOM's intact, fundi benign   Ears:  Normal TM's and external ear canals   Nose:  Nares with mild edema, clear drainage; no erythema or sinus tenderness  Throat:  Lips, mucosa, and tongue normal; teeth and gums normal   Neck:  Supple, no lymphadenopathy; thyroid: mild enlargement, no nodules; no tenderness/nodules; no carotid bruit or JVD   Back:  Spine nontender, no curvature, ROM normal, no CVA tenderness.   Lungs:  Clear to auscultation bilaterally without wheezes, rales or ronchi; respirations unlabored   Chest Wall:  No tenderness or deformity   Heart:  Regular rate and rhythm, S1 and S2 normal, no murmur, rub or gallop   Breast Exam:  No tenderness, masses, or nipple discharge. Nipples are inverted bilaterally, L>R (chronic, per pt). No axillary lymphadenopathy.   Abdomen:  Soft, non-tender, nondistended, normoactive bowel sounds, no masses, no hepatosplenomegaly   Genitalia:  Normal external genitalia without lesions. BUS and vagina normal; Uterus and adnexa surgically absent. No masses palpable   Rectal:  Normal tone, no masses or tenderness; guaiac-negative stool. Slightly diminished sphincter tone, unchanged  Extremities:  No clubbing, cyanosis or edema.L knee with effusion, warmth, WHSS. No erythema. Decreased extension  Pulses:  2+ and symmetric all extremities   Skin:  Skin color, texture, turgor normal, no rashes or suspicious lesions.  Many SK's.   Lymph nodes:  Cervical, supraclavicular, and axillary nodes normal   Neurologic:  CNII-XII intact, normal strength, sensation and gait; reflexes 2+ and symmetric throughout    Psych: Normal mood, affect, hygiene and  grooming  ASSESSMENT/PLAN:  Medicare annual wellness visit, subsequent  Essential hypertension, benign - controlled - Plan: Comprehensive metabolic panel, lisinopril-hydrochlorothiazide (PRINZIDE,ZESTORETIC) 20-12.5 MG tablet  Pure hypercholesterolemia - Plan: Lipid panel, Comprehensive metabolic panel, simvastatin (ZOCOR) 40 MG tablet  History of DVT (deep vein thrombosis) - continue aspirin  Heterozygous factor V Leiden mutation (HCC)  Multinodular goiter - possible increase in size/asymmetry per pt--recheck [place] - Plan: TSH, US Soft Tissue Head/Neck  Impaired fasting glucose - Plan: Hemoglobin A1c, Comprehensive metabolic panel  OAB (overactive bladder)  Osteoporosis without current pathological fracture, unspecified osteoporosis type - due for DEXA - Plan: DG Bone Density  Medication monitoring encounter - Plan: CBC with Differential/Platelet, Lipid panel, Comprehensive metabolic panel  Gross hematuria - resolved (episode in past) - Plan: CBC with Differential/Platelet  Pure hypercholesterolemia - due or recheck; continue lowfat, low cholesterol diet - Plan: Lipid panel, Comprehensive metabolic panel, simvastatin (ZOCOR) 40 MG tablet  F/u Dr. Einar Gip due 07/2018 (sooner prn)   Discussed monthly self breast exams and yearly mammograms; at least 30 minutes of aerobic activity at least 5 days/week and weight-bearing exercise 2x/week; proper sunscreen use reviewed; healthy diet, including goals of calcium and vitamin D intake and alcohol recommendations (less than or equal to 1 drink/day) reviewed; regular seatbelt use; changing batteries in smoke detectors. Immunization recommendations discussed-- continue yearly flu shots. Shingrix recommended, risks/side effects discussed, to get from pharmacy. Colonoscopy recommendations reviewed, due in 2021. DEXA due now, to call breast center to schedule  MOST form reviewed and updated. Full Code, Full Care  F/u 6 mos for med  check.    Medicare Attestation I have personally reviewed: The patient's medical and social history Their use of alcohol, tobacco or illicit drugs Their current medications and  supplements The patient's functional ability including ADLs,fall risks, home safety risks, cognitive, and hearing and visual impairment Diet and physical activities Evidence for depression or mood disorders  The patient's weight, height and BMI have been recorded in the chart.  I have made referrals, counseling, and provided education to the patient based on review of the above and I have provided the patient with a written personalized care plan for preventive services.

## 2017-12-21 ENCOUNTER — Encounter: Payer: Self-pay | Admitting: Family Medicine

## 2017-12-21 ENCOUNTER — Ambulatory Visit (INDEPENDENT_AMBULATORY_CARE_PROVIDER_SITE_OTHER): Payer: Medicare Other | Admitting: Family Medicine

## 2017-12-21 VITALS — BP 120/64 | HR 80 | Ht 64.0 in | Wt 137.0 lb

## 2017-12-21 DIAGNOSIS — I10 Essential (primary) hypertension: Secondary | ICD-10-CM | POA: Diagnosis not present

## 2017-12-21 DIAGNOSIS — M810 Age-related osteoporosis without current pathological fracture: Secondary | ICD-10-CM | POA: Diagnosis not present

## 2017-12-21 DIAGNOSIS — N3281 Overactive bladder: Secondary | ICD-10-CM | POA: Diagnosis not present

## 2017-12-21 DIAGNOSIS — R7301 Impaired fasting glucose: Secondary | ICD-10-CM

## 2017-12-21 DIAGNOSIS — E7800 Pure hypercholesterolemia, unspecified: Secondary | ICD-10-CM

## 2017-12-21 DIAGNOSIS — Z5181 Encounter for therapeutic drug level monitoring: Secondary | ICD-10-CM

## 2017-12-21 DIAGNOSIS — D6851 Activated protein C resistance: Secondary | ICD-10-CM

## 2017-12-21 DIAGNOSIS — Z0000 Encounter for general adult medical examination without abnormal findings: Secondary | ICD-10-CM

## 2017-12-21 DIAGNOSIS — E039 Hypothyroidism, unspecified: Secondary | ICD-10-CM | POA: Diagnosis not present

## 2017-12-21 DIAGNOSIS — Z86718 Personal history of other venous thrombosis and embolism: Secondary | ICD-10-CM | POA: Diagnosis not present

## 2017-12-21 DIAGNOSIS — R310 Gross hematuria: Secondary | ICD-10-CM

## 2017-12-21 DIAGNOSIS — E042 Nontoxic multinodular goiter: Secondary | ICD-10-CM | POA: Diagnosis not present

## 2017-12-21 MED ORDER — LISINOPRIL-HYDROCHLOROTHIAZIDE 20-12.5 MG PO TABS: 1.0000 | ORAL_TABLET | Freq: Every day | ORAL | 1 refills | Status: DC

## 2017-12-21 MED ORDER — SIMVASTATIN 40 MG PO TABS: 40.0000 mg | ORAL_TABLET | Freq: Every day | ORAL | 1 refills | Status: DC

## 2017-12-21 NOTE — Patient Instructions (Signed)
  HEALTH MAINTENANCE RECOMMENDATIONS:  It is recommended that you get at least 30 minutes of aerobic exercise at least 5 days/week (for weight loss, you may need as much as 60-90 minutes). This can be any activity that gets your heart rate up. This can be divided in 10-15 minute intervals if needed, but try and build up your endurance at least once a week.  Weight bearing exercise is also recommended twice weekly.  Eat a healthy diet with lots of vegetables, fruits and fiber.  "Colorful" foods have a lot of vitamins (ie green vegetables, tomatoes, red peppers, etc).  Limit sweet tea, regular sodas and alcoholic beverages, all of which has a lot of calories and sugar.  Up to 1 alcoholic drink daily may be beneficial for women (unless trying to lose weight, watch sugars).  Drink a lot of water.  Calcium recommendations are 1200-1500 mg daily (1500 mg for postmenopausal women or women without ovaries), and vitamin D 1000 IU daily.  This should be obtained from diet and/or supplements (vitamins), and calcium should not be taken all at once, but in divided doses.  Monthly self breast exams and yearly mammograms for women over the age of 55 is recommended.  Sunscreen of at least SPF 30 should be used on all sun-exposed parts of the skin when outside between the hours of 10 am and 4 pm (not just when at beach or pool, but even with exercise, golf, tennis, and yard work!)  Use a sunscreen that says "broad spectrum" so it covers both UVA and UVB rays, and make sure to reapply every 1-2 hours.  Remember to change the batteries in your smoke detectors when changing your clock times in the spring and fall.  Use your seat belt every time you are in a car, and please drive safely and not be distracted with cell phones and texting while driving.   Ms. Klassen , Thank you for taking time to come for your Medicare Wellness Visit. I appreciate your ongoing commitment to your health goals. Please review the following  plan we discussed and let me know if I can assist you in the future.   These are the goals we discussed: Goals    None      This is a list of the screening recommended for you and due dates:  Health Maintenance  Topic Date Due  . Flu Shot  03/11/2018  . Mammogram  12/16/2019  . Colon Cancer Screening  06/20/2020  . Tetanus Vaccine  10/31/2024  . DEXA scan (bone density measurement)  Completed  .  Hepatitis C: One time screening is recommended by Center for Disease Control  (CDC) for  adults born from [birth year] through 1965.   Completed  . Pneumonia vaccines  Completed   Continue yearly mammograms, due next 12/2018 (not 2021 as stated above). You are due for another bone density test now. Please call the Breast Center to schedule this.  I recommend getting the new shingles vaccine (Shingrix). You will need to check with your insurance to see if it is covered, and if covered by Medicare Part D, you need to get from the pharmacy rather than our office.  It is a series of 2 injections, spaced 2 months apart.

## 2017-12-21 NOTE — Addendum Note (Signed)
Addended by: Carolee Rota F on: 12/21/2017 09:05 AM   Modules accepted: Orders

## 2017-12-22 LAB — COMPREHENSIVE METABOLIC PANEL
A/G RATIO: 1.5 (ref 1.2–2.2)
ALBUMIN: 4.1 g/dL (ref 3.5–4.8)
ALT: 26 IU/L (ref 0–32)
AST: 26 IU/L (ref 0–40)
Alkaline Phosphatase: 53 IU/L (ref 39–117)
BILIRUBIN TOTAL: 0.3 mg/dL (ref 0.0–1.2)
BUN / CREAT RATIO: 20 (ref 12–28)
BUN: 15 mg/dL (ref 8–27)
CHLORIDE: 104 mmol/L (ref 96–106)
CO2: 26 mmol/L (ref 20–29)
Calcium: 9 mg/dL (ref 8.7–10.3)
Creatinine, Ser: 0.75 mg/dL (ref 0.57–1.00)
GFR, EST AFRICAN AMERICAN: 93 mL/min/{1.73_m2} (ref >59)
GFR, EST NON AFRICAN AMERICAN: 80 mL/min/{1.73_m2} (ref >59)
GLOBULIN, TOTAL: 2.7 g/dL (ref 1.5–4.5)
Glucose: 104 mg/dL — ABNORMAL HIGH (ref 65–99)
POTASSIUM: 5 mmol/L (ref 3.5–5.2)
Sodium: 143 mmol/L (ref 134–144)
TOTAL PROTEIN: 6.8 g/dL (ref 6.0–8.5)

## 2017-12-22 LAB — CBC WITH DIFFERENTIAL/PLATELET
Basophils Absolute: 0 10*3/uL (ref 0.0–0.2)
Basos: 0 %
EOS (ABSOLUTE): 0.2 10*3/uL (ref 0.0–0.4)
EOS: 3 %
HEMATOCRIT: 34.5 % (ref 34.0–46.6)
Hemoglobin: 11.8 g/dL (ref 11.1–15.9)
IMMATURE GRANS (ABS): 0 10*3/uL (ref 0.0–0.1)
IMMATURE GRANULOCYTES: 0 %
LYMPHS: 33 %
Lymphocytes Absolute: 2.4 10*3/uL (ref 0.7–3.1)
MCH: 33 pg (ref 26.6–33.0)
MCHC: 34.2 g/dL (ref 31.5–35.7)
MCV: 96 fL (ref 79–97)
MONOCYTES: 9 %
MONOS ABS: 0.6 10*3/uL (ref 0.1–0.9)
NEUTROS PCT: 55 %
Neutrophils Absolute: 4 10*3/uL (ref 1.4–7.0)
Platelets: 239 10*3/uL (ref 150–379)
RBC: 3.58 x10E6/uL — AB (ref 3.77–5.28)
RDW: 14.1 % (ref 12.3–15.4)
WBC: 7.4 10*3/uL (ref 3.4–10.8)

## 2017-12-22 LAB — LIPID PANEL
CHOL/HDL RATIO: 3.4 ratio (ref 0.0–4.4)
Cholesterol, Total: 137 mg/dL (ref 100–199)
HDL: 40 mg/dL (ref >39)
LDL Calculated: 56 mg/dL (ref 0–99)
TRIGLYCERIDES: 205 mg/dL — AB (ref 0–149)
VLDL Cholesterol Cal: 41 mg/dL — ABNORMAL HIGH (ref 5–40)

## 2017-12-22 LAB — HEMOGLOBIN A1C
Est. average glucose Bld gHb Est-mCnc: 117 mg/dL
Hgb A1c MFr Bld: 5.7 % — ABNORMAL HIGH (ref 4.8–5.6)

## 2017-12-22 LAB — TSH: TSH: 6.07 u[IU]/mL — ABNORMAL HIGH (ref 0.450–4.500)

## 2017-12-23 LAB — SPECIMEN STATUS REPORT

## 2017-12-23 LAB — T4, FREE: FREE T4: 0.86 ng/dL (ref 0.82–1.77)

## 2017-12-24 ENCOUNTER — Ambulatory Visit
Admission: RE | Admit: 2017-12-24 | Discharge: 2017-12-24 | Disposition: A | Discharge status: Home / Self Care | Payer: Medicare Other | Source: Ambulatory Visit | Attending: Family Medicine | Admitting: Family Medicine

## 2017-12-24 DIAGNOSIS — E042 Nontoxic multinodular goiter: Secondary | ICD-10-CM

## 2017-12-24 DIAGNOSIS — E041 Nontoxic single thyroid nodule: Secondary | ICD-10-CM | POA: Diagnosis not present

## 2017-12-29 ENCOUNTER — Encounter: Payer: Self-pay | Admitting: Family Medicine

## 2017-12-30 ENCOUNTER — Other Ambulatory Visit: Payer: Self-pay | Admitting: *Deleted

## 2017-12-30 DIAGNOSIS — E039 Hypothyroidism, unspecified: Secondary | ICD-10-CM

## 2018-01-08 ENCOUNTER — Encounter: Payer: Self-pay | Admitting: Family Medicine

## 2018-01-08 DIAGNOSIS — R350 Frequency of micturition: Secondary | ICD-10-CM | POA: Diagnosis not present

## 2018-01-08 DIAGNOSIS — N3020 Other chronic cystitis without hematuria: Secondary | ICD-10-CM | POA: Diagnosis not present

## 2018-01-21 DIAGNOSIS — Z961 Presence of intraocular lens: Secondary | ICD-10-CM | POA: Diagnosis not present

## 2018-01-21 DIAGNOSIS — H26491 Other secondary cataract, right eye: Secondary | ICD-10-CM | POA: Diagnosis not present

## 2018-01-27 ENCOUNTER — Ambulatory Visit
Admission: RE | Admit: 2018-01-27 | Discharge: 2018-01-27 | Disposition: A | Discharge status: Home / Self Care | Payer: Medicare Other | Source: Ambulatory Visit | Attending: Family Medicine | Admitting: Family Medicine

## 2018-01-27 DIAGNOSIS — M810 Age-related osteoporosis without current pathological fracture: Secondary | ICD-10-CM

## 2018-01-27 DIAGNOSIS — Z780 Asymptomatic menopausal state: Secondary | ICD-10-CM | POA: Diagnosis not present

## 2018-01-27 DIAGNOSIS — M8589 Other specified disorders of bone density and structure, multiple sites: Secondary | ICD-10-CM | POA: Diagnosis not present

## 2018-02-17 DIAGNOSIS — H26491 Other secondary cataract, right eye: Secondary | ICD-10-CM | POA: Diagnosis not present

## 2018-02-19 DIAGNOSIS — N8110 Cystocele, unspecified: Secondary | ICD-10-CM | POA: Diagnosis not present

## 2018-02-19 DIAGNOSIS — N3020 Other chronic cystitis without hematuria: Secondary | ICD-10-CM | POA: Diagnosis not present

## 2018-03-22 ENCOUNTER — Other Ambulatory Visit: Payer: Self-pay | Admitting: Family Medicine

## 2018-03-24 ENCOUNTER — Encounter: Payer: Self-pay | Admitting: Family Medicine

## 2018-03-24 ENCOUNTER — Other Ambulatory Visit: Payer: Medicare Other

## 2018-03-24 DIAGNOSIS — N342 Other urethritis: Secondary | ICD-10-CM | POA: Diagnosis not present

## 2018-03-24 DIAGNOSIS — N8111 Cystocele, midline: Secondary | ICD-10-CM | POA: Diagnosis not present

## 2018-03-24 DIAGNOSIS — E039 Hypothyroidism, unspecified: Secondary | ICD-10-CM

## 2018-03-24 DIAGNOSIS — N952 Postmenopausal atrophic vaginitis: Secondary | ICD-10-CM | POA: Diagnosis not present

## 2018-03-24 DIAGNOSIS — R309 Painful micturition, unspecified: Secondary | ICD-10-CM | POA: Diagnosis not present

## 2018-03-25 LAB — TSH: TSH: 4.06 u[IU]/mL (ref 0.450–4.500)

## 2018-05-10 DIAGNOSIS — M1712 Unilateral primary osteoarthritis, left knee: Secondary | ICD-10-CM | POA: Diagnosis not present

## 2018-06-21 ENCOUNTER — Encounter: Payer: Self-pay | Admitting: Family Medicine

## 2018-06-21 DIAGNOSIS — M1712 Unilateral primary osteoarthritis, left knee: Secondary | ICD-10-CM | POA: Diagnosis not present

## 2018-06-22 NOTE — Progress Notes (Signed)
Chief Complaint  Patient presents with  . Hypertension    fasting med check, no new concerns.    She lost weight with [employer] over the last couple of months. She is now at her goal weight (and a lifetime member now). She cut out her 10am granola bar, snacks in the afternoon (snacks/crackers), eating more fruit instead, and more vegetables.  Hypertension follow-up: Blood pressures are sporadically checked at home 110-125/70's. Denies dizziness, chest pain. Denies side effects of medications. Denies headaches. She denies exertional chest pain. She gets intermittent mild vertigo (when sitting up in bed, sitting up from getting hair washed, very short-lived)--short lived (seconds), not worsening or increasing in frequency. She getsslight swelling in her ankles if she is on her feet a lot, by the end of the day, never present when she first wakes up.  GERD: Hardly ever needs to take the Nexium OTC, keeps it at home just in case (previously needed 1-2x/month).  Denies any dysphagia.   Hyperlipidemia follow-up: Patient is reportedly following a low-fat, low cholesterol diet. Compliant with medications and denies medication side effects. TG were mildly elevated on last check.  She is fasting for recheck today. Lab Results  Component Value Date   CHOL 137 12/21/2017   HDL 40 12/21/2017   LDLCALC 56 12/21/2017   TRIG 205 (H) 12/21/2017   CHOLHDL 3.4 12/21/2017   Impaired fasting glucose.  In May, glu 104, A1c 5.7 (had been up to 6.1 in 09/2017).  Diet has improved, as per above, though a little less exercise (see below).  H/o DVT--she completed 6 months of xarelto.She was discharged from the care of hematologist. If any recurrence, will need lifelong treatment.  She is taking '81mg'$  of ASA daily. No other blood thinning agents were recommended.She took xarelto after knee replacement surgery, and didn't have any problems.  Denies any bleeding/bruising.  (Not a candidate for Evista for  osteoporosis treatment given her DVT. We discussed risks of estrogen at last visit--using topical cream sparingly.)  She saw Dr. Alvan Dame yesterday, continues to do her stretches and home exercises. Only minimal discomfort at the left lateral lower knee, which he felt was related to a tendon, and should improve with continued stretching. She doesn't need to see them back again until she is 73 yo (unless having problems).  Constipation--doing well, on stool softener (no longer needs daily), high fiber diet.  MNG--denies symptoms/dysphagia.She reports that every once in a while she gets a short-lived discomfort on the left side of her neck, lasts a few minutes.  It is not related to a particular activity. No radiation of pain, no numbness, tingling, weakness. She mentioned this at her May visit, no change.  At her last visit in May, she noted a developing/worsening asymmetry to her neck. Last [place] had been 03/2012, so another was ordered.  [place] 12/2017 showed Right mid thyroid nodule, recommended yearly surveillance (x5 yrs). TSH increased to 6.07 in May.  She was asymptomatic, and recheck was done in August. She denies changes to hair/skin/bowels/energy/weight. Lab Results  Component Value Date   TSH 4.060 03/24/2018   Carotid atherosclerosis. Patient denies any neurologic symptoms. Dr. Einar Gip recommended not needing again unless had bruit or symptoms, last carotid dopplers 04/2016.  DOE: She had normal spirometry, normal CXR. She was referred to Dr. Einar Gip, who did nuclear stress test in August 2017. Stress EKG was+ for myocardial ischemia (66m downsloping ST seg depression, with sx of SOB). Perfusion scan--no definite ischemia (breast attenuation noted), considered  alow risk scan.Echo showed mild-mod tricuspid regurg.Shelast saw him in December, 2017. He felt that her DOEwas likely related to mild underlying COPD. He will see her again in 2 years, sooner if needed. She joined [employer]  (silver sneakers class, and water aerobics), and she really didn't notice any shortness of breath when exercising there.  She goes 2x/week, though none in the last couple of weeks due to some knee pain. Hasn't been walking as much recently (colder weather).  Does some DVD's at home.  She has chronic microscopic hematuria, under the care ofDr. McDiarmid. She takesnitrofurantoin once daily for UTI prevention (switched from Trimethoprim after getting break-through infections, in Dec/January), and hasn't had any infections since the switch. Got second Macroplastique procedure in September 2016, and ultimately needed periurethral biopsy for abnormal finding on CT. 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 She had good results from macroplastique.Urinary incontinence is overall pretty well controlled. Notices is when she wakes up in the morning and stands up--leaks prior to getting to the bathroom. No problems during the day.  Osteoporosis: She was started on Boniva in May 2017, after her DEXA done 12/10/15 showed T -2.5 at R fem neck. She is tolerating this without side effects. Denies dysphagia or chest pain. Takes Ca+D once daily. She had DEXA done 01/2018, with T-2.3 at R fem neck (was -2.5 in 2017).  PMH, PSH, SH reviewed  Outpatient Encounter Medications as of 06/23/2018  Medication Sig Note  . Calcium Citrate-Vitamin D (CALCIUM + D PO) Take 1 tablet by mouth daily.   [name] docusate sodium (COLACE) 100 MG capsule Take 1 capsule (100 mg total) by mouth 2 (two) times daily.   [name] esomeprazole (NEXIUM) 20 MG capsule Take 20 mg by mouth daily as needed (acid reflux). 12/21/2017: Uses prn, infrequently  . ibandronate (BONIVA) 150 MG tablet TAKE ONE TABLET ONCE A MONTH IN THE MORNING ON AN EMPTY STOMACH WITH WATER. REMAIN UPRIGHT.   [name] lisinopril-hydrochlorothiazide (PRINZIDE,ZESTORETIC) 20-12.5 MG tablet Take 1 tablet by mouth daily.   . Multiple Vitamins-Minerals (MULTIVITAMIN WITH  MINERALS) tablet Take 1 tablet by mouth daily.   . simvastatin (ZOCOR) 40 MG tablet Take 1 tablet (40 mg total) by mouth at bedtime.   . vitamin E 100 UNIT capsule Take 100 Units by mouth daily.   . [DISCONTINUED] lisinopril-hydrochlorothiazide (PRINZIDE,ZESTORETIC) 20-12.5 MG tablet Take 1 tablet by mouth daily.   . [DISCONTINUED] simvastatin (ZOCOR) 40 MG tablet Take 1 tablet (40 mg total) by mouth at bedtime.   . Ibuprofen-diphenhydrAMINE Cit (ADVIL PM PO) Take 2 tablets by mouth at bedtime.   . nitrofurantoin (MACRODANTIN) 100 MG capsule Take 100 mg by mouth daily.   . polyethylene glycol (MIRALAX / GLYCOLAX) packet Take 17 g by mouth 2 (two) times daily. (Patient not taking: Reported on 12/21/2017)    No facility-administered encounter medications on file as of 06/23/2018.    Allergies  Allergen Reactions  . Naprosyn [Naproxen] Rash    ROS:  No fever, chills, URI symptoms, headaches, dizziness, chest pain, shortness of breath, edema.  No nausea, vomiting, heartburn, bowel changes (see HPI for details).  No numbess, tingling, weakness or other concerns except per HPI--left sided intermittent neck pain, left knee, improved   PHYSICAL EXAM:  BP 124/70   Pulse 60   Ht '5\' 4"'$  (1.626 m)   Wt 125 lb 3.2 oz (56.8 kg)   LMP  (LMP Unknown)   BMI 21.49 kg/m   Wt Readings from Last 3 Encounters:  06/23/18 125 lb 3.2 oz (56.8 kg)  12/21/17 137 lb (62.1 kg)  09/21/17 137 lb (62.1 kg)   Well developed, pleasant female in no distress HEENT: PERRL, EOMI, conjunctiva clear, OP clear Neck: no lymphadenopathy or carotid bruit. Thyroid is borderline in size, no obvious nodule or asymmetry noted.  She has a focal area of muscular tightness at the left lateral neck muscles, tender to palpation, and some discomfort with stretches. Heart: regular rate and rhythm, no murmur Lungs: clear bilaterally, good air movement. Back: no spinal or CVA tenderness Abdomen: soft, nontender, no organomegaly or  mass Extremities: no edema, 2+ pulses Neuro: alert and oriented. Cranial nerves intact, normal strength, gait Psych: normal mood, affect, hygiene and grooming Skin: no visible bruising or rashes   Lab Results  Component Value Date   HGBA1C 5.7 (A) 06/23/2018    ASSESSMENT/PLAN:  Pure hypercholesterolemia - TG elevated on last check; diet has improved. Recheck today. Cont simvastatin - Plan: Lipid panel, simvastatin (ZOCOR) 40 MG tablet  Multinodular goiter - nodule on R will need [place] f/u in May--order at next visit  Impaired fasting glucose - Plan: HgB A1c, Comprehensive metabolic panel  OAB (overactive bladder) - stable (leaks in morning only)  Osteoporosis without current pathological fracture, unspecified osteoporosis type - continue Boniva, Ca, D, weight-bearing exercise  Need for influenza vaccination - Plan: Flu vaccine HIGH DOSE PF (Fluzone High dose)  Medication monitoring encounter - Plan: Lipid panel, Comprehensive metabolic panel  Essential hypertension, benign - controlled - Plan: lisinopril-hydrochlorothiazide (PRINZIDE,ZESTORETIC) 20-12.5 MG tablet  Pure hypercholesterolemia - Plan: Lipid panel, simvastatin (ZOCOR) 40 MG tablet  Neck pain on left side - muscular; shown stretches. Rec heat, stretches, massage, discussed posture  c-met, lipids.  Elected to hold off TSH until May; will contact us sooner if any symptoms.  ENTER FUTURE ORDERS for MAY  Remind to get Shingrix from pharm  Thyroid US f/u due again 12/2018 Dr. Einar Gip f/u should be due 07/2018 (scheduled??)

## 2018-06-23 ENCOUNTER — Ambulatory Visit (INDEPENDENT_AMBULATORY_CARE_PROVIDER_SITE_OTHER): Payer: Medicare Other | Admitting: Family Medicine

## 2018-06-23 ENCOUNTER — Encounter: Payer: Self-pay | Admitting: Family Medicine

## 2018-06-23 VITALS — BP 124/70 | HR 60 | Ht 64.0 in | Wt 125.2 lb

## 2018-06-23 DIAGNOSIS — M542 Cervicalgia: Secondary | ICD-10-CM

## 2018-06-23 DIAGNOSIS — E7800 Pure hypercholesterolemia, unspecified: Secondary | ICD-10-CM

## 2018-06-23 DIAGNOSIS — R7301 Impaired fasting glucose: Secondary | ICD-10-CM | POA: Diagnosis not present

## 2018-06-23 DIAGNOSIS — Z23 Encounter for immunization: Secondary | ICD-10-CM | POA: Diagnosis not present

## 2018-06-23 DIAGNOSIS — Z5181 Encounter for therapeutic drug level monitoring: Secondary | ICD-10-CM

## 2018-06-23 DIAGNOSIS — N3281 Overactive bladder: Secondary | ICD-10-CM

## 2018-06-23 DIAGNOSIS — M810 Age-related osteoporosis without current pathological fracture: Secondary | ICD-10-CM | POA: Diagnosis not present

## 2018-06-23 DIAGNOSIS — E042 Nontoxic multinodular goiter: Secondary | ICD-10-CM

## 2018-06-23 DIAGNOSIS — I10 Essential (primary) hypertension: Secondary | ICD-10-CM

## 2018-06-23 LAB — POCT GLYCOSYLATED HEMOGLOBIN (HGB A1C): Hemoglobin A1C: 5.7 % — AB (ref 4.0–5.6)

## 2018-06-23 MED ORDER — SIMVASTATIN 40 MG PO TABS: 40.0000 mg | ORAL_TABLET | Freq: Every day | ORAL | 1 refills | Status: DC

## 2018-06-23 MED ORDER — LISINOPRIL-HYDROCHLOROTHIAZIDE 20-12.5 MG PO TABS: 1.0000 | ORAL_TABLET | Freq: Every day | ORAL | 1 refills | Status: DC

## 2018-06-23 NOTE — Patient Instructions (Signed)
Per records, it appears that you are due for your two-year follow up with Dr. Einar Gip next month.  Remember to get the Shingrix vaccine at the pharmacy as we discussed.  Thyroid repeat ultrasound will be due in May, 2020, we will order at your next visit.

## 2018-06-24 LAB — COMPREHENSIVE METABOLIC PANEL
ALBUMIN: 4.3 g/dL (ref 3.5–4.8)
ALT: 25 IU/L (ref 0–32)
AST: 30 IU/L (ref 0–40)
Albumin/Globulin Ratio: 1.6 (ref 1.2–2.2)
Alkaline Phosphatase: 48 IU/L (ref 39–117)
BUN / CREAT RATIO: 22 (ref 12–28)
BUN: 19 mg/dL (ref 8–27)
Bilirubin Total: 0.4 mg/dL (ref 0.0–1.2)
CO2: 27 mmol/L (ref 20–29)
CREATININE: 0.88 mg/dL (ref 0.57–1.00)
Calcium: 9.4 mg/dL (ref 8.7–10.3)
Chloride: 102 mmol/L (ref 96–106)
GFR, EST AFRICAN AMERICAN: 76 mL/min/{1.73_m2} (ref >59)
GFR, EST NON AFRICAN AMERICAN: 66 mL/min/{1.73_m2} (ref >59)
GLOBULIN, TOTAL: 2.7 g/dL (ref 1.5–4.5)
Glucose: 82 mg/dL (ref 65–99)
Potassium: 4.5 mmol/L (ref 3.5–5.2)
SODIUM: 143 mmol/L (ref 134–144)
TOTAL PROTEIN: 7 g/dL (ref 6.0–8.5)

## 2018-06-24 LAB — LIPID PANEL
CHOLESTEROL TOTAL: 124 mg/dL (ref 100–199)
Chol/HDL Ratio: 2.6 ratio (ref 0.0–4.4)
HDL: 48 mg/dL (ref >39)
LDL CALC: 46 mg/dL (ref 0–99)
TRIGLYCERIDES: 148 mg/dL (ref 0–149)
VLDL Cholesterol Cal: 30 mg/dL (ref 5–40)

## 2018-07-26 DIAGNOSIS — N3020 Other chronic cystitis without hematuria: Secondary | ICD-10-CM | POA: Diagnosis not present

## 2018-07-26 DIAGNOSIS — N8111 Cystocele, midline: Secondary | ICD-10-CM | POA: Diagnosis not present

## 2018-07-26 DIAGNOSIS — R300 Dysuria: Secondary | ICD-10-CM | POA: Diagnosis not present

## 2018-09-14 ENCOUNTER — Other Ambulatory Visit: Payer: Self-pay | Admitting: Family Medicine

## 2018-10-21 ENCOUNTER — Encounter: Payer: Self-pay | Admitting: Family Medicine

## 2018-11-22 ENCOUNTER — Encounter: Payer: Self-pay | Admitting: Family Medicine

## 2018-11-30 ENCOUNTER — Other Ambulatory Visit: Payer: Self-pay | Admitting: Family Medicine

## 2018-11-30 DIAGNOSIS — Z1231 Encounter for screening mammogram for malignant neoplasm of breast: Secondary | ICD-10-CM

## 2019-01-10 ENCOUNTER — Other Ambulatory Visit: Payer: Medicare Other

## 2019-01-10 ENCOUNTER — Other Ambulatory Visit: Payer: Self-pay

## 2019-01-10 DIAGNOSIS — Z5181 Encounter for therapeutic drug level monitoring: Secondary | ICD-10-CM

## 2019-01-10 DIAGNOSIS — E7800 Pure hypercholesterolemia, unspecified: Secondary | ICD-10-CM | POA: Diagnosis not present

## 2019-01-10 DIAGNOSIS — I10 Essential (primary) hypertension: Secondary | ICD-10-CM

## 2019-01-10 DIAGNOSIS — R7301 Impaired fasting glucose: Secondary | ICD-10-CM | POA: Diagnosis not present

## 2019-01-10 DIAGNOSIS — E042 Nontoxic multinodular goiter: Secondary | ICD-10-CM

## 2019-01-10 LAB — LIPID PANEL

## 2019-01-11 LAB — LIPID PANEL
Chol/HDL Ratio: 2.5 ratio (ref 0.0–4.4)
Cholesterol, Total: 142 mg/dL (ref 100–199)
HDL: 56 mg/dL (ref >39)
LDL Calculated: 56 mg/dL (ref 0–99)
Triglycerides: 150 mg/dL — ABNORMAL HIGH (ref 0–149)
VLDL Cholesterol Cal: 30 mg/dL (ref 5–40)

## 2019-01-11 LAB — CBC WITH DIFFERENTIAL/PLATELET
Basophils Absolute: 0 10*3/uL (ref 0.0–0.2)
Basos: 1 %
EOS (ABSOLUTE): 0.2 10*3/uL (ref 0.0–0.4)
Eos: 3 %
Hematocrit: 36.3 % (ref 34.0–46.6)
Hemoglobin: 12.5 g/dL (ref 11.1–15.9)
Immature Grans (Abs): 0 10*3/uL (ref 0.0–0.1)
Immature Granulocytes: 0 %
Lymphocytes Absolute: 2.7 10*3/uL (ref 0.7–3.1)
Lymphs: 44 %
MCH: 34.1 pg — ABNORMAL HIGH (ref 26.6–33.0)
MCHC: 34.4 g/dL (ref 31.5–35.7)
MCV: 99 fL — ABNORMAL HIGH (ref 79–97)
Monocytes Absolute: 0.5 10*3/uL (ref 0.1–0.9)
Monocytes: 8 %
Neutrophils Absolute: 2.7 10*3/uL (ref 1.4–7.0)
Neutrophils: 44 %
Platelets: 232 10*3/uL (ref 150–450)
RBC: 3.67 x10E6/uL — ABNORMAL LOW (ref 3.77–5.28)
RDW: 13.4 % (ref 11.7–15.4)
WBC: 6.2 10*3/uL (ref 3.4–10.8)

## 2019-01-11 LAB — HEMOGLOBIN A1C
Est. average glucose Bld gHb Est-mCnc: 117 mg/dL
Hgb A1c MFr Bld: 5.7 % — ABNORMAL HIGH (ref 4.8–5.6)

## 2019-01-11 LAB — COMPREHENSIVE METABOLIC PANEL
ALT: 25 IU/L (ref 0–32)
AST: 26 IU/L (ref 0–40)
Albumin/Globulin Ratio: 1.7 (ref 1.2–2.2)
Albumin: 4.5 g/dL (ref 3.7–4.7)
Alkaline Phosphatase: 47 IU/L (ref 39–117)
BUN/Creatinine Ratio: 25 (ref 12–28)
BUN: 20 mg/dL (ref 8–27)
Bilirubin Total: 0.6 mg/dL (ref 0.0–1.2)
CO2: 25 mmol/L (ref 20–29)
Calcium: 9.7 mg/dL (ref 8.7–10.3)
Chloride: 100 mmol/L (ref 96–106)
Creatinine, Ser: 0.81 mg/dL (ref 0.57–1.00)
GFR calc Af Amer: 84 mL/min/{1.73_m2} (ref >59)
GFR calc non Af Amer: 73 mL/min/{1.73_m2} (ref >59)
Globulin, Total: 2.7 g/dL (ref 1.5–4.5)
Glucose: 102 mg/dL — ABNORMAL HIGH (ref 65–99)
Potassium: 5.1 mmol/L (ref 3.5–5.2)
Sodium: 140 mmol/L (ref 134–144)
Total Protein: 7.2 g/dL (ref 6.0–8.5)

## 2019-01-11 LAB — TSH: TSH: 6.2 u[IU]/mL — ABNORMAL HIGH (ref 0.450–4.500)

## 2019-01-12 NOTE — Patient Instructions (Addendum)
HEALTH MAINTENANCE RECOMMENDATIONS:  It is recommended that you get at least 30 minutes of aerobic exercise at least 5 days/week (for weight loss, you may need as much as 60-90 minutes). This can be any activity that gets your heart rate up. This can be divided in 10-15 minute intervals if needed, but try and build up your endurance at least once a week.  Weight bearing exercise is also recommended twice weekly.  Eat a healthy diet with lots of vegetables, fruits and fiber.  "Colorful" foods have a lot of vitamins (ie green vegetables, tomatoes, red peppers, etc).  Limit sweet tea, regular sodas and alcoholic beverages, all of which has a lot of calories and sugar.  Up to 1 alcoholic drink daily may be beneficial for women (unless trying to lose weight, watch sugars).  Drink a lot of water.  Calcium recommendations are 1200-1500 mg daily (1500 mg for postmenopausal women or women without ovaries), and vitamin D 1000 IU daily.  This should be obtained from diet and/or supplements (vitamins), and calcium should not be taken all at once, but in divided doses.  Monthly self breast exams and yearly mammograms for women over the age of 67 is recommended.  Sunscreen of at least SPF 30 should be used on all sun-exposed parts of the skin when outside between the hours of 10 am and 4 pm (not just when at beach or pool, but even with exercise, golf, tennis, and yard work!)  Use a sunscreen that says "broad spectrum" so it covers both UVA and UVB rays, and make sure to reapply every 1-2 hours.  Remember to change the batteries in your smoke detectors when changing your clock times in the spring and fall.  Use your seat belt every time you are in a car, and please drive safely and not be distracted with cell phones and texting while driving.    Ms. Boesen , Thank you for taking time to come for your Medicare Wellness Visit. I appreciate your ongoing commitment to your health goals. Please review the  following plan we discussed and let me know if I can assist you in the future.   This is a list of the screening recommended for you and due dates:  Health Maintenance  Topic Date Due   Flu Shot (high dose) Fall, 2020   Mammogram  12/16/2018   Colon Cancer Screening  06/20/2020   Tetanus Vaccine  10/31/2024   DEXA scan (bone density measurement)  01/2020    Hepatitis C: One time screening is recommended by Center for Disease Control  (CDC) for  adults born from [birth year] through 1965.   Completed   Pneumonia vaccines  Completed   We will be setting you up for another thyroid ultrasound (1 year follow-up on nodule).  I recommend getting the new shingles vaccine (Shingrix). You will need to get from the pharmacy rather than our office, as it is covered by Medicare Part D.  It is a series of 2 injections, spaced 2 months apart.  We are starting you on Synthroid for mildly underactive thyroid (hypothyroidism).  We will arrange for a lab visit in 6-8 weeks.  Let us know if you have problems with the medication.  Take it on an empty stomach, first thing in the morning, and wait to eat for at least 30 minutes.  Vitamins should be separated longer (consider taking those at lunch or in the evening.  If you need something for sleep, change from Advil PM to  plain benadryl (diphenhydramine) or tylenol PM (if there is any pain/discomfort).   Restart taking claritin daily since you are still having allergy symptoms and increased frequency of vertigo, which may be related.  Please contact Dr. Irven Shelling office, as you are due for a routine follow-up visit.    Hypothyroidism  Hypothyroidism is when the thyroid gland does not make enough of certain hormones (it is underactive). The thyroid gland is a small gland located in the lower front part of the neck, just in front of the windpipe (trachea). This gland makes hormones that help control how the body uses food for energy (metabolism) as well as how the  heart and brain function. These hormones also play a role in keeping your bones strong. When the thyroid is underactive, it produces too little of the hormones thyroxine (T4) and triiodothyronine (T3). What are the causes? This condition may be caused by:  Hashimoto's disease. This is a disease in which the body's disease-fighting system (immune system) attacks the thyroid gland. This is the most common cause.  Viral infections.  Pregnancy.  Certain medicines.  Birth defects.  Past radiation treatments to the head or neck for cancer.  Past treatment with radioactive iodine.  Past exposure to radiation in the environment.  Past surgical removal of part or all of the thyroid.  Problems with a gland in the center of the brain (pituitary gland).  Lack of enough iodine in the diet. What increases the risk? You are more likely to develop this condition if:  You are female.  You have a family history of thyroid conditions.  You use a medicine called lithium.  You take medicines that affect the immune system (immunosuppressants). What are the signs or symptoms? Symptoms of this condition include:  Feeling as though you have no energy (lethargy).  Not being able to tolerate cold.  Weight gain that is not explained by a change in diet or exercise habits.  Lack of appetite.  Dry skin.  Coarse hair.  Menstrual irregularity.  Slowing of thought processes.  Constipation.  Sadness or depression. How is this diagnosed? This condition may be diagnosed based on:  Your symptoms, your medical history, and a physical exam.  Blood tests. You may also have imaging tests, such as an ultrasound or MRI. How is this treated? This condition is treated with medicine that replaces the thyroid hormones that your body does not make. After you begin treatment, it may take several weeks for symptoms to go away. Follow these instructions at home:  Take over-the-counter and  prescription medicines only as told by your health care provider.  If you start taking any new medicines, tell your health care provider.  Keep all follow-up visits as told by your health care provider. This is important. ? As your condition improves, your dosage of thyroid hormone medicine may change. ? You will need to have blood tests regularly so that your health care provider can monitor your condition. Contact a health care provider if:  Your symptoms do not get better with treatment.  You are taking thyroid replacement medicine and you: ? Sweat a lot. ? Have tremors. ? Feel anxious. ? Lose weight rapidly. ? Cannot tolerate heat. ? Have emotional swings. ? Have diarrhea. ? Feel weak. Get help right away if you have:  Chest pain.  An irregular heartbeat.  A rapid heartbeat.  Difficulty breathing. Summary  Hypothyroidism is when the thyroid gland does not make enough of certain hormones (it is underactive).  When the thyroid is underactive, it produces too little of the hormones thyroxine (T4) and triiodothyronine (T3).  The most common cause is Hashimoto's disease, a disease in which the body's disease-fighting system (immune system) attacks the thyroid gland. The condition can also be caused by viral infections, medicine, pregnancy, or past radiation treatment to the head or neck.  Symptoms may include weight gain, dry skin, constipation, feeling as though you do not have energy, and not being able to tolerate cold.  This condition is treated with medicine to replace the thyroid hormones that your body does not make. This information is not intended to replace advice given to you by your health care provider. Make sure you discuss any questions you have with your health care provider. Document Released: 07/28/2005 Document Revised: 07/08/2017 Document Reviewed: 07/08/2017 Elsevier Interactive Patient Education  2019 [employer].

## 2019-01-12 NOTE — Progress Notes (Signed)
Start time: 8:47 End time: 9:55   Virtual Visit via Video Note  I connected with Dorothy Rojas on 01/13/2019 by a video enabled telemedicine application and verified that I am speaking with the correct person using two identifiers.  Location: Patient: at home, husband is upstairs Provider: office   I discussed the limitations of evaluation and management by telemedicine and the availability of in person appointments. The patient expressed understanding and agreed to proceed.  History of Present Illness:  Chief Complaint  Patient presents with  . Medicare Wellness    VIRTUAL AWV. No new concerns.    Patient presents for Medicare Annual Wellness Visit and routine follow-up on chronic problems (med check).  She had labs prior to her visit, see below.  Hypertension follow-up: Blood pressures are sporadically checked at home, only irregularly, cannot recall values other than this morning was 131/69.  Denies headaches or lightheadedness, syncope, chest pain. Denies side effects of medications. She denies exertional chest pain. Compliant with lisinopril HCT. She gets intermittent mild vertigo (when sitting up in bed, sitting up from getting hair washed, very short-lived)--short lived(seconds), not worsening or increasing in frequency or duration (slightly worse when allergies or flaring).   Recently had clicking in her ears (see MyChart note), started taking antihistamine and the clicking cleared. She then stopped taking the medication.  Sneezing and runny nose/sinuses in the afternoons.  Mucus is clear. Still has claritin at home.  GERD: Hasn't needed to take any Nexium OTC.  Only rarely has heartburn, related to something she ate.  Denies dysphagia.  Hyperlipidemia follow-up: Patient is reportedly following a low-fat, low cholesterol diet. Compliant with medications (simvastatin) and denies medication side effects.  See below for labs done prior to visit.   Impaired fasting  glucose.  In November her A1c was 5.7 (had been up to 6.1 in 09/2017).  She continues to avoid sugar and limit carbs in her diet.   H/o DVT--she is s/p 6 months of xarelto.She was discharged from the care of hematologist. If any recurrence, will need lifelong treatment.  She is taking 81mg  of ASA daily. No other blood thinning agents were recommended.She took xarelto after knee replacement surgery, and didn't have any problems.Denies any bleeding/bruising.  (Not a candidate for Evista for osteoporosis treatment given her DVT. We discussed risks of estrogen at last visit--using topical cream sparingly.)  Constipation--doing well, on high fiber diet. No longer regularly needing stool softeners or miralax.  MNG--denies symptoms/dysphagia.She reports that every once in a while she gets a short-lived discomfort on the left side of her neck, lasts a few minutes.  It is not related to a particular activity. No radiation of pain, no numbness, tingling, weakness. This is unchanged for years.  Isn't related to swallowing, unsure if muscular or not (near [facility]), short-lived (fluctuates in intensity before resolving in about 2 minutes), occurring less than once a week (3x/month).  [place] 12/2017 showed Right mid thyroid nodule, recommended yearly surveillance (x5 yrs). TSH increased to 6.07 in May.  She was asymptomatic, and recheck was done in August, 4.060. She has noticed her skin is drier (and not just on her hands from washing), has noticed being a little colder more recently (in the afternoons). No changes to energy, bowels, hair (slightly dry, no losses or thinning) or weight.  Carotid atherosclerosis. Patient denies any neurologic symptoms. Dr. Einar Gip recommended not needing again unless had bruit or symptoms, last carotid dopplers 04/2016.  DOE: She had normal spirometry, normal CXR. She  was referred to Dr. Einar Gip, who did nuclear stress test in August 2017. Stress EKG was+ for myocardial ischemia  (8mm downsloping ST seg depression, with sx of SOB). Perfusion scan--no definite ischemia (breast attenuation noted), considered alow risk scan.Echo showed mild-mod tricuspid regurg.Shelast saw him in December, 2017. He felt that her DOEwas likely related to mild underlying COPD. He recommended 2 year follow-up, none scheduled.  Current exercise includes walking most days with her husband, 30 minutes.  No weight-bearing exercise. Has some home exercise videos, hasn't been doing. Plans to resume [employer] and water aerobics when they open. She has not been having any DOE, but hasn't felt like she has exerted herself very much.  Previously mainly noted this when in summer, related to heat/humidity.  She has chronic microscopic hematuria, under the care ofDr. McDiarmid. She takesnitrofurantoinonce daily for UTI prevention(switched fromTrimethoprimafter getting break-through infections, in Dec/January), and hasn't had any infections sincethe switch. Got second Macroplastique procedure in September 2016, and ultimately needed periurethral biopsy for abnormal finding on CT. 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 She had good results from macroplastique.Urinary incontinence is overall pretty well controlled. Notices is when she wakes up in the morning and stands up--leaks prior to getting to the bathroom. No problems during the day, sometimes with laughing/coughing.  Osteoporosis: She was started on Boniva in May 2017, after her DEXA done 12/10/15 showed T -2.5 at R fem neck. She is tolerating this without side effects. Denies dysphagia or chest pain. Takes Ca+D once daily. She had DEXA done 01/2018, with T-2.3 at R fem neck (was -2.5 in December 03, 2015).  Immunization History  Administered Date(s) Administered  . Influenza Whole 04/09/2011  . Influenza, High Dose Seasonal PF 05/08/2014, 05/01/2015, 05/26/2016, 05/22/2017, 06/23/2018  . Influenza,inj,Quad PF,6+ Mos 04/14/2013   . Influenza-Unspecified 05/22/2017  . Pneumococcal Conjugate-13 11/01/2014  . Pneumococcal Polysaccharide-23 04/09/2011  . Td 08/07/2006  . Tdap 11/01/2014  . Zoster 10/25/2008    Last Pap smear: can't recall. S/p hysterectomy for benign reasons  Last mammogram: 12/2017, scheduled again for later this month. Last colonoscopy: 06/2015 (along with EGD) with Dr. Henrene Pastor. Had adenomatous polyp, due again in 03-Dec-2019 Last DEXA: 01/2018 T-2.3 at R femur neck (had been -2.5 in5/2017) Ophtho: yearly, scheduled for July Dentist: regularly, with dentist and periodontist every 3-4 months  Exercise: walking 30 minutes daily.   Other doctors caring for patient include: Dr. McDiarmid (urology)  Dr. Gershon Crane (ophtho)  Dr. Kathrine Cords) Dr. Conley Canal (dentist)  Dr. Juliann Mule (hematologist)--released from his care Dr. Alvan Dame (ortho-knee) Dr. Onnie Graham (ortho--shoulder) Dr. Essie Hart (periodontist) Dr. Wilhemina Bonito (dermatologist)   Depression screen: negative Fall Screen: None Functional Status screen: notable only for urinary incontinence, some trouble hearing in crowded rooms, sometimes with the TV. Mini-Cog screen: normal (clock drawing seen over video) See epic for full screens  End of Life Discussion: Patient hasa living will and medical power of attorney  PMH, PSH, SH and FH were updated and reviewed Brother died December 03, 2018, stage 4 bile cancer, liver cancer, metastatic to lungs and stomach.  Past Medical History:  Diagnosis Date  . Anemia   . Arthritis    knees  . Chronic constipation   . Colon polyp 06/2015   tubular adenoma (sigmoid)  . Diverticulosis    seen on colonoscopy 06/2015  . GERD (gastroesophageal reflux disease)    normal EGD 06/2015  . Heterozygous factor V Leiden mutation [facility]) dx 07/2013     hematologist--  dr Bernadene Bell ([facility])--  per note "Low Risk"  . History of DVT of lower extremity    dx 07-28-2013  left lower extrem.  --  xarelto for 6 months  .  Hypertension   . Lymphadenopathy, inguinal    bilateral  . Microscopic hematuria    chronic   . Multinodular goiter   . Multinodular thyroid    a. Path benign 2013.  [name] OAB (overactive bladder)   . Osteoporosis 2017   osteopenia since 2011; osteoporosis 12/2015  . Pre-diabetes    no meds, managaed with lifestyle changes   . Prediabetes   . SUI (stress urinary incontinence, female)   . Tricuspid regurgitation    mild-mod, noted on echo 03/2016    Past Surgical History:  Procedure Laterality Date  . APPENDECTOMY  1973  . CATARACT EXTRACTION W/ INTRAOCULAR LENS  IMPLANT, BILATERAL Bilateral 2015  . CYSTOCELE REPAIR  12-03-2010  . CYSTOSCOPY MACROPLASTIQUE IMPLANT N/A 12/12/2014   Procedure: CYSTOSCOPY MACROPLASTIQUE INJECTION;  Surgeon: Bjorn Loser, MD;  Location: [facility];  Service: Urology;  Laterality: N/A;  . CYSTOSCOPY MACROPLASTIQUE IMPLANT N/A 04/12/2015   Procedure: CYSTOSCOPY MACROPLASTIQUE IMPLANT;  Surgeon: Bjorn Loser, MD;  Location: [facility];  Service: Urology;  Laterality: N/A;  . INCONTINENCE SURGERY  04/1999   Re-do at [facility] for sling complications in [id number]  . KNEE ARTHROSCOPY Right 04-05-2013  . LATERAL EPICONDYLE RELEASE Right   . NEUROMA SURGERY Bilateral 1980's    feet  . OVARIAN CYST REMOVAL Left 1973  . PUBOVAGINAL SLING N/A 07/30/2015   Procedure: PELVIC EXAM UNDER ANESTHESIA, TRANSVAGINAL BIOPSY OF PERIURETHRAL MASS;  Surgeon: Raynelle Bring, MD;  Location: [facility];  Service: Urology;  Laterality: N/A;  31 MINS   . TONSILLECTOMY  as child  . TOTAL ABDOMINAL HYSTERECTOMY W/ BILATERAL SALPINGOOPHORECTOMY  09/  2000  . TOTAL KNEE ARTHROPLASTY Left 09/21/2017   Procedure: LEFT TOTAL KNEE ARTHROPLASTY;  Surgeon: Paralee Cancel, MD;  Location: [facility];  Service: Orthopedics;  Laterality: Left;  90 mins  . TRANSTHORACIC ECHOCARDIOGRAM  05-04-2014   normal ,  ef 60-65%    Social History   Socioeconomic History  . Marital  status: Married    Spouse name: Not on file  . Number of children: 3  . Years of education: Not on file  . Highest education level: Not on file  Occupational History  . Occupation: Retired    Fish farm manager: Database administrator  Social Needs  . Financial resource strain: Not on file  . Food insecurity:    Worry: Not on file    Inability: Not on file  . Transportation needs:    Medical: Not on file    Non-medical: Not on file  Tobacco Use  . Smoking status: Former Smoker    Packs/day: 1.00    Years: 21.00    Pack years: 21.00    Types: Cigarettes    Last attempt to quit: 08/12/1983    Years since quitting: 35.4  . Smokeless tobacco: Never Used  Substance and Sexual Activity  . Alcohol use: Yes    Alcohol/week: 0.0 standard drinks    Comment: 1 glass of wine most days of the week (4oz)  . Drug use: No  . Sexual activity: Not Currently  Lifestyle  . Physical activity:    Days per week: Not on file    Minutes per session: Not on file  . Stress: Not on file  Relationships  . Social connections:    Talks on phone: Not  on file    Gets together: Not on file    Attends religious service: Not on file    Active member of club or organization: Not on file    Attends meetings of clubs or organizations: Not on file    Relationship status: Not on file  . Intimate partner violence:    Fear of current or ex partner: Not on file    Emotionally abused: Not on file    Physically abused: Not on file    Forced sexual activity: Not on file  Other Topics Concern  . Not on file  Social History Narrative   Married.  Lives with husband.  Children live in [place], [place].  Youngest child was killed in [place].  3 stepchildren are all local. 5 grandchildren (2 in [place], 2 from step-daughter in [place], and another in [place]).    Family History  Problem Relation Age of Onset  . Hypertension Father   . Heart disease Father 43       CABG, died from MI at 76  . COPD Father   . Cirrhosis Mother        related to  psoriasis medications  . Diabetes Mother   . Psoriasis Mother   . Osteogenesis imperfecta Sister   . Heart disease Sister        CHF  . Diabetes Brother   . Heart disease Brother 46       MI  . Deep vein thrombosis Brother        factor V Leiden NEGATIVE  . Cancer Brother 73       bile duct and liver    Outpatient Encounter Medications as of 01/13/2019  Medication Sig Note  . Calcium Citrate-Vitamin D (CALCIUM + D PO) Take 1 tablet by mouth daily.   [name] ibandronate (BONIVA) 150 MG tablet TAKE ONE TABLET ONCE A MONTH IN THE MORNING ON AN EMPTY STOMACH WITH WATER. REMAIN UPRIGHT.   [name] lisinopril-hydrochlorothiazide (PRINZIDE,ZESTORETIC) 20-12.5 MG tablet Take 1 tablet by mouth daily.   . Multiple Vitamins-Minerals (MULTIVITAMIN WITH MINERALS) tablet Take 1 tablet by mouth daily.   . nitrofurantoin (MACRODANTIN) 100 MG capsule Take 100 mg by mouth daily.   . simvastatin (ZOCOR) 40 MG tablet Take 1 tablet (40 mg total) by mouth at bedtime.   . vitamin E 100 UNIT capsule Take 100 Units by mouth daily.   [name] docusate sodium (COLACE) 100 MG capsule Take 1 capsule (100 mg total) by mouth 2 (two) times daily. (Patient not taking: Reported on 01/13/2019)   . esomeprazole (NEXIUM) 20 MG capsule Take 20 mg by mouth daily as needed (acid reflux). 12/21/2017: Uses prn, infrequently  . Ibuprofen-diphenhydrAMINE Cit (ADVIL PM PO) Take 2 tablets by mouth at bedtime. 01/13/2019: Uses rarely, prn  . polyethylene glycol (MIRALAX / GLYCOLAX) packet Take 17 g by mouth 2 (two) times daily. (Patient not taking: Reported on 12/21/2017)    No facility-administered encounter medications on file as of 01/13/2019.     Allergies  Allergen Reactions  . Naprosyn [Naproxen] Rash    ROS:The patient denies anorexia, fever, headaches, vision changes, ear pain, sore throat, breast concerns, chest pain, palpitations, syncope, cough, swelling, nausea, vomiting, diarrhea, abdominal pain, melena, hematochezia, hematuria, dysuria,  vaginal bleeding, discharge, odor or itch, genital lesions, numbness, tingling, weakness, tremor, suspicious skin lesions, depression, anxiety, abnormal bleeding or enlarged lymph nodes.  Constipation--improved Intermittent vertigo, usually only whenshe sits up quickly from [place]. Urinary incontinence stable Some sneezing/allergies some afternoons,  not daily. Occasional vertigo (worse when allergies are flaring) Denies DOE, as reported in HPI. Some hearing loss/trouble, especially in crowds, restaurants, unchanged R hip and low back pain flared after drive back from [place] (lasted a couple of weeks). Currently denies any joint pains. No weight changes (got to goal with [facility] last year)  Observations/Objective:  BP 131/69   Pulse 70   Ht 5' 4.5" (1.638 m)   Wt 127 lb (57.6 kg)   LMP  (LMP Unknown)   BMI 21.46 kg/m   Wt Readings from Last 3 Encounters:  01/13/19 127 lb (57.6 kg)  06/23/18 125 lb 3.2 oz (56.8 kg)  12/21/17 137 lb (62.1 kg)    Well appearing, pleasant female in good spirits. She is alert, oriented, with grossly intact cranial nerves. Exam is limited due to the virtual nature of the visit.    Chemistry      Component Value Date/Time   NA 140 01/10/2019 0830   NA 144 01/26/2014 0809   K 5.1 01/10/2019 0830   K 4.6 01/26/2014 0809   CL 100 01/10/2019 0830   CO2 25 01/10/2019 0830   CO2 29 01/26/2014 0809   BUN 20 01/10/2019 0830   BUN 18.5 01/26/2014 0809   CREATININE 0.81 01/10/2019 0830   CREATININE 0.92 12/04/2016 0713   CREATININE 0.8 01/26/2014 0809      Component Value Date/Time   CALCIUM 9.7 01/10/2019 0830   CALCIUM 9.4 01/26/2014 0809   ALKPHOS 47 01/10/2019 0830   ALKPHOS 57 10/13/2013 0910   AST 26 01/10/2019 0830   AST 21 10/13/2013 0910   ALT 25 01/10/2019 0830   ALT 23 10/13/2013 0910   BILITOT 0.6 01/10/2019 0830   BILITOT 0.42 10/13/2013 0910     Fasting glucose 102  Lab Results  Component Value Date   HGBA1C 5.7  (H) 01/10/2019   Lab Results  Component Value Date   CHOL 142 01/10/2019   HDL 56 01/10/2019   LDLCALC 56 01/10/2019   TRIG 150 (H) 01/10/2019   CHOLHDL 2.5 01/10/2019   Lab Results  Component Value Date   WBC 6.2 01/10/2019   HGB 12.5 01/10/2019   HCT 36.3 01/10/2019   MCV 99 (H) 01/10/2019   PLT 232 01/10/2019   Lab Results  Component Value Date   TSH 6.200 (H) 01/10/2019    Assessment and Plan:  Medicare annual wellness visit, subsequent  Multinodular goiter - due for [place] recheck of nodule on the right, will arrange - Plan: US Soft Tissue Head/Neck  Pure hypercholesterolemia - cont current meds (refill not needed yet, per pt), cont low cholesterol diet  Impaired fasting glucose - mild; reviewed proper diet, exercise.  At goal weight  OAB (overactive bladder) - stable, under the care of urologist.  Also for frequent UTI's, on prophylactic ABX and doing well, without infections  Osteoporosis without current pathological fracture, unspecified osteoporosis type - Continue Boniva, Ca, D. Add in weight-bearing exercise. Recheck DEXA next year  Essential hypertension, benign - controlled; monitor a little more regularly to ensure good values, then once a month fine. Cont current meds  Hypothyroidism, unspecified type - mild sx including feeling cold, dry skin/hair.  Start Synthroid 59mcg. Discussed how to properly take. Recheck TSH in 6-8 wks - Plan: SYNTHROID 25 MCG tablet, TSH  Heterozygous factor V Leiden mutation (HCC) - cont ASA 81mg , enteric-coated. Discussed potential adverse effects, s/sx, to contact us if they develop  History of DVT (deep vein thrombosis)  Seasonal  allergic rhinitis, unspecified trigger - recommended daily Claritin   Thyroid US due to f/u nodule (due now) Past due to f/u with Dr. Deliah Boston rec from pharmacy  Restart claritin Start Synthroid 58mcg TSH in 6-8 weeks  Prefer benadryl or tylenol PM over Advil PM  Has plenty of meds, no  refills needed yet. (probably August)  Discussed monthly self breast exams and yearly mammograms; at least 30 minutes of aerobic activity at least 5 days/week and weight-bearing exercise 2x/week; proper sunscreen use reviewed; healthy diet, including goals of calcium and vitamin D intake and alcohol recommendations (less than or equal to 1 drink/day) reviewed; regular seatbelt use; changing batteries in smoke detectors. Immunization recommendations discussed--continue yearly flu shots. Shingrix recommended, risks/side effects discussed, to get from pharmacy.Colonoscopy recommendations reviewed, due in 2021. DEXA due again 01/2020  MOST form reviewed, unchanged (unable to sign due to virtual nature of the visit). Full Code, Full Care  F/u 6 mos for med check.  Follow Up Instructions:    I discussed the assessment and treatment plan with the patient. The patient was provided an opportunity to ask questions and all were answered. The patient agreed with the plan and demonstrated an understanding of the instructions.   The patient was advised to call back or seek an in-person evaluation if the symptoms worsen or if the condition fails to improve as anticipated.  I provided 68 minutes of non-face-to-face time during this encounter.   [name], MD   Medicare Attestation I have personally reviewed: The patient's medical and social history Their use of alcohol, tobacco or illicit drugs Their current medications and supplements The patient's functional ability including ADLs,fall risks, home safety risks, cognitive, and hearing and visual impairment Diet and physical activities Evidence for depression or mood disorders  The patient's weight, height and BMI have been recorded in the chart.  I have made referrals, counseling, and provided education to the patient based on review of the above and I have provided the patient with a written personalized care plan for preventive services.

## 2019-01-13 ENCOUNTER — Ambulatory Visit (INDEPENDENT_AMBULATORY_CARE_PROVIDER_SITE_OTHER): Payer: Medicare Other | Admitting: Family Medicine

## 2019-01-13 ENCOUNTER — Encounter: Payer: Self-pay | Admitting: Family Medicine

## 2019-01-13 ENCOUNTER — Other Ambulatory Visit: Payer: Self-pay

## 2019-01-13 VITALS — BP 131/69 | HR 70 | Ht 64.5 in | Wt 127.0 lb

## 2019-01-13 DIAGNOSIS — I10 Essential (primary) hypertension: Secondary | ICD-10-CM

## 2019-01-13 DIAGNOSIS — E042 Nontoxic multinodular goiter: Secondary | ICD-10-CM | POA: Diagnosis not present

## 2019-01-13 DIAGNOSIS — E039 Hypothyroidism, unspecified: Secondary | ICD-10-CM

## 2019-01-13 DIAGNOSIS — Z86718 Personal history of other venous thrombosis and embolism: Secondary | ICD-10-CM

## 2019-01-13 DIAGNOSIS — J302 Other seasonal allergic rhinitis: Secondary | ICD-10-CM | POA: Diagnosis not present

## 2019-01-13 DIAGNOSIS — N3281 Overactive bladder: Secondary | ICD-10-CM

## 2019-01-13 DIAGNOSIS — R7301 Impaired fasting glucose: Secondary | ICD-10-CM

## 2019-01-13 DIAGNOSIS — Z0000 Encounter for general adult medical examination without abnormal findings: Secondary | ICD-10-CM

## 2019-01-13 DIAGNOSIS — E7800 Pure hypercholesterolemia, unspecified: Secondary | ICD-10-CM

## 2019-01-13 DIAGNOSIS — M810 Age-related osteoporosis without current pathological fracture: Secondary | ICD-10-CM

## 2019-01-13 DIAGNOSIS — D6851 Activated protein C resistance: Secondary | ICD-10-CM

## 2019-01-13 MED ORDER — SYNTHROID 25 MCG PO TABS: 25.0000 ug | ORAL_TABLET | Freq: Every day | ORAL | 2 refills | Status: DC

## 2019-01-17 ENCOUNTER — Encounter: Payer: Self-pay | Admitting: Family Medicine

## 2019-01-17 ENCOUNTER — Ambulatory Visit
Admission: RE | Admit: 2019-01-17 | Discharge: 2019-01-17 | Disposition: A | Discharge status: Home / Self Care | Payer: Medicare Other | Source: Ambulatory Visit | Attending: Family Medicine | Admitting: Family Medicine

## 2019-01-17 DIAGNOSIS — E042 Nontoxic multinodular goiter: Secondary | ICD-10-CM

## 2019-01-17 DIAGNOSIS — E041 Nontoxic single thyroid nodule: Secondary | ICD-10-CM | POA: Diagnosis not present

## 2019-01-18 NOTE — Telephone Encounter (Signed)
I put in the order for thyroid biopsy for [facility]. Please schedule and contact the patient.  Thank you.

## 2019-01-19 ENCOUNTER — Encounter: Payer: Self-pay | Admitting: Family Medicine

## 2019-01-23 ENCOUNTER — Telehealth: Payer: Self-pay | Admitting: Family Medicine

## 2019-01-23 NOTE — Telephone Encounter (Signed)
P.A. Otho Darner

## 2019-01-24 ENCOUNTER — Other Ambulatory Visit: Payer: Self-pay | Admitting: Family Medicine

## 2019-01-24 DIAGNOSIS — E042 Nontoxic multinodular goiter: Secondary | ICD-10-CM

## 2019-01-25 ENCOUNTER — Ambulatory Visit
Admission: RE | Admit: 2019-01-25 | Discharge: 2019-01-25 | Disposition: A | Discharge status: Home / Self Care | Payer: Medicare Other | Source: Ambulatory Visit | Attending: Family Medicine | Admitting: Family Medicine

## 2019-01-25 ENCOUNTER — Other Ambulatory Visit (HOSPITAL_COMMUNITY)
Admission: RE | Admit: 2019-01-25 | Discharge: 2019-01-25 | Disposition: A | Discharge status: Home / Self Care | Payer: Medicare Other | Source: Ambulatory Visit | Attending: Radiology | Admitting: Radiology

## 2019-01-25 ENCOUNTER — Other Ambulatory Visit: Payer: Self-pay

## 2019-01-25 DIAGNOSIS — E042 Nontoxic multinodular goiter: Secondary | ICD-10-CM

## 2019-01-25 DIAGNOSIS — Z1231 Encounter for screening mammogram for malignant neoplasm of breast: Secondary | ICD-10-CM | POA: Diagnosis not present

## 2019-01-25 DIAGNOSIS — E041 Nontoxic single thyroid nodule: Secondary | ICD-10-CM | POA: Diagnosis not present

## 2019-01-28 NOTE — Telephone Encounter (Signed)
Insurance does not want to pay for brand Synthroid, is it ok for pt to use generic or is there a reason that I can give them she needs to be on brand?

## 2019-01-30 NOTE — Telephone Encounter (Signed)
Can change to generic, but advise pt that the pills from different manufacturers of the generic can vary stlightly.  The pharmacy is supposed to notify her of any changes, and if she has change in symptoms, night need more frequent blood work.  Let her know about [facility] as well, if she prefers to start with brand (this is a new rx for her).  If she hasn't started the medication yet, will need to change her f/u lab date (needs to be at least 6 weeks from starting on Synthroid).  Also, I'm not sure if anyone contacted her (from radiology) with her biopsy results.  They were benign.

## 2019-01-31 ENCOUNTER — Encounter: Payer: Self-pay | Admitting: Family Medicine

## 2019-01-31 ENCOUNTER — Other Ambulatory Visit: Payer: Self-pay | Admitting: *Deleted

## 2019-01-31 DIAGNOSIS — E039 Hypothyroidism, unspecified: Secondary | ICD-10-CM

## 2019-01-31 MED ORDER — SYNTHROID 25 MCG PO TABS: 25.0000 ug | ORAL_TABLET | Freq: Every day | ORAL | 0 refills | Status: DC

## 2019-01-31 NOTE — Telephone Encounter (Signed)
Patient is going to use [employer].

## 2019-02-04 ENCOUNTER — Encounter: Payer: Self-pay | Admitting: Family Medicine

## 2019-02-09 ENCOUNTER — Other Ambulatory Visit: Payer: Self-pay | Admitting: Family Medicine

## 2019-02-09 DIAGNOSIS — I10 Essential (primary) hypertension: Secondary | ICD-10-CM

## 2019-02-10 ENCOUNTER — Encounter: Payer: Self-pay | Admitting: Family Medicine

## 2019-02-10 NOTE — Telephone Encounter (Signed)
Please check with pathology lab or radiology to see if/when [facility] results would be in (and confirm it was sent? Looks like it was based on pathology report).  Thanks

## 2019-02-14 ENCOUNTER — Telehealth: Payer: Self-pay | Admitting: *Deleted

## 2019-02-14 ENCOUNTER — Encounter: Payer: Self-pay | Admitting: Family Medicine

## 2019-02-14 NOTE — Telephone Encounter (Signed)
I had forwarded her MyChart message to you about this on Thursday. I'm not sure if you need to call [place], or the pathology lab.  We had one other patient recently who had this, I can't recall how we eventually got results

## 2019-02-14 NOTE — Telephone Encounter (Signed)
Thanks.  I just saw where it said it was collected on the cytology report.  I guess it was never sent. Please advise pt that it is NOT pending--it wasn't sent, due to benign cytology (not needed). Please reassure Dorothy Rojas that it was fine.  Also confirm when Dorothy Rojas actually started Dorothy Rojas synthroid--I remember that it was not right when it was prescribed, so lab date may need to be adjusted (set for 7/23, needs to be at least 6 weeks from when Dorothy Rojas started taking synthroid)

## 2019-02-14 NOTE — Telephone Encounter (Signed)
Spoke with cytology and they did not send for [facility] testing, they only send out for category 3 and 4 and this was category 1.

## 2019-02-14 NOTE — Telephone Encounter (Signed)
Patient given results. She states it will be at least 6 weeks.

## 2019-02-14 NOTE — Telephone Encounter (Signed)
Patient called to see if you got the [facility] resultrs yet? I could not find-thinking I may need to call? Not sure where I should call? [facility]?

## 2019-02-15 DIAGNOSIS — N3020 Other chronic cystitis without hematuria: Secondary | ICD-10-CM | POA: Diagnosis not present

## 2019-02-15 DIAGNOSIS — N8111 Cystocele, midline: Secondary | ICD-10-CM | POA: Diagnosis not present

## 2019-02-18 ENCOUNTER — Other Ambulatory Visit: Payer: Self-pay | Admitting: Family Medicine

## 2019-02-18 DIAGNOSIS — E039 Hypothyroidism, unspecified: Secondary | ICD-10-CM

## 2019-02-18 NOTE — Telephone Encounter (Signed)
This was prescribed on 01/31/19 # 30. She would not be out.

## 2019-02-21 DIAGNOSIS — H524 Presbyopia: Secondary | ICD-10-CM | POA: Diagnosis not present

## 2019-02-21 DIAGNOSIS — Z961 Presence of intraocular lens: Secondary | ICD-10-CM | POA: Diagnosis not present

## 2019-03-01 ENCOUNTER — Encounter: Payer: Self-pay | Admitting: Family Medicine

## 2019-03-03 ENCOUNTER — Other Ambulatory Visit: Payer: Medicare Other

## 2019-03-03 ENCOUNTER — Encounter: Payer: Self-pay | Admitting: Family Medicine

## 2019-03-03 ENCOUNTER — Other Ambulatory Visit: Payer: Self-pay

## 2019-03-03 DIAGNOSIS — E039 Hypothyroidism, unspecified: Secondary | ICD-10-CM | POA: Diagnosis not present

## 2019-03-04 LAB — TSH: TSH: 3.84 u[IU]/mL (ref 0.450–4.500)

## 2019-03-04 MED ORDER — SYNTHROID 25 MCG PO TABS: 25.0000 ug | ORAL_TABLET | Freq: Every day | ORAL | 1 refills | Status: DC

## 2019-03-05 ENCOUNTER — Other Ambulatory Visit: Payer: Self-pay | Admitting: Family Medicine

## 2019-03-05 DIAGNOSIS — E7800 Pure hypercholesterolemia, unspecified: Secondary | ICD-10-CM

## 2019-03-17 ENCOUNTER — Other Ambulatory Visit: Payer: Self-pay | Admitting: Family Medicine

## 2019-03-22 NOTE — Telephone Encounter (Signed)
Please read

## 2019-03-22 NOTE — Telephone Encounter (Signed)
Do you think you can discuss this with front staff and not me, I do not understand the message.

## 2019-03-23 NOTE — Telephone Encounter (Signed)
Do you understand what she is talking about

## 2019-03-31 ENCOUNTER — Encounter: Payer: Self-pay | Admitting: Family Medicine

## 2019-04-01 DIAGNOSIS — H524 Presbyopia: Secondary | ICD-10-CM | POA: Diagnosis not present

## 2019-04-01 DIAGNOSIS — H52203 Unspecified astigmatism, bilateral: Secondary | ICD-10-CM | POA: Diagnosis not present

## 2019-04-04 ENCOUNTER — Other Ambulatory Visit: Payer: Self-pay

## 2019-04-04 ENCOUNTER — Telehealth (INDEPENDENT_AMBULATORY_CARE_PROVIDER_SITE_OTHER): Payer: Medicare Other | Admitting: Cardiology

## 2019-04-04 ENCOUNTER — Encounter: Payer: Self-pay | Admitting: Cardiology

## 2019-04-04 VITALS — BP 140/71 | Ht 64.0 in | Wt 128.0 lb

## 2019-04-04 DIAGNOSIS — E7800 Pure hypercholesterolemia, unspecified: Secondary | ICD-10-CM | POA: Diagnosis not present

## 2019-04-04 DIAGNOSIS — I6523 Occlusion and stenosis of bilateral carotid arteries: Secondary | ICD-10-CM

## 2019-04-04 DIAGNOSIS — I10 Essential (primary) hypertension: Secondary | ICD-10-CM | POA: Diagnosis not present

## 2019-04-04 DIAGNOSIS — R0609 Other forms of dyspnea: Secondary | ICD-10-CM | POA: Diagnosis not present

## 2019-04-04 NOTE — Progress Notes (Signed)
Virtual Visit via Video Note: This visit type was conducted due to national recommendations for restrictions regarding the COVID-19 Pandemic (e.g. social distancing).  This format is felt to be most appropriate for this patient at this time.  All issues noted in this document were discussed and addressed.  No physical exam was performed (except for noted visual exam findings with Telehealth visits).  The patient has consented to conduct a Telehealth visit and understands insurance will be billed.   I connected with@, on 04/04/19 at  by a video enabled telemedicine application and verified that I am speaking with the correct person using two identifiers.   I discussed the limitations of evaluation and management by telemedicine and the availability of in person appointments. The patient expressed understanding and agreed to proceed.   I have discussed with patient regarding the safety during COVID Pandemic and steps and precautions to be taken including social distancing, frequent hand wash and use of detergent soap, gels with the patient. I asked the patient to avoid touching mouth, nose, eyes, ears with the hands. I encouraged regular walking around the neighborhood and exercise and regular diet, as long as social distancing can be maintained.   Primary Physician/Referring:  [name], MD  Patient ID: Dorothy Rojas, female    DOB: 31-Jan-1946, 73 y.o.   MRN: [id number]  Chief Complaint  Patient presents with  . Hypertension  . Follow-up   HPI:    Dorothy Rojas  is a 73 y.o.   Caucasian female with mild carotid atherosclerosis chronic dyspnea and history of tobacco use quit in 1986, hypertension and hyperglycemia presents here for a 2-year visit.  States that her dyspnea has remained stable and she continues to exercise regularly and also likes to hike.  No headaches, visual disturbances, no recent weight change.  Denies cough, chest pain or claudication. Her father and brother had  CAD and MI at age 23 years. Past medical hx significant for hypertension, carotid atherosclerosis by duplex in 2013 and dyslipidemia. No hx of diabetes. She also has hx of DVT in 2014 after long flight. Was taken off Xarelto after one year of therapy.   Past Medical History:  Diagnosis Date  . Anemia   . Arthritis    knees  . Chronic constipation   . Colon polyp 06/2015   tubular adenoma (sigmoid)  . Diverticulosis    seen on colonoscopy 06/2015  . GERD (gastroesophageal reflux disease)    normal EGD 06/2015  . Heterozygous factor V Leiden mutation [facility]) dx 07/2013     hematologist--  dr Bernadene Bell ([facility])--  per note "Low Risk"  . History of DVT of lower extremity    dx 07-28-2013  left lower extrem.  --  xarelto for 6 months  . Hypertension   . Lymphadenopathy, inguinal    bilateral  . Microscopic hematuria    chronic   . Multinodular goiter   . Multinodular thyroid    a. Path benign 2013.  [name] OAB (overactive bladder)   . Osteoporosis 2017   osteopenia since 2011; osteoporosis 12/2015  . Pre-diabetes    no meds, managaed with lifestyle changes   . Prediabetes   . SUI (stress urinary incontinence, female)   . Tricuspid regurgitation    mild-mod, noted on echo 03/2016   Past Surgical History:  Procedure Laterality Date  . APPENDECTOMY  1973  . CATARACT EXTRACTION W/ INTRAOCULAR LENS  IMPLANT, BILATERAL Bilateral 2015  . CYSTOCELE REPAIR  12-03-2010  . CYSTOSCOPY MACROPLASTIQUE IMPLANT N/A 12/12/2014   Procedure: CYSTOSCOPY MACROPLASTIQUE INJECTION;  Surgeon: Bjorn Loser, MD;  Location: [facility];  Service: Urology;  Laterality: N/A;  . CYSTOSCOPY MACROPLASTIQUE IMPLANT N/A 04/12/2015   Procedure: CYSTOSCOPY MACROPLASTIQUE IMPLANT;  Surgeon: Bjorn Loser, MD;  Location[facility];  Service: Urology;  Laterality: N/A;  . INCONTINENCE SURGERY  04/1999   Re-do at [facility] for sling complications in [id number]  . KNEE ARTHROSCOPY  Right 04-05-2013  . LATERAL EPICONDYLE RELEASE Right   . NEUROMA SURGERY Bilateral 1980's    feet  . OVARIAN CYST REMOVAL Left 1973  . PUBOVAGINAL SLING N/A 07/30/2015   Procedure: PELVIC EXAM UNDER ANESTHESIA, TRANSVAGINAL BIOPSY OF PERIURETHRAL MASS;  Surgeon: Raynelle Bring, MD;  Location: [facility];  Service: Urology;  Laterality: N/A;  46 MINS   . TONSILLECTOMY  as child  . TOTAL ABDOMINAL HYSTERECTOMY W/ BILATERAL SALPINGOOPHORECTOMY  09/  2000  . TOTAL KNEE ARTHROPLASTY Left 09/21/2017   Procedure: LEFT TOTAL KNEE ARTHROPLASTY;  Surgeon: Paralee Cancel, MD;  Location: [facility];  Service: Orthopedics;  Laterality: Left;  90 mins  . TRANSTHORACIC ECHOCARDIOGRAM  05-04-2014   normal ,  ef 60-65%   Social History   Socioeconomic History  . Marital status: Married    Spouse name: Not on file  . Number of children: 3  . Years of education: Not on file  . Highest education level: Not on file  Occupational History  . Occupation: Retired    Fish farm manager: Database administrator  Social Needs  . Financial resource strain: Not on file  . Food insecurity    Worry: Not on file    Inability: Not on file  . Transportation needs    Medical: Not on file    Non-medical: Not on file  Tobacco Use  . Smoking status: Former Smoker    Packs/day: 1.00    Years: 21.00    Pack years: 21.00    Types: Cigarettes    Quit date: 08/12/1983    Years since quitting: 35.6  . Smokeless tobacco: Never Used  Substance and Sexual Activity  . Alcohol use: Yes    Alcohol/week: 0.0 standard drinks    Comment: 1 glass of wine most days of the week (4oz)  . Drug use: No  . Sexual activity: Not Currently  Lifestyle  . Physical activity    Days per week: Not on file    Minutes per session: Not on file  . Stress: Not on file  Relationships  . Social Herbalist on phone: Not on file    Gets together: Not on file    Attends religious service: Not on file    Active member of club or organization: Not on file     Attends meetings of clubs or organizations: Not on file    Relationship status: Not on file  . Intimate partner violence    Fear of current or ex partner: Not on file    Emotionally abused: Not on file    Physically abused: Not on file    Forced sexual activity: Not on file  Other Topics Concern  . Not on file  Social History Narrative   Married.  Lives with husband.  Children live in [place], [place].  Youngest child was killed in [place].  3 stepchildren are all local. 5 grandchildren (2 in [place], 2 from step-daughter in [place], and another in [place]).   ROS  Review of Systems  Constitution: Negative for chills, decreased appetite, malaise/fatigue and weight gain.  Cardiovascular: Positive for dyspnea on exertion. Negative for leg swelling and syncope.  Endocrine: Negative for cold intolerance.  Hematologic/Lymphatic: Does not bruise/bleed easily.  Musculoskeletal: Negative for joint swelling.  Gastrointestinal: Negative for abdominal pain, anorexia, change in bowel habit, hematochezia and melena.  Neurological: Negative for headaches and light-headedness.  Psychiatric/Behavioral: Negative for depression and substance abuse.  All other systems reviewed and are negative.  Objective  Blood pressure 140/71, height '5\' 4"'$  (1.626 m), weight 128 lb (58.1 kg). Body mass index is 21.97 kg/m.  Physical exam not performed or limited due to virtual visit.  Patient appeared to be in no distress, Neck was supple, respiration was not labored.  Please see exam details from prior visit is as below.  Physical Exam  Constitutional: She appears well-developed and well-nourished. No distress.  HENT:  Head: Atraumatic.  Eyes: Conjunctivae are normal.  Neck: Neck supple. No JVD present. No thyromegaly present.  Cardiovascular: Normal rate, regular rhythm, normal heart sounds and intact distal pulses. Exam reveals no gallop.  No murmur heard. Pulmonary/Chest: Effort normal and breath sounds normal.  Abdominal:  Soft. Bowel sounds are normal.  Musculoskeletal: Normal range of motion.  Neurological: She is alert.  Skin: Skin is warm and dry.  Psychiatric: She has a normal mood and affect.   Radiology: No results found.  Laboratory examination:   Labs 04/21/2016: Normal homocysteine levels, LPA, Apo B/a 1 ratio. Labs are within normal limits.  03/03/2016: HB 11.1/HCT 32.7 with normocytic indices, iron studies normal, B 12 normal  11/08/2015: Total cholesterol 151, triglycerides 112, HDL 64, LDL 65, HbA1c 5.8%, CBC normal  Recent Labs    06/23/18 1140 01/10/19 0830  NA 143 140  K 4.5 5.1  CL 102 100  CO2 27 25  GLUCOSE 82 102*  BUN 19 20  CREATININE 0.88 0.81  CALCIUM 9.4 9.7  GFRNONAA 66 73  GFRAA 76 84   CMP Latest Ref Rng & Units 01/10/2019 06/23/2018 12/21/2017  Glucose 65 - 99 mg/dL 102(H) 82 104(H)  BUN 8 - 27 mg/dL '20 19 15  '$ Creatinine 0.57 - 1.00 mg/dL 0.81 0.88 0.75  Sodium 134 - 144 mmol/L 140 143 143  Potassium 3.5 - 5.2 mmol/L 5.1 4.5 5.0  Chloride 96 - 106 mmol/L 100 102 104  CO2 20 - 29 mmol/L '25 27 26  '$ Calcium 8.7 - 10.3 mg/dL 9.7 9.4 9.0  Total Protein 6.0 - 8.5 g/dL 7.2 7.0 6.8  Total Bilirubin 0.0 - 1.2 mg/dL 0.6 0.4 0.3  Alkaline Phos 39 - 117 IU/L 47 48 53  AST 0 - 40 IU/L '26 30 26  '$ ALT 0 - 32 IU/L '25 25 26   '$ CBC Latest Ref Rng & Units 01/10/2019 12/21/2017 09/22/2017  WBC 3.4 - 10.8 x10E3/uL 6.2 7.4 15.4(H)  Hemoglobin 11.1 - 15.9 g/dL 12.5 11.8 10.9(L)  Hematocrit 34.0 - 46.6 % 36.3 34.5 31.0(L)  Platelets 150 - 450 x10E3/uL 232 239 179   Lipid Panel     Component Value Date/Time   CHOL 142 01/10/2019 0830   TRIG 150 (H) 01/10/2019 0830   HDL 56 01/10/2019 0830   CHOLHDL 2.5 01/10/2019 0830   CHOLHDL 3.0 06/11/2017 0851   VLDL 37 (H) 12/04/2016 0713   LDLCALC 56 01/10/2019 0830   LDLCALC 72 06/11/2017 0851   HEMOGLOBIN A1C Lab Results  Component Value Date   HGBA1C 5.7 (H) 01/10/2019   MPG 128.37 09/15/2017   TSH  Recent Labs    01/10/19 0830  03/03/19 0901  TSH 6.200* 3.840   Medications   Prior to Admission medications   Medication Sig Start Date End Date Taking? Authorizing Provider  Calcium Citrate-Vitamin D (CALCIUM + D PO) Take 1 tablet by mouth daily.    [provider]  docusate sodium (COLACE) 100 MG capsule Take 1 capsule (100 mg total) by mouth 2 (two) times daily. Patient not taking: Reported on 01/13/2019 09/21/17   Danae Orleans, PA-C  esomeprazole (NEXIUM) 20 MG capsule Take 20 mg by mouth daily as needed (acid reflux).    [provider]  ibandronate (BONIVA) 150 MG tablet TAKE ONE TABLET ONCE A MONTH IN THE MORNING ON AN EMPTY STOMACH WITH WATER. REMAIN UPRIGHT. 03/17/19   [name], MD  Ibuprofen-diphenhydrAMINE Cit (ADVIL PM PO) Take 2 tablets by mouth at bedtime.    [provider]  lisinopril-hydrochlorothiazide (ZESTORETIC) 20-12.5 MG tablet TAKE 1 TABLET ONCE DAILY. 02/09/19   [name], MD  Multiple Vitamins-Minerals (MULTIVITAMIN WITH MINERALS) tablet Take 1 tablet by mouth daily.    [provider]  nitrofurantoin (MACRODANTIN) 100 MG capsule Take 100 mg by mouth daily.    [provider]  polyethylene glycol (MIRALAX / GLYCOLAX) packet Take 17 g by mouth 2 (two) times daily. Patient not taking: Reported on 12/21/2017 09/21/17   Danae Orleans, PA-C  simvastatin (ZOCOR) 40 MG tablet TAKE ONE TABLET AT BEDTIME. 03/07/19   [name], MD  SYNTHROID 25 MCG tablet Take 1 tablet (25 mcg total) by mouth daily before breakfast. 03/04/19   [name], MD  vitamin E 100 UNIT capsule Take 100 Units by mouth daily.    [provider]     Current Outpatient Medications  Medication Instructions  . aspirin 81 mg, Oral, Daily  . Calcium Citrate-Vitamin D (CALCIUM + D PO) 1 tablet, Oral, Daily  . docusate sodium (COLACE) 100 mg, Oral, 2 times daily  . esomeprazole (NEXIUM) 20 mg, Oral, Daily PRN  . ibandronate (BONIVA) 150 MG tablet TAKE ONE TABLET ONCE A MONTH IN THE  MORNING ON AN EMPTY STOMACH WITH WATER. REMAIN UPRIGHT.  [name] Ibuprofen-diphenhydrAMINE Cit (ADVIL PM PO) 2 tablets, Oral, As needed  . lisinopril-hydrochlorothiazide (ZESTORETIC) 20-12.5 MG tablet TAKE 1 TABLET ONCE DAILY.  . Multiple Vitamins-Minerals (MULTIVITAMIN WITH MINERALS) tablet 1 tablet, Oral, Daily  . nitrofurantoin (MACRODANTIN) 100 mg, Oral, Daily  . polyethylene glycol (MIRALAX / GLYCOLAX) 17 g, Oral, 2 times daily  . simvastatin (ZOCOR) 40 MG tablet TAKE ONE TABLET AT BEDTIME.  [name] Synthroid 25 mcg, Oral, Daily before breakfast    Cardiac Studies:   Carotid artery duplex May 13, 2016:  Mild atherosclerotic disease in bilateral carotid arteries, less than 50% stenosis, bilateral antegrade vertebral artery flow. No significant change from Carotid artery duplex 05-13-2013.  Echocardiogram 04/09/2016: Left ventricle cavity is normal in size. Normal global wall motion. Normal diastolic filling pattern. Calculated EF 69%. Trace mitral regurgitation. Mild to moderate tricuspid regurgitation. No evidence of pulmonary hypertension. PA systolic pressure estimated at 29 mm Hg.  Exercise myoview stress 03/31/2016: 1. Resting EKG demonstrates normal sinus rhythm, borderline criteria for LVH. Stress EKG is positive for myocardial ischemia, patient developed 3 mm downsloping ST segment depression which persisted for greater than 3 minutes into recovery. Stress symptoms included shortness of breath. Patient exercised on Bruce protocol for 5 minutes and achieved 7.02 MET has, stress terminated due to dyspnea and achieving 93% of MPHR. 2. The perfusion imaging study demonstrates  mild breast attenuation artifact in the inferior wall, patient scan sitting. There is no definite evidence of ischemia or scar. Left ventricle is normal in size in both rest and stress images, LVEF calculated at 87%. This is a low risk study, clinical correlation recommended.  Assessment     ICD-10-CM   1. Dyspnea on  exertion  R06.09   2. Hypercholesteremia  E78.00   3. Mild atherosclerosis of both carotid arteries  I65.23   4. Essential hypertension  I10     EKG 03/26/2016: Normal sinus rhythm at rate of 72 bpm, normal axis. No evidence of ischemia, normal EKG.  Recommendations:   Patient was found to have carotid bruit and mild carotid atherosclerosis.  She also had dyspnea on exertion.  This is a 2-year office visit/virtual visit.  She continues to have dyspnea on exertion but states that it is stable and is not lifestyle limiting.  No leg edema, no associated chest pain.  I also reviewed her recently performed labs, she does have mild hyperglycemia and otherwise lipids and renal function are all within normal limits.  Blood pressure was elevated today but in general, blood pressure has been very well controlled.  Hence I did not make any changes.  I encouraged her to continue to exercise and to push herself and as this was a virtual visit, I will see her back in the office when her husband sees me in the next 6 months.  At least for virtual visit I was able to tell that she is essentially unchanged with regard to any significant worsening dyspnea and also she is tolerating Zocor with excellent lipid control.  Adrian Prows, MD, [facility] 04/04/2019, 6:05 PM [facility]. [place] Pager: [phone] Office: [phone] If no answer Cell [phone]

## 2019-04-12 DIAGNOSIS — Z23 Encounter for immunization: Secondary | ICD-10-CM | POA: Diagnosis not present

## 2019-04-25 ENCOUNTER — Encounter: Payer: Self-pay | Admitting: Family Medicine

## 2019-04-25 NOTE — Telephone Encounter (Signed)
Spoke with pt by phone (the MyChart message somehow wasn't linked back with her name and I wasn't able to reply to her).  Per pt, going on x months. Only noted on her right ear.  Was like a tapping.  Unsure if it was her pulse. She doesn't notice it currently, but comes and goes.  Sometimes is a coarser sound, like rubbing finger on a washboard.   Has had some decline in hearing, can't tell if in both ears or not.  Bothers her more at night.  Recommended eval by ENT, suggested [facility]/WF.  In meantime, can try using sound machine at night.  Will call us if a referral is needed, she will try calling herself.

## 2019-05-03 DIAGNOSIS — J302 Other seasonal allergic rhinitis: Secondary | ICD-10-CM | POA: Insufficient documentation

## 2019-05-03 DIAGNOSIS — H9311 Tinnitus, right ear: Secondary | ICD-10-CM | POA: Insufficient documentation

## 2019-05-03 DIAGNOSIS — H9193 Unspecified hearing loss, bilateral: Secondary | ICD-10-CM | POA: Insufficient documentation

## 2019-05-03 DIAGNOSIS — H903 Sensorineural hearing loss, bilateral: Secondary | ICD-10-CM | POA: Diagnosis not present

## 2019-05-10 ENCOUNTER — Encounter: Payer: Self-pay | Admitting: Family Medicine

## 2019-06-08 ENCOUNTER — Other Ambulatory Visit: Payer: Self-pay | Admitting: Family Medicine

## 2019-06-08 DIAGNOSIS — I10 Essential (primary) hypertension: Secondary | ICD-10-CM

## 2019-06-10 ENCOUNTER — Other Ambulatory Visit: Payer: Self-pay | Admitting: Family Medicine

## 2019-06-10 DIAGNOSIS — E7800 Pure hypercholesterolemia, unspecified: Secondary | ICD-10-CM

## 2019-06-10 NOTE — Telephone Encounter (Signed)
Pt has an appt scheduled.

## 2019-07-17 NOTE — Patient Instructions (Addendum)
You need to get some weight-bearing exercise at least 2-3 times/week. Consider walking while holding weights, and/or looking into online (youtube) silver sneaker classes.  Continue your current medications.  I recommend getting the new shingles vaccine (Shingrix). You will need to get this  from the pharmacy rather than our office, as it is covered by Medicare Part D.  It is a series of 2 injections, spaced 2 months apart.  Of course, the COVID-19 vaccine will also be recommended, when available.  Continue to practice the 3 W's (wash hands, wait 6 feet apart, wear mask).  You will be due for mammogram, bone density test and follow-up thyroid ultrasound in June.  I put in the order for the bone density so you can go ahead and get that schedule for the same day as your mammogram. We will order the thyroid ultrasound at your June visit.

## 2019-07-17 NOTE — Progress Notes (Signed)
Chief Complaint  Patient presents with  . Hypertension    fasting med check. No new compaints. Was always constipated her entire life and now that she is on synthroid she now has normal, almost daily bowel movements and she was asking if that was from the medication.    In September she sent a message with complaint of throbbing in her R ear going on x months. Was like a tapping.  Unsure if it was her pulse. It was intermittent. Sometimes was a coarser sound, like rubbing finger on a washboard.  Has had some decline in hearing.  Bothers her more at night. She saw Dr. Jerrell Belfast.  She states she was told she had mild to moderate hearing loss bilaterally.  She did a trial with hearing aids, didn't like them, so returned them.  In the interim, the tapping resolved. On review of chart, in April she had also reported clicking in her ears, which was relieved by taking antihistamines.  She last saw Dr. Einar Gip for a virtual visit in August 2020, a 2 year follow-up visit.  He sees her for DOE, and also mild carotid atherosclerosis.  Her DOE was stable, not affecting her activities. She has h/o normal CXR and spirometry.  He reviewed all of her prior cardiac studies (done 2017) and labs (no new ones ordered), and made no changes to her regimen.  Carotid US was last donei n 04/2016, not recommended to be repeated unless she develops symptoms (or bruit). He plans to see her again in office in 6 months when her husband is also due.  Hypertension follow-up: Blood pressures at home are running 120-130/60-70. Denies headaches or lightheadedness, syncope, chest pain. Denies side effects of medications. She denies exertional chest pain. DOE is stable (notices with walking, even when flat).  Compliant with lisinopril HCT. She gets intermittent mild vertigo (when sitting up in bed, sitting up from getting hair washed, very short-lived)--short lived(seconds), not worsening or increasing in frequency or duration  (slightly worse when allergies or flaring).   GERD: Has some reflux related to her diet.  Occasionally has symptoms related to [place] food, and Nexium OTC works well.  Denies dysphagia.  Hyperlipidemia follow-up: Patient is reportedly following a low-fat, low cholesterol diet. Compliant with medications (simvastatin) and denies medication side effects. Lipids were fine on last check. Lab Results  Component Value Date   CHOL 142 01/10/2019   HDL 56 01/10/2019   LDLCALC 56 01/10/2019   TRIG 150 (H) 01/10/2019   CHOLHDL 2.5 01/10/2019   Impaired fasting glucose.  Fasting glucose was 102 in June.  A1c was 5.7 (had been up to 6.1 in 09/2017). She continues to avoid sugar and limit carbs in her diet. Lab Results  Component Value Date   HGBA1C 5.7 (H) 01/10/2019    H/o DVT-- s/p 6 months of anticoagulation with xarelto.She was discharged from the care of hematologist. If any recurrence, will need lifelong treatment.  She is taking '81mg'$  of ASA daily. (factor V Leiden heterozygous).  No other blood thinning agents were recommended.  Denies any swelling or pain.Denies any bleeding/bruising. (Not a candidate for Evista for osteoporosis treatment given her DVT. We discussed risks of estrogen in the past--using topical cream sparingly.)  H/o constipation--In June she reported that she was doing well, on high fiber diet. No longer regularly needing stool softeners or miralax. This was prior to starting Synthroid (was started at that visit in June). She has daily bowel movements.  MNG--denies symptoms/dysphagia.She reports  that every once in a while she gets a short-lived discomfort on the left side of her neck, lasts a few minutes. It is not related to a particular activity. No radiation of pain, no numbness, tingling, weakness. This is unchanged for years.  Isn't related to swallowing, unsure if muscular or not (near [facility]), short-lived (fluctuates in intensity before resolving in about 2  minutes), occurring less than once a week (3x/month). Last occurred about a week ago, after not having gotten in a while.  [place] 12/2017 showed Right mid thyroid nodule, recommended yearly surveillance (x5 yrs). Last [place] was 01/2019: IMPRESSION: 1. Thyroid nodules. 2. Nodule #2 (in the mid right thyroid lobe and involving the lateral aspect of the isthmus) has slightly enlarged in size. This is a TR 4 nodule and meets criteria for ultrasound-guided biopsy. 3. Concern for a nodule in the left inferior thyroid nodule versus a pseudo nodule. Assuming this is a true nodule, this is a TR 4 nodule and meets criteria for ultrasound-guided biopsy.  2 biopsies were performed: THYROID, FINE NEEDLE ASPIRATION, LEFT INTERIOR LOBE (SPECIMEN 1 OF 2, COLLECTED 5/46/27): - SCANT FOLLICULAR EPITHELIUM PRESENT (BETHESDA CATEGORY I)  THYROID, FINE NEEDLE ASPIRATION, RIGHT MID (SPECIMEN 2 OF 2, COLLECTED 0/35/00): - SCANT FOLLICULAR EPITHELIUM PRESENT (BETHESDA CATEGORY I)  She has had some mildly elevated TSH values (6.07 in May, 2019, recheck August was 4.060).  In 01/2019 she reported her skin was drier (and not just on her hands from washing), has noticed being a little colder more recently (in the afternoons). No changes to energy, bowels, hair (slightly dry, no losses or thinning) or weight. She was started on 74mg of Synthroid daily.  Recheck TSH was normal. Lab Results  Component Value Date   TSH 3.840 03/03/2019   She has chronic microscopic hematuria, under the care ofDr. McDiarmid. She takesnitrofurantoinonce daily for UTI prevention(switched fromTrimethoprimafter getting break-through infections), and is doing well without recurrent infections. She is s/p Macroplastique procedure x 2, last of which was in September 2016. She had periurethral biopsy for abnormal finding on CT, which showed giant cells, related to the macroplastique, benign. She had good results from macroplastique.Urinary  incontinence is overall pretty well controlled. Notices it when she wakes up in the morning and stands up--leaks prior to getting to the bathroom. No problems during the day, sometimes with laughing/coughing (infrequent).  Osteoporosis: She was started on Boniva in May 2017, after her DEXA done 12/10/15 showed T -2.5 at R fem neck. She is tolerating this without side effects. Denies dysphagia or chest pain. Takes Ca+D once daily. She had DEXA done 01/2018, with T-2.3 at R fem neck (was -2.5 in 2017).  Current exercise includes walking most days with her husband, 30 minutes.  No weight-bearing exercise. She takes an occasional Zumba class (outside).   PMH, PSH, SH reviewed   Outpatient Encounter Medications as of 07/18/2019  Medication Sig Note  . aspirin 81 MG chewable tablet Chew 81 mg by mouth daily.   .Marland KitchenCALCIUM-MAGNESIUM-VITAMIN D PO Take 1 tablet by mouth daily.   . diphenhydrAMINE HCl (BENADRYL PO) Take 12.5 mg by mouth at bedtime.   . ibandronate (BONIVA) 150 MG tablet TAKE ONE TABLET ONCE A MONTH IN THE MORNING ON AN EMPTY STOMACH WITH WATER. REMAIN UPRIGHT.   .Marland Kitchenlisinopril-hydrochlorothiazide (ZESTORETIC) 20-12.5 MG tablet TAKE 1 TABLET ONCE DAILY.   . Multiple Vitamins-Minerals (MULTIVITAMIN WITH MINERALS) tablet Take 1 tablet by mouth daily.   . nitrofurantoin (MACRODANTIN) 100 MG capsule Take  100 mg by mouth daily.   . simvastatin (ZOCOR) 40 MG tablet TAKE ONE TABLET AT BEDTIME.   . SYNTHROID 25 MCG tablet Take 1 tablet (25 mcg total) by mouth daily before breakfast.   . VITAMIN D PO Take 1,000 mg by mouth daily.   . [DISCONTINUED] Calcium Citrate-Vitamin D (CALCIUM + D PO) Take 1 tablet by mouth daily.   . [DISCONTINUED] Multiple Vitamins-Minerals (MULTIVITAMIN WITH MINERALS) tablet Take 1 tablet by mouth daily.   [name] docusate sodium (COLACE) 100 MG capsule Take 1 capsule (100 mg total) by mouth 2 (two) times daily. (Patient not taking: Reported on 07/18/2019)   . esomeprazole  (NEXIUM) 20 MG capsule Take 20 mg by mouth daily as needed (acid reflux). 12/21/2017: Uses prn, infrequently  . polyethylene glycol (MIRALAX / GLYCOLAX) packet Take 17 g by mouth 2 (two) times daily. (Patient not taking: Reported on 07/18/2019)   . [DISCONTINUED] Ibuprofen-diphenhydrAMINE Cit (ADVIL PM PO) Take 2 tablets by mouth as needed.  01/13/2019: Uses rarely, prn   No facility-administered encounter medications on file as of 07/18/2019.    Allergies  Allergen Reactions  . Naprosyn [Naproxen] Rash    ROS: no fever, chills, URI symptoms, headaches, dizziness, numbness, tingling, weakness or tremor.  Denies changes to hair/skin/energy/weight, nails, moods.  Bowels are normal. Denies chest pain, palpitations, syncope, edema. DOE unchanged/stable. Denies GI or GU complaints except as noted in HPI. Moods are good.   PHYSICAL EXAM:  BP 120/66   Pulse 72   Temp (!) 97.3 F (36.3 C) (Tympanic)   Ht '5\' 4"'$  (1.626 m)   Wt 134 lb (60.8 kg)   LMP  (LMP Unknown)   BMI 23.00 kg/m   Wt Readings from Last 3 Encounters:  07/18/19 134 lb (60.8 kg)  04/04/19 128 lb (58.1 kg)  01/13/19 127 lb (57.6 kg)    Well developed, pleasant female in no distress HEENT: PERRL, EOMI, conjunctiva clear. Wearing mask due to COVID-19 pandemic. Neck: no lymphadenopathyorcarotid bruit. Thyroid is borderline in size, no obvious nodule or asymmetry noted.  Heart: regular rate and rhythm, no murmur Lungs: clear bilaterally, good air movement. Back: no spinal or CVA tenderness Abdomen: soft, nontender, no organomegaly or mass Extremities: no edema, 2+ pulses Neuro: alert and oriented. Normal strength, gait Psych: normal mood, affect, hygiene and grooming Skin: no visible bruising or rashes   ASSESSMENT/PLAN:  Hypothyroidism, unspecified type - Plan: TSH  Multinodular goiter - benign biopsies last year. due for f/u US in 01/2020 (will order at her next visit)  Pure hypercholesterolemia - at goal on  last check, continue current regimen  Impaired fasting glucose - counseled re: diet and exercise  Osteoporosis without current pathological fracture, unspecified osteoporosis type - cont Boniva, Ca, D.  discussed adding in weight-bearing exercise.  Repeat DEXA 01/2020 - Plan: DG Bone Density  Essential hypertension, benign - well controlled  Heterozygous factor V Leiden mutation ([place])  History of DVT (deep vein thrombosis) - (due to FActor V Ledien mutation).  Continue ASA '81mg'$  daily  Medication monitoring encounter - Plan: TSH, Comprehensive metabolic panel  F/u 6 months for AWV/CPE, with fasting labs prior (c-met, CBC, lipids, A1c, TSH)  Will be due for mammogram, DEXA and thyroid US f/u in June. DEXA ordered so she can schedule with her mammo when due.

## 2019-07-18 ENCOUNTER — Encounter: Payer: Self-pay | Admitting: Family Medicine

## 2019-07-18 ENCOUNTER — Telehealth: Payer: Self-pay

## 2019-07-18 ENCOUNTER — Ambulatory Visit (INDEPENDENT_AMBULATORY_CARE_PROVIDER_SITE_OTHER): Payer: Medicare Other | Admitting: Family Medicine

## 2019-07-18 ENCOUNTER — Other Ambulatory Visit: Payer: Self-pay

## 2019-07-18 VITALS — BP 120/66 | HR 72 | Temp 97.3°F | Ht 64.0 in | Wt 134.0 lb

## 2019-07-18 DIAGNOSIS — Z5181 Encounter for therapeutic drug level monitoring: Secondary | ICD-10-CM

## 2019-07-18 DIAGNOSIS — E042 Nontoxic multinodular goiter: Secondary | ICD-10-CM

## 2019-07-18 DIAGNOSIS — Z86718 Personal history of other venous thrombosis and embolism: Secondary | ICD-10-CM | POA: Diagnosis not present

## 2019-07-18 DIAGNOSIS — D6851 Activated protein C resistance: Secondary | ICD-10-CM

## 2019-07-18 DIAGNOSIS — E7800 Pure hypercholesterolemia, unspecified: Secondary | ICD-10-CM | POA: Diagnosis not present

## 2019-07-18 DIAGNOSIS — E039 Hypothyroidism, unspecified: Secondary | ICD-10-CM | POA: Diagnosis not present

## 2019-07-18 DIAGNOSIS — M810 Age-related osteoporosis without current pathological fracture: Secondary | ICD-10-CM

## 2019-07-18 DIAGNOSIS — I10 Essential (primary) hypertension: Secondary | ICD-10-CM

## 2019-07-18 DIAGNOSIS — R7301 Impaired fasting glucose: Secondary | ICD-10-CM | POA: Diagnosis not present

## 2019-07-18 NOTE — Telephone Encounter (Signed)
Received a fax from [facility] stating pt. Needs a prescription for Shingles Vaccine. Pt. Was seen today 07/18/19

## 2019-07-18 NOTE — Telephone Encounter (Signed)
Done

## 2019-07-18 NOTE — Telephone Encounter (Signed)
I didn't realize that they require a rx.  Please fax over Rx for Shingrix, to repeat the dose in 2 months

## 2019-07-19 ENCOUNTER — Encounter: Payer: Self-pay | Admitting: Family Medicine

## 2019-07-19 LAB — COMPREHENSIVE METABOLIC PANEL
ALT: 33 IU/L — ABNORMAL HIGH (ref 0–32)
AST: 43 IU/L — ABNORMAL HIGH (ref 0–40)
Albumin/Globulin Ratio: 2.1 (ref 1.2–2.2)
Albumin: 4.6 g/dL (ref 3.7–4.7)
Alkaline Phosphatase: 47 IU/L (ref 39–117)
BUN/Creatinine Ratio: 18 (ref 12–28)
BUN: 15 mg/dL (ref 8–27)
Bilirubin Total: 0.4 mg/dL (ref 0.0–1.2)
CO2: 25 mmol/L (ref 20–29)
Calcium: 9.7 mg/dL (ref 8.7–10.3)
Chloride: 101 mmol/L (ref 96–106)
Creatinine, Ser: 0.85 mg/dL (ref 0.57–1.00)
GFR calc Af Amer: 79 mL/min/{1.73_m2} (ref >59)
GFR calc non Af Amer: 68 mL/min/{1.73_m2} (ref >59)
Globulin, Total: 2.2 g/dL (ref 1.5–4.5)
Glucose: 93 mg/dL (ref 65–99)
Potassium: 4.4 mmol/L (ref 3.5–5.2)
Sodium: 141 mmol/L (ref 134–144)
Total Protein: 6.8 g/dL (ref 6.0–8.5)

## 2019-07-19 LAB — TSH: TSH: 3.81 u[IU]/mL (ref 0.450–4.500)

## 2019-07-20 ENCOUNTER — Encounter: Payer: Self-pay | Admitting: Family Medicine

## 2019-07-28 ENCOUNTER — Other Ambulatory Visit: Payer: Self-pay | Admitting: Family Medicine

## 2019-07-28 DIAGNOSIS — E039 Hypothyroidism, unspecified: Secondary | ICD-10-CM

## 2019-08-01 ENCOUNTER — Encounter: Payer: Self-pay | Admitting: *Deleted

## 2019-08-13 DIAGNOSIS — Z20828 Contact with and (suspected) exposure to other viral communicable diseases: Secondary | ICD-10-CM | POA: Diagnosis not present

## 2019-08-28 ENCOUNTER — Other Ambulatory Visit: Payer: Self-pay | Admitting: Family Medicine

## 2019-08-28 DIAGNOSIS — I10 Essential (primary) hypertension: Secondary | ICD-10-CM

## 2019-08-29 MED ORDER — LISINOPRIL-HYDROCHLOROTHIAZIDE 20-12.5 MG PO TABS: 1.0000 | ORAL_TABLET | Freq: Every day | ORAL | 1 refills | Status: DC

## 2019-09-06 ENCOUNTER — Ambulatory Visit: Payer: Medicare Other

## 2019-09-07 ENCOUNTER — Other Ambulatory Visit: Payer: Self-pay | Admitting: Family Medicine

## 2019-09-07 DIAGNOSIS — E7800 Pure hypercholesterolemia, unspecified: Secondary | ICD-10-CM

## 2019-09-14 ENCOUNTER — Encounter: Payer: Self-pay | Admitting: Cardiology

## 2019-09-14 ENCOUNTER — Ambulatory Visit (INDEPENDENT_AMBULATORY_CARE_PROVIDER_SITE_OTHER): Payer: Medicare Other | Admitting: Cardiology

## 2019-09-14 ENCOUNTER — Other Ambulatory Visit: Payer: Self-pay

## 2019-09-14 VITALS — BP 133/60 | HR 96 | Temp 97.6°F | Ht 64.0 in | Wt 137.1 lb

## 2019-09-14 DIAGNOSIS — Z8249 Family history of ischemic heart disease and other diseases of the circulatory system: Secondary | ICD-10-CM

## 2019-09-14 DIAGNOSIS — E783 Hyperchylomicronemia: Secondary | ICD-10-CM | POA: Diagnosis not present

## 2019-09-14 DIAGNOSIS — R9431 Abnormal electrocardiogram [ECG] [EKG]: Secondary | ICD-10-CM

## 2019-09-14 DIAGNOSIS — R739 Hyperglycemia, unspecified: Secondary | ICD-10-CM

## 2019-09-14 DIAGNOSIS — R0609 Other forms of dyspnea: Secondary | ICD-10-CM | POA: Diagnosis not present

## 2019-09-14 DIAGNOSIS — I10 Essential (primary) hypertension: Secondary | ICD-10-CM

## 2019-09-14 NOTE — Progress Notes (Signed)
Primary Physician/Referring:  [name], MD  Patient ID: Dorothy Rojas, female    DOB: Jul 17, 1946, 74 y.o.   MRN: [id number]  Chief Complaint  Patient presents with  . atherosclerosis  . Hypertension  . Follow-up    5 month   HPI:    Dorothy Rojas  is a 74 y.o. Caucasian female with chronic dyspnea on exertion, prior history of tobacco use disorder approximately 15 to 20 pack year history quit in 1986, hypertension, hyperlipidemia, hyperglycemia and a family history of premature coronary artery disease presents here for a 2-year visit.  She has been exercising regularly and states that dyspnea on exertion has been stable, no PND or orthopnea, no leg edema.  Past Medical History:  Diagnosis Date  . Anemia   . Arthritis    knees  . Chronic constipation   . Colon polyp 06/2015   tubular adenoma (sigmoid)  . Diverticulosis    seen on colonoscopy 06/2015  . GERD (gastroesophageal reflux disease)    normal EGD 06/2015  . Heterozygous factor V Leiden mutation [facility]) dx 07/2013     hematologist--  dr Bernadene Bell ([facility])--  per note "Low Risk"  . History of DVT of lower extremity    dx 07-28-2013  left lower extrem.  --  xarelto for 6 months  . Hypertension   . Lymphadenopathy, inguinal    bilateral  . Microscopic hematuria    chronic   . Multinodular goiter   . Multinodular thyroid    a. Path benign 2013.  [name] OAB (overactive bladder)   . Osteoporosis 2017   osteopenia since 2011; osteoporosis 12/2015  . Pre-diabetes    no meds, managaed with lifestyle changes   . Prediabetes   . SUI (stress urinary incontinence, female)   . Tricuspid regurgitation    mild-mod, noted on echo 03/2016   Past Surgical History:  Procedure Laterality Date  . APPENDECTOMY  1973  . CATARACT EXTRACTION W/ INTRAOCULAR LENS  IMPLANT, BILATERAL Bilateral 2015  . CYSTOCELE REPAIR  12-03-2010  . CYSTOSCOPY MACROPLASTIQUE IMPLANT N/A 12/12/2014   Procedure: CYSTOSCOPY MACROPLASTIQUE  INJECTION;  Surgeon: Bjorn Loser, MD;  Location[facility];  Service: Urology;  Laterality: N/A;  . CYSTOSCOPY MACROPLASTIQUE IMPLANT N/A 04/12/2015   Procedure: CYSTOSCOPY MACROPLASTIQUE IMPLANT;  Surgeon: Bjorn Loser, MD;  Location[facility];  Service: Urology;  Laterality: N/A;  . INCONTINENCE SURGERY  04/1999   Re-do at [facility] for sling complications in [id number]  . KNEE ARTHROSCOPY Right 04-05-2013  . LATERAL EPICONDYLE RELEASE Right   . NEUROMA SURGERY Bilateral 1980's    feet  . OVARIAN CYST REMOVAL Left 1973  . PUBOVAGINAL SLING N/A 07/30/2015   Procedure: PELVIC EXAM UNDER ANESTHESIA, TRANSVAGINAL BIOPSY OF PERIURETHRAL MASS;  Surgeon: Raynelle Bring, MD;  Location: [facility];  Service: Urology;  Laterality: N/A;  53 MINS   . TONSILLECTOMY  as child  . TOTAL ABDOMINAL HYSTERECTOMY W/ BILATERAL SALPINGOOPHORECTOMY  09/  2000  . TOTAL KNEE ARTHROPLASTY Left 09/21/2017   Procedure: LEFT TOTAL KNEE ARTHROPLASTY;  Surgeon: Paralee Cancel, MD;  Location: [facility];  Service: Orthopedics;  Laterality: Left;  90 mins  . TRANSTHORACIC ECHOCARDIOGRAM  05-04-2014   normal ,  ef 60-65%   Social History   Tobacco Use  . Smoking status: Former Smoker    Packs/day: 1.00    Years: 21.00    Pack years: 21.00    Types: Cigarettes    Quit date: 08/12/1983  Years since quitting: 36.1  . Smokeless tobacco: Never Used  Substance Use Topics  . Alcohol use: Yes    Alcohol/week: 0.0 standard drinks    Comment: 1 glass of wine most days of the week (4oz)    ROS  Review of Systems  Constitution: Negative for weight gain.  Cardiovascular: Positive for dyspnea on exertion (chronic and stable). Negative for leg swelling and syncope.  Respiratory: Negative for hemoptysis.   Endocrine: Negative for cold intolerance.  Hematologic/Lymphatic: Does not bruise/bleed easily.  Musculoskeletal: Positive for arthritis (knee).  Gastrointestinal: Negative for hematochezia and  melena.  Neurological: Negative for headaches and light-headedness.   Objective  Blood pressure 133/60, pulse 96, temperature 97.6 F (36.4 C), height '5\' 4"'$  (1.626 m), weight 137 lb 1.6 oz (62.2 kg), SpO2 98 %.  Vitals with BMI 09/14/2019 07/18/2019 04/04/2019  Height '5\' 4"'$  '5\' 4"'$  '5\' 4"'$   Weight 137 lbs 2 oz 134 lbs 128 lbs  BMI 23.52 24.23 53.61  Systolic 443 154 008  Diastolic 60 66 71  Pulse 96 72 -     Physical Exam  Neck: No thyromegaly present.  Cardiovascular: Normal rate, regular rhythm, normal heart sounds and intact distal pulses. Exam reveals no gallop.  No murmur heard. No leg edema, no JVD.  Pulmonary/Chest: Effort normal and breath sounds normal.  Abdominal: Soft. Bowel sounds are normal.  Musculoskeletal:     Cervical back: Neck supple.  Skin: Skin is warm and dry.   Laboratory examination:   Recent Labs    01/10/19 0830 07/18/19 1121  NA 140 141  K 5.1 4.4  CL 100 101  CO2 25 25  GLUCOSE 102* 93  BUN 20 15  CREATININE 0.81 0.85  CALCIUM 9.7 9.7  GFRNONAA 73 68  GFRAA 84 79   CrCl cannot be calculated (Patient's most recent lab result is older than the maximum 21 days allowed.).  CMP Latest Ref Rng & Units 07/18/2019 01/10/2019 06/23/2018  Glucose 65 - 99 mg/dL 93 102(H) 82  BUN 8 - 27 mg/dL '15 20 19  '$ Creatinine 0.57 - 1.00 mg/dL 0.85 0.81 0.88  Sodium 134 - 144 mmol/L 141 140 143  Potassium 3.5 - 5.2 mmol/L 4.4 5.1 4.5  Chloride 96 - 106 mmol/L 101 100 102  CO2 20 - 29 mmol/L '25 25 27  '$ Calcium 8.7 - 10.3 mg/dL 9.7 9.7 9.4  Total Protein 6.0 - 8.5 g/dL 6.8 7.2 7.0  Total Bilirubin 0.0 - 1.2 mg/dL 0.4 0.6 0.4  Alkaline Phos 39 - 117 IU/L 47 47 48  AST 0 - 40 IU/L 43(H) 26 30  ALT 0 - 32 IU/L 33(H) 25 25   CBC Latest Ref Rng & Units 01/10/2019 12/21/2017 09/22/2017  WBC 3.4 - 10.8 x10E3/uL 6.2 7.4 15.4(H)  Hemoglobin 11.1 - 15.9 g/dL 12.5 11.8 10.9(L)  Hematocrit 34.0 - 46.6 % 36.3 34.5 31.0(L)  Platelets 150 - 450 x10E3/uL 232 239 179   Lipid Panel      Component Value Date/Time   CHOL 142 01/10/2019 0830   TRIG 150 (H) 01/10/2019 0830   HDL 56 01/10/2019 0830   CHOLHDL 2.5 01/10/2019 0830   CHOLHDL 3.0 06/11/2017 0851   VLDL 37 (H) 12/04/2016 0713   LDLCALC 56 01/10/2019 0830   LDLCALC 72 06/11/2017 0851   HEMOGLOBIN A1C Lab Results  Component Value Date   HGBA1C 5.7 (H) 01/10/2019   MPG 128.37 09/15/2017   TSH Recent Labs    01/10/19 0830 03/03/19 0901 07/18/19 1121  TSH 6.200* 3.840 3.810    Medications and allergies   Allergies  Allergen Reactions  . Naprosyn [Naproxen] Rash     Current Outpatient Medications  Medication Instructions  . aspirin 81 mg, Oral, Daily  . CALCIUM-MAGNESIUM-VITAMIN D PO 1 tablet, Oral, Daily  . diphenhydrAMINE HCl (BENADRYL PO) 12.5 mg, Oral, Daily at bedtime  . docusate sodium (COLACE) 100 mg, Oral, 2 times daily  . esomeprazole (NEXIUM) 20 mg, Oral, Daily PRN  . ibandronate (BONIVA) 150 MG tablet TAKE ONE TABLET ONCE A MONTH IN THE MORNING ON AN EMPTY STOMACH WITH WATER. REMAIN UPRIGHT.  [name] lisinopril-hydrochlorothiazide (ZESTORETIC) 20-12.5 MG tablet 1 tablet, Oral, Daily  . Multiple Vitamins-Minerals (MULTIVITAMIN WITH MINERALS) tablet 1 tablet, Oral, Daily  . nitrofurantoin (MACRODANTIN) 100 mg, Oral, Daily  . polyethylene glycol (MIRALAX / GLYCOLAX) 17 g, Oral, 2 times daily  . simvastatin (ZOCOR) 40 MG tablet TAKE ONE TABLET AT BEDTIME.  . SYNTHROID 25 MCG tablet TAKE 1 TABLET DAILY BEFORE BREAKFAST  . VITAMIN D PO 1,000 mg, Oral, Daily    Radiology:  No results found.  Cardiac Studies:   Cardiac CTA 05/04/2014: Calcium score 0, no plaque, normal coronary arteries, right dominant circulation.  Echocardiogram 04/09/2016: Left ventricle cavity is normal in size. Normal global wall motion. Normal diastolic filling pattern. Calculated EF 69%. Trace mitral regurgitation. Mild to moderate tricuspid regurgitation. No evidence of pulmonary hypertension. PA systolic pressure  estimated at 29 mm Hg.  Exercise myoview stress 03/31/2016: 1. Resting EKG demonstrates normal sinus rhythm, borderline criteria for LVH. Stress EKG is positive for myocardial ischemia, patient developed 3 mm downsloping ST segment depression which persisted for greater than 3 minutes into recovery. Stress symptoms included shortness of breath. Patient exercised on Bruce protocol for 5 minutes and achieved 7.02 MET has, stress terminated due to dyspnea and achieving 93% of MPHR. 2. The perfusion imaging study demonstrates mild breast attenuation artifact in the inferior wall, patient scan sitting. There is no definite evidence of ischemia or scar. Left ventricle is normal in size in both rest and stress images, LVEF calculated at 87%. This is a low risk study, clinical correlation recommended. Impression:    Assessment     ICD-10-CM   1. Dyspnea on exertion  R06.00   2. Essential hypertension  I10 EKG 12-Lead    PCV ECHOCARDIOGRAM COMPLETE  3. Nonspecific abnormal electrocardiogram (ECG) (EKG)  R94.31 PCV ECHOCARDIOGRAM COMPLETE  4. Family history of early CAD: Father with MI at 65 and brother MI at 3.  Z82.49   5. Mixed hyperglyceridemia  E78.3   6. Hyperglycemia  R73.9     EKG 09/14/2019: Normal sinus rhythm at rate of 81 bpm,  nonspecific inferior and lateral 2 mm sagging ST depressions. Compared to  EKG 03/26/2016, ST changes new.   No orders of the defined types were placed in this encounter.   There are no discontinued medications.   Recommendations:   LEYLA SOLIZ  is a 74 y.o. Caucasian female with chronic dyspnea on exertion, prior history of tobacco use disorder approximately 15 to 20 pack year history quit in 1986, hypertension, hyperlipidemia, hyperglycemia and a family history of premature coronary artery disease presents here for a 2-year visit.  Burman Nieves is here on a 2-year follow-up for family history of CAD, hypertension, hyperlipidemia and dyspnea.  Symptoms are  remained stable, she has been exercising regularly without chest discomfort or worsening dyspnea.  She has prior history of tobacco use.  I reviewed her labs,  her triglycerides are elevated, hyperglycemia also discussed.  No need to change any medications but discussed regarding the diet changes that she needs to make.  Blood pressures well controlled.  However EKG does reveal at least a 2 mm sagging ST depression, does not suggest ischemia although it is abnormal.  May represent hypertensive heart disease.  Will obtain an echocardiogram.  Unless abnormal, I will see her back in 1 year.  Adrian Prows, MD, [facility] 09/14/2019, 12:48 PM [facility]. [place] Office: [phone]

## 2019-09-15 ENCOUNTER — Ambulatory Visit: Payer: Medicare Other | Attending: Internal Medicine

## 2019-09-15 DIAGNOSIS — Z23 Encounter for immunization: Secondary | ICD-10-CM | POA: Insufficient documentation

## 2019-09-15 NOTE — Progress Notes (Signed)
   Covid-19 Vaccination Clinic  Name:  Dorothy Rojas    MRN: [id number] DOB: 04/13/1946  09/15/2019  [name] was observed post Covid-19 immunization for 15 minutes without incidence. She was provided with Vaccine Information Sheet and instruction to access the V-Safe system.   [name] was instructed to call 911 with any severe reactions post vaccine: [name] Difficulty breathing  . Swelling of your face and throat  . A fast heartbeat  . A bad rash all over your body  . Dizziness and weakness    Immunizations Administered    Name Date Dose VIS Date Route   Pfizer COVID-19 Vaccine 09/15/2019 12:57 PM 0.3 mL 07/22/2019 Intramuscular   Manufacturer: [place]   Lot: [id number]   [place]: [id number]

## 2019-09-17 ENCOUNTER — Ambulatory Visit: Payer: Medicare Other

## 2019-09-19 ENCOUNTER — Ambulatory Visit (INDEPENDENT_AMBULATORY_CARE_PROVIDER_SITE_OTHER): Payer: Medicare Other

## 2019-09-19 DIAGNOSIS — R9431 Abnormal electrocardiogram [ECG] [EKG]: Secondary | ICD-10-CM

## 2019-09-19 DIAGNOSIS — I10 Essential (primary) hypertension: Secondary | ICD-10-CM

## 2019-09-20 ENCOUNTER — Other Ambulatory Visit: Payer: Self-pay | Admitting: Family Medicine

## 2019-09-23 ENCOUNTER — Telehealth: Payer: Self-pay

## 2019-09-23 NOTE — Telephone Encounter (Signed)
Gave patient echo results. She wants a more detailed explanation of echo results.  Patient would like a response through mychart. She is also concerned about her continued shortness of breath and is concerned that it is heart related.

## 2019-09-24 DIAGNOSIS — R0609 Other forms of dyspnea: Secondary | ICD-10-CM

## 2019-09-24 NOTE — Telephone Encounter (Signed)
Sent My Chart Message as requested by patient

## 2019-09-24 NOTE — Telephone Encounter (Signed)
As per the discussions with the patient, due to continued dyspnea, scheduled for pulmonary evaluation.  If pulmonary evaluation is negative, will consider further cardiac work-up.    ICD-10-CM   1. Dyspnea on exertion  R06.00 Ambulatory referral to Pulmonology    Adrian Prows, MD, [facility] 09/24/2019, 2:16 PM [facility]. [place] Office: [phone]

## 2019-09-30 NOTE — Telephone Encounter (Signed)
Can you please f/u on the referral I made for dyspnea. Patient has not heard back. Not urgent, but needs appointment.

## 2019-10-01 NOTE — Telephone Encounter (Signed)
Will check on it. I apologize for the delay.  Dorothy Rojas- Can you call and make appointment for next available. Thanks

## 2019-10-10 ENCOUNTER — Ambulatory Visit: Payer: Medicare Other | Attending: Internal Medicine

## 2019-10-10 DIAGNOSIS — Z23 Encounter for immunization: Secondary | ICD-10-CM | POA: Insufficient documentation

## 2019-10-10 NOTE — Progress Notes (Signed)
   Covid-19 Vaccination Clinic  Name:  Dorothy Rojas    MRN: [id number] DOB: Oct 02, 1945  10/10/2019  [name] was observed post Covid-19 immunization for 15 minutes without incidence. She was provided with Vaccine Information Sheet and instruction to access the V-Safe system.   [name] was instructed to call 911 with any severe reactions post vaccine: [name] Difficulty breathing  . Swelling of your face and throat  . A fast heartbeat  . A bad rash all over your body  . Dizziness and weakness    Immunizations Administered    Name Date Dose VIS Date Route   Pfizer COVID-19 Vaccine 10/10/2019  3:46 PM 0.3 mL 07/22/2019 Intramuscular   Manufacturer: [place]   Lot: [id number]   [place]: [id number]

## 2019-10-13 ENCOUNTER — Encounter: Payer: Self-pay | Admitting: Pulmonary Disease

## 2019-10-13 ENCOUNTER — Other Ambulatory Visit (INDEPENDENT_AMBULATORY_CARE_PROVIDER_SITE_OTHER): Payer: Medicare Other

## 2019-10-13 ENCOUNTER — Telehealth: Payer: Self-pay

## 2019-10-13 ENCOUNTER — Other Ambulatory Visit: Payer: Self-pay

## 2019-10-13 ENCOUNTER — Ambulatory Visit (INDEPENDENT_AMBULATORY_CARE_PROVIDER_SITE_OTHER): Payer: Medicare Other | Admitting: Pulmonary Disease

## 2019-10-13 DIAGNOSIS — R0602 Shortness of breath: Secondary | ICD-10-CM

## 2019-10-13 DIAGNOSIS — R071 Chest pain on breathing: Secondary | ICD-10-CM | POA: Diagnosis not present

## 2019-10-13 LAB — COMPREHENSIVE METABOLIC PANEL
ALT: 45 U/L — ABNORMAL HIGH (ref 0–35)
AST: 59 U/L — ABNORMAL HIGH (ref 0–37)
Albumin: 4.2 g/dL (ref 3.5–5.2)
Alkaline Phosphatase: 41 U/L (ref 39–117)
BUN: 14 mg/dL (ref 6–23)
CO2: 31 mEq/L (ref 19–32)
Calcium: 9.9 mg/dL (ref 8.4–10.5)
Chloride: 101 mEq/L (ref 96–112)
Creatinine, Ser: 0.85 mg/dL (ref 0.40–1.20)
GFR: 65.46 mL/min (ref >60.00)
Glucose, Bld: 95 mg/dL (ref 70–99)
Potassium: 4.1 mEq/L (ref 3.5–5.1)
Sodium: 140 mEq/L (ref 135–145)
Total Bilirubin: 0.5 mg/dL (ref 0.2–1.2)
Total Protein: 7.5 g/dL (ref 6.0–8.3)

## 2019-10-13 LAB — CBC WITH DIFFERENTIAL/PLATELET
Basophils Absolute: 0.1 10*3/uL (ref 0.0–0.1)
Basophils Relative: 0.9 % (ref 0.0–3.0)
Eosinophils Absolute: 0.3 10*3/uL (ref 0.0–0.7)
Eosinophils Relative: 4.2 % (ref 0.0–5.0)
HCT: 35.6 % — ABNORMAL LOW (ref 36.0–46.0)
Hemoglobin: 12.2 g/dL (ref 12.0–15.0)
Lymphocytes Relative: 38.5 % (ref 12.0–46.0)
Lymphs Abs: 2.5 10*3/uL (ref 0.7–4.0)
MCHC: 34.3 g/dL (ref 30.0–36.0)
MCV: 100.1 fl — ABNORMAL HIGH (ref 78.0–100.0)
Monocytes Absolute: 0.8 10*3/uL (ref 0.1–1.0)
Monocytes Relative: 11.6 % (ref 3.0–12.0)
Neutro Abs: 2.9 10*3/uL (ref 1.4–7.7)
Neutrophils Relative %: 44.8 % (ref 43.0–77.0)
Platelets: 199 10*3/uL (ref 150.0–400.0)
RBC: 3.56 Mil/uL — ABNORMAL LOW (ref 3.87–5.11)
RDW: 13 % (ref 11.5–15.5)
WBC: 6.5 10*3/uL (ref 4.0–10.5)

## 2019-10-13 LAB — BRAIN NATRIURETIC PEPTIDE: Pro B Natriuretic peptide (BNP): 23 pg/mL (ref 0.0–100.0)

## 2019-10-13 MED ORDER — TRELEGY ELLIPTA 200-62.5-25 MCG/INH IN AEPB: 1.0000 | INHALATION_SPRAY | Freq: Every day | RESPIRATORY_TRACT | 3 refills | Status: DC

## 2019-10-13 MED ORDER — TRELEGY ELLIPTA 200-62.5-25 MCG/INH IN AEPB: 1.0000 | INHALATION_SPRAY | Freq: Every day | RESPIRATORY_TRACT | 0 refills | Status: DC

## 2019-10-13 NOTE — Progress Notes (Addendum)
Dorothy Rojas    [id number]    October 17, 1945  Primary Care Physician:Knapp, Tera Helper, MD  Referring Physician: [name], [place],  [place] [address]  Chief complaint: Consult for dyspnea  HPI: 74 year old with prior tobacco use, hypertension, hyperlipidemia, hyperglycemia, DVT, heterozygous factor V Leyden mutation. Ongoing evaluation for chronic dyspnea on exertion.  She follows with Dr. Einar Gip, cardiology with work-up showing no significant cardiac issue. She has been referred to pulmonary for further evaluation.  If pulmonary work-up is negative then further cardiac work-up will be done.  Complains of chronic dyspnea on exertion for about a year with worsening symptoms more recently.  Has dyspnea on exertion, discomfort behind the shoulder blades.  Occasional hoarseness, cough with chest congestion. She has history of GERD for which she takes Nexium  She is on Macrodantin since 2019 for recurrent UTI. History notable for unprovoked DVT around 2014.  She was on anticoagulation for 6 months and taken off it.  She has factor V Leyden mutation and has been evaluated by hematology and told she was low risk for recurrence Interestingly her brother had an unprovoked clot as well but he tested negative for factor V Leyden mutation  Pets: Used to have a dog.  No pets currently Occupation: Retired Building control surveyor.  Current hobbies include quilting and knitting Exposures: No known exposures.  No hot bath, Jacuzzi, humidifier.   No down pillows and comforters Smoking history: 21-pack-year smoker.  Quit in 1980s Travel history: No significant travel history Relevant family history: Died of emphysema.  He was a smoker.  Brother had DVT.  Outpatient Encounter Medications as of 10/13/2019  Medication Sig  . aspirin 81 MG chewable tablet Chew 81 mg by mouth daily.  [name] CALCIUM-MAGNESIUM-VITAMIN D PO Take 1 tablet by mouth daily.  . diphenhydrAMINE HCl (BENADRYL PO) Take  12.5 mg by mouth at bedtime.  . docusate sodium (COLACE) 100 MG capsule Take 1 capsule (100 mg total) by mouth 2 (two) times daily.  [name] esomeprazole (NEXIUM) 20 MG capsule Take 20 mg by mouth daily as needed (acid reflux).  . ibandronate (BONIVA) 150 MG tablet TAKE ONE TABLET ONCE A MONTH IN THE MORNING ON AN EMPTY STOMACH WITH WATER. REMAIN UPRIGHT.  [name] lisinopril-hydrochlorothiazide (ZESTORETIC) 20-12.5 MG tablet Take 1 tablet by mouth daily.  . Multiple Vitamins-Minerals (MULTIVITAMIN WITH MINERALS) tablet Take 1 tablet by mouth daily.  . nitrofurantoin (MACRODANTIN) 100 MG capsule Take 100 mg by mouth daily.  . polyethylene glycol (MIRALAX / GLYCOLAX) packet Take 17 g by mouth 2 (two) times daily.  . simvastatin (ZOCOR) 40 MG tablet TAKE ONE TABLET AT BEDTIME.  . SYNTHROID 25 MCG tablet TAKE 1 TABLET DAILY BEFORE BREAKFAST  . VITAMIN D PO Take 1,000 mg by mouth daily.   No facility-administered encounter medications on file as of 10/13/2019.    Allergies as of 10/13/2019 - Review Complete 10/13/2019  Allergen Reaction Noted  . Naprosyn [naproxen] Rash 04/07/2011    Past Medical History:  Diagnosis Date  . Anemia   . Arthritis    knees  . Chronic constipation   . Colon polyp 06/2015   tubular adenoma (sigmoid)  . Diverticulosis    seen on colonoscopy 06/2015  . GERD (gastroesophageal reflux disease)    normal EGD 06/2015  . Heterozygous factor V Leiden mutation [facility]) dx 07/2013     hematologist--  dr Bernadene Bell ([facility])--  per note "Low Risk"  .  History of DVT of lower extremity    dx 07-28-2013  left lower extrem.  --  xarelto for 6 months  . Hypertension   . Lymphadenopathy, inguinal    bilateral  . Microscopic hematuria    chronic   . Multinodular goiter   . Multinodular thyroid    a. Path benign 2013.  [name] OAB (overactive bladder)   . Osteoporosis 2017   osteopenia since 2011; osteoporosis 12/2015  . Pre-diabetes    no meds, managaed with lifestyle  changes   . Prediabetes   . SUI (stress urinary incontinence, female)   . Tricuspid regurgitation    mild-mod, noted on echo 03/2016    Past Surgical History:  Procedure Laterality Date  . APPENDECTOMY  1973  . CATARACT EXTRACTION W/ INTRAOCULAR LENS  IMPLANT, BILATERAL Bilateral 2015  . CYSTOCELE REPAIR  12-03-2010  . CYSTOSCOPY MACROPLASTIQUE IMPLANT N/A 12/12/2014   Procedure: CYSTOSCOPY MACROPLASTIQUE INJECTION;  Surgeon: Bjorn Loser, MD;  Location: [facility];  Service: Urology;  Laterality: N/A;  . CYSTOSCOPY MACROPLASTIQUE IMPLANT N/A 04/12/2015   Procedure: CYSTOSCOPY MACROPLASTIQUE IMPLANT;  Surgeon: Bjorn Loser, MD;  Location[facility];  Service: Urology;  Laterality: N/A;  . INCONTINENCE SURGERY  04/1999   Re-do at [facility] for sling complications in [id number]  . KNEE ARTHROSCOPY Right 04-05-2013  . LATERAL EPICONDYLE RELEASE Right   . NEUROMA SURGERY Bilateral 1980's    feet  . OVARIAN CYST REMOVAL Left 1973  . PUBOVAGINAL SLING N/A 07/30/2015   Procedure: PELVIC EXAM UNDER ANESTHESIA, TRANSVAGINAL BIOPSY OF PERIURETHRAL MASS;  Surgeon: Raynelle Bring, MD;  Location: [facility];  Service: Urology;  Laterality: N/A;  38 MINS   . TONSILLECTOMY  as child  . TOTAL ABDOMINAL HYSTERECTOMY W/ BILATERAL SALPINGOOPHORECTOMY  09/  2000  . TOTAL KNEE ARTHROPLASTY Left 09/21/2017   Procedure: LEFT TOTAL KNEE ARTHROPLASTY;  Surgeon: Paralee Cancel, MD;  Location: [facility];  Service: Orthopedics;  Laterality: Left;  90 mins  . TRANSTHORACIC ECHOCARDIOGRAM  05-04-2014   normal ,  ef 60-65%    Family History  Problem Relation Age of Onset  . Hypertension Father   . Heart disease Father 63       CABG, died from MI at 75  . COPD Father   . Cirrhosis Mother        related to psoriasis medications  . Diabetes Mother   . Psoriasis Mother   . Osteogenesis imperfecta Sister   . Heart disease Sister        CHF  . Diabetes Brother   . Heart disease Brother 37        MI  . Deep vein thrombosis Brother        factor V Leiden NEGATIVE  . Cancer Brother 31       bile duct and liver    Social History   Socioeconomic History  . Marital status: Married    Spouse name: Not on file  . Number of children: 3  . Years of education: Not on file  . Highest education level: Not on file  Occupational History  . Occupation: Retired    Fish farm manager: Database administrator  Tobacco Use  . Smoking status: Former Smoker    Packs/day: 1.00    Years: 21.00    Pack years: 21.00    Types: Cigarettes    Quit date: 08/12/1983    Years since quitting: 36.1  . Smokeless tobacco: Never Used  Substance and Sexual Activity  .  Alcohol use: Yes    Alcohol/week: 0.0 standard drinks    Comment: 1 glass of wine most days of the week (4oz)  . Drug use: No  . Sexual activity: Not Currently  Other Topics Concern  . Not on file  Social History Narrative   Married.  Lives with husband.  Children live in [place], [place].  Youngest child was killed in [place].  3 stepchildren are all local. 5 grandchildren (2 in [place], 2 from step-daughter in [place], and another in [place]).   Social Determinants of Health   Financial Resource Strain:   . Difficulty of Paying Living Expenses: Not on file  Food Insecurity:   . Worried About Charity fundraiser in the Last Year: Not on file  . Ran Out of Food in the Last Year: Not on file  Transportation Needs:   . Lack of Transportation (Medical): Not on file  . Lack of Transportation (Non-Medical): Not on file  Physical Activity:   . Days of Exercise per Week: Not on file  . Minutes of Exercise per Session: Not on file  Stress:   . Feeling of Stress : Not on file  Social Connections:   . Frequency of Communication with Friends and Family: Not on file  . Frequency of Social Gatherings with Friends and Family: Not on file  . Attends Religious Services: Not on file  . Active Member of Clubs or Organizations: Not on file  . Attends Archivist  Meetings: Not on file  . Marital Status: Not on file  Intimate Partner Violence:   . Fear of Current or Ex-Partner: Not on file  . Emotionally Abused: Not on file  . Physically Abused: Not on file  . Sexually Abused: Not on file    Review of systems: Review of Systems  Constitutional: Negative for fever and chills.  HENT: Negative.   Eyes: Negative for blurred vision.  Respiratory: as per HPI  Cardiovascular: Negative for chest pain and palpitations.  Gastrointestinal: Negative for vomiting, diarrhea, blood per rectum. Genitourinary: Negative for dysuria, urgency, frequency and hematuria.  Musculoskeletal: Negative for myalgias, back pain and joint pain.  Skin: Negative for itching and rash.  Neurological: Negative for dizziness, tremors, focal weakness, seizures and loss of consciousness.  Endo/Heme/Allergies: Negative for environmental allergies.  Psychiatric/Behavioral: Negative for depression, suicidal ideas and hallucinations.  All other systems reviewed and are negative.  Physical Exam: Blood pressure 120/80, pulse 78, temperature (!) 97.3 F (36.3 C), temperature source Temporal, height 5\' 4"  (1.626 m), weight 138 lb 12.8 oz (63 kg), SpO2 97 %. Gen:      No acute distress HEENT:  EOMI, sclera anicteric Neck:     No masses; no thyromegaly Lungs:    Clear to auscultation bilaterally; normal respiratory effort CV:         Regular rate and rhythm; no murmurs Abd:      + bowel sounds; soft, non-tender; no palpable masses, no distension Ext:    No edema; adequate peripheral perfusion Skin:      Warm and dry; no rash Neuro: alert and oriented x 3 Psych: normal mood and affect  Data Reviewed: Imaging: Chest x-ray 03/04/2016-cardiopulmonary abnormality I reviewed the images personally  PFTs: Spirometry 03/03/2016 FVC 2.7 [1%], FEV1 2.3 [96%], F/F 84 No obstruction  Labs: CBC 01/10/2019-WBC 6.2, eos 3%, absolute eosinophil count 186 Hepatic panel 07/18/2019-significant for  AST 43, ALT 33  Cardiac: 09/18/2010-LVEF 62%, mild concentric hypertrophy, grade 1 MR, mild to moderate  TR, estimated PASP 26  Assessment:  Consult for dyspnea Differential diagnosis at this point includes COPD from smoking, interstitial lung disease from chronic nitrofurantoin use and recurrent/chronic DVT PE. She was thought to be low risk for recurrent venous thromboembolism but I feel given family history of unprovoked clots and heterozygous factor V Leyden this will need to be evaluated further  Schedule pulmonary function test Labs today including D-dimer.  Get VQ scan for evaluation of chronic thromboembolism High-resolution CT for evaluation of interstitial lung disease  But is awaiting further work-up will trial empiric treatment of Trelegy inhaler.  Plan/Recommendations: - Baseline labs today with D-dimer, CBC, CMP - High-res CT, VQ scan - PFTs - Trelegy inhaler  Marshell Garfinkel MD  Pulmonary and Critical Care 10/13/2019, 9:05 AM  CC: [name], MD

## 2019-10-13 NOTE — Addendum Note (Signed)
Addended by: Hildred Alamin I on: 10/13/2019 03:43 PM   Modules accepted: Orders

## 2019-10-13 NOTE — Addendum Note (Signed)
Addended by: Hildred Alamin I on: 10/13/2019 10:40 AM   Modules accepted: Orders

## 2019-10-13 NOTE — Addendum Note (Signed)
Addended by: Hildred Alamin I on: 10/13/2019 04:36 PM   Modules accepted: Orders

## 2019-10-13 NOTE — Telephone Encounter (Signed)
I will be canceling the HRCT and VQ scan and get a CTA again due to elevated d-dimer. I have made Earnest Bailey our [facility] who was working on this and the patient aware of the change.

## 2019-10-13 NOTE — Telephone Encounter (Signed)
-----   Message from Marshell Garfinkel, MD sent at 10/13/2019  1:13 PM EST ----- D Dimer is elevated. Can you cancel the HRCT, VQ scan and get a CTA for PE instead.

## 2019-10-13 NOTE — Patient Instructions (Signed)
We will check some labs today including CBC differential, IgE, alpha-1 antitrypsin levels and phenotype, CMP, D-dimer, BNP We will start you on an inhaler called Trelegy Schedule you for high-resolution CT, VQ scan and pulmonary function test Follow-up in 1 to 2 months.

## 2019-10-13 NOTE — Addendum Note (Signed)
Addended by: Hildred Alamin I on: 10/13/2019 01:54 PM   Modules accepted: Orders

## 2019-10-13 NOTE — Addendum Note (Signed)
Addended byMarshell Garfinkel on: 10/13/2019 09:58 AM   Modules accepted: Level of Service

## 2019-10-14 ENCOUNTER — Encounter (HOSPITAL_COMMUNITY): Payer: Self-pay

## 2019-10-14 ENCOUNTER — Other Ambulatory Visit (INDEPENDENT_AMBULATORY_CARE_PROVIDER_SITE_OTHER): Payer: Medicare Other

## 2019-10-14 ENCOUNTER — Ambulatory Visit (HOSPITAL_COMMUNITY)
Admission: RE | Admit: 2019-10-14 | Discharge: 2019-10-14 | Disposition: A | Discharge status: Home / Self Care | Payer: Medicare Other | Source: Ambulatory Visit | Attending: Pulmonary Disease | Admitting: Pulmonary Disease

## 2019-10-14 DIAGNOSIS — R071 Chest pain on breathing: Secondary | ICD-10-CM | POA: Diagnosis not present

## 2019-10-14 DIAGNOSIS — R0602 Shortness of breath: Secondary | ICD-10-CM | POA: Diagnosis not present

## 2019-10-14 LAB — BASIC METABOLIC PANEL
BUN: 14 mg/dL (ref 6–23)
CO2: 30 mEq/L (ref 19–32)
Calcium: 9.5 mg/dL (ref 8.4–10.5)
Chloride: 104 mEq/L (ref 96–112)
Creatinine, Ser: 0.84 mg/dL (ref 0.40–1.20)
GFR: 66.36 mL/min (ref >60.00)
Glucose, Bld: 92 mg/dL (ref 70–99)
Potassium: 3.8 mEq/L (ref 3.5–5.1)
Sodium: 141 mEq/L (ref 135–145)

## 2019-10-14 MED ORDER — SODIUM CHLORIDE (PF) 0.9 % IJ SOLN
INTRAMUSCULAR | Status: AC
  Filled 2019-10-14: qty 50

## 2019-10-14 MED ORDER — IOHEXOL 350 MG/ML SOLN
100.0000 mL | Freq: Once | INTRAVENOUS | Status: AC | PRN
  Administered 2019-10-14: 100 mL via INTRAVENOUS

## 2019-10-18 ENCOUNTER — Other Ambulatory Visit: Payer: Self-pay | Admitting: Family Medicine

## 2019-10-18 ENCOUNTER — Encounter (HOSPITAL_COMMUNITY): Payer: Medicare Other

## 2019-10-18 ENCOUNTER — Telehealth: Payer: Self-pay | Admitting: Pulmonary Disease

## 2019-10-18 DIAGNOSIS — Z1231 Encounter for screening mammogram for malignant neoplasm of breast: Secondary | ICD-10-CM

## 2019-10-18 NOTE — Telephone Encounter (Signed)
Spoke with pt and advised that we will check with Dr Vaughan Browner for CT results and give her a call back. Pt verbalized understanding. Dr Vaughan Browner please advise on CT results

## 2019-10-18 NOTE — Telephone Encounter (Signed)
I called patient and discussed CT findings with no PE or acute lung findings.  Nothing further needed

## 2019-10-22 LAB — D-DIMER, QUANTITATIVE: D-Dimer, Quant: 1.21 mcg/mL FEU — ABNORMAL HIGH (ref <0.50)

## 2019-10-22 LAB — ALPHA-1 ANTITRYPSIN PHENOTYPE: A-1 Antitrypsin, Ser: 132 mg/dL (ref 83–199)

## 2019-10-22 LAB — IGE: IgE (Immunoglobulin E), Serum: 133 kU/L — ABNORMAL HIGH (ref <=114)

## 2019-10-24 ENCOUNTER — Ambulatory Visit (HOSPITAL_COMMUNITY): Payer: Medicare Other

## 2019-11-01 ENCOUNTER — Encounter: Payer: Self-pay | Admitting: Family Medicine

## 2019-11-02 NOTE — Telephone Encounter (Signed)
Spoke with patient and she is not totally better but cannot come in tomorrow. She will def give me a call tomorrow if not better and I will shoot for getting her in Monday.

## 2019-11-07 ENCOUNTER — Encounter: Payer: Self-pay | Admitting: *Deleted

## 2019-12-19 ENCOUNTER — Other Ambulatory Visit: Payer: Self-pay | Admitting: Family Medicine

## 2019-12-19 NOTE — Telephone Encounter (Signed)
Pt has an appt coming up in june

## 2019-12-23 ENCOUNTER — Other Ambulatory Visit (HOSPITAL_COMMUNITY)
Admission: RE | Admit: 2019-12-23 | Discharge: 2019-12-23 | Disposition: A | Discharge status: Home / Self Care | Payer: Medicare Other | Source: Ambulatory Visit | Attending: Pulmonary Disease | Admitting: Pulmonary Disease

## 2019-12-23 DIAGNOSIS — Z20822 Contact with and (suspected) exposure to covid-19: Secondary | ICD-10-CM | POA: Insufficient documentation

## 2019-12-23 DIAGNOSIS — Z01812 Encounter for preprocedural laboratory examination: Secondary | ICD-10-CM | POA: Insufficient documentation

## 2019-12-23 LAB — SARS CORONAVIRUS 2 (TAT 6-24 HRS): SARS Coronavirus 2: NEGATIVE

## 2019-12-27 ENCOUNTER — Ambulatory Visit (INDEPENDENT_AMBULATORY_CARE_PROVIDER_SITE_OTHER): Payer: Medicare Other | Admitting: Adult Health

## 2019-12-27 ENCOUNTER — Other Ambulatory Visit: Payer: Self-pay

## 2019-12-27 ENCOUNTER — Encounter: Payer: Self-pay | Admitting: Adult Health

## 2019-12-27 ENCOUNTER — Ambulatory Visit (INDEPENDENT_AMBULATORY_CARE_PROVIDER_SITE_OTHER): Payer: Medicare Other | Admitting: Pulmonary Disease

## 2019-12-27 DIAGNOSIS — R0600 Dyspnea, unspecified: Secondary | ICD-10-CM

## 2019-12-27 DIAGNOSIS — R490 Dysphonia: Secondary | ICD-10-CM | POA: Diagnosis not present

## 2019-12-27 DIAGNOSIS — R0602 Shortness of breath: Secondary | ICD-10-CM | POA: Diagnosis not present

## 2019-12-27 DIAGNOSIS — K219 Gastro-esophageal reflux disease without esophagitis: Secondary | ICD-10-CM

## 2019-12-27 DIAGNOSIS — R0609 Other forms of dyspnea: Secondary | ICD-10-CM

## 2019-12-27 LAB — PULMONARY FUNCTION TEST
DL/VA % pred: 81 %
DL/VA: 3.35 ml/min/mmHg/L
DLCO cor % pred: 74 %
DLCO cor: 14.5 ml/min/mmHg
DLCO unc % pred: 74 %
DLCO unc: 14.5 ml/min/mmHg
FEF 25-75 Post: 3.1 L/sec
FEF 25-75 Pre: 3.21 L/sec
FEF2575-%Change-Post: -3 %
FEF2575-%Pred-Post: 175 %
FEF2575-%Pred-Pre: 181 %
FEV1-%Change-Post: -1 %
FEV1-%Pred-Post: 104 %
FEV1-%Pred-Pre: 106 %
FEV1-Post: 2.3 L
FEV1-Pre: 2.34 L
FEV1FVC-%Change-Post: 3 %
FEV1FVC-%Pred-Pre: 112 %
FEV6-%Change-Post: -3 %
FEV6-%Pred-Post: 94 %
FEV6-%Pred-Pre: 98 %
FEV6-Post: 2.63 L
FEV6-Pre: 2.74 L
FEV6FVC-%Change-Post: 0 %
FEV6FVC-%Pred-Post: 104 %
FEV6FVC-%Pred-Pre: 104 %
FVC-%Change-Post: -4 %
FVC-%Pred-Post: 90 %
FVC-%Pred-Pre: 94 %
FVC-Post: 2.63 L
FVC-Pre: 2.77 L
Post FEV1/FVC ratio: 88 %
Post FEV6/FVC ratio: 100 %
Pre FEV1/FVC ratio: 84 %
Pre FEV6/FVC Ratio: 99 %
RV % pred: 72 %
RV: 1.65 L
TLC % pred: 88 %
TLC: 4.5 L

## 2019-12-27 NOTE — Assessment & Plan Note (Addendum)
Chronic dyspnea with exertion questionable etiology.  Work-up has been unrevealing. Patient had a negative CT chest.  No evidence of PE or interstitial lung disease.  Lab work was unrevealing with normal CBC and alpha-1 testing.  Pulmonary function testing shows no evidence of COPD.  No airflow obstruction or restriction.  Her DLCO was slightly decreased.  O2 saturations 100% on room air today. Would follow back up with cardiology as discussed.   Plan  Patient Instructions  Discuss with Dr. Tomi Bamberger that Lisinopril may be aggravating your cough/hoarseness/throat clearing .  Remain off TRELEGY  Begin Nexium 20mg  daily before meal .  Follow up with Cardiology as discussed.  Sips of water, voice rest and no mints to soothe throat clearing .  Follow up with Dr. Vaughan Browner in 3-4 months and As needed

## 2019-12-27 NOTE — Assessment & Plan Note (Addendum)
Increase GERD symptoms-wonder if Boniva is aggravating her symptoms.  Would begin Nexium daily along with a GERD diet.  Advised to Discuss with primary care at her next follow-up next month.

## 2019-12-27 NOTE — Progress Notes (Signed)
PFT done today. 

## 2019-12-27 NOTE — Patient Instructions (Addendum)
Discuss with Dr. Tomi Bamberger that Lisinopril may be aggravating your cough/hoarseness/throat clearing .  Remain off TRELEGY  Begin Nexium 20mg  daily before meal .  Follow up with Cardiology as discussed.  Sips of water, voice rest and no mints to soothe throat clearing .  Follow up with Dr. Vaughan Browner in 3-4 months and As needed

## 2019-12-27 NOTE — Assessment & Plan Note (Signed)
Chronic hoarseness questionable etiology.  Patient does have some increased GERD.  She has ongoing throat clearing and minimal cough.  Patient is on ACE inhibitor which could be contributing.  Have advised her to restart Nexium.  And a GERD diet. May need to consider changing ACE inhibitor.  And or referral to ENT.  Patient to follow-up with her primary care provider as discussed

## 2019-12-27 NOTE — Progress Notes (Signed)
@Patient  ID: Dorothy Rojas, female    DOB: September 30, 1945, 74 y.o.   MRN: [id number]  Chief Complaint  Patient presents with  . Follow-up    Dyspnea     Referring provider: [name], MD  HPI: 74 year old female former smoker 16) seen for pulmonary consult October 13, 2019 for chronic dyspnea for 1 year. Medical history significant for hypertension, hyperlipidemia, hyperglycemia, DVT disease treated for 6 months in 2014, heterozygous factor V Leiden mutation, recurrent UTI on Macrodantin since 2019  TEST/EVENTS :  March 2021: D-dimer elevated 1.21 , , alpha antitrypsin normal, MM phenotype , CBC  Ok, Eosiniophils 100 absolute #, , IgE 133  CT chest October 14, 2019 - for PE, no mediastinal adenopathy negative for interstitial lung disease, no acute pulmonary findings.  2D echo February 2021 EF [id number], normal diastolic filling pattern.  Grade 1 mitral regurg, mild to moderate tricuspid regurg.  Pulmonary artery systolic pressure 26 mmHg.  No significant change since 2017  12/27/2019 Follow up : Dyspnea  Patient returns for a 35-month follow-up.  Patient was seen for pulmonary consult October 13, 2019 for chronic dyspnea that has been progressively getting worse over the last year.  She was set up for extensive lab work that showed an elevated D-dimer at 1.21.  Subsequent CT chest was done on October 14, 2019 that was negative for PE.  No evidence of interstitial lung disease.  And no acute pulmonary findings were noted.  There had been concern of possible pulmonary toxicity as she is on chronic Macrodantin however as noted the CT was okay.  Other lab work was unrevealing including normal alpha-1 and H&H.  Patient was set up for pulmonary function testing that shows normal lung function with FEV1 at 106%, ratio 84, FVC 94%, slightly decreased DLCO at 74%.  She did not take any inhalers prior to her PFT.  Last visit patient was started on Trelegy.  Since last visit patient says her breathing has been doing  about the same. Gets  Out of breath with inclines, stairs and prolonged walking . Has Min cough , increased GERD , chronic hoarseness and throat clearing for several months. Says she could not tolerate Trelegy, felt worse and made hoarseness and throat clearing much worse.  On ACE Inhibitor for long time.     Allergies  Allergen Reactions  . Naprosyn [Naproxen] Rash    Immunization History  Administered Date(s) Administered  . Fluad Quad(high Dose 65+) 04/12/2019  . Influenza Whole 04/09/2011  . Influenza, High Dose Seasonal PF 05/08/2014, 05/01/2015, 05/26/2016, 05/22/2017, 06/23/2018  . Influenza,inj,Quad PF,6+ Mos 04/14/2013  . Influenza-Unspecified 05/22/2017  . PFIZER SARS-COV-2 Vaccination 09/15/2019, 10/10/2019  . Pneumococcal Conjugate-13 11/01/2014, 05/11/2018  . Pneumococcal Polysaccharide-23 04/09/2011  . Td 08/07/2006  . Tdap 11/01/2014  . Zoster 10/25/2008  . Zoster Recombinat (Shingrix) 07/28/2019, 11/03/2019    Past Medical History:  Diagnosis Date  . Anemia   . Arthritis    knees  . Chronic constipation   . Colon polyp 06/2015   tubular adenoma (sigmoid)  . Diverticulosis    seen on colonoscopy 06/2015  . GERD (gastroesophageal reflux disease)    normal EGD 06/2015  . Heterozygous factor V Leiden mutation [facility]) dx 07/2013     hematologist--  dr Bernadene Bell ([facility])--  per note "Low Risk"  . History of DVT of lower extremity    dx 07-28-2013  left lower extrem.  --  xarelto for 6 months  . Hypertension   .  Lymphadenopathy, inguinal    bilateral  . Microscopic hematuria    chronic   . Multinodular goiter   . Multinodular thyroid    a. Path benign 2013.  [name] OAB (overactive bladder)   . Osteoporosis 2017   osteopenia since 2011; osteoporosis 12/2015  . Pre-diabetes    no meds, managaed with lifestyle changes   . Prediabetes   . SUI (stress urinary incontinence, female)   . Tricuspid regurgitation    mild-mod, noted on echo 03/2016     Tobacco History: Social History   Tobacco Use  Smoking Status Former Smoker  . Packs/day: 1.00  . Years: 21.00  . Pack years: 21.00  . Types: Cigarettes  . Quit date: 08/12/1983  . Years since quitting: 36.4  Smokeless Tobacco Never Used   Counseling given: Not Answered   Outpatient Medications Prior to Visit  Medication Sig Dispense Refill  . aspirin 81 MG chewable tablet Chew 81 mg by mouth daily.    [name] CALCIUM-MAGNESIUM-VITAMIN D PO Take 1 tablet by mouth daily.    . diphenhydrAMINE HCl (BENADRYL PO) Take 12.5 mg by mouth at bedtime.    . docusate sodium (COLACE) 100 MG capsule Take 1 capsule (100 mg total) by mouth 2 (two) times daily. 10 capsule 0  . esomeprazole (NEXIUM) 20 MG capsule Take 20 mg by mouth daily as needed (acid reflux).    . ibandronate (BONIVA) 150 MG tablet TAKE ONE TABLET ONCE A MONTH IN THE MORNING ON AN EMPTY STOMACH WITH WATER. REMAIN UPRIGHT. 3 tablet 0  . lisinopril-hydrochlorothiazide (ZESTORETIC) 20-12.5 MG tablet Take 1 tablet by mouth daily. 90 tablet 1  . Multiple Vitamins-Minerals (MULTIVITAMIN WITH MINERALS) tablet Take 1 tablet by mouth daily.    . nitrofurantoin (MACRODANTIN) 100 MG capsule Take 100 mg by mouth daily.    . polyethylene glycol (MIRALAX / GLYCOLAX) packet Take 17 g by mouth 2 (two) times daily. 14 each 0  . simvastatin (ZOCOR) 40 MG tablet TAKE ONE TABLET AT BEDTIME. 90 tablet 1  . SYNTHROID 25 MCG tablet TAKE 1 TABLET DAILY BEFORE BREAKFAST 90 tablet 1  . VITAMIN D PO Take 1,000 mg by mouth daily.    . Fluticasone-Umeclidin-Vilant (TRELEGY ELLIPTA) 200-62.5-25 MCG/INH AEPB Inhale 1 puff into the lungs daily. 14 each 0  . Fluticasone-Umeclidin-Vilant (TRELEGY ELLIPTA) 200-62.5-25 MCG/INH AEPB Inhale 1 puff into the lungs daily. (Patient not taking: Reported on 12/27/2019) 60 each 3   No facility-administered medications prior to visit.     Review of Systems:   Constitutional:   No  weight loss, night sweats,  Fevers,  chills, + fatigue, or  lassitude.  HEENT:   No headaches,  Difficulty swallowing,  Tooth/dental problems, or  Sore throat,                No sneezing, itching, ear ache, nasal congestion, post nasal drip,   CV:  No chest pain,  Orthopnea, PND, swelling in lower extremities, anasarca, dizziness, palpitations, syncope.   GI  No heartburn, indigestion, abdominal pain, nausea, vomiting, diarrhea, change in bowel habits, loss of appetite, bloody stools.   Resp:    No excess mucus, no productive cough,  No non-productive cough,  No coughing up of blood.  No change in color of mucus.  No wheezing.  No chest wall deformity  Skin: no rash or lesions.  GU: no dysuria, change in color of urine, no urgency or frequency.  No flank pain, no hematuria   MS:  No joint  pain or swelling.  No decreased range of motion.  No back pain.    Physical Exam  BP (!) 104/54 (BP Location: Left Arm, Cuff Size: Normal)   Pulse 82   Temp 97.6 F (36.4 C) (Temporal)   Ht 5\' 4"  (1.626 m)   Wt 137 lb 9.6 oz (62.4 kg)   LMP  (LMP Unknown)   SpO2 100% Comment: RA  BMI 23.62 kg/m   GEN: A/Ox3; pleasant , NAD, well nourished    HEENT:  [place]/AT,   NOSE-clear, THROAT-clear, no lesions, no postnasal drip or exudate noted.   NECK:  Supple w/ fair ROM; no JVD; normal carotid impulses w/o bruits; no thyromegaly or nodules palpated; no lymphadenopathy.    RESP  Clear  P & A; w/o, wheezes/ rales/ or rhonchi. no accessory muscle use, no dullness to percussion  CARD:  RRR, no m/r/g, no peripheral edema, pulses intact, no cyanosis or clubbing.  GI:   Soft & nt; nml bowel sounds; no organomegaly or masses detected.   Musco: Warm bil, no deformities or joint swelling noted.   Neuro: alert, no focal deficits noted.    Skin: Warm, no lesions or rashes    Lab Results:  CBC    Component Value Date/Time   WBC 6.5 10/13/2019 1030   RBC 3.56 (L) 10/13/2019 1030   HGB 12.2 10/13/2019 1030   HGB 12.5 01/10/2019 0830    HGB 11.8 04/28/2014 0814   HCT 35.6 (L) 10/13/2019 1030   HCT 36.3 01/10/2019 0830   HCT 36.2 04/28/2014 0814   PLT 199.0 10/13/2019 1030   PLT 232 01/10/2019 0830   MCV 100.1 (H) 10/13/2019 1030   MCV 99 (H) 01/10/2019 0830   MCV 101.4 (H) 04/28/2014 0814   MCH 34.1 (H) 01/10/2019 0830   MCH 35.0 (H) 09/22/2017 0543   MCHC 34.3 10/13/2019 1030   RDW 13.0 10/13/2019 1030   RDW 13.4 01/10/2019 0830   RDW 13.2 04/28/2014 0814   LYMPHSABS 2.5 10/13/2019 1030   LYMPHSABS 2.7 01/10/2019 0830   LYMPHSABS 2.7 04/28/2014 0814   MONOABS 0.8 10/13/2019 1030   MONOABS 0.9 04/28/2014 0814   EOSABS 0.3 10/13/2019 1030   EOSABS 0.2 01/10/2019 0830   BASOSABS 0.1 10/13/2019 1030   BASOSABS 0.0 01/10/2019 0830   BASOSABS 0.0 04/28/2014 0814    BMET    Component Value Date/Time   NA 141 10/14/2019 0844   NA 141 07/18/2019 1121   NA 144 01/26/2014 0809   K 3.8 10/14/2019 0844   K 4.6 01/26/2014 0809   CL 104 10/14/2019 0844   CO2 30 10/14/2019 0844   CO2 29 01/26/2014 0809   GLUCOSE 92 10/14/2019 0844   GLUCOSE 94 01/26/2014 0809   BUN 14 10/14/2019 0844   BUN 15 07/18/2019 1121   BUN 18.5 01/26/2014 0809   CREATININE 0.84 10/14/2019 0844   CREATININE 0.92 12/04/2016 0713   CREATININE 0.8 01/26/2014 0809   CALCIUM 9.5 10/14/2019 0844   CALCIUM 9.4 01/26/2014 0809   GFRNONAA 68 07/18/2019 1121   GFRAA 79 07/18/2019 1121    BNP No results found for: BNP  ProBNP    Component Value Date/Time   PROBNP 23.0 10/13/2019 1030    Imaging: No results found.    PFT Results Latest Ref Rng & Units 12/27/2019  FVC-Pre L 2.77  FVC-Predicted Pre % 94  FVC-Post L 2.63  FVC-Predicted Post % 90  Pre FEV1/FVC % % 84  Post FEV1/FCV % % 88  FEV1-Pre L 2.34  FEV1-Predicted Pre % 106  FEV1-Post L 2.30  DLCO UNC% % 74  DLCO COR %Predicted % 81  TLC L 4.50  TLC % Predicted % 88  RV % Predicted % 72    No results found for: NITRICOXIDE      Assessment & Plan:   No  problem-specific Assessment & Plan notes found for this encounter.     Rexene Edison, NP 12/27/2019

## 2020-01-02 NOTE — Telephone Encounter (Signed)
Please schedule OV with me. Elective, Dyspnea on exertion

## 2020-01-04 NOTE — Progress Notes (Signed)
Primary Physician/Referring:  [name], MD  Patient ID: Dorothy Rojas, female    DOB: [birth year], 74 y.o.   MRN: [id number]  Chief Complaint  Patient presents with  . Shortness of Breath  . Follow-up   HPI:    Dorothy Rojas  is a 74 y.o. Caucasian female with chronic dyspnea on exertion, prior history of tobacco use disorder approximately 15 to 20 pack year history quit in 1986, hypertension, hyperlipidemia, hyperglycemia and a family history of premature coronary artery disease, history of DVT and factor V Leyden positive in 2014 presents here for follow-up of worsening dyspnea on exertion.  She had pulmonary evaluation performed recently and has had no pulmonary issues to explain her symptoms and no PE on CT scan in 2021.  She has been concerned about ongoing dyspnea.  No chest pain.  Past Medical History:  Diagnosis Date  . Anemia   . Arthritis    knees  . Chronic constipation   . Colon polyp 06/2015   tubular adenoma (sigmoid)  . Diverticulosis    seen on colonoscopy 06/2015  . GERD (gastroesophageal reflux disease)    normal EGD 06/2015  . Heterozygous factor V Leiden mutation [facility]) dx 07/2013     hematologist--  dr Bernadene Bell ([facility])--  per note "Low Risk"  . History of DVT of lower extremity    dx 07-28-2013  left lower extrem.  --  xarelto for 6 months  . Hypertension   . Lymphadenopathy, inguinal    bilateral  . Microscopic hematuria    chronic   . Multinodular goiter   . Multinodular thyroid    a. Path benign 2013.  [name] OAB (overactive bladder)   . Osteoporosis 2017   osteopenia since 2011; osteoporosis 12/2015  . Pre-diabetes    no meds, managaed with lifestyle changes   . Prediabetes   . SUI (stress urinary incontinence, female)   . Tricuspid regurgitation    mild-mod, noted on echo 03/2016   Past Surgical History:  Procedure Laterality Date  . APPENDECTOMY  1973  . CATARACT EXTRACTION W/ INTRAOCULAR LENS  IMPLANT, BILATERAL  Bilateral 2015  . CYSTOCELE REPAIR  12-03-2010  . CYSTOSCOPY MACROPLASTIQUE IMPLANT N/A 12/12/2014   Procedure: CYSTOSCOPY MACROPLASTIQUE INJECTION;  Surgeon: Bjorn Loser, MD;  Location: [facility];  Service: Urology;  Laterality: N/A;  . CYSTOSCOPY MACROPLASTIQUE IMPLANT N/A 04/12/2015   Procedure: CYSTOSCOPY MACROPLASTIQUE IMPLANT;  Surgeon: Bjorn Loser, MD;  Location: [facility];  Service: Urology;  Laterality: N/A;  . INCONTINENCE SURGERY  04/1999   Re-do at [facility] for sling complications in 0321  . KNEE ARTHROSCOPY Right 04-05-2013  . LATERAL EPICONDYLE RELEASE Right   . NEUROMA SURGERY Bilateral 1980's    feet  . OVARIAN CYST REMOVAL Left 1973  . PUBOVAGINAL SLING N/A 07/30/2015   Procedure: PELVIC EXAM UNDER ANESTHESIA, TRANSVAGINAL BIOPSY OF PERIURETHRAL MASS;  Surgeon: Raynelle Bring, MD;  Location: [facility];  Service: Urology;  Laterality: N/A;  75 MINS   . TONSILLECTOMY  as child  . TOTAL ABDOMINAL HYSTERECTOMY W/ BILATERAL SALPINGOOPHORECTOMY  09/  2000  . TOTAL KNEE ARTHROPLASTY Left 09/21/2017   Procedure: LEFT TOTAL KNEE ARTHROPLASTY;  Surgeon: Paralee Cancel, MD;  Location: [facility];  Service: Orthopedics;  Laterality: Left;  90 mins  . TRANSTHORACIC ECHOCARDIOGRAM  05-04-2014   normal ,  ef 60-65%   Family History  Problem Relation Age of Onset  . Hypertension Father   . Heart disease  Father 86       CABG, died from MI at 62  . COPD Father   . Cirrhosis Mother        related to psoriasis medications  . Diabetes Mother   . Psoriasis Mother   . Osteogenesis imperfecta Sister   . Heart disease Sister        CHF  . Diabetes Brother   . Heart disease Brother 20       MI  . Deep vein thrombosis Brother        factor V Leiden NEGATIVE  . Cancer Brother 81       bile duct and liver    Social History   Tobacco Use  . Smoking status: Former Smoker    Packs/day: 1.00    Years: 21.00    Pack years: 21.00    Types: Cigarettes     Quit date: 08/12/1983    Years since quitting: 36.4  . Smokeless tobacco: Never Used  Substance Use Topics  . Alcohol use: Yes    Alcohol/week: 0.0 standard drinks    Comment: 1 glass of wine most days of the week (4oz)   Marital Status: Married ROS  Review of Systems  Cardiovascular: Positive for dyspnea on exertion (chronic and stable). Negative for leg swelling and syncope.  Musculoskeletal: Positive for arthritis (knee).  Gastrointestinal: Negative for hematochezia and melena.   Objective  Blood pressure 136/65, pulse 84, resp. rate 17, height '5\' 4"'$  (1.626 m), weight 137 lb (62.1 kg), SpO2 98 %.  Vitals with BMI 01/05/2020 12/27/2019 10/13/2019  Height '5\' 4"'$  '5\' 4"'$  '5\' 4"'$   Weight 137 lbs 137 lbs 10 oz 138 lbs 13 oz  BMI 23.5 33.54 56.25  Systolic 638 937 342  Diastolic 65 54 80  Pulse 84 82 78     Physical Exam  Neck: No thyromegaly present.  Cardiovascular: Normal rate, regular rhythm, normal heart sounds and intact distal pulses. Exam reveals no gallop.  No murmur heard. No leg edema, no JVD.  Pulmonary/Chest: Effort normal and breath sounds normal.  Abdominal: Soft. Bowel sounds are normal.  Musculoskeletal:     Cervical back: Neck supple.  Skin: Skin is warm and dry.   Laboratory examination:   Recent Labs    01/10/19 0830 01/10/19 0830 07/18/19 1121 10/13/19 1030 10/14/19 0844  NA 140   < > 141 140 141  K 5.1   < > 4.4 4.1 3.8  CL 100   < > 101 101 104  CO2 25   < > '25 31 30  '$ GLUCOSE 102*   < > 93 95 92  BUN 20   < > '15 14 14  '$ CREATININE 0.81   < > 0.85 0.85 0.84  CALCIUM 9.7   < > 9.7 9.9 9.5  GFRNONAA 73  --  68  --   --   GFRAA 84  --  79  --   --    < > = values in this interval not displayed.   CrCl cannot be calculated (Patient's most recent lab result is older than the maximum 21 days allowed.).  CMP Latest Ref Rng & Units 10/14/2019 10/13/2019 07/18/2019  Glucose 70 - 99 mg/dL 92 95 93  BUN 6 - 23 mg/dL '14 14 15  '$ Creatinine 0.40 - 1.20 mg/dL 0.84 0.85  0.85  Sodium 135 - 145 mEq/L 141 140 141  Potassium 3.5 - 5.1 mEq/L 3.8 4.1 4.4  Chloride 96 - 112 mEq/L 104 101  101  CO2 19 - 32 mEq/L '30 31 25  '$ Calcium 8.4 - 10.5 mg/dL 9.5 9.9 9.7  Total Protein 6.0 - 8.3 g/dL - 7.5 6.8  Total Bilirubin 0.2 - 1.2 mg/dL - 0.5 0.4  Alkaline Phos 39 - 117 U/L - 41 47  AST 0 - 37 U/L - 59(H) 43(H)  ALT 0 - 35 U/L - 45(H) 33(H)   CBC Latest Ref Rng & Units 10/13/2019 01/10/2019 12/21/2017  WBC 4.0 - 10.5 K/uL 6.5 6.2 7.4  Hemoglobin 12.0 - 15.0 g/dL 12.2 12.5 11.8  Hematocrit 36.0 - 46.0 % 35.6(L) 36.3 34.5  Platelets 150.0 - 400.0 K/uL 199.0 232 239   Lipid Panel     Component Value Date/Time   CHOL 142 01/10/2019 0830   TRIG 150 (H) 01/10/2019 0830   HDL 56 01/10/2019 0830   CHOLHDL 2.5 01/10/2019 0830   CHOLHDL 3.0 06/11/2017 0851   VLDL 37 (H) 12/04/2016 0713   LDLCALC 56 01/10/2019 0830   LDLCALC 72 06/11/2017 0851   HEMOGLOBIN A1C Lab Results  Component Value Date   HGBA1C 5.7 (H) 01/10/2019   MPG 128.37 09/15/2017   TSH Recent Labs    01/10/19 0830 03/03/19 0901 07/18/19 1121  TSH 6.200* 3.840 3.810   Medications and allergies   Allergies  Allergen Reactions  . Naprosyn [Naproxen] Rash     Current Outpatient Medications  Medication Instructions  . aspirin 81 mg, Oral, Daily  . CALCIUM-MAGNESIUM-VITAMIN D PO 1 tablet, Oral, Daily  . diphenhydrAMINE HCl (BENADRYL PO) 12.5 mg, Oral, Daily at bedtime  . docusate sodium (COLACE) 100 mg, Oral, 2 times daily  . ibandronate (BONIVA) 150 MG tablet TAKE ONE TABLET ONCE A MONTH IN THE MORNING ON AN EMPTY STOMACH WITH WATER. REMAIN UPRIGHT.  [name] lisinopril-hydrochlorothiazide (ZESTORETIC) 20-12.5 MG tablet 1 tablet, Oral, Daily  . Multiple Vitamins-Minerals (MULTIVITAMIN WITH MINERALS) tablet 1 tablet, Oral, Daily  . nitrofurantoin (MACRODANTIN) 100 mg, Oral, Daily  . polyethylene glycol (MIRALAX / GLYCOLAX) 17 g, Oral, 2 times daily  . simvastatin (ZOCOR) 40 MG tablet TAKE ONE TABLET  AT BEDTIME.  . SYNTHROID 25 MCG tablet TAKE 1 TABLET DAILY BEFORE BREAKFAST  . VITAMIN D PO 1,000 mg, Oral, Daily    Radiology:   Cardiac CTA 05/04/2014: Calcium score 0, no plaque, normal coronary arteries, right dominant circulation.  CT Angio Chest 10/14/2019: 1. No CT findings for pulmonary embolism. 2. Thoracic aortic calcifications but no dissection or aneurysm. 3. No acute pulmonary findings. Aortic Atherosclerosis   Cardiac Studies:   Echocardiogram 04/09/2016: Left ventricle cavity is normal in size. Normal global wall motion. Normal diastolic filling pattern. Calculated EF 69%. Trace mitral regurgitation. Mild to moderate tricuspid regurgitation. No evidence of pulmonary hypertension. PA systolic pressure estimated at 29 mm Hg.  Exercise myoview stress 03/31/2016: 1. Resting EKG demonstrates normal sinus rhythm, borderline criteria for LVH. Stress EKG is positive for myocardial ischemia, patient developed 3 mm downsloping ST segment depression which persisted for greater than 3 minutes into recovery. Stress symptoms included shortness of breath. Patient exercised on Bruce protocol for 5 minutes and achieved 7.02 MET has, stress terminated due to dyspnea and achieving 93% of MPHR. 2. The perfusion imaging study demonstrates mild breast attenuation artifact in the inferior wall, patient scan sitting. There is no definite evidence of ischemia or scar. Left ventricle is normal in size in both rest and stress images, LVEF calculated at 87%. This is a low risk study, clinical correlation recommended.  Echocardiogram 08/19/2019:  Left ventricle cavity is normal in size. Mild concentric hypertrophy of the left ventricle. Normal global wall motion. Normal LV systolic function  with EF 62%. Normal diastolic filling pattern.  Mild (Grade I) mitral regurgitation.  Mild to moderate tricuspid regurgitation. Estimated pulmonary artery systolic pressure is 26 mmHg.  No significant change  compared to previous study in 2017.   EKG   09/14/2019: Normal sinus rhythm at rate of 81 bpm,  nonspecific inferior and lateral 2 mm sagging ST depressions. Compared to  EKG 03/26/2016, ST changes new.   Assessment     ICD-10-CM   1. Dyspnea on exertion  R06.00 PCV ECHOCARDIOGRAM COMPLETE    PCV MYOCARDIAL PERFUSION WO LEXISCAN  2. Nonspecific abnormal electrocardiogram (ECG) (EKG)  R94.31 PCV MYOCARDIAL PERFUSION WO LEXISCAN  3. Essential hypertension  I10 PCV MYOCARDIAL PERFUSION WO LEXISCAN  4. Family history of early CAD: Father with MI at 69 and brother MI at 25.  Z82.49   5. Hypercholesteremia  E78.00 PCV MYOCARDIAL PERFUSION WO LEXISCAN  6. Heterozygous factor V Leiden mutation ([place]) and left leg DVT 2014  D68.51   7. Moderate tricuspid regurgitation  I07.1 PCV ECHOCARDIOGRAM COMPLETE  8. Hyperglycemia  R73.9 PCV MYOCARDIAL PERFUSION WO LEXISCAN      No orders of the defined types were placed in this encounter.   Medications Discontinued During This Encounter  Medication Reason  . esomeprazole (NEXIUM) 20 MG capsule Patient Preference    Recommendations:   Dorothy Rojas  is a 74 y.o. Caucasian female with chronic dyspnea on exertion, prior history of tobacco use disorder approximately 15 to 20 pack year history quit in 1986, hypertension, hyperlipidemia, hyperglycemia and a family history of premature coronary artery disease presents here for evaluation of worsening dyspnea on exertion, was evaluated by pulmonary medicine.   Chronic dyspnea with exertion questionable etiology.  Work-up has been unrevealing. Patient had a negative CT chest.  No evidence of PE or interstitial lung disease.  Lab work was unrevealing with normal CBC and alpha-1 testing.  Pulmonary function testing shows no evidence of COPD.  No airflow obstruction or restriction.  Her DLCO was slightly decreased.   In view of multiple cardiovascular risk factors, persistent symptoms and negative pulmonary  evaluation, I will set her up for nuclear stress test, she also has abnormal EKG.  If it is abnormal, she will need both right and left heart catheterization.  Otherwise I would like to see her back in 6 months and I will repeat echocardiogram specifically to follow-up on tricuspid regurgitation and evidence of pulmonary hypertension.  In view of persistent symptoms of dyspnea, she has had DVT and factor V Leyden positive gene and this was diagnosed in 2014.  I may even consider a VQ scan to complete work-up.   Adrian Prows, MD, [facility] 01/05/2020, 10:09 AM [facility] Pager: [phone] Office: [phone]

## 2020-01-05 ENCOUNTER — Other Ambulatory Visit: Payer: Self-pay

## 2020-01-05 ENCOUNTER — Encounter: Payer: Self-pay | Admitting: Cardiology

## 2020-01-05 ENCOUNTER — Ambulatory Visit: Payer: Medicare Other | Admitting: Cardiology

## 2020-01-05 VITALS — BP 136/65 | HR 84 | Resp 17 | Ht 64.0 in | Wt 137.0 lb

## 2020-01-05 DIAGNOSIS — R9431 Abnormal electrocardiogram [ECG] [EKG]: Secondary | ICD-10-CM | POA: Diagnosis not present

## 2020-01-05 DIAGNOSIS — R739 Hyperglycemia, unspecified: Secondary | ICD-10-CM

## 2020-01-05 DIAGNOSIS — I071 Rheumatic tricuspid insufficiency: Secondary | ICD-10-CM | POA: Diagnosis not present

## 2020-01-05 DIAGNOSIS — D6851 Activated protein C resistance: Secondary | ICD-10-CM | POA: Diagnosis not present

## 2020-01-05 DIAGNOSIS — E7800 Pure hypercholesterolemia, unspecified: Secondary | ICD-10-CM

## 2020-01-05 DIAGNOSIS — R0609 Other forms of dyspnea: Secondary | ICD-10-CM | POA: Diagnosis not present

## 2020-01-05 DIAGNOSIS — I10 Essential (primary) hypertension: Secondary | ICD-10-CM | POA: Diagnosis not present

## 2020-01-05 DIAGNOSIS — Z8249 Family history of ischemic heart disease and other diseases of the circulatory system: Secondary | ICD-10-CM

## 2020-01-17 ENCOUNTER — Encounter: Payer: Self-pay | Admitting: Family Medicine

## 2020-01-23 ENCOUNTER — Other Ambulatory Visit: Payer: Medicare Other

## 2020-01-23 ENCOUNTER — Encounter: Payer: Self-pay | Admitting: Family Medicine

## 2020-01-24 ENCOUNTER — Other Ambulatory Visit: Payer: Self-pay

## 2020-01-24 ENCOUNTER — Other Ambulatory Visit: Payer: Medicare Other

## 2020-01-24 DIAGNOSIS — R7301 Impaired fasting glucose: Secondary | ICD-10-CM | POA: Diagnosis not present

## 2020-01-24 DIAGNOSIS — E039 Hypothyroidism, unspecified: Secondary | ICD-10-CM

## 2020-01-24 DIAGNOSIS — Z1231 Encounter for screening mammogram for malignant neoplasm of breast: Secondary | ICD-10-CM

## 2020-01-24 DIAGNOSIS — I10 Essential (primary) hypertension: Secondary | ICD-10-CM | POA: Diagnosis not present

## 2020-01-24 DIAGNOSIS — E7800 Pure hypercholesterolemia, unspecified: Secondary | ICD-10-CM

## 2020-01-24 DIAGNOSIS — Z5181 Encounter for therapeutic drug level monitoring: Secondary | ICD-10-CM | POA: Diagnosis not present

## 2020-01-25 ENCOUNTER — Ambulatory Visit: Payer: Medicare Other

## 2020-01-25 DIAGNOSIS — R739 Hyperglycemia, unspecified: Secondary | ICD-10-CM

## 2020-01-25 DIAGNOSIS — E7800 Pure hypercholesterolemia, unspecified: Secondary | ICD-10-CM | POA: Diagnosis not present

## 2020-01-25 DIAGNOSIS — R0609 Other forms of dyspnea: Secondary | ICD-10-CM | POA: Diagnosis not present

## 2020-01-25 DIAGNOSIS — R9431 Abnormal electrocardiogram [ECG] [EKG]: Secondary | ICD-10-CM

## 2020-01-25 DIAGNOSIS — I10 Essential (primary) hypertension: Secondary | ICD-10-CM | POA: Diagnosis not present

## 2020-01-25 LAB — CBC WITH DIFFERENTIAL/PLATELET
Basophils Absolute: 0.1 10*3/uL (ref 0.0–0.2)
Basos: 1 %
EOS (ABSOLUTE): 0.3 10*3/uL (ref 0.0–0.4)
Eos: 4 %
Hematocrit: 38.7 % (ref 34.0–46.6)
Hemoglobin: 12.6 g/dL (ref 11.1–15.9)
Immature Grans (Abs): 0 10*3/uL (ref 0.0–0.1)
Immature Granulocytes: 0 %
Lymphocytes Absolute: 2.7 10*3/uL (ref 0.7–3.1)
Lymphs: 41 %
MCH: 33.3 pg — ABNORMAL HIGH (ref 26.6–33.0)
MCHC: 32.6 g/dL (ref 31.5–35.7)
MCV: 102 fL — ABNORMAL HIGH (ref 79–97)
Monocytes Absolute: 0.5 10*3/uL (ref 0.1–0.9)
Monocytes: 8 %
Neutrophils Absolute: 3.1 10*3/uL (ref 1.4–7.0)
Neutrophils: 46 %
Platelets: 230 10*3/uL (ref 150–450)
RBC: 3.78 x10E6/uL (ref 3.77–5.28)
RDW: 13 % (ref 11.7–15.4)
WBC: 6.7 10*3/uL (ref 3.4–10.8)

## 2020-01-25 LAB — COMPREHENSIVE METABOLIC PANEL
ALT: 58 IU/L — ABNORMAL HIGH (ref 0–32)
AST: 78 IU/L — ABNORMAL HIGH (ref 0–40)
Albumin/Globulin Ratio: 1.8 (ref 1.2–2.2)
Albumin: 4.5 g/dL (ref 3.7–4.7)
Alkaline Phosphatase: 49 IU/L (ref 48–121)
BUN/Creatinine Ratio: 15 (ref 12–28)
BUN: 14 mg/dL (ref 8–27)
Bilirubin Total: 0.6 mg/dL (ref 0.0–1.2)
CO2: 25 mmol/L (ref 20–29)
Calcium: 9.9 mg/dL (ref 8.7–10.3)
Chloride: 101 mmol/L (ref 96–106)
Creatinine, Ser: 0.93 mg/dL (ref 0.57–1.00)
GFR calc Af Amer: 71 mL/min/{1.73_m2} (ref >59)
GFR calc non Af Amer: 61 mL/min/{1.73_m2} (ref >59)
Globulin, Total: 2.5 g/dL (ref 1.5–4.5)
Glucose: 103 mg/dL — ABNORMAL HIGH (ref 65–99)
Potassium: 4.8 mmol/L (ref 3.5–5.2)
Sodium: 141 mmol/L (ref 134–144)
Total Protein: 7 g/dL (ref 6.0–8.5)

## 2020-01-25 LAB — TSH: TSH: 4.78 u[IU]/mL — ABNORMAL HIGH (ref 0.450–4.500)

## 2020-01-25 LAB — HEMOGLOBIN A1C
Est. average glucose Bld gHb Est-mCnc: 126 mg/dL
Hgb A1c MFr Bld: 6 % — ABNORMAL HIGH (ref 4.8–5.6)

## 2020-01-25 LAB — LIPID PANEL
Chol/HDL Ratio: 2.9 ratio (ref 0.0–4.4)
Cholesterol, Total: 135 mg/dL (ref 100–199)
HDL: 47 mg/dL (ref >39)
LDL Chol Calc (NIH): 59 mg/dL (ref 0–99)
Triglycerides: 177 mg/dL — ABNORMAL HIGH (ref 0–149)
VLDL Cholesterol Cal: 29 mg/dL (ref 5–40)

## 2020-01-25 NOTE — Patient Instructions (Addendum)
  HEALTH MAINTENANCE RECOMMENDATIONS:  It is recommended that you get at least 30 minutes of aerobic exercise at least 5 days/week (for weight loss, you may need as much as 60-90 minutes). This can be any activity that gets your heart rate up. This can be divided in 10-15 minute intervals if needed, but try and build up your endurance at least once a week.  Weight bearing exercise is also recommended twice weekly.  Eat a healthy diet with lots of vegetables, fruits and fiber.  "Colorful" foods have a lot of vitamins (ie green vegetables, tomatoes, red peppers, etc).  Limit sweet tea, regular sodas and alcoholic beverages, all of which has a lot of calories and sugar.  Up to 1 alcoholic drink daily may be beneficial for women (unless trying to lose weight, watch sugars).  Drink a lot of water.  Calcium recommendations are 1200-1500 mg daily (1500 mg for postmenopausal women or women without ovaries), and vitamin D 1000 IU daily.  This should be obtained from diet and/or supplements (vitamins), and calcium should not be taken all at once, but in divided doses.  Monthly self breast exams and yearly mammograms for women over the age of 80 is recommended.  Sunscreen of at least SPF 30 should be used on all sun-exposed parts of the skin when outside between the hours of 10 am and 4 pm (not just when at beach or pool, but even with exercise, golf, tennis, and yard work!)  Use a sunscreen that says "broad spectrum" so it covers both UVA and UVB rays, and make sure to reapply every 1-2 hours.  Remember to change the batteries in your smoke detectors when changing your clock times in the spring and fall. Carbon monoxide detectors are recommended for your home.  Use your seat belt every time you are in a car, and please drive safely and not be distracted with cell phones and texting while driving.   Ms. Cogliano , Thank you for taking time to come for your Medicare Wellness Visit. I appreciate your ongoing  commitment to your health goals. Please review the following plan we discussed and let me know if I can assist you in the future.   This is a list of the screening recommended for you and due dates:  Health Maintenance  Topic Date Due  . Flu Shot  03/11/2020  . Colon Cancer Screening  06/20/2020  . Mammogram  01/25/2020  . Tetanus Vaccine  10/31/2024  . DEXA scan (bone density measurement)  Completed  . COVID-19 Vaccine  Completed  .  Hepatitis C: One time screening is recommended by Center for Disease Control  (CDC) for  adults born from [birth year] through 1965.   Completed  . Pneumonia vaccines  Completed   Continue yearly mammograms, as scheduled. Continue yearly flu shots (in the Fall, not 8/1 as stated above)  We are increasing the synthroid dose to 1mcg--double up on the 58mcg dose that you have now, until you get the new prescription.  We are referring you for thyroid and liver ultrasounds.  Consider trying daily Nexium for 2 weeks (if your heart study is okay, especially), to see if reflux could at all be affecting your breathing.

## 2020-01-25 NOTE — Progress Notes (Signed)
Chief Complaint  Patient presents with  . Medicare Wellness    nonfasting AWV. Has not missed any thyroid med dosage and is taking correctly. Had nuclear stress test yesterday. Has some SOB and wonders if there could be any reason for this unlrelated to the lungs or heart.     Dorothy Rojas is a 74 y.o. female who presents for annual wellness visit and follow-up on chronic medical conditions.  She had labs done prior to her visit, see below.  Hypertension follow-up:Compliant with lisinopril HCT, denies side effects. Blood pressures at home are running 120-130/60-70. Deniesheadaches or lightheadedness, syncope, chest pain. Denies side effects of medications. She denies exertional chest pain. She continues to have DOE--see below. Getting winded going up and down the stairs in her house She gets intermittent mild vertigo (when sitting up in bed, sitting up from getting hair washed, very short-lived)--short lived(seconds), not worsening or increasing in frequencyor duration (slightly worse when allergies or flaring).  She last saw Dr. Einar Gip last month for f/u on DOE, and also mild carotid atherosclerosis.  Her work-up has included negative CT chest, without e/o PE or interstitial lung disease. Normal labs (no anemia), normal PFT's without e/o COPD. Given her CV risk factors and persistent DOE with negative pulmonary w/u, he has set her up for a nuclear stress test. If abnormal, then will need cath.  If okay, f/u in 6 mos for repeat echo (to f/u on TR and pulm HTN). She had scan done yesterday, not yet resulted.  GERD: Has some reflux related to her diet.  Occasionally has symptoms related to [place] food, and Nexium OTC works well. Denies dysphagia.  Hyperlipidemia follow-up: Patient is reportedly following a low-fat, low cholesterol diet. Compliant with medications (simvastatin 40mg )and denies medication side effects.  Impaired fasting glucose.  She has had mildly elevated fasting  sugars.  Most recent A1c's have been 5.7, but had gotten up to 6.1 in 09/2017. She continues to avoid sugar and limit carbs in her diet. 2 dark chocolate candy kisses after lunch or dinner, 1 glass of red wine daily.  Tries to limit carbs.   H/o DVT-- s/p6 months of anticoagulation with xarelto.She was discharged from the care of hematologist. If any recurrence, will need lifelong treatment.  She is taking 81mg  of ASA daily. (factor V Leiden heterozygous). No other blood thinning agents were recommended.  Denies any swelling or pain.Denies any bleeding/bruising.  MNG--denies symptoms/dysphagia.She no longer gets the L-sided neck pain that she previously described. She had [place] last year (01/2019) which showed: IMPRESSION: 1. Thyroid nodules. 2. Nodule #2 (in the mid right thyroid lobe and involving the lateral aspect of the isthmus) has slightly enlarged in size. This is a TR 4 nodule and meets criteria for ultrasound-guided biopsy. 3. Concern for a nodule in the left inferior thyroid nodule versus a pseudo nodule. Assuming this is a true nodule, this is a TR 4 nodule and meets criteria for ultrasound-guided biopsy.  Biopsies of 2 of the nodules were benign.  Due for repeat US.  Hypothyroidism:  She was started on 69mcg Synthroid last year due to elevated TSH (TSH 6.2 in 01/2019), dry skin, cold intolerance. Last check was 3.810 on same dose in 07/2019. She denies changes to hair/skin/bowels/energy, weight. Constipation resolved when she started treatment.  She reports compliance with medication, taking on an empty stomach, separate from other medications.  She has chronic microscopic hematuria, under the care ofDr. McDiarmid. She takesnitrofurantoinonce daily for UTI prevention(switched fromTrimethoprimafter getting  break-through infections), and is doing well without recurrent infections. She is s/p Macroplastique procedure x 2, last of which was in September 2016. She had periurethral  biopsy for abnormal finding on CT, which showed giant cells, related to the macroplastique, benign. She had good results from macroplastique.Urinary incontinence is overall pretty well controlled. Notices it when she wakes up in the morning and stands up--leaks prior to getting to the bathroom. Sometimes also happens when getting up from the recliner. No problems during the day, sometimes with laughing/coughing (infrequent).  Osteoporosis: She was started on Boniva in May 2017, after her DEXA done 12/10/15 showed T -2.5 at R fem neck. She is tolerating this without side effects. Denies dysphagia or chest pain. Takes Ca+D once daily. She had DEXA done 01/2018, with T-2.3 at R fem neck (was -2.5 in 2017).She is due for repeat, and has it scheduled.   Immunization History  Administered Date(s) Administered  . Fluad Quad(high Dose 65+) 04/12/2019  . Influenza Whole 04/09/2011  . Influenza, High Dose Seasonal PF 05/08/2014, 05/01/2015, 05/26/2016, 05/22/2017, 06/23/2018  . Influenza,inj,Quad PF,6+ Mos 04/14/2013  . Influenza-Unspecified 05/22/2017  . PFIZER SARS-COV-2 Vaccination 09/15/2019, 10/10/2019  . Pneumococcal Conjugate-13 11/01/2014, 05/11/2018  . Pneumococcal Polysaccharide-23 04/09/2011  . Td 08/07/2006  . Tdap 11/01/2014  . Zoster 10/25/2008  . Zoster Recombinat (Shingrix) 07/28/2019, 11/03/2019   Last Pap smear: can't recall. S/p hysterectomy for benign reasons  Last mammogram:12/2017, scheduled again for later this month. Last colonoscopy: 06/2015 (along with EGD) with Dr. Henrene Pastor. Had adenomatous polyp, due again in 06/2020 Last DEXA: 01/2018 T-2.3 at R femur neck (had been -2.5 in5/2017); scheduled again for later this month Ophtho: yearly, in July Dentist: regularly, with dentist and periodontist every 3-4 months  Exercise: walking 30 minutes daily--due to DOE, has been slower, and does it 15 min twice daily. Just signed back up for Silver Sneakers through [facility]  (awaiting cardiac eval)--had been doing SS program at [facility] prior to [place].   Other doctors caring for patient include: Dr. Einar Gip (cardiology) Dr. McDiarmid (urology)  Dr. Gershon Crane (ophtho)  Dr. Kathrine Cords) Dr. Conley Canal (dentist)  Dr. Juliann Mule (hematologist)--released from his care Dr. Alvan Dame (ortho-knee) Dr. Onnie Graham (ortho--shoulder) Dr. Wilhemina Bonito (dermatologist)   Depression screen: negative Fall Screen: None Functional Status screen: notable only for urinary incontinence, some decreased hearing, DOE. Sometimes forgets why she walked into a room, no significant memory concerns Mini-Cog screen:normal See epic for full screens  End of Life Discussion: Patient hasa living will and medical power of attorney   PMH, PSH, SH and FH were reviewed and updated  Outpatient Encounter Medications as of 01/26/2020  Medication Sig  . aspirin 81 MG chewable tablet Chew 81 mg by mouth daily.  [name] CALCIUM-MAGNESIUM-VITAMIN D PO Take 1 tablet by mouth daily.  . diphenhydrAMINE HCl (BENADRYL PO) Take 12.5 mg by mouth at bedtime.  . ibandronate (BONIVA) 150 MG tablet TAKE ONE TABLET ONCE A MONTH IN THE MORNING ON AN EMPTY STOMACH WITH WATER. REMAIN UPRIGHT.  [name] lisinopril-hydrochlorothiazide (ZESTORETIC) 20-12.5 MG tablet Take 1 tablet by mouth daily.  . Multiple Vitamins-Minerals (MULTIVITAMIN WITH MINERALS) tablet Take 1 tablet by mouth daily.  . nitrofurantoin (MACRODANTIN) 100 MG capsule Take 100 mg by mouth daily.  . simvastatin (ZOCOR) 40 MG tablet TAKE ONE TABLET AT BEDTIME.  [name] VITAMIN D PO Take 1,000 mg by mouth daily.  . [DISCONTINUED] SYNTHROID 25 MCG tablet TAKE 1 TABLET DAILY BEFORE BREAKFAST  . SYNTHROID 50 MCG tablet Take 1 tablet (  50 mcg total) by mouth daily before breakfast.  . [DISCONTINUED] docusate sodium (COLACE) 100 MG capsule Take 1 capsule (100 mg total) by mouth 2 (two) times daily.  . [DISCONTINUED] polyethylene glycol (MIRALAX / GLYCOLAX) packet Take 17 g by mouth 2  (two) times daily.   No facility-administered encounter medications on file as of 01/26/2020.   TAKING 25MCG SYNTHROID PRIOR TO TODAY's VISIT, not 81mcg.  Allergies  Allergen Reactions  . Naprosyn [Naproxen] Rash     ROS:The patient denies anorexia, fever, headaches, vision changes, ear pain, sore throat, breast concerns, chest pain, palpitations, syncope, cough, swelling, nausea, vomiting, diarrhea, abdominal pain, melena, hematochezia, hematuria, dysuria, vaginal bleeding, discharge, odor or itch, genital lesions, numbness, tingling, weakness, tremor, suspicious skin lesions, depression, anxiety, abnormal bleeding or enlarged lymph nodes.  Intermittent vertigo, usually only whenshe sits up quickly from [place]. Urinary incontinencestable Allergies aren't currently flaring. DOE, as reported in HPI. Some hearing loss/trouble, especially in crowds, restaurants, unchanged Denies trouble falling asleep, sometimes wakes up 2-3x/night. Denies unrefreshed sleep, no known snoring    PHYSICAL EXAM:  BP 130/60   Pulse 84   Temp 98.2 F (36.8 C) (Tympanic)   Ht 5\' 4"  (1.626 m)   Wt 138 lb (62.6 kg)   LMP  (LMP Unknown)   BMI 23.69 kg/m   Wt Readings from Last 3 Encounters:  01/26/20 138 lb (62.6 kg)  01/05/20 137 lb (62.1 kg)  12/27/19 137 lb 9.6 oz (62.4 kg)    General Appearance:  Alert, cooperative, no distress, appears stated age. Some throat-clearing noted during visit  Head:  Normocephalic, without obvious abnormality, atraumatic   Eyes:  PERRL, conjunctiva/corneas clear, EOM's intact, fundi benign   Ears:  Normal TM's and external ear canals   Nose:  Not examined, wearing mask due to COVID-19 pandemic  Throat:  Not examined, wearing mask due to COVID-19 pandemic   Neck:  Supple, no lymphadenopathy; thyroid: mild enlargement, no nodules; no tenderness/nodules; no carotid bruit or JVD   Back:  Spine nontender, no curvature, ROM  normal, no CVA tenderness.   Lungs:  Clear to auscultation bilaterally without wheezes, rales or ronchi; respirations unlabored   Chest Wall:  No tenderness or deformity   Heart:  Regular rate and rhythm, S1 and S2 normal, no murmur, rub or gallop   Breast Exam:  No tenderness, masses, or nipple discharge. Nipples are inverted bilaterally, L>R (chronic, per pt). No axillary lymphadenopathy.   Abdomen:  Soft, non-tender, nondistended, normoactive bowel sounds, no masses, no hepatosplenomegaly   Genitalia:  Normal external genitalia without lesions. BUS and vagina normal; Uterus and adnexa surgically absent. No masses palpable   Rectal:  Normal tone, no masses or tenderness; guaiac-negative stool. Slightly diminished sphincter tone, unchanged  Extremities:  No clubbing, cyanosis or edema.  Pulses:  2+ and symmetric all extremities   Skin:  Skin color, texture, turgor normal, no rashes or suspicious lesions. Many SK's.   Lymph nodes:  Cervical, supraclavicular, and axillary nodes normal   Neurologic:  CNII-XII intact, normal strength, sensation and gait; reflexes 2+ and symmetric throughout    Psych: Normal mood, affect, hygiene and grooming   Lab Results  Component Value Date   HGBA1C 6.0 (H) 01/24/2020   Fasting glucose 103    Chemistry      Component Value Date/Time   NA 141 01/24/2020 0828   NA 144 01/26/2014 0809   K 4.8 01/24/2020 0828   K 4.6 01/26/2014 0809   CL 101  01/24/2020 0828   CO2 25 01/24/2020 0828   CO2 29 01/26/2014 0809   BUN 14 01/24/2020 0828   BUN 18.5 01/26/2014 0809   CREATININE 0.93 01/24/2020 0828   CREATININE 0.92 12/04/2016 0713   CREATININE 0.8 01/26/2014 0809      Component Value Date/Time   CALCIUM 9.9 01/24/2020 0828   CALCIUM 9.4 01/26/2014 0809   ALKPHOS 49 01/24/2020 0828   ALKPHOS 57 10/13/2013 0910   AST 78 (H) 01/24/2020 0828   AST 21 10/13/2013 0910   ALT 58 (H)  01/24/2020 0828   ALT 23 10/13/2013 0910   BILITOT 0.6 01/24/2020 0828   BILITOT 0.42 10/13/2013 0910     Lab Results  Component Value Date   CHOL 135 01/24/2020   HDL 47 01/24/2020   LDLCALC 59 01/24/2020   TRIG 177 (H) 01/24/2020   CHOLHDL 2.9 01/24/2020   Lab Results  Component Value Date   WBC 6.7 01/24/2020   HGB 12.6 01/24/2020   HCT 38.7 01/24/2020   MCV 102 (H) 01/24/2020   PLT 230 01/24/2020   Lab Results  Component Value Date   TSH 4.780 (H) 01/24/2020     ASSESSMENT/PLAN:  Medicare annual wellness visit, subsequent  Multinodular goiter - no symptoms or change in size. Due for f/u US for surveillance of nodules (2 benign biopsies last year) - Plan: US THYROID  Elevated LFTs - Ddx reviewed. Will check [place].  Suspect component of fatty liver (prev noted), counseled re: ETOH, weight loss - Plan: US Abdomen Limited RUQ, Hepatic function panel, CANCELED: US Abdomen Limited  Hypothyroidism, unspecified type - TSH >4, will try dose of 70mcg.  recheck TSH in 6 weeks - Plan: SYNTHROID 50 MCG tablet, TSH  Pure hypercholesterolemia - LDL at goal, TG mildly elevated. Reviewed lowfat diet  Osteoporosis without current pathological fracture, unspecified osteoporosis type - on Boniva.  Scheduled for f/u DEXA. Discussed Ca, D, weight-bearing exercise  Essential hypertension, benign - controlled on current regimen  Heterozygous factor V Leiden mutation ([place]) - h/o DVT. Cont ASA, avoid estrogen-like meds  Tubular adenoma of colon - reminded that she will be due for colonoscopy at the end of the year  Dyspnea on exertion - undergoing eval by Dr. Einar Gip.  pulm w/u normal so far. Given h/o GERD, consider trial of nexium x 2 weeks to see if any improvement  Impaired fasting glucose - counseled re: diet, exercise, wt loss   Elevated MCV noted--add on B12 level  LFT, TSH (nonfasting) in 6 weeks   Discussed monthly self breast exams and yearly mammograms; at least 30 minutes  of aerobic activity at least 5 days/week and weight-bearing exercise 2x/week; proper sunscreen use reviewed; healthy diet, including goals of calcium and vitamin D intake and alcohol recommendations (less than or equal to 1 drink/day) reviewed; regular seatbelt use; changing batteries in smoke detectors. Immunization recommendations discussed--continue yearly high dose flu shots.Colonoscopy recommendations reviewed, due in 06/2020. DEXA due again 01/2020, and is scheduled.  MOST form reviewed, unchanged. Full Code, Full Care  F/u 6 mos for med check.   Medicare Attestation I have personally reviewed: The patient's medical and social history Their use of alcohol, tobacco or illicit drugs Their current medications and supplements The patient's functional ability including ADLs,fall risks, home safety risks, cognitive, and hearing and visual impairment Diet and physical activities Evidence for depression or mood disorders  The patient's weight, height, BMI have been recorded in the chart.  I have made referrals, counseling, and  provided education to the patient based on review of the above and I have provided the patient with a written personalized care plan for preventive services.

## 2020-01-26 ENCOUNTER — Encounter: Payer: Self-pay | Admitting: Family Medicine

## 2020-01-26 ENCOUNTER — Ambulatory Visit (INDEPENDENT_AMBULATORY_CARE_PROVIDER_SITE_OTHER): Payer: Medicare Other | Admitting: Family Medicine

## 2020-01-26 ENCOUNTER — Other Ambulatory Visit: Payer: Self-pay

## 2020-01-26 VITALS — BP 130/60 | HR 84 | Temp 98.2°F | Ht 64.0 in | Wt 138.0 lb

## 2020-01-26 DIAGNOSIS — Z01419 Encounter for gynecological examination (general) (routine) without abnormal findings: Secondary | ICD-10-CM | POA: Diagnosis not present

## 2020-01-26 DIAGNOSIS — R7301 Impaired fasting glucose: Secondary | ICD-10-CM | POA: Diagnosis not present

## 2020-01-26 DIAGNOSIS — Z0000 Encounter for general adult medical examination without abnormal findings: Secondary | ICD-10-CM | POA: Diagnosis not present

## 2020-01-26 DIAGNOSIS — E042 Nontoxic multinodular goiter: Secondary | ICD-10-CM

## 2020-01-26 DIAGNOSIS — I10 Essential (primary) hypertension: Secondary | ICD-10-CM | POA: Diagnosis not present

## 2020-01-26 DIAGNOSIS — R7989 Other specified abnormal findings of blood chemistry: Secondary | ICD-10-CM | POA: Diagnosis not present

## 2020-01-26 DIAGNOSIS — D126 Benign neoplasm of colon, unspecified: Secondary | ICD-10-CM | POA: Diagnosis not present

## 2020-01-26 DIAGNOSIS — E039 Hypothyroidism, unspecified: Secondary | ICD-10-CM

## 2020-01-26 DIAGNOSIS — E7800 Pure hypercholesterolemia, unspecified: Secondary | ICD-10-CM

## 2020-01-26 DIAGNOSIS — R0609 Other forms of dyspnea: Secondary | ICD-10-CM

## 2020-01-26 DIAGNOSIS — M810 Age-related osteoporosis without current pathological fracture: Secondary | ICD-10-CM

## 2020-01-26 DIAGNOSIS — D6851 Activated protein C resistance: Secondary | ICD-10-CM

## 2020-01-26 DIAGNOSIS — R0600 Dyspnea, unspecified: Secondary | ICD-10-CM | POA: Diagnosis not present

## 2020-01-26 MED ORDER — SYNTHROID 50 MCG PO TABS: 50.0000 ug | ORAL_TABLET | Freq: Every day | ORAL | 0 refills | Status: DC

## 2020-01-28 NOTE — Progress Notes (Signed)
Normal stress test

## 2020-01-30 ENCOUNTER — Ambulatory Visit
Admission: RE | Admit: 2020-01-30 | Discharge: 2020-01-30 | Disposition: A | Discharge status: Home / Self Care | Payer: Medicare Other | Source: Ambulatory Visit | Attending: Family Medicine | Admitting: Family Medicine

## 2020-01-30 ENCOUNTER — Other Ambulatory Visit: Payer: Self-pay

## 2020-01-30 DIAGNOSIS — Z1231 Encounter for screening mammogram for malignant neoplasm of breast: Secondary | ICD-10-CM

## 2020-01-30 DIAGNOSIS — M810 Age-related osteoporosis without current pathological fracture: Secondary | ICD-10-CM

## 2020-02-02 ENCOUNTER — Ambulatory Visit
Admission: RE | Admit: 2020-02-02 | Discharge: 2020-02-02 | Disposition: A | Discharge status: Home / Self Care | Payer: Medicare Other | Source: Ambulatory Visit | Attending: Family Medicine | Admitting: Family Medicine

## 2020-02-02 ENCOUNTER — Other Ambulatory Visit: Payer: Self-pay | Admitting: Family Medicine

## 2020-02-02 ENCOUNTER — Other Ambulatory Visit: Payer: Self-pay

## 2020-02-02 DIAGNOSIS — R928 Other abnormal and inconclusive findings on diagnostic imaging of breast: Secondary | ICD-10-CM

## 2020-02-02 DIAGNOSIS — E042 Nontoxic multinodular goiter: Secondary | ICD-10-CM

## 2020-02-02 DIAGNOSIS — R7989 Other specified abnormal findings of blood chemistry: Secondary | ICD-10-CM

## 2020-02-05 ENCOUNTER — Encounter: Payer: Self-pay | Admitting: Family Medicine

## 2020-02-09 ENCOUNTER — Ambulatory Visit: Payer: Medicare Other

## 2020-02-09 ENCOUNTER — Other Ambulatory Visit: Payer: Self-pay

## 2020-02-09 ENCOUNTER — Ambulatory Visit
Admission: RE | Admit: 2020-02-09 | Discharge: 2020-02-09 | Disposition: A | Discharge status: Home / Self Care | Payer: Medicare Other | Source: Ambulatory Visit | Attending: Family Medicine | Admitting: Family Medicine

## 2020-02-09 DIAGNOSIS — R928 Other abnormal and inconclusive findings on diagnostic imaging of breast: Secondary | ICD-10-CM

## 2020-02-09 LAB — VITAMIN B12: Vitamin B-12: 477 pg/mL (ref 232–1245)

## 2020-02-09 LAB — SPECIMEN STATUS REPORT

## 2020-02-15 ENCOUNTER — Encounter: Payer: Self-pay | Admitting: Family Medicine

## 2020-02-15 DIAGNOSIS — N3020 Other chronic cystitis without hematuria: Secondary | ICD-10-CM | POA: Diagnosis not present

## 2020-02-16 ENCOUNTER — Telehealth: Payer: Self-pay | Admitting: Family Medicine

## 2020-02-16 NOTE — Telephone Encounter (Signed)
Left message for pt to call concerning [place].

## 2020-02-21 DIAGNOSIS — Z961 Presence of intraocular lens: Secondary | ICD-10-CM | POA: Diagnosis not present

## 2020-02-22 ENCOUNTER — Other Ambulatory Visit: Payer: Self-pay | Admitting: Family Medicine

## 2020-02-22 DIAGNOSIS — E039 Hypothyroidism, unspecified: Secondary | ICD-10-CM

## 2020-03-08 ENCOUNTER — Other Ambulatory Visit (INDEPENDENT_AMBULATORY_CARE_PROVIDER_SITE_OTHER): Payer: Medicare Other

## 2020-03-08 ENCOUNTER — Other Ambulatory Visit: Payer: Self-pay | Admitting: Family Medicine

## 2020-03-08 ENCOUNTER — Other Ambulatory Visit: Payer: Self-pay

## 2020-03-08 DIAGNOSIS — M810 Age-related osteoporosis without current pathological fracture: Secondary | ICD-10-CM | POA: Diagnosis not present

## 2020-03-08 DIAGNOSIS — R7989 Other specified abnormal findings of blood chemistry: Secondary | ICD-10-CM | POA: Diagnosis not present

## 2020-03-08 DIAGNOSIS — E7800 Pure hypercholesterolemia, unspecified: Secondary | ICD-10-CM

## 2020-03-08 DIAGNOSIS — E039 Hypothyroidism, unspecified: Secondary | ICD-10-CM

## 2020-03-08 DIAGNOSIS — I10 Essential (primary) hypertension: Secondary | ICD-10-CM

## 2020-03-08 MED ORDER — DENOSUMAB 60 MG/ML ~~LOC~~ SOSY
60.0000 mg | PREFILLED_SYRINGE | Freq: Once | SUBCUTANEOUS | Status: AC
  Administered 2020-03-08: 60 mg via SUBCUTANEOUS

## 2020-03-09 ENCOUNTER — Encounter: Payer: Self-pay | Admitting: Family Medicine

## 2020-03-09 LAB — HEPATIC FUNCTION PANEL
ALT: 25 IU/L (ref 0–32)
AST: 24 IU/L (ref 0–40)
Albumin: 4.5 g/dL (ref 3.7–4.7)
Alkaline Phosphatase: 50 IU/L (ref 48–121)
Bilirubin Total: 0.5 mg/dL (ref 0.0–1.2)
Bilirubin, Direct: 0.16 mg/dL (ref 0.00–0.40)
Total Protein: 6.9 g/dL (ref 6.0–8.5)

## 2020-03-09 LAB — TSH: TSH: 0.951 u[IU]/mL (ref 0.450–4.500)

## 2020-03-13 DIAGNOSIS — M25551 Pain in right hip: Secondary | ICD-10-CM | POA: Diagnosis not present

## 2020-03-13 DIAGNOSIS — M25552 Pain in left hip: Secondary | ICD-10-CM | POA: Diagnosis not present

## 2020-03-28 DIAGNOSIS — M25551 Pain in right hip: Secondary | ICD-10-CM | POA: Diagnosis not present

## 2020-04-03 ENCOUNTER — Encounter: Payer: Self-pay | Admitting: Family Medicine

## 2020-04-04 DIAGNOSIS — M25551 Pain in right hip: Secondary | ICD-10-CM | POA: Diagnosis not present

## 2020-04-15 ENCOUNTER — Other Ambulatory Visit: Payer: Self-pay | Admitting: Family Medicine

## 2020-04-15 DIAGNOSIS — E039 Hypothyroidism, unspecified: Secondary | ICD-10-CM

## 2020-05-05 DIAGNOSIS — Z23 Encounter for immunization: Secondary | ICD-10-CM | POA: Diagnosis not present

## 2020-05-09 DIAGNOSIS — M84374A Stress fracture, right foot, initial encounter for fracture: Secondary | ICD-10-CM | POA: Diagnosis not present

## 2020-05-14 ENCOUNTER — Encounter: Payer: Self-pay | Admitting: Family Medicine

## 2020-05-16 DIAGNOSIS — M84374D Stress fracture, right foot, subsequent encounter for fracture with routine healing: Secondary | ICD-10-CM | POA: Diagnosis not present

## 2020-05-21 ENCOUNTER — Encounter: Payer: Self-pay | Admitting: Family Medicine

## 2020-05-21 DIAGNOSIS — M810 Age-related osteoporosis without current pathological fracture: Secondary | ICD-10-CM

## 2020-05-21 DIAGNOSIS — E559 Vitamin D deficiency, unspecified: Secondary | ICD-10-CM

## 2020-05-23 ENCOUNTER — Other Ambulatory Visit: Payer: Self-pay

## 2020-05-23 ENCOUNTER — Other Ambulatory Visit: Payer: Medicare Other

## 2020-05-23 DIAGNOSIS — E559 Vitamin D deficiency, unspecified: Secondary | ICD-10-CM | POA: Diagnosis not present

## 2020-05-23 DIAGNOSIS — M810 Age-related osteoporosis without current pathological fracture: Secondary | ICD-10-CM | POA: Diagnosis not present

## 2020-05-24 ENCOUNTER — Ambulatory Visit (INDEPENDENT_AMBULATORY_CARE_PROVIDER_SITE_OTHER): Payer: Medicare Other

## 2020-05-24 ENCOUNTER — Encounter: Payer: Self-pay | Admitting: Podiatry

## 2020-05-24 ENCOUNTER — Ambulatory Visit (INDEPENDENT_AMBULATORY_CARE_PROVIDER_SITE_OTHER): Payer: Medicare Other | Admitting: Podiatry

## 2020-05-24 DIAGNOSIS — M722 Plantar fascial fibromatosis: Secondary | ICD-10-CM | POA: Diagnosis not present

## 2020-05-24 DIAGNOSIS — M7741 Metatarsalgia, right foot: Secondary | ICD-10-CM | POA: Diagnosis not present

## 2020-05-24 DIAGNOSIS — G5761 Lesion of plantar nerve, right lower limb: Secondary | ICD-10-CM | POA: Diagnosis not present

## 2020-05-24 DIAGNOSIS — M76821 Posterior tibial tendinitis, right leg: Secondary | ICD-10-CM | POA: Diagnosis not present

## 2020-05-24 DIAGNOSIS — G5781 Other specified mononeuropathies of right lower limb: Secondary | ICD-10-CM

## 2020-05-24 DIAGNOSIS — S92301A Fracture of unspecified metatarsal bone(s), right foot, initial encounter for closed fracture: Secondary | ICD-10-CM

## 2020-05-24 LAB — VITAMIN D 25 HYDROXY (VIT D DEFICIENCY, FRACTURES): Vit D, 25-Hydroxy: 68.5 ng/mL (ref 30.0–100.0)

## 2020-05-24 MED ORDER — METHYLPREDNISOLONE 4 MG PO TBPK
ORAL_TABLET | ORAL | 0 refills | Status: DC
Start: 2020-05-24 — End: 2020-07-09

## 2020-05-24 MED ORDER — MELOXICAM 15 MG PO TABS
15.0000 mg | ORAL_TABLET | Freq: Every day | ORAL | 3 refills | Status: DC
Start: 2020-05-24 — End: 2020-07-09

## 2020-05-24 NOTE — Progress Notes (Signed)
Subjective:  Patient ID: [name], female    DOB: 1946-02-09,  MRN: [id number] HPI Chief Complaint  Patient presents with  . Foot Pain    Lateral foot right - patient states foot started to hurt a few months ago, no injury, went to see Dr. Victorino December has multiple fractures, wearing a boot, wants 2nd opinion  . New Patient (Initial Visit)    74 y.o. female presents with the above complaint.   ROS: Denies fever chills nausea vomiting muscle aches pains calf pain back pain chest pain shortness of breath.  Past Medical History:  Diagnosis Date  . Anemia   . Arthritis    knees  . Chronic constipation   . Colon polyp 06/2015   tubular adenoma (sigmoid)  . Diverticulosis    seen on colonoscopy 06/2015  . GERD (gastroesophageal reflux disease)    normal EGD 06/2015  . Heterozygous factor V Leiden mutation [facility]) dx 07/2013     hematologist--  dr Bernadene Bell ([facility])--  per note "Low Risk"  . History of DVT of lower extremity    dx 07-28-2013  left lower extrem.  --  xarelto for 6 months  . Hypertension   . Lymphadenopathy, inguinal    bilateral  . Microscopic hematuria    chronic   . Multinodular goiter   . Multinodular thyroid    a. Path benign 2013.  [name] OAB (overactive bladder)   . Osteoporosis 2017   osteopenia since 2011; osteoporosis 12/2015  . Pre-diabetes    no meds, managaed with lifestyle changes   . Prediabetes   . SUI (stress urinary incontinence, female)   . Tricuspid regurgitation    mild-mod, noted on echo 03/2016   Past Surgical History:  Procedure Laterality Date  . APPENDECTOMY  1973  . CATARACT EXTRACTION W/ INTRAOCULAR LENS  IMPLANT, BILATERAL Bilateral 2015  . CYSTOCELE REPAIR  12-03-2010  . CYSTOSCOPY MACROPLASTIQUE IMPLANT N/A 12/12/2014   Procedure: CYSTOSCOPY MACROPLASTIQUE INJECTION;  Surgeon: Bjorn Loser, MD;  Location: [facility];  Service: Urology;  Laterality: N/A;  . CYSTOSCOPY MACROPLASTIQUE IMPLANT N/A  04/12/2015   Procedure: CYSTOSCOPY MACROPLASTIQUE IMPLANT;  Surgeon: Bjorn Loser, MD;  Location: [facility];  Service: Urology;  Laterality: N/A;  . INCONTINENCE SURGERY  04/1999   Re-do at [facility] for sling complications in [id number]  . KNEE ARTHROSCOPY Right 04-05-2013  . LATERAL EPICONDYLE RELEASE Right   . NEUROMA SURGERY Bilateral 1980's    feet  . OVARIAN CYST REMOVAL Left 1973  . PUBOVAGINAL SLING N/A 07/30/2015   Procedure: PELVIC EXAM UNDER ANESTHESIA, TRANSVAGINAL BIOPSY OF PERIURETHRAL MASS;  Surgeon: Raynelle Bring, MD;  Location: [facility];  Service: Urology;  Laterality: N/A;  63 MINS   . TONSILLECTOMY  as child  . TOTAL ABDOMINAL HYSTERECTOMY W/ BILATERAL SALPINGOOPHORECTOMY  09/  2000  . TOTAL KNEE ARTHROPLASTY Left 09/21/2017   Procedure: LEFT TOTAL KNEE ARTHROPLASTY;  Surgeon: Paralee Cancel, MD;  Location: [facility];  Service: Orthopedics;  Laterality: Left;  90 mins  . TRANSTHORACIC ECHOCARDIOGRAM  05-04-2014   normal ,  ef 60-65%    Current Outpatient Medications:  .  aspirin 81 MG chewable tablet, Chew 81 mg by mouth daily., Disp: , Rfl:  .  CALCIUM-MAGNESIUM-VITAMIN D PO, Take 1 tablet by mouth daily., Disp: , Rfl:  .  denosumab (PROLIA) 60 MG/ML SOSY injection, Prolia 60 mg/mL subcutaneous syringe, Disp: , Rfl:  .  diphenhydrAMINE HCl (BENADRYL PO), Take 12.5 mg by mouth  at bedtime., Disp: , Rfl:  .  ibandronate (BONIVA) 150 MG tablet, TAKE ONE TABLET ONCE A MONTH IN THE MORNING ON AN EMPTY STOMACH WITH WATER. REMAIN UPRIGHT., Disp: 3 tablet, Rfl: 0 .  lisinopril-hydrochlorothiazide (ZESTORETIC) 20-12.5 MG tablet, TAKE 1 TABLET ONCE DAILY., Disp: 90 tablet, Rfl: 0 .  meloxicam (MOBIC) 15 MG tablet, Take 1 tablet (15 mg total) by mouth daily., Disp: 30 tablet, Rfl: 3 .  methylPREDNISolone (MEDROL DOSEPAK) 4 MG TBPK tablet, 6 day dose pack - take as directed, Disp: 21 tablet, Rfl: 0 .  Multiple Vitamins-Minerals (MULTIVITAMIN WITH MINERALS) tablet, Take 1 tablet by  mouth daily., Disp: , Rfl:  .  nitrofurantoin (MACRODANTIN) 100 MG capsule, Take 100 mg by mouth daily., Disp: , Rfl:  .  simvastatin (ZOCOR) 40 MG tablet, TAKE ONE TABLET AT BEDTIME., Disp: 90 tablet, Rfl: 0 .  SYNTHROID 50 MCG tablet, TAKE 1 TABLET DAILY BEFORE BREAKFAST FOR HYPOTHYROIDISM, Disp: 90 tablet, Rfl: 0 .  VITAMIN D PO, Take 1,000 mg by mouth daily., Disp: , Rfl:   Allergies  Allergen Reactions  . Naprosyn [Naproxen] Rash   Review of Systems Objective:  There were no vitals filed for this visit.  General: Well developed, nourished, in no acute distress, alert and oriented x3   Dermatological: Skin is warm, dry and supple bilateral. Nails x 10 are well maintained; remaining integument appears unremarkable at this time. There are no open sores, no preulcerative lesions, no rash or signs of infection present.  Vascular: Dorsalis Pedis artery and Posterior Tibial artery pedal pulses are 2/4 bilateral with immedate capillary fill time. Pedal hair growth present. No varicosities and no lower extremity edema present bilateral.   Neruologic: Grossly intact via light touch bilateral. Vibratory intact via tuning fork bilateral. Protective threshold with Semmes Wienstein monofilament intact to all pedal sites bilateral. Patellar and Achilles deep tendon reflexes 2+ bilateral. No Babinski or clonus noted bilateral.   Musculoskeletal: No gross boney pedal deformities bilateral. No pain, crepitus, or limitation noted with foot and ankle range of motion bilateral. Muscular strength 5/5 in all groups tested bilateral.  She has pain on palpation of the posterior tibial tendon with fluctuance in the tendon sheath just distal to the medial malleolus.  She also has pain on palpation medial calcaneal tubercle.  Gait: Unassisted, Nonantalgic.    Radiographs:  Radiographs taken today do not demonstrate any type of osseous abnormalities i.e. no fractures.  At this point he does demonstrate soft  tissue increase in density with plantar fascial cannula insertion site.  Assessment & Plan:   Assessment: Plantar fasciitis with posterior tibial tendinitis lateral compensatory syndrome.  Plan: Discussed etiology pathology conservative versus surgical therapies at this point I went ahead and injected her right heel with 20 mg Kenalog 5 mg Marcaine start her on a Medrol Dosepak to be followed by meloxicam placed her in a plantar fascial brace       Noya Santarelli T. Reyno, [place]

## 2020-05-25 ENCOUNTER — Encounter: Payer: Self-pay | Admitting: Family Medicine

## 2020-05-25 ENCOUNTER — Encounter: Payer: Self-pay | Admitting: Podiatry

## 2020-06-09 ENCOUNTER — Other Ambulatory Visit: Payer: Self-pay | Admitting: Family Medicine

## 2020-06-09 DIAGNOSIS — E7800 Pure hypercholesterolemia, unspecified: Secondary | ICD-10-CM

## 2020-06-09 DIAGNOSIS — I10 Essential (primary) hypertension: Secondary | ICD-10-CM

## 2020-06-11 DIAGNOSIS — M545 Low back pain, unspecified: Secondary | ICD-10-CM

## 2020-06-11 HISTORY — DX: Low back pain, unspecified: M54.50

## 2020-06-11 MED ORDER — LISINOPRIL-HYDROCHLOROTHIAZIDE 20-12.5 MG PO TABS: 1.0000 | ORAL_TABLET | Freq: Every day | ORAL | 0 refills | Status: DC

## 2020-06-11 MED ORDER — SIMVASTATIN 40 MG PO TABS: 40.0000 mg | ORAL_TABLET | Freq: Every day | ORAL | 0 refills | Status: DC

## 2020-06-19 ENCOUNTER — Ambulatory Visit: Payer: Medicare Other

## 2020-06-19 ENCOUNTER — Other Ambulatory Visit: Payer: Self-pay

## 2020-06-19 DIAGNOSIS — R0609 Other forms of dyspnea: Secondary | ICD-10-CM | POA: Diagnosis not present

## 2020-06-19 DIAGNOSIS — R0600 Dyspnea, unspecified: Secondary | ICD-10-CM

## 2020-06-19 DIAGNOSIS — I071 Rheumatic tricuspid insufficiency: Secondary | ICD-10-CM | POA: Diagnosis not present

## 2020-06-21 DIAGNOSIS — M5459 Other low back pain: Secondary | ICD-10-CM | POA: Diagnosis not present

## 2020-06-21 DIAGNOSIS — M79604 Pain in right leg: Secondary | ICD-10-CM | POA: Diagnosis not present

## 2020-06-25 DIAGNOSIS — M5459 Other low back pain: Secondary | ICD-10-CM | POA: Diagnosis not present

## 2020-06-25 DIAGNOSIS — M545 Low back pain, unspecified: Secondary | ICD-10-CM | POA: Diagnosis not present

## 2020-06-28 ENCOUNTER — Ambulatory Visit: Payer: Medicare Other | Admitting: Podiatry

## 2020-07-04 DIAGNOSIS — D6851 Activated protein C resistance: Secondary | ICD-10-CM | POA: Diagnosis not present

## 2020-07-04 DIAGNOSIS — M79604 Pain in right leg: Secondary | ICD-10-CM | POA: Diagnosis not present

## 2020-07-04 DIAGNOSIS — M79671 Pain in right foot: Secondary | ICD-10-CM | POA: Diagnosis not present

## 2020-07-09 ENCOUNTER — Other Ambulatory Visit: Payer: Self-pay

## 2020-07-09 ENCOUNTER — Ambulatory Visit: Payer: Medicare Other | Admitting: Cardiology

## 2020-07-09 ENCOUNTER — Encounter: Payer: Self-pay | Admitting: Cardiology

## 2020-07-09 ENCOUNTER — Telehealth: Payer: Self-pay | Admitting: Cardiology

## 2020-07-09 VITALS — BP 134/64 | HR 80 | Resp 16 | Ht 64.0 in | Wt 134.2 lb

## 2020-07-09 DIAGNOSIS — I10 Essential (primary) hypertension: Secondary | ICD-10-CM

## 2020-07-09 DIAGNOSIS — R0609 Other forms of dyspnea: Secondary | ICD-10-CM | POA: Diagnosis not present

## 2020-07-09 DIAGNOSIS — M79671 Pain in right foot: Secondary | ICD-10-CM | POA: Insufficient documentation

## 2020-07-09 MED ORDER — LOSARTAN POTASSIUM-HCTZ 100-12.5 MG PO TABS: 1.0000 | ORAL_TABLET | Freq: Every day | ORAL | 3 refills | Status: DC

## 2020-07-09 NOTE — Telephone Encounter (Signed)
Yes she was given results in March 2021.

## 2020-07-09 NOTE — Telephone Encounter (Signed)
Thanks for bringing this to my attention.  The HRCT Chest /VQ were canceled and CT a Chest was ordered in 10/2019 that was okay .  I could not find any other notes for additional testing indicated.  Thanks for the follow up .

## 2020-07-09 NOTE — Progress Notes (Signed)
Primary Physician/Referring:  [name], MD  Patient ID: Dorothy Rojas, female    DOB: June 05, 1946, 74 y.o.   MRN: [id number]  Chief Complaint  Patient presents with  . Shortness of Breath  . Dizziness  . Results    Echocardiogram  . Follow-up    6 week   HPI:    Dorothy Rojas  is a 74 y.o. Caucasian female with chronic dyspnea on exertion, prior history of tobacco use disorder approximately 15 to 20 pack year history quit in 1986, hypertension, hyperlipidemia, hyperglycemia and a family history of premature coronary artery disease, history of DVT and factor V Leyden positive in 2014. Pulmonary evaluation has not reveled etiology of her symptoms, CT scan negative for PE in 2021. Patient with normal nuclear stress test 01/25/2020.  Patient presents for 6 month follow up of dyspnea.  She has no new symptoms, has developed plantar fasciitis and has not been able to exercise for the last couple weeks.  She was evaluated by pulmonary medicine and high-resolution CT and chest x-ray has been ordered but has not been performed yet.  Past Medical History:  Diagnosis Date  . Anemia   . Arthritis    knees  . Chronic constipation   . Colon polyp 06/2015   tubular adenoma (sigmoid)  . Diverticulosis    seen on colonoscopy 06/2015  . GERD (gastroesophageal reflux disease)    normal EGD 06/2015  . Heterozygous factor V Leiden mutation [facility]) dx 07/2013     hematologist--  dr Bernadene Bell ([facility])--  per note "Low Risk"  . History of DVT of lower extremity    dx 07-28-2013  left lower extrem.  --  xarelto for 6 months  . Hypertension   . Lymphadenopathy, inguinal    bilateral  . Microscopic hematuria    chronic   . Multinodular goiter   . Multinodular thyroid    a. Path benign 2013.  [name] OAB (overactive bladder)   . Osteoporosis 2017   osteopenia since 2011; osteoporosis 12/2015  . Pre-diabetes    no meds, managaed with lifestyle changes   . Prediabetes   . SUI  (stress urinary incontinence, female)   . Tricuspid regurgitation    mild-mod, noted on echo 03/2016   Past Surgical History:  Procedure Laterality Date  . APPENDECTOMY  1973  . CATARACT EXTRACTION W/ INTRAOCULAR LENS  IMPLANT, BILATERAL Bilateral 2015  . CYSTOCELE REPAIR  12-03-2010  . CYSTOSCOPY MACROPLASTIQUE IMPLANT N/A 12/12/2014   Procedure: CYSTOSCOPY MACROPLASTIQUE INJECTION;  Surgeon: Bjorn Loser, MD;  Location: [facility];  Service: Urology;  Laterality: N/A;  . CYSTOSCOPY MACROPLASTIQUE IMPLANT N/A 04/12/2015   Procedure: CYSTOSCOPY MACROPLASTIQUE IMPLANT;  Surgeon: Bjorn Loser, MD;  Location[facility];  Service: Urology;  Laterality: N/A;  . INCONTINENCE SURGERY  04/1999   Re-do at [facility] for sling complications in 1638  . KNEE ARTHROSCOPY Right 04-05-2013  . LATERAL EPICONDYLE RELEASE Right   . NEUROMA SURGERY Bilateral 1980's    feet  . OVARIAN CYST REMOVAL Left 1973  . PUBOVAGINAL SLING N/A 07/30/2015   Procedure: PELVIC EXAM UNDER ANESTHESIA, TRANSVAGINAL BIOPSY OF PERIURETHRAL MASS;  Surgeon: Raynelle Bring, MD;  Location: [facility];  Service: Urology;  Laterality: N/A;  35 MINS   . TONSILLECTOMY  as child  . TOTAL ABDOMINAL HYSTERECTOMY W/ BILATERAL SALPINGOOPHORECTOMY  09/  2000  . TOTAL KNEE ARTHROPLASTY Left 09/21/2017   Procedure: LEFT TOTAL KNEE ARTHROPLASTY;  Surgeon: Paralee Cancel,  MD;  Location: [facility];  Service: Orthopedics;  Laterality: Left;  90 mins  . TRANSTHORACIC ECHOCARDIOGRAM  05-04-2014   normal ,  ef 60-65%   Family History  Problem Relation Age of Onset  . Hypertension Father   . Heart disease Father 32       CABG, died from MI at 37  . COPD Father   . Cirrhosis Mother        related to psoriasis medications  . Diabetes Mother   . Psoriasis Mother   . Osteogenesis imperfecta Sister   . Heart disease Sister        CHF  . Diabetes Brother   . Heart disease Brother 4       MI  . Deep vein thrombosis  Brother        factor V Leiden NEGATIVE  . Cancer Brother 24       bile duct and liver    Social History   Tobacco Use  . Smoking status: Former Smoker    Packs/day: 1.00    Years: 21.00    Pack years: 21.00    Types: Cigarettes    Quit date: 08/12/1983    Years since quitting: 36.9  . Smokeless tobacco: Never Used  Substance Use Topics  . Alcohol use: Yes    Alcohol/week: 0.0 standard drinks    Comment: 1 glass of wine most days of the week (5oz)   Marital Status: Married ROS  Review of Systems  Cardiovascular: Positive for dyspnea on exertion (chronic and stable). Negative for leg swelling and syncope.  Musculoskeletal: Positive for arthritis (knee).  Gastrointestinal: Negative for hematochezia and melena.   Objective  Blood pressure 134/64, pulse 80, resp. rate 16, height 5\' 4"  (1.626 m), weight 134 lb 3.2 oz (60.9 kg), SpO2 95 %.  Vitals with BMI 07/09/2020 01/26/2020 01/05/2020  Height 5\' 4"  5\' 4"  5\' 4"   Weight 134 lbs 3 oz 138 lbs 137 lbs  BMI 23.02 49.67 59.1  Systolic 638 466 599  Diastolic 64 60 65  Pulse 80 84 84     Physical Exam Neck:     Thyroid: No thyromegaly.  Cardiovascular:     Rate and Rhythm: Normal rate and regular rhythm.     Pulses: Intact distal pulses.     Heart sounds: Normal heart sounds. No murmur heard.  No gallop.      Comments: No leg edema, no JVD. Pulmonary:     Effort: Pulmonary effort is normal.     Breath sounds: Normal breath sounds.  Abdominal:     General: Bowel sounds are normal.     Palpations: Abdomen is soft.  Musculoskeletal:     Cervical back: Neck supple.  Skin:    General: Skin is warm and dry.    Laboratory examination:   Recent Labs    07/18/19 1121 07/18/19 1121 10/13/19 1030 10/14/19 0844 01/24/20 0828  NA 141  --  140 141 141  K 4.4   < > 4.1 3.8 4.8  CL 101   < > 101 104 101  CO2 25   < > 31 30 25   GLUCOSE 93  --  95 92 103*  BUN 15  --  14 14 14   CREATININE 0.85   < > 0.85 0.84 0.93  CALCIUM  9.7   < > 9.9 9.5 9.9  GFRNONAA 68  --   --   --  61  GFRAA 79  --   --   --  71   < > = values in this interval not displayed.   CrCl cannot be calculated (Patient's most recent lab result is older than the maximum 21 days allowed.).  CMP Latest Ref Rng & Units 03/08/2020 01/24/2020 10/14/2019  Glucose 65 - 99 mg/dL - 103(H) 92  BUN 8 - 27 mg/dL - 14 14  Creatinine 0.57 - 1.00 mg/dL - 0.93 0.84  Sodium 134 - 144 mmol/L - 141 141  Potassium 3.5 - 5.2 mmol/L - 4.8 3.8  Chloride 96 - 106 mmol/L - 101 104  CO2 20 - 29 mmol/L - 25 30  Calcium 8.7 - 10.3 mg/dL - 9.9 9.5  Total Protein 6.0 - 8.5 g/dL 6.9 7.0 -  Total Bilirubin 0.0 - 1.2 mg/dL 0.5 0.6 -  Alkaline Phos 48 - 121 IU/L 50 49 -  AST 0 - 40 IU/L 24 78(H) -  ALT 0 - 32 IU/L 25 58(H) -   CBC Latest Ref Rng & Units 01/24/2020 10/13/2019 01/10/2019  WBC 3.4 - 10.8 x10E3/uL 6.7 6.5 6.2  Hemoglobin 11.1 - 15.9 g/dL 12.6 12.2 12.5  Hematocrit 34.0 - 46.6 % 38.7 35.6(L) 36.3  Platelets 150 - 450 x10E3/uL 230 199.0 232   Lipid Panel     Component Value Date/Time   CHOL 135 01/24/2020 0828   TRIG 177 (H) 01/24/2020 0828   HDL 47 01/24/2020 0828   CHOLHDL 2.9 01/24/2020 0828   CHOLHDL 3.0 06/11/2017 0851   VLDL 37 (H) 12/04/2016 0713   LDLCALC 59 01/24/2020 0828   LDLCALC 72 06/11/2017 0851   HEMOGLOBIN A1C Lab Results  Component Value Date   HGBA1C 6.0 (H) 01/24/2020   MPG 128.37 09/15/2017   TSH Recent Labs    07/18/19 1121 01/24/20 0828 03/08/20 0824  TSH 3.810 4.780* 0.951   Medications and allergies   Allergies  Allergen Reactions  . Naprosyn [Naproxen] Rash     Current Outpatient Medications  Medication Instructions  . aspirin 81 mg, Oral, Daily  . CALCIUM-MAGNESIUM-VITAMIN D PO 1 tablet, Oral, Daily  . denosumab (PROLIA) 60 MG/ML SOSY injection Prolia 60 mg/mL subcutaneous syringe  . diphenhydrAMINE HCl (BENADRYL PO) 12.5 mg, Oral, Daily at bedtime  . ibandronate (BONIVA) 150 MG tablet TAKE ONE TABLET ONCE A  MONTH IN THE MORNING ON AN EMPTY STOMACH WITH WATER. REMAIN UPRIGHT.  [name] losartan-hydrochlorothiazide (HYZAAR) 100-12.5 MG tablet 1 tablet, Oral, Daily  . Multiple Vitamins-Minerals (MULTIVITAMIN WITH MINERALS) tablet 1 tablet, Oral, Daily  . nitrofurantoin (MACRODANTIN) 100 mg, Oral, Daily  . simvastatin (ZOCOR) 40 mg, Oral, Daily at bedtime  . SYNTHROID 50 MCG tablet TAKE 1 TABLET DAILY BEFORE BREAKFAST FOR HYPOTHYROIDISM  . VITAMIN D PO 1,000 mg, Oral, Daily    Radiology:   Cardiac CTA 05/04/2014: Calcium score 0, no plaque, normal coronary arteries, right dominant circulation.  CT Angio Chest 10/14/2019: 1. No CT findings for pulmonary embolism. 2. Thoracic aortic calcifications but no dissection or aneurysm. 3. No acute pulmonary findings. Aortic Atherosclerosis   Cardiac Studies:   PCV MYOCARDIAL PERFUSION WO LEXISCAN 01/25/2020 Exercise nuclear stress test was performed using Bruce protocol. Patient reached 7 METS, and 90% of age predicted maximum heart rate. Exercise capacity was low. Chest pain not reported. Dyspnea , fatigue reported. Heart rate and hemodynamic response were normal. Interpretation of stress EKG limited due to significant baseline artifact. Normal myocardial perfusion. Stress LVEF 82%. Low risk study.  PCV ECHOCARDIOGRAM COMPLETE 79/89/2119 Normal LV systolic function with visual EF 60-65%. Left ventricle cavity  is normal in size. Mild left ventricular hypertrophy. Normal global wall motion. Normal diastolic filling pattern, normal LAP. Mild (Grade I) mitral regurgitation. Mild tricuspid regurgitation. No evidence of pulmonary hypertension. Compared to prior study dated 04/09/2016 08/19/2019, no significant changes noted.  EKG   09/14/2019: Normal sinus rhythm at rate of 81 bpm,  nonspecific inferior and lateral 2 mm sagging ST depressions. Compared to  EKG 03/26/2016, ST changes new.   Assessment     ICD-10-CM   1. Dyspnea on exertion  R06.00 EKG 12-Lead   2. Essential hypertension  I10 losartan-hydrochlorothiazide (HYZAAR) 100-12.5 MG tablet    Meds ordered this encounter  Medications  . losartan-hydrochlorothiazide (HYZAAR) 100-12.5 MG tablet    Sig: Take 1 tablet by mouth daily.    Dispense:  90 tablet    Refill:  3    Discontinue lisinopril - Cough    Medications Discontinued During This Encounter  Medication Reason  . meloxicam (MOBIC) 15 MG tablet Patient Preference  . methylPREDNISolone (MEDROL DOSEPAK) 4 MG TBPK tablet Completed Course  . lisinopril-hydrochlorothiazide (ZESTORETIC) 20-12.5 MG tablet Change in therapy    Recommendations:   CARLEA BADOUR  is a 74 y.o. Caucasian female with chronic dyspnea on exertion, prior history of tobacco use disorder approximately 15 to 20 pack year history quit in 1986, hypertension, hyperlipidemia, hyperglycemia and a family history of premature coronary artery disease presents here for evaluation of worsening dyspnea on exertion, was evaluated by pulmonary medicine.   Chronic dyspnea with exertion questionable etiology, suspect deconditioning, pulmonary work-up is still pending. Lab work was unrevealing with normal CBC and alpha-1 testing.  Pulmonary function testing shows no evidence of COPD.  No airflow obstruction or restriction.  Her DLCO was slightly decreased.   I have discontinued lisinopril in view of chronic mild cough and advised the patient to let me know if she feels better.  We will also send a message to Ms. Patricia Nettle, NP for follow-up on CT and chest x-ray.  Otherwise stable from cardiac standpoint, I will see her back on a as needed basis.   Adrian Prows, MD, [facility] 07/09/2020, 9:31 AM Office: [phone] Pager: [phone]

## 2020-07-09 NOTE — Telephone Encounter (Signed)
Is the loop closed? So I can inform patient or you can call and close the loop. JG

## 2020-07-11 ENCOUNTER — Encounter: Payer: Self-pay | Admitting: Family Medicine

## 2020-07-17 ENCOUNTER — Ambulatory Visit (INDEPENDENT_AMBULATORY_CARE_PROVIDER_SITE_OTHER): Payer: Medicare Other | Admitting: Podiatry

## 2020-07-17 ENCOUNTER — Encounter: Payer: Self-pay | Admitting: Podiatry

## 2020-07-17 ENCOUNTER — Other Ambulatory Visit: Payer: Self-pay

## 2020-07-17 DIAGNOSIS — G5761 Lesion of plantar nerve, right lower limb: Secondary | ICD-10-CM

## 2020-07-17 DIAGNOSIS — G5781 Other specified mononeuropathies of right lower limb: Secondary | ICD-10-CM

## 2020-07-17 MED ORDER — TRIAMCINOLONE ACETONIDE 40 MG/ML IJ SUSP
20.0000 mg | Freq: Once | INTRAMUSCULAR | Status: AC
  Administered 2020-07-17: 20 mg

## 2020-07-17 NOTE — Progress Notes (Signed)
She presents today for follow-up of her plantar fasciitis and posterior tibial tendinitis right states that is better but when I want for a long time it still hurts some.  Objective: Vital signs are stable alert oriented x3 she has pain on palpation to the third interspace of the right foot. She has no longer having pain at the medial calcaneal tubercle or posterior tibial tendon.  Assessment: Neuroma third interspace right foot. Most likely secondary to compensatory syndrome.  Plan: Injected the neuroma 10 mg Kenalog 5 mg Marcaine point maximal tenderness right foot. Follow-up with her in 1 month make sure she is not starting to redevelop the plantar fasciitis.

## 2020-07-23 ENCOUNTER — Encounter: Payer: Self-pay | Admitting: Family Medicine

## 2020-07-25 NOTE — Progress Notes (Addendum)
Chief Complaint  Patient presents with  . Hypertension    Fasting med check. Patient is on synthroid and she read somewhere that it can cause issues with bones. Wonders if this has anything to do with her bone issues.     Hypertension follow-up:Dr Ganji switched her last month from lisinopril HCT to losartan HCT due to some cough/scratchy voice.  So far hasn't noticed any improvement in the throat-clearing/cough.  She does note some sinus drainage/allergies. Denies any LH, dizziness.  Hasn't been checking BP recently.  Deniesheadaches or lightheadedness, syncope, chest pain. Denies side effects of medications. She denies exertional chest pain.   Saw ortho after developed sciatica after echo.  Treated with prednisone and gabapentin.  She got a refill just to use as needed, hasn't needed.   She is now back to walking, and is able to notice a slight shortness of breath.  She states that Dr. Einar Gip wanted her to try and push herself (she was deconditioned). Hasn't started back yet at [employer], plans to in January.  GERD: Has some reflux related to her diet. Occasionally has symptoms related to [place] food, [employer], and Nexium OTC works well.Denies dysphagia.  Hyperlipidemia follow-up: Patient is reportedly following a low-fat, low cholesterol diet. Compliant with medications (simvastatin 40mg )and denies medication side effects. TG mildly elevated on last check: Lab Results  Component Value Date   CHOL 135 01/24/2020   HDL 47 01/24/2020   LDLCALC 59 01/24/2020   TRIG 177 (H) 01/24/2020   CHOLHDL 2.9 01/24/2020   Impaired fasting glucose. She has had mildly elevated fasting sugars. A1c was back up to 6% on last check. She continues to avoid sugar and limit carbs in her diet. 2 dark chocolate candy kisses after lunch or dinner, 1 glass of red wine daily. She switched to non-alcoholic wine. She thinks it has less sugar and fewer calories.   Tries to limit carbs.  Lab  Results  Component Value Date   HGBA1C 6.0 (H) 01/24/2020    H/o DVT-- s/p6 months ofanticoagulation withxarelto.She was discharged from the care of hematologist. If any recurrence, will need lifelong treatment.  She is taking 81mg  of ASA daily. (factor V Leiden heterozygous).No other blood thinning agents were recommended. Denies any swelling or pain.Denies any bleeding/bruising.  MNG--denies symptoms/dysphagia. She had benign biopsies in 2020. Last [place] was in 01/2020: IMPRESSION: Heterogeneous thyroid, potentially indicating medical thyroid disease. No thyroid nodule meets criteria for biopsy or surveillance, as designated by the newly established ACR TI-RADS criteria.  Hypothyroidism:  She was started on 58mcg Synthroid last year due to elevated TSH (TSH 6.2 in 01/2019), dry skin, cold intolerance. Recheck was normal, but in June the TSH was up to 4.780 and dose was increased to 10mcg.  Repeat testing in 02/2020 was normal at 0.951.  She is due for recheck today. She denies changes to hair/skin/bowels/energy, constipation.  She reports compliance with medication, taking on an empty stomach, separate from other medications. Some dry skin (winter).  Stools are frequent, sometimes 3-4x/day, soft (not loose). +weight loss, not necessarily intentional. Lab Results  Component Value Date   TSH 0.951 03/08/2020   Osteoporosis: She was started on Boniva in May 2017, after her DEXA done 12/10/15 showed T -2.5 at R fem neck. She tolerated it without side effects, no dysphagia or chest pain. Takes Ca+D once daily. She had DEXA done 01/2018, with T-2.3 at R fem neck (was -2.5 in 2017); Repeat DEXA in 01/2020 showed T-2.5 at R fem  neck. She was then changed from Boniva to Prolia injections.  She got her first injection in 02/2020. She tolerated this without side effects.   PMH, PSH, SH reviewed  Outpatient Encounter Medications as of 07/26/2020  Medication Sig  . aspirin 81 MG chewable tablet  Chew 81 mg by mouth daily.  [name] CALCIUM-MAGNESIUM-VITAMIN D PO Take 1 tablet by mouth daily.  [name] denosumab (PROLIA) 60 MG/ML SOSY injection Prolia 60 mg/mL subcutaneous syringe  . losartan-hydrochlorothiazide (HYZAAR) 100-12.5 MG tablet Take 1 tablet by mouth daily.  . Multiple Vitamins-Minerals (MULTIVITAMIN WITH MINERALS) tablet Take 1 tablet by mouth daily.  . nitrofurantoin (MACRODANTIN) 100 MG capsule Take 100 mg by mouth daily.  . simvastatin (ZOCOR) 40 MG tablet Take 1 tablet (40 mg total) by mouth at bedtime.  [name] SYNTHROID 50 MCG tablet TAKE 1 TABLET DAILY BEFORE BREAKFAST FOR HYPOTHYROIDISM  . VITAMIN D PO Take 1,000 mg by mouth daily.  . diphenhydrAMINE HCl (BENADRYL PO) Take 12.5 mg by mouth at bedtime. (Patient not taking: Reported on 07/26/2020)  . gabapentin (NEURONTIN) 100 MG capsule gabapentin 100 mg capsule  Take 1 capsule 3 times a day by oral route. (Patient not taking: Reported on 07/26/2020)  . [DISCONTINUED] ibandronate (BONIVA) 150 MG tablet TAKE ONE TABLET ONCE A MONTH IN THE MORNING ON AN EMPTY STOMACH WITH WATER. REMAIN UPRIGHT.   No facility-administered encounter medications on file as of 07/26/2020.   Allergies  Allergen Reactions  . Naprosyn [Naproxen] Rash    ROS:  No fever, chills, URI symptoms, chest pain, palpitations. Sinus drainage, throat-clearing per HPI Minimal DOE, per HPI No bleeding, bruising, rash or urinary complaints. Moods are good.   PHYSICAL EXAM:  BP 138/80   Pulse 72   Ht 5\' 4"  (1.626 m)   Wt 130 lb 12.8 oz (59.3 kg)   LMP  (LMP Unknown)   BMI 22.45 kg/m   Wt Readings from Last 3 Encounters:  07/26/20 130 lb 12.8 oz (59.3 kg)  07/09/20 134 lb 3.2 oz (60.9 kg)  01/26/20 138 lb (62.6 kg)   Well developed, pleasant female in no distress. Frequent throat-clearing during visit.  HEENT: PERRL, EOMI, conjunctiva clear. Wearing mask due to COVID-19 pandemic. Neck: no lymphadenopathyorcarotid bruit. Thyroid is borderline in size, no  obvious nodule or asymmetry noted. Heart: regular rate and rhythm, no murmur Lungs: clear bilaterally, good air movement. Back: no spinal or CVA tenderness Abdomen: soft, nontender, no organomegaly or mass Extremities: no edema, 2+ pulses Neuro: alert and oriented. Normal strength, gait Psych: normal mood, affect, hygiene and grooming Skin: no visible bruising or rashes  Lab Results  Component Value Date   HGBA1C 5.8 (A) 07/26/2020    ASSESSMENT/PLAN:  Essential hypertension, benign - borderline today. Encouraged to monitor at home. Cont low Na diet, regular exercise. I suspect cough is related to allergies/PND - Plan: Comprehensive metabolic panel  Pure hypercholesterolemia - TG mildly elevated on last check. Recheck today - Plan: Lipid panel  Hypothyroidism, unspecified type - recheck TSH--some symptoms to suggest over-replacement. Answered her ? regarding thyroid and bones answered for pt - Plan: TSH  Osteoporosis without current pathological fracture, unspecified osteoporosis type - continue prolia  Medication monitoring encounter - Plan: Comprehensive metabolic panel, Lipid panel, TSH  IFG (impaired fasting glucose) - counseled re: proper diet. Discussed/answered alcohol questions. Not sure how much less sugar non-alcoholic wine has - Plan: HgB A1c  Sinus drainage - likely the cause of her throat-clearing, not ACEI.  To stay on losartanHCT  for now. Rec antihistamine and mucinex trial   Aortic atherosclerosis ([place]) - thoracic, noted on CT angio (10/2019); cont statin    Patient reports she doesn't need Synthroid refill--has plenty, 2 months at home.  Colonoscopy due--to contact Dr. Blanch Media office.  Reviewed lowfat, low carb diet. Discussed wine intake (alcohol-containing or not)--likely better to have 1-2 glasses/week of regular wine than nightly alcohol-free.  Issue wasn't with liver, just TG and glu. Encouraged her to restart weight bearing exercise. Encouraged  continued daily cardio.  F/u 6 months, as scheduled

## 2020-07-26 ENCOUNTER — Ambulatory Visit (INDEPENDENT_AMBULATORY_CARE_PROVIDER_SITE_OTHER): Payer: Medicare Other | Admitting: Family Medicine

## 2020-07-26 ENCOUNTER — Other Ambulatory Visit: Payer: Self-pay

## 2020-07-26 ENCOUNTER — Encounter: Payer: Self-pay | Admitting: Family Medicine

## 2020-07-26 VITALS — BP 138/80 | HR 72 | Ht 64.0 in | Wt 130.8 lb

## 2020-07-26 DIAGNOSIS — E039 Hypothyroidism, unspecified: Secondary | ICD-10-CM

## 2020-07-26 DIAGNOSIS — J3489 Other specified disorders of nose and nasal sinuses: Secondary | ICD-10-CM

## 2020-07-26 DIAGNOSIS — M810 Age-related osteoporosis without current pathological fracture: Secondary | ICD-10-CM | POA: Diagnosis not present

## 2020-07-26 DIAGNOSIS — E7800 Pure hypercholesterolemia, unspecified: Secondary | ICD-10-CM

## 2020-07-26 DIAGNOSIS — R7301 Impaired fasting glucose: Secondary | ICD-10-CM | POA: Diagnosis not present

## 2020-07-26 DIAGNOSIS — I10 Essential (primary) hypertension: Secondary | ICD-10-CM

## 2020-07-26 DIAGNOSIS — I700 Atherosclerosis of aorta: Secondary | ICD-10-CM

## 2020-07-26 DIAGNOSIS — Z5181 Encounter for therapeutic drug level monitoring: Secondary | ICD-10-CM | POA: Diagnosis not present

## 2020-07-26 LAB — POCT GLYCOSYLATED HEMOGLOBIN (HGB A1C): Hemoglobin A1C: 5.8 % — AB (ref 4.0–5.6)

## 2020-07-26 NOTE — Patient Instructions (Addendum)
Consider trying claritin (or allegra or zyrtec) and mucinex (plain) to see if that helps with the drainage and throat-clearing.  If this doesn't work ,consider treating for reflux daily to see if that is also contributing to the cough/throat-clearing.  Contact Dr. Blanch Media office to see about your colonoscopy (to schedule, or to know if guidelines changed).

## 2020-07-27 ENCOUNTER — Encounter: Payer: Self-pay | Admitting: Internal Medicine

## 2020-07-27 LAB — COMPREHENSIVE METABOLIC PANEL
ALT: 29 IU/L (ref 0–32)
AST: 26 IU/L (ref 0–40)
Albumin/Globulin Ratio: 2 (ref 1.2–2.2)
Albumin: 4.7 g/dL (ref 3.7–4.7)
Alkaline Phosphatase: 45 IU/L (ref 44–121)
BUN/Creatinine Ratio: 19 (ref 12–28)
BUN: 15 mg/dL (ref 8–27)
Bilirubin Total: 0.5 mg/dL (ref 0.0–1.2)
CO2: 25 mmol/L (ref 20–29)
Calcium: 9.6 mg/dL (ref 8.7–10.3)
Chloride: 101 mmol/L (ref 96–106)
Creatinine, Ser: 0.81 mg/dL (ref 0.57–1.00)
GFR calc Af Amer: 83 mL/min/{1.73_m2} (ref >59)
GFR calc non Af Amer: 72 mL/min/{1.73_m2} (ref >59)
Globulin, Total: 2.3 g/dL (ref 1.5–4.5)
Glucose: 96 mg/dL (ref 65–99)
Potassium: 4.7 mmol/L (ref 3.5–5.2)
Sodium: 139 mmol/L (ref 134–144)
Total Protein: 7 g/dL (ref 6.0–8.5)

## 2020-07-27 LAB — TSH: TSH: 2.93 u[IU]/mL (ref 0.450–4.500)

## 2020-07-27 LAB — LIPID PANEL
Chol/HDL Ratio: 2.4 ratio (ref 0.0–4.4)
Cholesterol, Total: 149 mg/dL (ref 100–199)
HDL: 61 mg/dL (ref >39)
LDL Chol Calc (NIH): 66 mg/dL (ref 0–99)
Triglycerides: 125 mg/dL (ref 0–149)
VLDL Cholesterol Cal: 22 mg/dL (ref 5–40)

## 2020-07-30 ENCOUNTER — Encounter: Payer: Self-pay | Admitting: Family Medicine

## 2020-07-30 DIAGNOSIS — I700 Atherosclerosis of aorta: Secondary | ICD-10-CM | POA: Insufficient documentation

## 2020-08-01 ENCOUNTER — Encounter: Payer: Self-pay | Admitting: Family Medicine

## 2020-08-16 DIAGNOSIS — Z20822 Contact with and (suspected) exposure to covid-19: Secondary | ICD-10-CM | POA: Diagnosis not present

## 2020-08-23 ENCOUNTER — Encounter: Payer: Self-pay | Admitting: Family Medicine

## 2020-08-23 ENCOUNTER — Other Ambulatory Visit: Payer: Self-pay | Admitting: Podiatry

## 2020-08-23 DIAGNOSIS — G5781 Other specified mononeuropathies of right lower limb: Secondary | ICD-10-CM

## 2020-08-23 DIAGNOSIS — G5761 Lesion of plantar nerve, right lower limb: Secondary | ICD-10-CM

## 2020-08-24 ENCOUNTER — Telehealth: Payer: Self-pay | Admitting: Family Medicine

## 2020-08-24 NOTE — Telephone Encounter (Signed)
Left message for pt to call. Dorothy Rojas needs to speak to her concerning PROLIA

## 2020-08-28 ENCOUNTER — Ambulatory Visit: Payer: BLUE CROSS/BLUE SHIELD | Admitting: Podiatry

## 2020-09-04 ENCOUNTER — Encounter: Payer: Self-pay | Admitting: Family Medicine

## 2020-09-09 ENCOUNTER — Other Ambulatory Visit: Payer: Self-pay | Admitting: Family Medicine

## 2020-09-09 DIAGNOSIS — E7800 Pure hypercholesterolemia, unspecified: Secondary | ICD-10-CM

## 2020-09-10 ENCOUNTER — Other Ambulatory Visit (INDEPENDENT_AMBULATORY_CARE_PROVIDER_SITE_OTHER): Payer: Medicare Other

## 2020-09-10 ENCOUNTER — Other Ambulatory Visit: Payer: Self-pay

## 2020-09-10 DIAGNOSIS — M810 Age-related osteoporosis without current pathological fracture: Secondary | ICD-10-CM

## 2020-09-10 MED ORDER — DENOSUMAB 60 MG/ML ~~LOC~~ SOSY
60.0000 mg | PREFILLED_SYRINGE | Freq: Once | SUBCUTANEOUS | Status: AC
Start: 2020-09-10 — End: 2020-09-10
  Administered 2020-09-10: 60 mg via SUBCUTANEOUS

## 2020-09-13 ENCOUNTER — Ambulatory Visit: Payer: BLUE CROSS/BLUE SHIELD | Admitting: Cardiology

## 2020-09-17 ENCOUNTER — Other Ambulatory Visit: Payer: Self-pay

## 2020-09-17 ENCOUNTER — Ambulatory Visit (AMBULATORY_SURGERY_CENTER): Payer: Self-pay | Admitting: *Deleted

## 2020-09-17 VITALS — Ht 64.0 in | Wt 134.0 lb

## 2020-09-17 DIAGNOSIS — Z86010 Personal history of colonic polyps: Secondary | ICD-10-CM

## 2020-09-17 MED ORDER — SUTAB 1479-225-188 MG PO TABS: 24.0000 | ORAL_TABLET | ORAL | 0 refills | Status: DC

## 2020-09-17 NOTE — Progress Notes (Signed)
No egg or soy allergy known to patient  No issues with past sedation with any surgeries or procedures No intubation problems in the past  No FH of Malignant Hyperthermia No diet pills per patient No home 02 use per patient  No blood thinners per patient  Pt denies issues with constipation - pt has a hx of chronic constipation but currently has a soft daily BM - occ 2-3 x a day  No A fib or A flutter  EMMI video to pt or via [place] 19 guidelines implemented in [place] today with Pt and RN  Pt is fully vaccinated  for [employer] given to pt in PV today , Code to Pharmacy and  NO PA's for preps discussed with pt In PV today   Due to the COVID-19 pandemic we are asking patients to follow certain guidelines.  Pt aware of COVID protocols and [facility] guidelines

## 2020-09-18 DIAGNOSIS — I2510 Atherosclerotic heart disease of native coronary artery without angina pectoris: Secondary | ICD-10-CM | POA: Diagnosis not present

## 2020-09-18 DIAGNOSIS — R55 Syncope and collapse: Secondary | ICD-10-CM | POA: Diagnosis not present

## 2020-09-18 DIAGNOSIS — I10 Essential (primary) hypertension: Secondary | ICD-10-CM | POA: Diagnosis not present

## 2020-09-18 DIAGNOSIS — E782 Mixed hyperlipidemia: Secondary | ICD-10-CM | POA: Diagnosis not present

## 2020-09-18 DIAGNOSIS — Z951 Presence of aortocoronary bypass graft: Secondary | ICD-10-CM | POA: Diagnosis not present

## 2020-09-18 DIAGNOSIS — N1831 Chronic kidney disease, stage 3a: Secondary | ICD-10-CM | POA: Diagnosis not present

## 2020-09-24 ENCOUNTER — Other Ambulatory Visit: Payer: Self-pay | Admitting: Family Medicine

## 2020-09-24 DIAGNOSIS — E039 Hypothyroidism, unspecified: Secondary | ICD-10-CM

## 2020-10-01 ENCOUNTER — Other Ambulatory Visit: Payer: Self-pay

## 2020-10-01 ENCOUNTER — Encounter: Payer: Self-pay | Admitting: Family Medicine

## 2020-10-01 ENCOUNTER — Encounter: Payer: Self-pay | Admitting: Internal Medicine

## 2020-10-01 ENCOUNTER — Ambulatory Visit (AMBULATORY_SURGERY_CENTER): Payer: Medicare Other | Admitting: Internal Medicine

## 2020-10-01 VITALS — BP 122/55 | HR 69 | Temp 96.8°F | Resp 16 | Ht 64.0 in | Wt 134.0 lb

## 2020-10-01 DIAGNOSIS — Z86010 Personal history of colonic polyps: Secondary | ICD-10-CM | POA: Diagnosis not present

## 2020-10-01 DIAGNOSIS — D122 Benign neoplasm of ascending colon: Secondary | ICD-10-CM

## 2020-10-01 DIAGNOSIS — D125 Benign neoplasm of sigmoid colon: Secondary | ICD-10-CM | POA: Diagnosis not present

## 2020-10-01 MED ORDER — SODIUM CHLORIDE 0.9 % IV SOLN: 500.0000 mL | Freq: Once | INTRAVENOUS | Status: DC

## 2020-10-01 NOTE — Patient Instructions (Signed)
Handout on polyps and diverticulosis provided ° °Await pathology results. ° °YOU HAD AN ENDOSCOPIC PROCEDURE TODAY AT THE [facility]:   Refer to the procedure report that was given to you for any specific questions about what was found during the examination.  If the procedure report does not answer your questions, please call your gastroenterologist to clarify.  If you requested that your care partner not be given the details of your procedure findings, then the procedure report has been included in a sealed envelope for you to review at your convenience later. ° °YOU SHOULD EXPECT: Some feelings of bloating in the abdomen. Passage of more gas than usual.  Walking can help get rid of the air that was put into your GI tract during the procedure and reduce the bloating. If you had a lower endoscopy (such as a colonoscopy or flexible sigmoidoscopy) you may notice spotting of blood in your stool or on the toilet paper. If you underwent a bowel prep for your procedure, you may not have a normal bowel movement for a few days. ° °Please Note:  You might notice some irritation and congestion in your nose or some drainage.  This is from the oxygen used during your procedure.  There is no need for concern and it should clear up in a day or so. ° °SYMPTOMS TO REPORT IMMEDIATELY: ° °Following lower endoscopy (colonoscopy or flexible sigmoidoscopy): ° Excessive amounts of blood in the stool ° Significant tenderness or worsening of abdominal pains ° Swelling of the abdomen that is new, acute ° Fever of 100°F or higher ° °For urgent or emergent issues, a gastroenterologist can be reached at any hour by calling [phone]. °Do not use MyChart messaging for urgent concerns.  ° ° °DIET:  We do recommend a small meal at first, but then you may proceed to your regular diet.  Drink plenty of fluids but you should avoid alcoholic beverages for 24 hours. ° °ACTIVITY:  You should plan to take it easy for the rest of  today and you should NOT DRIVE or use heavy machinery until tomorrow (because of the sedation medicines used during the test).   ° °FOLLOW UP: °Our staff will call the number listed on your records 48-72 hours following your procedure to check on you and address any questions or concerns that you may have regarding the information given to you following your procedure. If we do not reach you, we will leave a message.  We will attempt to reach you two times.  During this call, we will ask if you have developed any symptoms of COVID 19. If you develop any symptoms (ie: fever, flu-like symptoms, shortness of breath, cough etc.) before then, please call [phone].  If you test positive for Covid 19 in the 2 weeks post procedure, please call and report this information to us.   ° °If any biopsies were taken you will be contacted by phone or by letter within the next 1-3 weeks.  Please call us at [phone] if you have not heard about the biopsies in 3 weeks.  ° ° °SIGNATURES/CONFIDENTIALITY: °You and/or your care partner have signed paperwork which will be entered into your electronic medical record.  These signatures attest to the fact that that the information above on your After Visit Summary has been reviewed and is understood.  Full responsibility of the confidentiality of this discharge information lies with you and/or your care-partner. ° °

## 2020-10-01 NOTE — Progress Notes (Signed)
To PACU, VSS. Report to [email or web address] 

## 2020-10-01 NOTE — Progress Notes (Signed)
Called to room to assist during endoscopic procedure.  Patient ID and intended procedure confirmed with present staff. Received instructions for my participation in the procedure from the performing physician.  

## 2020-10-01 NOTE — Op Note (Signed)
[place] Patient Name: Dorothy Rojas Procedure Date: 10/01/2020 7:58 AM MRN: [id number] Endoscopist: Docia Chuck. Henrene Pastor , MD Age: 75 Referring MD:  Date of Birth: May 31, 1946 Gender: Female Account #: [id number] Procedure:                Colonoscopy with cold snare polypectomy x 3 Indications:              High risk colon cancer surveillance: Personal                            history of non-advanced adenoma. Previous exams                            2007 (Dr. Lajoyce Corners), November 2016 Medicines:                Monitored Anesthesia Care Procedure:                Pre-Anesthesia Assessment:                           - Prior to the procedure, a History and Physical                            was performed, and patient medications and                            allergies were reviewed. The patient's tolerance of                            previous anesthesia was also reviewed. The risks                            and benefits of the procedure and the sedation                            options and risks were discussed with the patient.                            All questions were answered, and informed consent                            was obtained. Prior Anticoagulants: The patient has                            taken no previous anticoagulant or antiplatelet                            agents. After reviewing the risks and benefits, the                            patient was deemed in satisfactory condition to                            undergo the procedure.  After obtaining informed consent, the colonoscope                            was passed under direct vision. Throughout the                            procedure, the patient's blood pressure, pulse, and                            oxygen saturations were monitored continuously. The                            Olympus [id number]) [id number] was introduced                            through the anus  and advanced to the the cecum,                            identified by appendiceal orifice and ileocecal                            valve. The ileocecal valve, appendiceal orifice,                            and rectum were photographed. The quality of the                            bowel preparation was good. The colonoscopy was                            performed without difficulty. The patient tolerated                            the procedure well. The bowel preparation used was                            SUPREP/tablets via split dose instruction. Scope In: 8:07:56 AM Scope Out: 8:26:53 AM Scope Withdrawal Time: 0 hours 14 minutes 15 seconds  Total Procedure Duration: 0 hours 18 minutes 57 seconds  Findings:                 Three polyps were found in the sigmoid colon and                            ascending colon. The polyps were 1 to 2 mm in size.                            These polyps were removed with a cold snare.                            Resection and retrieval were complete.                           Diverticula were found  in the sigmoid colon.                           The exam was otherwise without abnormality on                            direct and retroflexion views. Complications:            No immediate complications. Estimated blood loss:                            None. Estimated Blood Loss:     Estimated blood loss: none. Impression:               - Three 1 to 2 mm polyps in the sigmoid colon and                            in the ascending colon, removed with a cold snare.                            Resected and retrieved.                           - Diverticulosis in the sigmoid colon.                           - The examination was otherwise normal. Rectal                            vault too narrow for retroflexion. Recommendation:           - Repeat colonoscopy in 5 years for surveillance                            (PEDIATRIC SCOPE).                            - Patient has a contact number available for                            emergencies. The signs and symptoms of potential                            delayed complications were discussed with the                            patient. Return to normal activities tomorrow.                            Written discharge instructions were provided to the                            patient.                           - Resume previous diet.                           -  Continue present medications.                           - Await pathology results. Docia Chuck. Henrene Pastor, MD 10/01/2020 8:35:35 AM This report has been signed electronically.

## 2020-10-03 ENCOUNTER — Encounter: Payer: Self-pay | Admitting: Student

## 2020-10-03 ENCOUNTER — Ambulatory Visit: Payer: BLUE CROSS/BLUE SHIELD | Admitting: Cardiology

## 2020-10-03 ENCOUNTER — Ambulatory Visit: Payer: Medicare Other | Admitting: Student

## 2020-10-03 ENCOUNTER — Telehealth: Payer: Self-pay

## 2020-10-03 ENCOUNTER — Other Ambulatory Visit: Payer: Self-pay

## 2020-10-03 VITALS — BP 165/72 | HR 77 | Temp 97.4°F | Resp 17 | Ht 64.0 in | Wt 133.6 lb

## 2020-10-03 DIAGNOSIS — R0609 Other forms of dyspnea: Secondary | ICD-10-CM | POA: Diagnosis not present

## 2020-10-03 DIAGNOSIS — I10 Essential (primary) hypertension: Secondary | ICD-10-CM | POA: Diagnosis not present

## 2020-10-03 DIAGNOSIS — R0600 Dyspnea, unspecified: Secondary | ICD-10-CM

## 2020-10-03 MED ORDER — LOSARTAN POTASSIUM-HCTZ 100-12.5 MG PO TABS: 1.0000 | ORAL_TABLET | Freq: Every day | ORAL | 3 refills | Status: DC

## 2020-10-03 MED ORDER — AMLODIPINE BESYLATE 5 MG PO TABS: 5.0000 mg | ORAL_TABLET | Freq: Every evening | ORAL | 3 refills | Status: DC

## 2020-10-03 NOTE — Telephone Encounter (Signed)
LVM

## 2020-10-03 NOTE — Progress Notes (Signed)
Primary Physician/Referring:  [name], MD  Patient ID: Dorothy Rojas, female    DOB: 29-Oct-1945, 75 y.o.   MRN: [id number]  Chief Complaint  Patient presents with  . Abnormal ECG  . Shortness of Breath   HPI:    Dorothy Rojas  is a 75 y.o. Caucasian female with chronic dyspnea on exertion, prior history of tobacco use disorder approximately 15 to 20 pack year history quit in 1986, hypertension, hyperlipidemia, hyperglycemia and a family history of premature coronary artery disease, history of DVT and factor V Leyden positive in 2014. Pulmonary evaluation has not reveled etiology of her symptoms, CT scan negative for PE in 2021. Patient with normal nuclear stress test 01/25/2020.  Patient presents for follow up of dyspnea which has remained stable and she has no new symptomatology. She is active, exercising on a daily basis without issue. Reports exercising seems to be improving dyspnea. Denies chest pain, palpitations, leg swelling, syncope, near-syncope.   Past Medical History:  Diagnosis Date  . Allergy   . Anemia   . Arthritis    knees  . Cataract    removed both eyes   . Chronic constipation   . Clotting disorder ([place])    DVT 2014  . Colon polyp 06/2015   tubular adenoma (sigmoid)  . Diverticulosis    seen on colonoscopy 06/2015  . GERD (gastroesophageal reflux disease)    normal EGD 06/2015  . Heart murmur    as a child   . Heterozygous factor V Leiden mutation [facility]) dx 07/2013     hematologist--  dr Bernadene Bell ([facility])--  per note "Low Risk"  . History of DVT of lower extremity    dx 07-28-2013  left lower extrem.  --  xarelto for 6 months  . Hyperlipidemia   . Hypertension   . Low back pain 06/2020   MRI per ortho--mild-mod L4-5 spinal stenosis, mod-severe R foraminal stenosis, medial diasplacement of R L5. Mod-severe facet arthrosis L4-5, L5-S1  . Lymphadenopathy, inguinal    bilateral  . Microscopic hematuria    chronic   . Multinodular  goiter   . Multinodular thyroid    a. Path benign 2013.  [name] OAB (overactive bladder)   . Osteoporosis 2017   osteopenia since 2011; osteoporosis 12/2015  . Pre-diabetes    no meds, managaed with lifestyle changes   . Prediabetes   . SUI (stress urinary incontinence, female)   . Tricuspid regurgitation    mild-mod, noted on echo 03/2016   Past Surgical History:  Procedure Laterality Date  . APPENDECTOMY  1973  . CATARACT EXTRACTION W/ INTRAOCULAR LENS  IMPLANT, BILATERAL Bilateral 2015  . COLONOSCOPY    . CYSTOCELE REPAIR  12-03-2010  . CYSTOSCOPY MACROPLASTIQUE IMPLANT N/A 12/12/2014   Procedure: CYSTOSCOPY MACROPLASTIQUE INJECTION;  Surgeon: Bjorn Loser, MD;  Location: [facility];  Service: Urology;  Laterality: N/A;  . CYSTOSCOPY MACROPLASTIQUE IMPLANT N/A 04/12/2015   Procedure: CYSTOSCOPY MACROPLASTIQUE IMPLANT;  Surgeon: Bjorn Loser, MD;  Location: [facility];  Service: Urology;  Laterality: N/A;  . INCONTINENCE SURGERY  04/1999   Re-do at [facility] for sling complications in [id number]  . KNEE ARTHROSCOPY Right 04-05-2013  . LATERAL EPICONDYLE RELEASE Right   . NEUROMA SURGERY Bilateral 1980's    feet  . OVARIAN CYST REMOVAL Left 1973  . POLYPECTOMY    . PUBOVAGINAL SLING N/A 07/30/2015   Procedure: PELVIC EXAM UNDER ANESTHESIA, TRANSVAGINAL BIOPSY OF PERIURETHRAL MASS;  Surgeon:  Raynelle Bring, MD;  Location: [facility];  Service: Urology;  Laterality: N/A;  60 MINS   . TONSILLECTOMY  as child  . TOTAL ABDOMINAL HYSTERECTOMY W/ BILATERAL SALPINGOOPHORECTOMY  09/  2000  . TOTAL KNEE ARTHROPLASTY Left 09/21/2017   Procedure: LEFT TOTAL KNEE ARTHROPLASTY;  Surgeon: Paralee Cancel, MD;  Location: [facility];  Service: Orthopedics;  Laterality: Left;  90 mins  . TRANSTHORACIC ECHOCARDIOGRAM  05-04-2014   normal ,  ef 60-65%  . UPPER GASTROINTESTINAL ENDOSCOPY     Family History  Problem Relation Age of Onset  . Hypertension Father   . Heart disease Father 61        CABG, died from MI at 75  . COPD Father   . Cirrhosis Mother        related to psoriasis medications  . Diabetes Mother   . Psoriasis Mother   . Osteogenesis imperfecta Sister   . Heart disease Sister        CHF  . Diabetes Brother   . Heart disease Brother 75       MI  . Deep vein thrombosis Brother        factor V Leiden NEGATIVE  . Cancer Brother 71       bile duct and liver  . Stomach cancer Brother   . Colon cancer Neg Hx   . Colon polyps Neg Hx   . Esophageal cancer Neg Hx   . Rectal cancer Neg Hx     Social History   Tobacco Use  . Smoking status: Former Smoker    Packs/day: 1.00    Years: 21.00    Pack years: 21.00    Types: Cigarettes    Quit date: 08/12/1983    Years since quitting: 37.1  . Smokeless tobacco: Never Used  Substance Use Topics  . Alcohol use: Yes    Alcohol/week: 0.0 standard drinks    Comment: 1 glass of wine most days of the week (5oz)   Marital Status: Married ROS  Review of Systems  Constitutional: Negative for malaise/fatigue and weight gain.  Cardiovascular: Positive for dyspnea on exertion (chronic and stable). Negative for chest pain, claudication, leg swelling, near-syncope, orthopnea, palpitations, paroxysmal nocturnal dyspnea and syncope.  Respiratory: Negative for shortness of breath.   Hematologic/Lymphatic: Does not bruise/bleed easily.  Musculoskeletal: Positive for arthritis (knee).  Gastrointestinal: Negative for hematochezia and melena.  Neurological: Negative for dizziness and weakness.   Objective  Blood pressure (!) 165/72, pulse 77, temperature (!) 97.4 F (36.3 C), temperature source Temporal, resp. rate 17, height 5\' 4"  (1.626 m), weight 133 lb 9.6 oz (60.6 kg), SpO2 100 %.  Vitals with BMI 10/03/2020 10/03/2020 10/01/2020  Height - 5\' 4"  -  Weight - 133 lbs 10 oz -  BMI - 38.75 -  Systolic 643 329 518  Diastolic 72 72 55  Pulse 77 76 69     Physical Exam Vitals reviewed.  HENT:     Head: Normocephalic  and atraumatic.  Neck:     Thyroid: No thyromegaly.  Cardiovascular:     Rate and Rhythm: Normal rate and regular rhythm.     Pulses: Intact distal pulses.     Heart sounds: Normal heart sounds, S1 normal and S2 normal. No murmur heard. No gallop.      Comments: No leg edema, no JVD. Pulmonary:     Effort: Pulmonary effort is normal. No respiratory distress.     Breath sounds: Normal breath sounds. No wheezing, rhonchi or  rales.  Abdominal:     General: Bowel sounds are normal.     Palpations: Abdomen is soft.  Musculoskeletal:     Cervical back: Neck supple.     Right lower leg: No edema.     Left lower leg: No edema.  Skin:    General: Skin is warm and dry.  Neurological:     Mental Status: She is alert.    Laboratory examination:   Recent Labs    10/14/19 0844 01/24/20 0828 07/26/20 1111  NA 141 141 139  K 3.8 4.8 4.7  CL 104 101 101  CO2 30 25 25   GLUCOSE 92 103* 96  BUN 14 14 15   CREATININE 0.84 0.93 0.81  CALCIUM 9.5 9.9 9.6  GFRNONAA  --  61 72  GFRAA  --  71 83   CrCl cannot be calculated (Patient's most recent lab result is older than the maximum 21 days allowed.).  CMP Latest Ref Rng & Units 07/26/2020 03/08/2020 01/24/2020  Glucose 65 - 99 mg/dL 96 - 103(H)  BUN 8 - 27 mg/dL 15 - 14  Creatinine 0.57 - 1.00 mg/dL 0.81 - 0.93  Sodium 134 - 144 mmol/L 139 - 141  Potassium 3.5 - 5.2 mmol/L 4.7 - 4.8  Chloride 96 - 106 mmol/L 101 - 101  CO2 20 - 29 mmol/L 25 - 25  Calcium 8.7 - 10.3 mg/dL 9.6 - 9.9  Total Protein 6.0 - 8.5 g/dL 7.0 6.9 7.0  Total Bilirubin 0.0 - 1.2 mg/dL 0.5 0.5 0.6  Alkaline Phos 44 - 121 IU/L 45 50 49  AST 0 - 40 IU/L 26 24 78(H)  ALT 0 - 32 IU/L 29 25 58(H)   CBC Latest Ref Rng & Units 01/24/2020 10/13/2019 01/10/2019  WBC 3.4 - 10.8 x10E3/uL 6.7 6.5 6.2  Hemoglobin 11.1 - 15.9 g/dL 12.6 12.2 12.5  Hematocrit 34.0 - 46.6 % 38.7 35.6(L) 36.3  Platelets 150 - 450 x10E3/uL 230 199.0 232   Lipid Panel     Component Value Date/Time    CHOL 149 07/26/2020 1111   TRIG 125 07/26/2020 1111   HDL 61 07/26/2020 1111   CHOLHDL 2.4 07/26/2020 1111   CHOLHDL 3.0 06/11/2017 0851   VLDL 37 (H) 12/04/2016 0713   LDLCALC 66 07/26/2020 1111   LDLCALC 72 06/11/2017 0851   HEMOGLOBIN A1C Lab Results  Component Value Date   HGBA1C 5.8 (A) 07/26/2020   MPG 128.37 09/15/2017   TSH Recent Labs    01/24/20 0828 03/08/20 0824 07/26/20 1111  TSH 4.780* 0.951 2.930   Medications and allergies   Allergies  Allergen Reactions  . Naprosyn [Naproxen] Rash     Current Outpatient Medications  Medication Instructions  . amLODipine (NORVASC) 5 mg, Oral, Every evening  . aspirin 81 mg, Oral, Daily  . CALCIUM-MAGNESIUM-VITAMIN D PO 1 tablet, Oral, Daily  . denosumab (PROLIA) 60 MG/ML SOSY injection Prolia 60 mg/mL subcutaneous syringe  . diphenhydrAMINE HCl (BENADRYL PO) 12.5 mg, Oral, Daily at bedtime  . losartan-hydrochlorothiazide (HYZAAR) 100-12.5 MG tablet 1 tablet, Oral, Daily  . Multiple Vitamins-Minerals (MULTIVITAMIN WITH MINERALS) tablet 1 tablet, Oral, Daily  . nitrofurantoin (MACRODANTIN) 100 mg, Oral, Daily  . simvastatin (ZOCOR) 40 MG tablet TAKE ONE TABLET AT BEDTIME.  . SYNTHROID 50 MCG tablet TAKE 1 TABLET DAILY BEFORE BREAKFAST FOR HYPOTHYROIDISM  . VITAMIN D PO 1,000 mg, Oral, Daily    Radiology:   Cardiac CTA 05/04/2014: Calcium score 0, no plaque, normal coronary arteries, right dominant  circulation.  CT Angio Chest 10/14/2019: 1. No CT findings for pulmonary embolism. 2. Thoracic aortic calcifications but no dissection or aneurysm. 3. No acute pulmonary findings. Aortic Atherosclerosis   Cardiac Studies:   PCV MYOCARDIAL PERFUSION WO LEXISCAN 01/25/2020 Exercise nuclear stress test was performed using Bruce protocol. Patient reached 7 METS, and 90% of age predicted maximum heart rate. Exercise capacity was low. Chest pain not reported. Dyspnea , fatigue reported. Heart rate and hemodynamic response were  normal. Interpretation of stress EKG limited due to significant baseline artifact. Normal myocardial perfusion. Stress LVEF 82%. Low risk study.  PCV ECHOCARDIOGRAM COMPLETE 72/04/4708 Normal LV systolic function with visual EF 60-65%. Left ventricle cavity is normal in size. Mild left ventricular hypertrophy. Normal global wall motion. Normal diastolic filling pattern, normal LAP. Mild (Grade I) mitral regurgitation. Mild tricuspid regurgitation. No evidence of pulmonary hypertension. Compared to prior study dated 04/09/2016 08/19/2019, no significant changes noted.  EKG   EKG 10/02/2020: Sinus rhythm at a rate of 70 bpm.  Normal axis.  No evidence of ischemia or underlying injury pattern. Compared to EKG 09/14/2019, no ST depressions noted.   09/14/2019: Normal sinus rhythm at rate of 81 bpm,  nonspecific inferior and lateral 2 mm sagging ST depressions. Compared to  EKG 03/26/2016, ST changes new.   Assessment     ICD-10-CM   1. Essential hypertension  I10 EKG 12-Lead    losartan-hydrochlorothiazide (HYZAAR) 100-12.5 MG tablet  2. Dyspnea on exertion  R06.00     Meds ordered this encounter  Medications  . losartan-hydrochlorothiazide (HYZAAR) 100-12.5 MG tablet    Sig: Take 1 tablet by mouth daily.    Dispense:  90 tablet    Refill:  3    Discontinue lisinopril - Cough  . amLODipine (NORVASC) 5 MG tablet    Sig: Take 1 tablet (5 mg total) by mouth every evening.    Dispense:  90 tablet    Refill:  3    Medications Discontinued During This Encounter  Medication Reason  . losartan-hydrochlorothiazide (HYZAAR) 100-12.5 MG tablet Reorder    Recommendations:   ARDEN AXON  is a 75 y.o. Caucasian female with chronic dyspnea on exertion, prior history of tobacco use disorder approximately 15 to 20 pack year history quit in 1986, hypertension, hyperlipidemia, hyperglycemia and a family history of premature coronary artery disease.   Patient presents for follow-up of dyspnea on  exertion which is chronic and relatively stable although she reports since exercising daily dyspnea has mildly improved.  Etiology of dyspnea is questionable, suspect deconditioning as cardiac and pulmonary evaluations have been unyielding.  In regard to hypertension patient is tolerating losartan/hydrochlorthiazide without issue.  Although her blood pressure remains elevated above goal.  We will add amlodipine 5 mg daily.  Patient will monitor her blood pressure on a regular basis and notify our office if it remains >130/80 mmHg.  Follow-up in 1 year, sooner if needed for hypertension, hyperlipidemia.   Alethia Berthold, PA-C 10/03/2020, 5:46 PM Office: [phone]

## 2020-10-03 NOTE — Patient Instructions (Signed)
Let us know if in 1 month your blood pressure is still >130/80 mmHg.

## 2020-10-09 ENCOUNTER — Encounter: Payer: Self-pay | Admitting: Internal Medicine

## 2020-10-24 DIAGNOSIS — M7918 Myalgia, other site: Secondary | ICD-10-CM | POA: Diagnosis not present

## 2020-10-24 DIAGNOSIS — M25551 Pain in right hip: Secondary | ICD-10-CM | POA: Diagnosis not present

## 2020-10-24 DIAGNOSIS — M5459 Other low back pain: Secondary | ICD-10-CM | POA: Diagnosis not present

## 2020-10-24 DIAGNOSIS — M48062 Spinal stenosis, lumbar region with neurogenic claudication: Secondary | ICD-10-CM | POA: Diagnosis not present

## 2020-10-25 ENCOUNTER — Encounter: Payer: Self-pay | Admitting: Family Medicine

## 2020-10-30 DIAGNOSIS — L820 Inflamed seborrheic keratosis: Secondary | ICD-10-CM | POA: Diagnosis not present

## 2020-10-30 DIAGNOSIS — D485 Neoplasm of uncertain behavior of skin: Secondary | ICD-10-CM | POA: Diagnosis not present

## 2020-10-31 DIAGNOSIS — M5416 Radiculopathy, lumbar region: Secondary | ICD-10-CM | POA: Diagnosis not present

## 2020-11-02 ENCOUNTER — Encounter: Payer: Self-pay | Admitting: Family Medicine

## 2020-11-05 DIAGNOSIS — M5416 Radiculopathy, lumbar region: Secondary | ICD-10-CM | POA: Diagnosis not present

## 2020-11-08 ENCOUNTER — Encounter: Payer: Self-pay | Admitting: Family Medicine

## 2020-11-13 DIAGNOSIS — M5417 Radiculopathy, lumbosacral region: Secondary | ICD-10-CM | POA: Diagnosis not present

## 2020-11-15 DIAGNOSIS — M5416 Radiculopathy, lumbar region: Secondary | ICD-10-CM | POA: Diagnosis not present

## 2020-11-17 DIAGNOSIS — Z23 Encounter for immunization: Secondary | ICD-10-CM | POA: Diagnosis not present

## 2020-11-19 DIAGNOSIS — M5416 Radiculopathy, lumbar region: Secondary | ICD-10-CM | POA: Diagnosis not present

## 2020-11-20 ENCOUNTER — Encounter: Payer: Self-pay | Admitting: Family Medicine

## 2020-11-21 DIAGNOSIS — M5416 Radiculopathy, lumbar region: Secondary | ICD-10-CM | POA: Diagnosis not present

## 2020-11-22 DIAGNOSIS — M5416 Radiculopathy, lumbar region: Secondary | ICD-10-CM | POA: Diagnosis not present

## 2020-11-26 DIAGNOSIS — M5416 Radiculopathy, lumbar region: Secondary | ICD-10-CM | POA: Diagnosis not present

## 2020-11-27 DIAGNOSIS — S93491A Sprain of other ligament of right ankle, initial encounter: Secondary | ICD-10-CM | POA: Diagnosis not present

## 2020-11-29 DIAGNOSIS — M5416 Radiculopathy, lumbar region: Secondary | ICD-10-CM | POA: Diagnosis not present

## 2020-12-01 ENCOUNTER — Encounter: Payer: Self-pay | Admitting: Family Medicine

## 2020-12-03 ENCOUNTER — Ambulatory Visit: Payer: Medicare Other | Admitting: Family Medicine

## 2020-12-05 ENCOUNTER — Encounter: Payer: Self-pay | Admitting: Family Medicine

## 2020-12-05 ENCOUNTER — Ambulatory Visit (INDEPENDENT_AMBULATORY_CARE_PROVIDER_SITE_OTHER): Payer: Medicare Other | Admitting: Family Medicine

## 2020-12-05 ENCOUNTER — Other Ambulatory Visit: Payer: Self-pay

## 2020-12-05 VITALS — BP 128/68 | HR 72 | Ht 64.0 in | Wt 138.4 lb

## 2020-12-05 DIAGNOSIS — M5416 Radiculopathy, lumbar region: Secondary | ICD-10-CM

## 2020-12-05 DIAGNOSIS — M7671 Peroneal tendinitis, right leg: Secondary | ICD-10-CM

## 2020-12-05 DIAGNOSIS — R609 Edema, unspecified: Secondary | ICD-10-CM

## 2020-12-05 DIAGNOSIS — I10 Essential (primary) hypertension: Secondary | ICD-10-CM

## 2020-12-05 NOTE — Progress Notes (Signed)
Chief Complaint  Patient presents with  . Consult    Back issues, swollen ankles and feet. Saw Dr French Ana @ [facility] for ankle pain. (brought note). Has been icing and elevating her legs and by the end of the day her ankles are very swollen, wonders if there is a medication that could help-something else she can do? Worries about clots. Takes gabapentin, her back is some better-going for second injection tomorrow. Dr. French Ana put her on meloxicam, nothing helps her foot pain.     She has been getting PT for back--noted pain in ankle.  Saw Dr. French Ana, diagnosed with peroneal tendonitis. She was prescribed meloxicam, and recommended equalizer boot--she finds the strap makes swelling worse. She told ortho, wearing an ankle support instead (compression).  Her chief complaint is that of swelling in both feet and ankles over the last couple of weeks. She is used to having skinny calves and ankles, and this is upsetting her. No change in sodium intake. No pain.  Swelling is improved, but not completely, by morning.  No shortness of breath. Not going to gym bc of R ankle/foot and back Walking some, less often.  Takes amlodipine, started 2/23 by cardiology. Review of chart after visit--she reported complaints of ankle swelling for more than the 2 weeks she reported at visit (MyChart message sent 3/17)  Back pain:  She reports that the 1st injection didn't help.  Scheduled for 2nd tomorrow. PT has helped some (will restart after injection). Taking gabapentin 300mg  TID. Some brain fog noted  Records reviewed: 4/19 Dr. French Ana (as reported above, boot, NSAID, f/u 10d), may need MRI. 4/18 PT--6 sessions, no functional improvements, fair progress.  To continue PT 4/13 Levy Pupa, PA--f/u back pain, will change to transforaminal approach instead of interlaminar approach for next injection. 3/27 (note brought in by patient today)--Dr. Alvan Dame. Multifactorial lumbar stenosis, R>L with neurogenic  claudication complaints. Lack of response to Gabapentin, recommended epidural injections and PT, cont gabapentin. MRI 06/25/2020: mild-mod L4-5 spinal canal stenosis with mod-severe R and mild-mod L subarticular zone narrowing, impinging upon with ventral and medial displacement of descending R L5 nerve roots due to small R anteromedially directed synovial cyst with moderate thickening of ligamentum flavum and bulging disc asymmetric to the right.  Mild R neural foraminal narrowing.  Mild L3-4 spinal canal stenosis.  Degenerative minimal anterolisthesis of L4 on L5 and L5 on S1 with mod-severe R and moderate L L4-5 and mod-severe L5-S1 facet arthrosis.  PMH, PSH, SH reviewed  Outpatient Encounter Medications as of 12/05/2020  Medication Sig  . amLODipine (NORVASC) 5 MG tablet Take 1 tablet (5 mg total) by mouth every evening.  [name] aspirin 81 MG chewable tablet Chew 81 mg by mouth daily.  [name] CALCIUM-MAGNESIUM-VITAMIN D PO Take 1 tablet by mouth daily.  [name] denosumab (PROLIA) 60 MG/ML SOSY injection Prolia 60 mg/mL subcutaneous syringe  . gabapentin (NEURONTIN) 100 MG capsule Take 300 mg by mouth 3 (three) times daily.  [name] losartan-hydrochlorothiazide (HYZAAR) 100-12.5 MG tablet Take 1 tablet by mouth daily.  . meloxicam (MOBIC) 15 MG tablet Take 1 tablet by mouth daily.  . Multiple Vitamins-Minerals (MULTIVITAMIN WITH MINERALS) tablet Take 1 tablet by mouth daily.  . nitrofurantoin (MACRODANTIN) 100 MG capsule Take 100 mg by mouth daily.  . simvastatin (ZOCOR) 40 MG tablet TAKE ONE TABLET AT BEDTIME.  . SYNTHROID 50 MCG tablet TAKE 1 TABLET DAILY BEFORE BREAKFAST FOR HYPOTHYROIDISM  . VITAMIN D PO Take 1,000 mg by mouth daily.  [name]  diphenhydrAMINE HCl (BENADRYL PO) Take 12.5 mg by mouth at bedtime. (Patient not taking: Reported on 12/05/2020)   No facility-administered encounter medications on file as of 12/05/2020.   Allergies  Allergen Reactions  . Naprosyn [Naproxen] Rash   ROS: no fever, chills,  headaches, dizziness, chest pain, shortness of breath. No GI or GU complaints.  +back/buttock pain and R ankle pain per HPI. LE edema bilaterally per HPI. No bleeding, rash.  PHYSICAL EXAM: BP 128/68   Pulse 72   Ht 5\' 4"  (1.626 m)   Wt 138 lb 6.4 oz (62.8 kg)   LMP  (LMP Unknown)   BMI 23.76 kg/m   Well-appearing, pleasant female, in mild discomfort with certain activities/movements, mainly appears comfortable HEENT: conjunctiva and sclera are clear, EOMI, wearing mask Neck: No lymphadenopathy, mass or tenderness Heart: regular rate and rhythm Lungs: clear Back: nontender over spine, CVA. Some tenderness R paraspinous, SI, into R buttock Extremities: very tender laterally at R ankle (not fully examined).  No erythema or asymmetry noted.  She has trace edema at ankles, no visible swelling (nothing pitting) in calves (patient states very swollen).  Nontender over calves, no cords, negative Homan. Psych: appears mainly frustrated with respect to swelling in legs/ankles (not pain). Full range of affect, normal hygiene, grooming, eye contact and speech. Neuro: alert and oriented.   Normal strength, gait.   ASSESSMENT/PLAN:  Peripheral edema - Ddx reviewed with pt.  No e/o significant CHF or DVT. Suspect amlodipine contributing. Encouraged compression, elevation, low Na diet. to d/w cardiologist  Lumbar radiculopathy, right  Peroneal tendinitis of right lower extremity  Essential hypertension, benign - BP is well controlled.  Suspect amlodipine contributing to edema, suggested poss decrease dose and/or increase diuretic.  To d/w cardiology  I spent at least 35 minutes dedicated to the care of this patient, including pre-visit review of records, face to face time, post-visit ordering of testing and documentation.

## 2020-12-05 NOTE — Patient Instructions (Signed)
I do not see any evidence of blood clots or heart failure.  I recommend following up with cardiology if your leg swelling persists.  We discussed various etiologies.  I suspect amlodipine may be contributing, and perhaps adjusting your medications (increasing HCTZ component and/or lowering amlodipine) might be helpful.  Be sure to limit the sodium in your diet (lunch meats were something that could be improved), and use compression socks regularly.

## 2020-12-06 ENCOUNTER — Encounter: Payer: Self-pay | Admitting: Family Medicine

## 2020-12-06 DIAGNOSIS — M5416 Radiculopathy, lumbar region: Secondary | ICD-10-CM | POA: Diagnosis not present

## 2020-12-08 ENCOUNTER — Encounter: Payer: Self-pay | Admitting: Family Medicine

## 2020-12-10 DIAGNOSIS — S93491D Sprain of other ligament of right ankle, subsequent encounter: Secondary | ICD-10-CM | POA: Diagnosis not present

## 2020-12-10 DIAGNOSIS — M5416 Radiculopathy, lumbar region: Secondary | ICD-10-CM | POA: Diagnosis not present

## 2020-12-11 DIAGNOSIS — M79604 Pain in right leg: Secondary | ICD-10-CM | POA: Diagnosis not present

## 2020-12-11 DIAGNOSIS — M6281 Muscle weakness (generalized): Secondary | ICD-10-CM | POA: Diagnosis not present

## 2020-12-11 DIAGNOSIS — M5416 Radiculopathy, lumbar region: Secondary | ICD-10-CM | POA: Diagnosis not present

## 2020-12-11 DIAGNOSIS — M48061 Spinal stenosis, lumbar region without neurogenic claudication: Secondary | ICD-10-CM | POA: Diagnosis not present

## 2020-12-12 ENCOUNTER — Encounter: Payer: Self-pay | Admitting: Family Medicine

## 2020-12-13 ENCOUNTER — Encounter: Payer: Self-pay | Admitting: Podiatry

## 2020-12-13 ENCOUNTER — Other Ambulatory Visit: Payer: Self-pay

## 2020-12-13 ENCOUNTER — Ambulatory Visit (INDEPENDENT_AMBULATORY_CARE_PROVIDER_SITE_OTHER): Payer: Medicare Other | Admitting: Podiatry

## 2020-12-13 DIAGNOSIS — M7751 Other enthesopathy of right foot: Secondary | ICD-10-CM | POA: Diagnosis not present

## 2020-12-13 DIAGNOSIS — G5761 Lesion of plantar nerve, right lower limb: Secondary | ICD-10-CM

## 2020-12-13 DIAGNOSIS — G5781 Other specified mononeuropathies of right lower limb: Secondary | ICD-10-CM

## 2020-12-13 DIAGNOSIS — M5416 Radiculopathy, lumbar region: Secondary | ICD-10-CM | POA: Diagnosis not present

## 2020-12-13 MED ORDER — DEXAMETHASONE SODIUM PHOSPHATE 120 MG/30ML IJ SOLN
2.0000 mg | Freq: Once | INTRAMUSCULAR | Status: AC
  Administered 2020-12-13: 2 mg via INTRA_ARTICULAR

## 2020-12-13 MED ORDER — TRIAMCINOLONE ACETONIDE 40 MG/ML IJ SUSP
20.0000 mg | Freq: Once | INTRAMUSCULAR | Status: AC
  Administered 2020-12-13: 20 mg

## 2020-12-13 NOTE — Progress Notes (Signed)
She presents today for follow-up of her Planter fasciitis and posterior tibial tendinitis of the right foot.  States that I been in physical therapy with the bulging disc and thinks that this may have flared up the lateral aspect of the right foot.  She has significant pain in the back and leg.  She also has pain to the third interdigital space of the right foot.  Objective: Vital signs are stable she is alert oriented x3.  Pulses are palpable.  She has pain on palpation of the sinus tarsi and on end range of motion of the sinus tarsi and subtalar joint.  Palpable Mulder's click third interdigital space right foot.  Assessment: Subtalar joint capsulitis.  Neuroma third interdigital space right foot.  Plan: I injected the area today with Kenalog and local anesthetic.  Should this not alleviate her symptoms then most likely this is from her back or Dr. Herma Mering has been injecting it to try to ease her radiculopathy.  Injected dexamethasone 2 mg third interspace of the right foot.  With local anesthetic.  Tolerated procedure well follow-up with her in the near future.

## 2020-12-17 DIAGNOSIS — M5416 Radiculopathy, lumbar region: Secondary | ICD-10-CM | POA: Diagnosis not present

## 2020-12-19 DIAGNOSIS — M5416 Radiculopathy, lumbar region: Secondary | ICD-10-CM | POA: Diagnosis not present

## 2020-12-19 DIAGNOSIS — M5459 Other low back pain: Secondary | ICD-10-CM | POA: Diagnosis not present

## 2020-12-19 DIAGNOSIS — M79604 Pain in right leg: Secondary | ICD-10-CM | POA: Diagnosis not present

## 2020-12-19 DIAGNOSIS — M6281 Muscle weakness (generalized): Secondary | ICD-10-CM | POA: Diagnosis not present

## 2020-12-19 DIAGNOSIS — M48061 Spinal stenosis, lumbar region without neurogenic claudication: Secondary | ICD-10-CM | POA: Diagnosis not present

## 2020-12-26 ENCOUNTER — Other Ambulatory Visit: Payer: Self-pay | Admitting: Family Medicine

## 2020-12-26 DIAGNOSIS — M79604 Pain in right leg: Secondary | ICD-10-CM | POA: Diagnosis not present

## 2020-12-26 DIAGNOSIS — Z1231 Encounter for screening mammogram for malignant neoplasm of breast: Secondary | ICD-10-CM

## 2020-12-26 DIAGNOSIS — M6281 Muscle weakness (generalized): Secondary | ICD-10-CM | POA: Diagnosis not present

## 2020-12-26 DIAGNOSIS — M48061 Spinal stenosis, lumbar region without neurogenic claudication: Secondary | ICD-10-CM | POA: Diagnosis not present

## 2020-12-26 DIAGNOSIS — M5416 Radiculopathy, lumbar region: Secondary | ICD-10-CM | POA: Diagnosis not present

## 2020-12-31 DIAGNOSIS — L821 Other seborrheic keratosis: Secondary | ICD-10-CM | POA: Diagnosis not present

## 2021-01-02 DIAGNOSIS — M6281 Muscle weakness (generalized): Secondary | ICD-10-CM | POA: Diagnosis not present

## 2021-01-02 DIAGNOSIS — M5416 Radiculopathy, lumbar region: Secondary | ICD-10-CM | POA: Diagnosis not present

## 2021-01-02 DIAGNOSIS — M79604 Pain in right leg: Secondary | ICD-10-CM | POA: Diagnosis not present

## 2021-01-02 DIAGNOSIS — M48061 Spinal stenosis, lumbar region without neurogenic claudication: Secondary | ICD-10-CM | POA: Diagnosis not present

## 2021-01-08 ENCOUNTER — Encounter: Payer: Self-pay | Admitting: Family Medicine

## 2021-01-08 DIAGNOSIS — M79604 Pain in right leg: Secondary | ICD-10-CM | POA: Diagnosis not present

## 2021-01-08 DIAGNOSIS — M6281 Muscle weakness (generalized): Secondary | ICD-10-CM | POA: Diagnosis not present

## 2021-01-08 DIAGNOSIS — M5416 Radiculopathy, lumbar region: Secondary | ICD-10-CM | POA: Diagnosis not present

## 2021-01-08 DIAGNOSIS — M48061 Spinal stenosis, lumbar region without neurogenic claudication: Secondary | ICD-10-CM | POA: Diagnosis not present

## 2021-01-10 DIAGNOSIS — M47816 Spondylosis without myelopathy or radiculopathy, lumbar region: Secondary | ICD-10-CM | POA: Diagnosis not present

## 2021-01-10 DIAGNOSIS — M4316 Spondylolisthesis, lumbar region: Secondary | ICD-10-CM | POA: Diagnosis not present

## 2021-01-10 DIAGNOSIS — R030 Elevated blood-pressure reading, without diagnosis of hypertension: Secondary | ICD-10-CM | POA: Diagnosis not present

## 2021-01-14 ENCOUNTER — Encounter: Payer: Self-pay | Admitting: Family Medicine

## 2021-01-14 DIAGNOSIS — M79604 Pain in right leg: Secondary | ICD-10-CM | POA: Diagnosis not present

## 2021-01-14 DIAGNOSIS — M6281 Muscle weakness (generalized): Secondary | ICD-10-CM | POA: Diagnosis not present

## 2021-01-14 DIAGNOSIS — M5416 Radiculopathy, lumbar region: Secondary | ICD-10-CM | POA: Diagnosis not present

## 2021-01-14 DIAGNOSIS — M48061 Spinal stenosis, lumbar region without neurogenic claudication: Secondary | ICD-10-CM | POA: Diagnosis not present

## 2021-01-16 ENCOUNTER — Encounter: Payer: Self-pay | Admitting: Family Medicine

## 2021-01-16 ENCOUNTER — Other Ambulatory Visit: Payer: Self-pay | Admitting: Neurosurgery

## 2021-01-28 NOTE — Pre-Procedure Instructions (Signed)
Surgical Instructions    Your procedure is scheduled on Thursday, June 23rd.  Report to [facility] Main Entrance "A" at 10:35 A.M., then check in with the Admitting office.  Call this number if you have problems the morning of surgery:  [phone]   If you have any questions prior to your surgery date call [phone]: Open Monday-Friday 8am-4pm    Remember:  Do not eat or drink after midnight the night before your surgery   Take these medicines the morning of surgery with A SIP OF WATER  gabapentin (NEURONTIN) SYNTHROID  nitrofurantoin (MACRODANTIN) acetaminophen (TYLENOL)-as needed  Follow your surgeon's instructions on when to stop Aspirin.  If no instructions were given by your surgeon then you will need to call the office to get those instructions.    As of today, STOP taking any Aspirin (unless otherwise instructed by your surgeon) Aleve, Naproxen, Ibuprofen, Motrin, Advil, Goody's, BC's, all herbal medications, fish oil, and all vitamins.                     Do NOT Smoke (Tobacco/Vaping) or drink Alcohol 24 hours prior to your procedure.  If you use a CPAP at night, you may bring all equipment for your overnight stay.   Contacts, glasses, piercing's, hearing aid's, dentures or partials may not be worn into surgery, please bring cases for these belongings.    For patients admitted to the hospital, discharge time will be determined by your treatment team.   Patients discharged the day of surgery will not be allowed to drive home, and someone needs to stay with them for 24 hours.    Special instructions:   [facility]- Preparing For Surgery  Before surgery, you can play an important role. Because skin is not sterile, your skin needs to be as free of germs as possible. You can reduce the number of germs on your skin by washing with CHG (chlorahexidine gluconate) Soap before surgery.  CHG is an antiseptic cleaner which kills germs and bonds with the skin to continue  killing germs even after washing.    Oral Hygiene is also important to reduce your risk of infection.  Remember - BRUSH YOUR TEETH THE MORNING OF SURGERY WITH YOUR REGULAR TOOTHPASTE  Please do not use if you have an allergy to CHG or antibacterial soaps. If your skin becomes reddened/irritated stop using the CHG.  Do not shave (including legs and underarms) for at least 48 hours prior to first CHG shower. It is OK to shave your face.  Please follow these instructions carefully.   Shower the NIGHT BEFORE SURGERY and the MORNING OF SURGERY  If you chose to wash your hair, wash your hair first as usual with your normal shampoo.  After you shampoo, rinse your hair and body thoroughly to remove the shampoo.  Use CHG Soap as you would any other liquid soap. You can apply CHG directly to the skin and wash gently with a scrungie or a clean washcloth.   Apply the CHG Soap to your body ONLY FROM THE NECK DOWN.  Do not use on open wounds or open sores. Avoid contact with your eyes, ears, mouth and genitals (private parts). Wash Face and genitals (private parts)  with your normal soap.   Wash thoroughly, paying special attention to the area where your surgery will be performed.  Thoroughly rinse your body with warm water from the neck down.  DO NOT shower/wash with your normal soap after using and rinsing  off the CHG Soap.  Pat yourself dry with a CLEAN TOWEL.  Wear CLEAN PAJAMAS to bed the night before surgery  Place CLEAN SHEETS on your bed the night before your surgery  DO NOT SLEEP WITH PETS.   Day of Surgery: Shower with CHG soap. Do not wear jewelry, make up, or nail polish Do not wear lotions, powders, perfumes, or deodorant. Do not shave 48 hours prior to surgery.   Do not bring valuables to the hospital. [facility] is not responsible for any belongings or valuables. Wear Clean/Comfortable clothing the morning of surgery Remember to brush your teeth WITH YOUR REGULAR  TOOTHPASTE.   Please read over the following fact sheets that you were given.

## 2021-01-29 ENCOUNTER — Encounter (HOSPITAL_COMMUNITY): Payer: Self-pay

## 2021-01-29 ENCOUNTER — Other Ambulatory Visit: Payer: Self-pay

## 2021-01-29 ENCOUNTER — Encounter (HOSPITAL_COMMUNITY)
Admission: RE | Admit: 2021-01-29 | Discharge: 2021-01-29 | Disposition: A | Discharge status: Home / Self Care | Payer: Medicare Other | Source: Ambulatory Visit | Attending: Neurosurgery | Admitting: Neurosurgery

## 2021-01-29 DIAGNOSIS — E785 Hyperlipidemia, unspecified: Secondary | ICD-10-CM | POA: Diagnosis not present

## 2021-01-29 DIAGNOSIS — K5909 Other constipation: Secondary | ICD-10-CM | POA: Diagnosis not present

## 2021-01-29 DIAGNOSIS — Z96652 Presence of left artificial knee joint: Secondary | ICD-10-CM | POA: Diagnosis not present

## 2021-01-29 DIAGNOSIS — M48062 Spinal stenosis, lumbar region with neurogenic claudication: Secondary | ICD-10-CM | POA: Diagnosis not present

## 2021-01-29 DIAGNOSIS — I10 Essential (primary) hypertension: Secondary | ICD-10-CM | POA: Diagnosis not present

## 2021-01-29 DIAGNOSIS — Z20822 Contact with and (suspected) exposure to covid-19: Secondary | ICD-10-CM | POA: Insufficient documentation

## 2021-01-29 DIAGNOSIS — Z7982 Long term (current) use of aspirin: Secondary | ICD-10-CM | POA: Diagnosis not present

## 2021-01-29 DIAGNOSIS — D6851 Activated protein C resistance: Secondary | ICD-10-CM | POA: Diagnosis not present

## 2021-01-29 DIAGNOSIS — K219 Gastro-esophageal reflux disease without esophagitis: Secondary | ICD-10-CM | POA: Diagnosis not present

## 2021-01-29 DIAGNOSIS — Z9079 Acquired absence of other genital organ(s): Secondary | ICD-10-CM | POA: Diagnosis not present

## 2021-01-29 DIAGNOSIS — M47816 Spondylosis without myelopathy or radiculopathy, lumbar region: Secondary | ICD-10-CM | POA: Diagnosis not present

## 2021-01-29 DIAGNOSIS — Z90710 Acquired absence of both cervix and uterus: Secondary | ICD-10-CM | POA: Diagnosis not present

## 2021-01-29 DIAGNOSIS — Z86718 Personal history of other venous thrombosis and embolism: Secondary | ICD-10-CM | POA: Insufficient documentation

## 2021-01-29 DIAGNOSIS — Z886 Allergy status to analgesic agent status: Secondary | ICD-10-CM | POA: Diagnosis not present

## 2021-01-29 DIAGNOSIS — Z01812 Encounter for preprocedural laboratory examination: Secondary | ICD-10-CM | POA: Insufficient documentation

## 2021-01-29 DIAGNOSIS — Z90722 Acquired absence of ovaries, bilateral: Secondary | ICD-10-CM | POA: Diagnosis not present

## 2021-01-29 DIAGNOSIS — M810 Age-related osteoporosis without current pathological fracture: Secondary | ICD-10-CM | POA: Diagnosis not present

## 2021-01-29 DIAGNOSIS — Z87891 Personal history of nicotine dependence: Secondary | ICD-10-CM | POA: Insufficient documentation

## 2021-01-29 DIAGNOSIS — E039 Hypothyroidism, unspecified: Secondary | ICD-10-CM | POA: Diagnosis not present

## 2021-01-29 DIAGNOSIS — Z79890 Hormone replacement therapy (postmenopausal): Secondary | ICD-10-CM | POA: Diagnosis not present

## 2021-01-29 DIAGNOSIS — Z79899 Other long term (current) drug therapy: Secondary | ICD-10-CM | POA: Diagnosis not present

## 2021-01-29 HISTORY — DX: Hypothyroidism, unspecified: E03.9

## 2021-01-29 LAB — SURGICAL PCR SCREEN
MRSA, PCR: NEGATIVE
Staphylococcus aureus: NEGATIVE

## 2021-01-29 LAB — BASIC METABOLIC PANEL
Anion gap: 8 (ref 5–15)
BUN: 20 mg/dL (ref 8–23)
CO2: 27 mmol/L (ref 22–32)
Calcium: 9.7 mg/dL (ref 8.9–10.3)
Chloride: 103 mmol/L (ref 98–111)
Creatinine, Ser: 0.78 mg/dL (ref 0.44–1.00)
GFR, Estimated: >60 mL/min (ref >60)
Glucose, Bld: 93 mg/dL (ref 70–99)
Potassium: 4.5 mmol/L (ref 3.5–5.1)
Sodium: 138 mmol/L (ref 135–145)

## 2021-01-29 LAB — TYPE AND SCREEN
ABO/RH(D): A POS
Antibody Screen: NEGATIVE

## 2021-01-29 LAB — CBC
HCT: 38.2 % (ref 36.0–46.0)
Hemoglobin: 12.7 g/dL (ref 12.0–15.0)
MCH: 34.5 pg — ABNORMAL HIGH (ref 26.0–34.0)
MCHC: 33.2 g/dL (ref 30.0–36.0)
MCV: 103.8 fL — ABNORMAL HIGH (ref 80.0–100.0)
Platelets: 228 10*3/uL (ref 150–400)
RBC: 3.68 MIL/uL — ABNORMAL LOW (ref 3.87–5.11)
RDW: 13 % (ref 11.5–15.5)
WBC: 8.6 10*3/uL (ref 4.0–10.5)
nRBC: 0 % (ref 0.0–0.2)

## 2021-01-29 LAB — SARS CORONAVIRUS 2 (TAT 6-24 HRS): SARS Coronavirus 2: NEGATIVE

## 2021-01-29 LAB — GLUCOSE, CAPILLARY: Glucose-Capillary: 89 mg/dL (ref 70–99)

## 2021-01-29 NOTE — Anesthesia Preprocedure Evaluation (Addendum)
Anesthesia Evaluation  Patient identified by MRN, date of birth, ID band Patient awake    Reviewed: Allergy & Precautions, H&P , NPO status , Patient's Chart, lab work & pertinent test results  Airway Mallampati: II   Neck ROM: full    Dental   Pulmonary former smoker,    breath sounds clear to auscultation       Cardiovascular hypertension,  Rhythm:regular Rate:Normal     Neuro/Psych    GI/Hepatic GERD  ,  Endo/Other  Hypothyroidism   Renal/GU      Musculoskeletal  (+) Arthritis ,   Abdominal   Peds  Hematology Factor V Leiden. Pt had DVT in 2014.   Anesthesia Other Findings   Reproductive/Obstetrics                            Anesthesia Physical Anesthesia Plan  ASA: 3  Anesthesia Plan: General   Post-op Pain Management:    Induction: Intravenous  PONV Risk Score and Plan: 3 and Ondansetron, Dexamethasone and Treatment may vary due to age or medical condition  Airway Management Planned: Oral ETT  Additional Equipment:   Intra-op Plan:   Post-operative Plan: Extubation in OR  Informed Consent: I have reviewed the patients History and Physical, chart, labs and discussed the procedure including the risks, benefits and alternatives for the proposed anesthesia with the patient or authorized representative who has indicated his/her understanding and acceptance.     Dental advisory given  Plan Discussed with: CRNA, Anesthesiologist and Surgeon  Anesthesia Plan Comments: (PAT note by Karoline Caldwell, PA-C:  Follows with cardiologist Dr. Einar Gip for history of chronic DOE (20-pack-year smoking history, quit 1986), HTN, HLD, history of DVT and factor V Leiden positive 2014.  She underwent pulmonary evaluation which did not reveal any etiology for her symptoms.  PFTs showed no evidence of COPD.  CT scan negative for PE in 2021.  She did have a normal nuclear stress test 01/25/2020.  She was  last seen by Lawerance Cruel, PA-C 10/03/2020 and noted to be doing well from cardiac standpoint, exercising on a daily basis without issue, DOE noted to be improving somewhat.  She was advised to follow-up in 1 year.  Preop labs reviewed, unremarkable.  EKG 10/02/2020: Sinus rhythm at a rate of 70 bpm. Normal axis. No evidence of ischemia or underlying injury pattern. Compared to EKG 09/14/2019, no ST depressions noted.  Stress test 01/25/2020 Exercise nuclear stress test was performed using Bruce protocol. Patient reached 7 METS, and 90% of age predicted maximum heart rate. Exercise capacity was low. Chest pain not reported. Dyspnea , fatigue reported. Heart rate and hemodynamic response were normal. Interpretation of stress EKG limited due to significant baseline artifact. Normal myocardial perfusion. Stress LVEF 82%. Low risk study.  Echo 65/46/5035 Normal LV systolic function with visual EF 60-65%. Left ventricle cavity is normal in size. Mild left ventricular hypertrophy. Normal global wall motion. Normal diastolic filling pattern, normal LAP. Mild (Grade I) mitral regurgitation. Mild tricuspid regurgitation. No evidence of pulmonary hypertension. Compared to prior study dated 04/09/2016 08/19/2019, no significant changes noted.  )       Anesthesia Quick Evaluation

## 2021-01-29 NOTE — Progress Notes (Signed)
Anesthesia Chart Review:  Follows with cardiologist Dr. Einar Gip for history of chronic DOE (20-pack-year smoking history, quit 1986), HTN, HLD, history of DVT and factor V Leiden positive 2014.  She underwent pulmonary evaluation which did not reveal any etiology for her symptoms.  PFTs showed no evidence of COPD.  CT scan negative for PE in 2021.  She did have a normal nuclear stress test 01/25/2020.  She was last seen by Lawerance Cruel, PA-C 10/03/2020 and noted to be doing well from cardiac standpoint, exercising on a daily basis without issue, DOE noted to be improving somewhat.  She was advised to follow-up in 1 year.  Preop labs reviewed, unremarkable.  EKG 10/02/2020: Sinus rhythm at a rate of 70 bpm.  Normal axis.  No evidence of ischemia or underlying injury pattern. Compared to EKG 09/14/2019, no ST depressions noted.   Stress test 01/25/2020 Exercise nuclear stress test was performed using Bruce protocol. Patient reached 7 METS, and 90% of age predicted maximum heart rate. Exercise capacity was low. Chest pain not reported. Dyspnea , fatigue reported. Heart rate and hemodynamic response were normal. Interpretation of stress EKG limited due to significant baseline artifact. Normal myocardial perfusion. Stress LVEF 82%. Low risk study.   Echo 16/96/7893 Normal LV systolic function with visual EF 60-65%. Left ventricle cavity is normal in size. Mild left ventricular hypertrophy. Normal global wall motion. Normal diastolic filling pattern, normal LAP. Mild (Grade I) mitral regurgitation. Mild tricuspid regurgitation. No evidence of pulmonary hypertension. Compared to prior study dated 04/09/2016 08/19/2019, no significant changes noted.   Wynonia Musty [facility] Short Stay Center/Anesthesiology Phone [phone] 01/29/2021 12:09 PM

## 2021-01-29 NOTE — Progress Notes (Signed)
PCP - Dr. [name] Cardiologist - Dr. Einar Gip  Chest x-ray - n/a EKG - 10/03/20 Stress Test - 01/26/20 ECHO - 06/20/20 Cardiac Cath - denies  Sleep Study - denies CPAP - denies  CBG in PAT 89 Pt is pre-diabetic, last A1C 5.8 in 12/21. Will collect A1C today. Pt does not check CBG at home.   Blood Thinner Instructions: n/a Aspirin Instructions:LD 01/24/20  COVID TEST- 01/29/21 in PAT  Anesthesia review: Yes, cardiac hx. Last visit with cards 09/2020,  no current issues-pt to f/u in a year.   Patient denies shortness of breath, fever, cough and chest pain at PAT appointment   All instructions explained to the patient, with a verbal understanding of the material. Patient agrees to go over the instructions while at home for a better understanding. Patient also instructed to self quarantine after being tested for COVID-19. The opportunity to ask questions was provided.

## 2021-01-30 LAB — HEMOGLOBIN A1C
Hgb A1c MFr Bld: 5.8 % — ABNORMAL HIGH (ref 4.8–5.6)
Mean Plasma Glucose: 120 mg/dL

## 2021-01-31 ENCOUNTER — Other Ambulatory Visit: Payer: Self-pay

## 2021-01-31 ENCOUNTER — Inpatient Hospital Stay (HOSPITAL_COMMUNITY)
Admission: RE | Admit: 2021-01-31 | Discharge: 2021-02-01 | DRG: 460 | Disposition: A | Discharge status: Home / Self Care | Payer: Medicare Other | Attending: Neurosurgery | Admitting: Neurosurgery

## 2021-01-31 ENCOUNTER — Encounter: Payer: Self-pay | Admitting: Podiatry

## 2021-01-31 ENCOUNTER — Inpatient Hospital Stay (HOSPITAL_COMMUNITY): Payer: Medicare Other

## 2021-01-31 ENCOUNTER — Inpatient Hospital Stay (HOSPITAL_COMMUNITY): Payer: Medicare Other | Admitting: Certified Registered Nurse Anesthetist

## 2021-01-31 ENCOUNTER — Inpatient Hospital Stay (HOSPITAL_COMMUNITY): Payer: Medicare Other | Admitting: Vascular Surgery

## 2021-01-31 ENCOUNTER — Encounter (HOSPITAL_COMMUNITY): Payer: Self-pay | Admitting: Neurosurgery

## 2021-01-31 ENCOUNTER — Encounter (HOSPITAL_COMMUNITY)
Admission: RE | Disposition: A | Discharge status: Home / Self Care | Payer: Self-pay | Source: Home / Self Care | Attending: Neurosurgery

## 2021-01-31 DIAGNOSIS — M810 Age-related osteoporosis without current pathological fracture: Secondary | ICD-10-CM | POA: Diagnosis present

## 2021-01-31 DIAGNOSIS — D6851 Activated protein C resistance: Secondary | ICD-10-CM | POA: Diagnosis present

## 2021-01-31 DIAGNOSIS — K219 Gastro-esophageal reflux disease without esophagitis: Secondary | ICD-10-CM | POA: Diagnosis not present

## 2021-01-31 DIAGNOSIS — Z7982 Long term (current) use of aspirin: Secondary | ICD-10-CM

## 2021-01-31 DIAGNOSIS — Z20822 Contact with and (suspected) exposure to covid-19: Secondary | ICD-10-CM | POA: Diagnosis present

## 2021-01-31 DIAGNOSIS — Z90710 Acquired absence of both cervix and uterus: Secondary | ICD-10-CM

## 2021-01-31 DIAGNOSIS — E7800 Pure hypercholesterolemia, unspecified: Secondary | ICD-10-CM

## 2021-01-31 DIAGNOSIS — Z90722 Acquired absence of ovaries, bilateral: Secondary | ICD-10-CM

## 2021-01-31 DIAGNOSIS — Z981 Arthrodesis status: Secondary | ICD-10-CM | POA: Diagnosis not present

## 2021-01-31 DIAGNOSIS — M4316 Spondylolisthesis, lumbar region: Secondary | ICD-10-CM | POA: Diagnosis present

## 2021-01-31 DIAGNOSIS — E039 Hypothyroidism, unspecified: Secondary | ICD-10-CM | POA: Diagnosis not present

## 2021-01-31 DIAGNOSIS — K5909 Other constipation: Secondary | ICD-10-CM | POA: Diagnosis present

## 2021-01-31 DIAGNOSIS — Z886 Allergy status to analgesic agent status: Secondary | ICD-10-CM | POA: Diagnosis not present

## 2021-01-31 DIAGNOSIS — Z9079 Acquired absence of other genital organ(s): Secondary | ICD-10-CM

## 2021-01-31 DIAGNOSIS — Z96652 Presence of left artificial knee joint: Secondary | ICD-10-CM | POA: Diagnosis present

## 2021-01-31 DIAGNOSIS — Z79899 Other long term (current) drug therapy: Secondary | ICD-10-CM

## 2021-01-31 DIAGNOSIS — M48062 Spinal stenosis, lumbar region with neurogenic claudication: Principal | ICD-10-CM | POA: Diagnosis present

## 2021-01-31 DIAGNOSIS — Z86718 Personal history of other venous thrombosis and embolism: Secondary | ICD-10-CM | POA: Diagnosis not present

## 2021-01-31 DIAGNOSIS — M47816 Spondylosis without myelopathy or radiculopathy, lumbar region: Secondary | ICD-10-CM | POA: Diagnosis not present

## 2021-01-31 DIAGNOSIS — Z419 Encounter for procedure for purposes other than remedying health state, unspecified: Secondary | ICD-10-CM

## 2021-01-31 DIAGNOSIS — Z79890 Hormone replacement therapy (postmenopausal): Secondary | ICD-10-CM

## 2021-01-31 DIAGNOSIS — E785 Hyperlipidemia, unspecified: Secondary | ICD-10-CM | POA: Diagnosis present

## 2021-01-31 DIAGNOSIS — M4726 Other spondylosis with radiculopathy, lumbar region: Secondary | ICD-10-CM | POA: Diagnosis not present

## 2021-01-31 DIAGNOSIS — I10 Essential (primary) hypertension: Secondary | ICD-10-CM | POA: Diagnosis present

## 2021-01-31 HISTORY — PX: POSTERIOR LUMBAR FUSION: SHX6036

## 2021-01-31 LAB — GLUCOSE, CAPILLARY
Glucose-Capillary: 99 mg/dL (ref 70–99)
Glucose-Capillary: 90 mg/dL (ref 70–99)

## 2021-01-31 SURGERY — POSTERIOR LUMBAR FUSION 1 LEVEL
Anesthesia: General

## 2021-01-31 MED ORDER — DEXAMETHASONE SODIUM PHOSPHATE 10 MG/ML IJ SOLN
INTRAMUSCULAR | Status: AC
  Filled 2021-01-31: qty 1

## 2021-01-31 MED ORDER — LIDOCAINE-EPINEPHRINE 1 %-1:100000 IJ SOLN
INTRAMUSCULAR | Status: DC | PRN
  Administered 2021-01-31: 36 mL

## 2021-01-31 MED ORDER — OXYCODONE HCL 5 MG PO TABS
5.0000 mg | ORAL_TABLET | ORAL | Status: DC | PRN
  Administered 2021-02-01: 5 mg via ORAL
  Filled 2021-01-31: qty 1

## 2021-01-31 MED ORDER — PROPOFOL 10 MG/ML IV BOLUS
INTRAVENOUS | Status: AC
  Filled 2021-01-31: qty 20

## 2021-01-31 MED ORDER — OXYCODONE HCL 5 MG/5ML PO SOLN
5.0000 mg | Freq: Once | ORAL | Status: DC | PRN
Start: 2021-01-31 — End: 2021-01-31

## 2021-01-31 MED ORDER — OXYCODONE HCL 5 MG PO TABS: 10.0000 mg | ORAL_TABLET | ORAL | Status: DC | PRN

## 2021-01-31 MED ORDER — ONDANSETRON HCL 4 MG/2ML IJ SOLN
INTRAMUSCULAR | Status: DC | PRN
  Administered 2021-01-31: 4 mg via INTRAVENOUS

## 2021-01-31 MED ORDER — DIAZEPAM 5 MG PO TABS
5.0000 mg | ORAL_TABLET | Freq: Four times a day (QID) | ORAL | Status: DC | PRN
  Administered 2021-02-01: 5 mg via ORAL
  Filled 2021-01-31: qty 1

## 2021-01-31 MED ORDER — ONDANSETRON HCL 4 MG PO TABS: 4.0000 mg | ORAL_TABLET | Freq: Four times a day (QID) | ORAL | Status: DC | PRN

## 2021-01-31 MED ORDER — THROMBIN 20000 UNITS EX SOLR
CUTANEOUS | Status: AC
  Filled 2021-01-31: qty 20000

## 2021-01-31 MED ORDER — HYDROCHLOROTHIAZIDE 12.5 MG PO CAPS: 12.5000 mg | ORAL_CAPSULE | Freq: Every day | ORAL | Status: DC

## 2021-01-31 MED ORDER — ALBUMIN HUMAN 5 % IV SOLN: INTRAVENOUS | Status: DC | PRN

## 2021-01-31 MED ORDER — FENTANYL CITRATE (PF) 250 MCG/5ML IJ SOLN
INTRAMUSCULAR | Status: DC | PRN
  Administered 2021-01-31 (×5): 50 ug via INTRAVENOUS

## 2021-01-31 MED ORDER — FENTANYL CITRATE (PF) 250 MCG/5ML IJ SOLN
INTRAMUSCULAR | Status: AC
  Filled 2021-01-31: qty 5

## 2021-01-31 MED ORDER — PROPOFOL 10 MG/ML IV BOLUS
INTRAVENOUS | Status: DC | PRN
  Administered 2021-01-31: 130 mg via INTRAVENOUS

## 2021-01-31 MED ORDER — ACETAMINOPHEN 500 MG PO TABS
1000.0000 mg | ORAL_TABLET | Freq: Four times a day (QID) | ORAL | Status: AC
  Administered 2021-01-31 – 2021-02-01 (×4): 1000 mg via ORAL
  Filled 2021-01-31 (×4): qty 2

## 2021-01-31 MED ORDER — SUGAMMADEX SODIUM 200 MG/2ML IV SOLN
INTRAVENOUS | Status: DC | PRN
  Administered 2021-01-31: 200 mg via INTRAVENOUS

## 2021-01-31 MED ORDER — LIDOCAINE 2% (20 MG/ML) 5 ML SYRINGE
INTRAMUSCULAR | Status: AC
  Filled 2021-01-31: qty 5

## 2021-01-31 MED ORDER — LOSARTAN POTASSIUM 50 MG PO TABS: 100.0000 mg | ORAL_TABLET | Freq: Every day | ORAL | Status: DC

## 2021-01-31 MED ORDER — SENNA 8.6 MG PO TABS
1.0000 | ORAL_TABLET | Freq: Two times a day (BID) | ORAL | Status: DC
  Administered 2021-01-31 – 2021-02-01 (×2): 8.6 mg via ORAL
  Filled 2021-01-31 (×2): qty 1

## 2021-01-31 MED ORDER — FENTANYL CITRATE (PF) 100 MCG/2ML IJ SOLN: 25.0000 ug | INTRAMUSCULAR | Status: DC | PRN

## 2021-01-31 MED ORDER — ONDANSETRON HCL 4 MG/2ML IJ SOLN: 4.0000 mg | Freq: Four times a day (QID) | INTRAMUSCULAR | Status: DC | PRN

## 2021-01-31 MED ORDER — LIDOCAINE-EPINEPHRINE 1 %-1:100000 IJ SOLN
INTRAMUSCULAR | Status: AC
  Filled 2021-01-31: qty 1

## 2021-01-31 MED ORDER — THROMBIN 20000 UNITS EX SOLR: CUTANEOUS | Status: DC | PRN

## 2021-01-31 MED ORDER — LIDOCAINE 2% (20 MG/ML) 5 ML SYRINGE
INTRAMUSCULAR | Status: DC | PRN
  Administered 2021-01-31: 60 mg via INTRAVENOUS

## 2021-01-31 MED ORDER — DEXAMETHASONE SODIUM PHOSPHATE 10 MG/ML IJ SOLN
INTRAMUSCULAR | Status: DC | PRN
  Administered 2021-01-31: 10 mg via INTRAVENOUS

## 2021-01-31 MED ORDER — MIDAZOLAM HCL 2 MG/2ML IJ SOLN
INTRAMUSCULAR | Status: DC | PRN
  Administered 2021-01-31: 2 mg via INTRAVENOUS

## 2021-01-31 MED ORDER — ROCURONIUM BROMIDE 10 MG/ML (PF) SYRINGE
PREFILLED_SYRINGE | INTRAVENOUS | Status: AC
  Filled 2021-01-31: qty 10

## 2021-01-31 MED ORDER — ASPIRIN EC 81 MG PO TBEC
81.0000 mg | DELAYED_RELEASE_TABLET | Freq: Every day | ORAL | Status: DC
  Administered 2021-02-01: 81 mg via ORAL
  Filled 2021-01-31: qty 1

## 2021-01-31 MED ORDER — SIMVASTATIN 20 MG PO TABS
40.0000 mg | ORAL_TABLET | Freq: Every evening | ORAL | Status: DC
  Administered 2021-01-31: 40 mg via ORAL
  Filled 2021-01-31: qty 2

## 2021-01-31 MED ORDER — SODIUM CHLORIDE 0.9% FLUSH: 3.0000 mL | Freq: Two times a day (BID) | INTRAVENOUS | Status: DC

## 2021-01-31 MED ORDER — NITROFURANTOIN MACROCRYSTAL 50 MG PO CAPS
100.0000 mg | ORAL_CAPSULE | Freq: Every day | ORAL | Status: DC
  Filled 2021-01-31: qty 1

## 2021-01-31 MED ORDER — ACETAMINOPHEN 650 MG RE SUPP: 650.0000 mg | RECTAL | Status: DC | PRN

## 2021-01-31 MED ORDER — SODIUM CHLORIDE 0.9% FLUSH: 3.0000 mL | INTRAVENOUS | Status: DC | PRN

## 2021-01-31 MED ORDER — ACETAMINOPHEN 325 MG PO TABS: 650.0000 mg | ORAL_TABLET | ORAL | Status: DC | PRN

## 2021-01-31 MED ORDER — POTASSIUM CHLORIDE IN NACL 20-0.9 MEQ/L-% IV SOLN: INTRAVENOUS | Status: DC

## 2021-01-31 MED ORDER — BUPIVACAINE HCL (PF) 0.5 % IJ SOLN
INTRAMUSCULAR | Status: AC
  Filled 2021-01-31: qty 30

## 2021-01-31 MED ORDER — OXYCODONE HCL ER 10 MG PO T12A
10.0000 mg | EXTENDED_RELEASE_TABLET | Freq: Two times a day (BID) | ORAL | Status: DC
  Administered 2021-01-31: 10 mg via ORAL
  Filled 2021-01-31 (×2): qty 1

## 2021-01-31 MED ORDER — ALUM & MAG HYDROXIDE-SIMETH 200-200-20 MG/5ML PO SUSP: 30.0000 mL | Freq: Four times a day (QID) | ORAL | Status: DC | PRN

## 2021-01-31 MED ORDER — PHENOL 1.4 % MT LIQD
1.0000 | OROMUCOSAL | Status: DC | PRN
Start: 2021-01-31 — End: 2021-02-02

## 2021-01-31 MED ORDER — LOSARTAN POTASSIUM-HCTZ 100-12.5 MG PO TABS: 1.0000 | ORAL_TABLET | Freq: Every day | ORAL | Status: DC

## 2021-01-31 MED ORDER — LACTATED RINGERS IV SOLN: INTRAVENOUS | Status: DC

## 2021-01-31 MED ORDER — MAGNESIUM CITRATE PO SOLN: 1.0000 | Freq: Once | ORAL | Status: DC | PRN

## 2021-01-31 MED ORDER — MORPHINE SULFATE (PF) 2 MG/ML IV SOLN: 2.0000 mg | INTRAVENOUS | Status: DC | PRN

## 2021-01-31 MED ORDER — LEVOTHYROXINE SODIUM 25 MCG PO TABS
50.0000 ug | ORAL_TABLET | Freq: Every day | ORAL | Status: DC
  Administered 2021-02-01: 50 ug via ORAL
  Filled 2021-01-31: qty 2

## 2021-01-31 MED ORDER — GABAPENTIN 300 MG PO CAPS
300.0000 mg | ORAL_CAPSULE | Freq: Three times a day (TID) | ORAL | Status: DC
  Administered 2021-01-31 – 2021-02-01 (×3): 300 mg via ORAL
  Filled 2021-01-31 (×3): qty 1

## 2021-01-31 MED ORDER — CHLORHEXIDINE GLUCONATE 0.12 % MT SOLN
15.0000 mL | Freq: Once | OROMUCOSAL | Status: AC
  Administered 2021-01-31: 15 mL via OROMUCOSAL
  Filled 2021-01-31: qty 15

## 2021-01-31 MED ORDER — PHENYLEPHRINE HCL-NACL 10-0.9 MG/250ML-% IV SOLN
INTRAVENOUS | Status: DC | PRN
  Administered 2021-01-31: 25 ug/min via INTRAVENOUS

## 2021-01-31 MED ORDER — CEFAZOLIN SODIUM-DEXTROSE 2-3 GM-%(50ML) IV SOLR
INTRAVENOUS | Status: DC | PRN
  Administered 2021-01-31: 2 g via INTRAVENOUS

## 2021-01-31 MED ORDER — OXYCODONE HCL 5 MG PO TABS: 5.0000 mg | ORAL_TABLET | Freq: Once | ORAL | Status: DC | PRN

## 2021-01-31 MED ORDER — SENNOSIDES-DOCUSATE SODIUM 8.6-50 MG PO TABS: 1.0000 | ORAL_TABLET | Freq: Every evening | ORAL | Status: DC | PRN

## 2021-01-31 MED ORDER — ZOLPIDEM TARTRATE 5 MG PO TABS: 5.0000 mg | ORAL_TABLET | Freq: Every evening | ORAL | Status: DC | PRN

## 2021-01-31 MED ORDER — ASCORBIC ACID 500 MG PO TABS
500.0000 mg | ORAL_TABLET | Freq: Every day | ORAL | Status: DC
  Administered 2021-02-01: 500 mg via ORAL
  Filled 2021-01-31: qty 1

## 2021-01-31 MED ORDER — SODIUM CHLORIDE 0.9 % IV SOLN: 250.0000 mL | INTRAVENOUS | Status: DC

## 2021-01-31 MED ORDER — ADULT MULTIVITAMIN W/MINERALS CH
1.0000 | ORAL_TABLET | Freq: Every day | ORAL | Status: DC
  Administered 2021-02-01: 1 via ORAL
  Filled 2021-01-31: qty 1

## 2021-01-31 MED ORDER — MIDAZOLAM HCL 2 MG/2ML IJ SOLN
INTRAMUSCULAR | Status: AC
  Filled 2021-01-31: qty 2

## 2021-01-31 MED ORDER — ORAL CARE MOUTH RINSE: 15.0000 mL | Freq: Once | OROMUCOSAL | Status: AC

## 2021-01-31 MED ORDER — BISACODYL 5 MG PO TBEC: 5.0000 mg | DELAYED_RELEASE_TABLET | Freq: Every day | ORAL | Status: DC | PRN

## 2021-01-31 MED ORDER — VITAMIN D 25 MCG (1000 UNIT) PO TABS
2000.0000 [IU] | ORAL_TABLET | Freq: Every day | ORAL | Status: DC
  Administered 2021-02-01: 2000 [IU] via ORAL
  Filled 2021-01-31: qty 2

## 2021-01-31 MED ORDER — 0.9 % SODIUM CHLORIDE (POUR BTL) OPTIME
TOPICAL | Status: DC | PRN
  Administered 2021-01-31: 1000 mL

## 2021-01-31 MED ORDER — ROCURONIUM BROMIDE 10 MG/ML (PF) SYRINGE
PREFILLED_SYRINGE | INTRAVENOUS | Status: DC | PRN
  Administered 2021-01-31: 30 mg via INTRAVENOUS
  Administered 2021-01-31: 60 mg via INTRAVENOUS
  Administered 2021-01-31: 30 mg via INTRAVENOUS

## 2021-01-31 MED ORDER — ONDANSETRON HCL 4 MG/2ML IJ SOLN
INTRAMUSCULAR | Status: AC
  Filled 2021-01-31: qty 2

## 2021-01-31 MED ORDER — CEFAZOLIN SODIUM-DEXTROSE 2-4 GM/100ML-% IV SOLN
INTRAVENOUS | Status: AC
  Filled 2021-01-31: qty 100

## 2021-01-31 MED ORDER — MENTHOL 3 MG MT LOZG
1.0000 | LOZENGE | OROMUCOSAL | Status: DC | PRN
  Filled 2021-01-31: qty 9

## 2021-01-31 MED ORDER — AMLODIPINE BESYLATE 5 MG PO TABS: 2.5000 mg | ORAL_TABLET | Freq: Every evening | ORAL | Status: DC

## 2021-01-31 MED ORDER — LIDOCAINE HCL 1 % IJ SOLN
INTRAMUSCULAR | Status: AC
  Filled 2021-01-31: qty 20

## 2021-01-31 SURGICAL SUPPLY — 65 items
ADH SKN CLS APL DERMABOND .7 (GAUZE/BANDAGES/DRESSINGS) ×1
APL SKNCLS STERI-STRIP NONHPOA (GAUZE/BANDAGES/DRESSINGS)
BASKET BONE COLLECTION (BASKET) ×1 IMPLANT
BENZOIN TINCTURE PRP APPL 2/3 (GAUZE/BANDAGES/DRESSINGS) IMPLANT
BIT DRILL PLIF MAS DISP 5.5MM (DRILL) IMPLANT
BLADE CLIPPER SURG (BLADE) IMPLANT
BUR MATCHSTICK NEURO 3.0 LAGG (BURR) ×2 IMPLANT
BUR PRECISION FLUTE 5.0 (BURR) ×2 IMPLANT
CAGE CONVEX CASCADIA 8.5X22X10 (Cage) ×2 IMPLANT
CANISTER SUCT 3000ML PPV (MISCELLANEOUS) ×2 IMPLANT
CAP RELINE MOD TULIP RMM (Cap) ×4 IMPLANT
CARTRIDGE OIL MAESTRO DRILL (MISCELLANEOUS) ×1 IMPLANT
CNTNR URN SCR LID CUP LEK RST (MISCELLANEOUS) ×1 IMPLANT
CONT SPEC 4OZ STRL OR WHT (MISCELLANEOUS) ×2
COVER BACK TABLE 60X90IN (DRAPES) ×2 IMPLANT
COVER WAND RF STERILE (DRAPES) ×2 IMPLANT
DECANTER SPIKE VIAL GLASS SM (MISCELLANEOUS) ×2 IMPLANT
DERMABOND ADVANCED (GAUZE/BANDAGES/DRESSINGS) ×1
DERMABOND ADVANCED .7 DNX12 (GAUZE/BANDAGES/DRESSINGS) ×1 IMPLANT
DIFFUSER DRILL AIR PNEUMATIC (MISCELLANEOUS) ×2 IMPLANT
DRAPE C-ARM 42X72 X-RAY (DRAPES) ×4 IMPLANT
DRAPE C-ARMOR (DRAPES) IMPLANT
DRAPE LAPAROTOMY 100X72X124 (DRAPES) ×2 IMPLANT
DRAPE SURG 17X23 STRL (DRAPES) ×2 IMPLANT
DRILL PLIF MAS DISP 5.5MM (DRILL) ×2
DRSG OPSITE POSTOP 4X8 (GAUZE/BANDAGES/DRESSINGS) ×1 IMPLANT
DURAPREP 26ML APPLICATOR (WOUND CARE) ×2 IMPLANT
ELECT REM PT RETURN 9FT ADLT (ELECTROSURGICAL) ×2
ELECTRODE REM PT RTRN 9FT ADLT (ELECTROSURGICAL) ×1 IMPLANT
GAUZE 4X4 16PLY RFD (DISPOSABLE) IMPLANT
GAUZE SPONGE 4X4 12PLY STRL (GAUZE/BANDAGES/DRESSINGS) IMPLANT
GLOVE EXAM NITRILE XL STR (GLOVE) IMPLANT
GLOVE SURG LTX SZ6.5 (GLOVE) ×4 IMPLANT
GOWN STRL REUS W/ TWL LRG LVL3 (GOWN DISPOSABLE) ×2 IMPLANT
GOWN STRL REUS W/ TWL XL LVL3 (GOWN DISPOSABLE) IMPLANT
GOWN STRL REUS W/TWL 2XL LVL3 (GOWN DISPOSABLE) IMPLANT
GOWN STRL REUS W/TWL LRG LVL3 (GOWN DISPOSABLE) ×4
GOWN STRL REUS W/TWL XL LVL3 (GOWN DISPOSABLE)
KIT BASIN OR (CUSTOM PROCEDURE TRAY) ×2 IMPLANT
KIT POSITION SURG JACKSON T1 (MISCELLANEOUS) ×2 IMPLANT
KIT TURNOVER KIT B (KITS) ×2 IMPLANT
MILL MEDIUM DISP (BLADE) IMPLANT
NDL HYPO 25X1 1.5 SAFETY (NEEDLE) ×1 IMPLANT
NDL SPNL 18GX3.5 QUINCKE PK (NEEDLE) IMPLANT
NEEDLE HYPO 25X1 1.5 SAFETY (NEEDLE) ×2 IMPLANT
NEEDLE SPNL 18GX3.5 QUINCKE PK (NEEDLE) IMPLANT
NS IRRIG 1000ML POUR BTL (IV SOLUTION) ×2 IMPLANT
OIL CARTRIDGE MAESTRO DRILL (MISCELLANEOUS) ×2
PACK LAMINECTOMY NEURO (CUSTOM PROCEDURE TRAY) ×2 IMPLANT
PAD ARMBOARD 7.5X6 YLW CONV (MISCELLANEOUS) ×4 IMPLANT
ROD RELINE COCR LORD 5X30 (Rod) ×2 IMPLANT
SCREW LOCK RSS 4.5/5.0MM (Screw) ×4 IMPLANT
SCREW SHANK RELINE MOD 6.5X35 (Screw) ×4 IMPLANT
SPONGE LAP 4X18 RFD (DISPOSABLE) IMPLANT
SPONGE SURGIFOAM ABS GEL 100 (HEMOSTASIS) ×2 IMPLANT
STRIP CLOSURE SKIN 1/2X4 (GAUZE/BANDAGES/DRESSINGS) IMPLANT
SUT PROLENE 6 0 BV (SUTURE) IMPLANT
SUT VIC AB 0 CT1 18XCR BRD8 (SUTURE) ×1 IMPLANT
SUT VIC AB 0 CT1 8-18 (SUTURE) ×2
SUT VIC AB 2-0 CT1 18 (SUTURE) ×2 IMPLANT
SUT VIC AB 3-0 SH 8-18 (SUTURE) ×2 IMPLANT
TOWEL GREEN STERILE (TOWEL DISPOSABLE) ×2 IMPLANT
TOWEL GREEN STERILE FF (TOWEL DISPOSABLE) ×2 IMPLANT
TRAY FOLEY MTR SLVR 16FR STAT (SET/KITS/TRAYS/PACK) ×2 IMPLANT
WATER STERILE IRR 1000ML POUR (IV SOLUTION) ×2 IMPLANT

## 2021-01-31 NOTE — Transfer of Care (Signed)
Immediate Anesthesia Transfer of Care Note  Patient: [name]  Procedure(s) Performed: Lumbar 4-5 Posterior lumbar interbody fusion  Patient Location: PACU  Anesthesia Type:General  Level of Consciousness: awake, alert  and oriented  Airway & Oxygen Therapy: Patient Spontanous Breathing and Patient connected to face mask oxygen  Post-op Assessment: Report given to RN and Post -op Vital signs reviewed and stable  Post vital signs: Reviewed and stable  Last Vitals:  Vitals Value Taken Time  BP 102/48 01/31/21 1829  Temp    Pulse 92 01/31/21 1835  Resp 14 01/31/21 1835  SpO2 96 % 01/31/21 1835  Vitals shown include unvalidated device data.  Last Pain:  Vitals:   01/31/21 1050  TempSrc:   PainSc: 4       Patients Stated Pain Goal: 3 (72/62/03 5597)  Complications: No notable events documented.

## 2021-01-31 NOTE — H&P (Signed)
BP (!) 165/60   Pulse 79   Temp 97.8 F (36.6 C) (Oral)   Resp 18   Ht 5\' 4"  (1.626 m)   Wt 61.7 kg   LMP  (LMP Unknown)   SpO2 98%   BMI 23.35 kg/m  Dorothy Rojas is a 75 year old woman who presents today for evaluation of pain she has in her back and right lower extremity since approximately November 2021. She has had physical therapy, she has had injections, she has had time; all without relief.  The pain is such that it has changed the things that she does.  It comes on not necessarily random, but at inopportune times. Allergies  Allergen Reactions   Naprosyn [Naproxen] Rash   Past Medical History:  Diagnosis Date   Allergy    Anemia    Arthritis    knees   Cataract    removed both eyes    Chronic constipation    Clotting disorder ([place])    DVT 2014   Colon polyp 06/2015   tubular adenoma (sigmoid)   Diverticulosis    seen on colonoscopy 06/2015   GERD (gastroesophageal reflux disease)    normal EGD 06/2015   Heart murmur    as a child    Heterozygous factor V Leiden mutation [facility]) dx 07/2013     hematologist--  dr Bernadene Bell ([facility])--  per note "Low Risk"   History of DVT of lower extremity    dx 07-28-2013  left lower extrem.  --  xarelto for 6 months   Hyperlipidemia    Hypertension    Hypothyroidism    Low back pain 06/2020   MRI per ortho--mild-mod L4-5 spinal stenosis, mod-severe R foraminal stenosis, medial diasplacement of R L5. Mod-severe facet arthrosis L4-5, L5-S1   Lymphadenopathy, inguinal    bilateral   Microscopic hematuria    chronic    Multinodular goiter    Multinodular thyroid    a. Path benign 2013.   OAB (overactive bladder)    Osteoporosis 2017   osteopenia since 2011; osteoporosis 12/2015   Pre-diabetes    no meds, managaed with lifestyle changes    Prediabetes    SUI (stress urinary incontinence, female)    Tricuspid regurgitation    mild-mod, noted on echo 03/2016   Past Surgical History:  Procedure Laterality  Date   APPENDECTOMY  1973   CATARACT EXTRACTION W/ INTRAOCULAR LENS  IMPLANT, BILATERAL Bilateral 2015   COLONOSCOPY     CYSTOCELE REPAIR  12-03-2010   CYSTOSCOPY MACROPLASTIQUE IMPLANT N/A 12/12/2014   Procedure: CYSTOSCOPY MACROPLASTIQUE INJECTION;  Surgeon: Bjorn Loser, MD;  Location: [facility];  Service: Urology;  Laterality: N/A;   CYSTOSCOPY MACROPLASTIQUE IMPLANT N/A 04/12/2015   Procedure: CYSTOSCOPY MACROPLASTIQUE IMPLANT;  Surgeon: Bjorn Loser, MD;  Location: [facility];  Service: Urology;  Laterality: N/A;   INCONTINENCE SURGERY  04/1999   Re-do at [facility] for sling complications in 5361   KNEE ARTHROSCOPY Right 04-05-2013   LATERAL EPICONDYLE RELEASE Right    NEUROMA SURGERY Bilateral 1980's    feet   OVARIAN CYST REMOVAL Left 1973   POLYPECTOMY     PUBOVAGINAL SLING N/A 07/30/2015   Procedure: PELVIC EXAM UNDER ANESTHESIA, TRANSVAGINAL BIOPSY OF PERIURETHRAL MASS;  Surgeon: Raynelle Bring, MD;  Location: [facility];  Service: Urology;  Laterality: N/A;  45 MINS    TONSILLECTOMY  as child   TOTAL ABDOMINAL HYSTERECTOMY W/ BILATERAL SALPINGOOPHORECTOMY  09/  2000   TOTAL KNEE  ARTHROPLASTY Left 09/21/2017   Procedure: LEFT TOTAL KNEE ARTHROPLASTY;  Surgeon: Paralee Cancel, MD;  Location: [facility];  Service: Orthopedics;  Laterality: Left;  90 mins   TRANSTHORACIC ECHOCARDIOGRAM  05-04-2014   normal ,  ef 60-65%   UPPER GASTROINTESTINAL ENDOSCOPY     Prior to Admission medications   Medication Sig Start Date End Date Taking? Authorizing Provider  acetaminophen (TYLENOL) 500 MG tablet Take 500 mg by mouth 3 (three) times daily as needed for moderate pain.   Yes [provider]  amLODipine (NORVASC) 5 MG tablet Take 1 tablet (5 mg total) by mouth every evening. Patient taking differently: Take 2.5 mg by mouth every evening. 10/03/20 09/28/21 Yes Cantwell, Celeste C, PA-C  aspirin EC 81 MG tablet Take 81 mg by mouth daily. Swallow whole.   Yes  [provider]  Calcium Carbonate (CALCIUM 500 PO) Take 500 mg by mouth daily.   Yes [provider]  Cholecalciferol (VITAMIN D) 50 MCG (2000 UT) tablet Take 2,000 Units by mouth daily.   Yes [provider]  gabapentin (NEURONTIN) 300 MG capsule Take 300 mg by mouth 3 (three) times daily.   Yes [provider]  losartan-hydrochlorothiazide (HYZAAR) 100-12.5 MG tablet Take 1 tablet by mouth daily. 10/03/20  Yes Cantwell, Celeste C, PA-C  Multiple Vitamins-Minerals (MULTIVITAMIN WITH MINERALS) tablet Take 1 tablet by mouth daily.   Yes [provider]  nitrofurantoin (MACRODANTIN) 100 MG capsule Take 100 mg by mouth daily.   Yes [provider]  simvastatin (ZOCOR) 40 MG tablet TAKE ONE TABLET AT BEDTIME. Patient taking differently: Take 40 mg by mouth every evening. 09/10/20  Yes [name], MD  SYNTHROID 50 MCG tablet TAKE 1 TABLET DAILY BEFORE BREAKFAST FOR HYPOTHYROIDISM Patient taking differently: Take 50 mcg by mouth daily before breakfast. 09/24/20  Yes [name], MD  vitamin C (ASCORBIC ACID) 500 MG tablet Take 500 mg by mouth daily.   Yes [provider]  denosumab (PROLIA) 60 MG/ML SOSY injection Inject 60 mg into the skin every 6 (six) months.    [provider]       PHYSICAL EXAMINATION :  She weighs 139 pounds.  Temperature is 97.2, blood pressure is 123/70, pulse 73.  Pain is 5/10.  She is alert and oriented by 4.  She answers all questions appropriately.  Memory, language, attention span, and fund of knowledge are normal. Speech is clear, it is also fluent.  Hearing intact to voice.  She has minimal reflex, trace at the knees, trace at the ankles; 2+ biceps, triceps, and brachioradialis.  Pupils equal, round, and reactive to light.  Full extraocular movements.  Full visual fields.  Symmetric facial movements and sensation.       Gait is otherwise normal.  She can toe walk and heel walk.     IMAGING  :  MRI is reviewed. What it shows is facet arthropathy, ligamentous hypertrophy, and fairly profound lateral recess stenosis on the right side at L4-5.  Facet arthropathy is certainly present.  The ligament is hypertrophied on both sides and the listhesis is present.     ASSESSMENT AND PLAN :  Ms. Hilgert is going to give some time to think about this.  I would have her undergo an L4-5 posterior lumbar interbody fusion secondary to the arthropathy and the already present listhesis.  I do think that she would be better afterward.  I do think she has clearcut pathology.  But, there is never a  guarantee nor is this dangerous at this time.  We will see what she does.

## 2021-01-31 NOTE — Op Note (Signed)
01/31/2021  6:45 PM  PATIENT:  Dorothy Rojas  75 y.o. female With neurogenic claudication, facet hypertrophy and stenosis in the lateral recesses and canal PRE-OPERATIVE DIAGNOSIS:  Lumbar arthropathy, lumbar stenosis, neurogenic claudication  POST-OPERATIVE DIAGNOSIS:  Lumbar arthropathy, lumbar stenosis L4/5, neurogenic claudication  PROCEDURE:  Procedure(s): Lumbar 4-5 Posterior lumbar interbody fusion, [place] titanium cages 22x21mm x2 filled with autograft morsels, local L4 hemilaminectomy beyond the needed exposure for a PLIF Non segmental pedicle screw fixation L4-5(relign) screws SURGEON:  Surgeon(s): Ashok Pall, MD  ASSISTANTS:Dawley, Pieter Partridge  ANESTHESIA:   general  EBL:  Total I/O In: 1300 [I.V.:1000; IV Piggyback:300] Out: 435 [Urine:210; Blood:225]  BLOOD ADMINISTERED:none  CELL SAVER GIVEN:none  COUNT:per nursing  DRAINS: none   SPECIMEN:  No Specimen  DICTATION: [name] is a 75 y.o. female whom was taken to the operating room intubated, and placed under a general anesthetic without difficulty. A foley catheter was placed under sterile conditions. She was positioned prone on a Jackson table with all pressure points properly padded.  Her lumbar region was prepped and draped in a sterile manner. I infiltrated 20cc's 1/2%lidocaine/1:2000,000 strength epinephrine into the planned incision. I opened the skin with a 10 blade and took the incision down to the thoracolumbar fascia. I exposed the lamina of L3,4,and 5  in a subperiosteal fashion bilaterally. I confirmed my location with an intraoperative xray.  I placed self retaining retractors and started the decompression.  I decompressed the spinal canal via a hemilaminectomy of L4 beyond the needed exposure for a PLIF. I used the drill and Kerrison punches to perform the semihemilaminectomies, and L4 inferior facetectomies, and partial L5 superior facetectomies. This allowed for the lateral recesses and the  foramina to be decompressed along with the spinal canal. PLIF's were performed at L4/5 in the same fashion. I opened the disc space with a 15 blade then used a variety of instruments to remove the disc and prepare the space for the arthrodesis. I used curettes, rongeurs, punches, shavers for the disc space, and rasps in the discetomy. I measured the disc space and placed 10x72mm titanium cages packed with local autograft morsels and the disc space also packed with Morsels. Biochemist, clinical) into the disc space(s).   I placed pedicle screws at L4 and L5, using fluoroscopic guidance. I drilled a pilot hole, then cannulated the pedicle with a drill at each site. I then tapped each pedicle, assessing each site for pedicle violations. No cutouts were appreciated. Screws (nuvasive relign) were then placed at each site without difficulty. We attached rods and locking caps with the appropriate tools. The locking caps were secured with torque limited screwdrivers. Final films were performed and the final construct appeared to be in good position.  We closed the wound in a layered fashion. We approximated the thoracolumbar fascia, subcutaneous, and subcuticular planes with vicryl sutures. I used dermabond and an occlusive bandage for a sterile dressing.     PLAN OF CARE: Admit to inpatient   PATIENT DISPOSITION:  PACU - hemodynamically stable.   Delay start of Pharmacological VTE agent (>24hrs) due to surgical blood loss or risk of bleeding:  yes

## 2021-01-31 NOTE — Anesthesia Procedure Notes (Signed)
Procedure Name: Intubation Date/Time: 01/31/2021 2:55 PM Performed by: Valda Favia, CRNA Pre-anesthesia Checklist: Patient identified, Emergency Drugs available, Suction available and Patient being monitored Patient Re-evaluated:Patient Re-evaluated prior to induction Oxygen Delivery Method: Circle System Utilized Preoxygenation: Pre-oxygenation with 100% oxygen Induction Type: IV induction Ventilation: Mask ventilation without difficulty Laryngoscope Size: Mac and 4 Grade View: Grade II Tube type: Oral Tube size: 7.0 mm Number of attempts: 1 Airway Equipment and Method: Stylet and Oral airway Placement Confirmation: ETT inserted through vocal cords under direct vision, positive ETCO2 and breath sounds checked- equal and bilateral Secured at: 21 cm Tube secured with: Tape Dental Injury: Teeth and Oropharynx as per pre-operative assessment

## 2021-02-01 MED ORDER — HYDROCODONE-ACETAMINOPHEN 5-325 MG PO TABS: 1.0000 | ORAL_TABLET | Freq: Four times a day (QID) | ORAL | 0 refills | Status: AC | PRN

## 2021-02-01 MED ORDER — TIZANIDINE HCL 4 MG PO TABS: 4.0000 mg | ORAL_TABLET | Freq: Four times a day (QID) | ORAL | 0 refills | Status: DC | PRN

## 2021-02-01 MED ORDER — ACETAMINOPHEN 500 MG PO TABS
1000.0000 mg | ORAL_TABLET | Freq: Four times a day (QID) | ORAL | Status: DC | PRN
  Administered 2021-02-01: 1000 mg via ORAL
  Filled 2021-02-01: qty 2

## 2021-02-01 NOTE — Progress Notes (Signed)
Patient discharged to home  accompanied by husband. No c/o pain and no s/s of acute distress at the time of discharge.  Will f/u with Dr [name] as advised.

## 2021-02-01 NOTE — Evaluation (Signed)
Physical Therapy Evaluation Patient Details Name: Dorothy Rojas MRN: [id number] DOB: Jun 05, 1946 Today's Date: 02/01/2021   History of Present Illness  75 yo female s/p L4-5 fusion. PMH including arthritis, anemia, HTN, hypothyroidism, L TKA, and osteoporosis.  Clinical Impression  Pt admitted with above diagnosis. At the time of PT eval, pt was able to demonstrate transfers and ambulation with gross modified independence to min guard assist without AD. Pt was educated on precautions, positioning recommendations, appropriate activity progression, and car transfer. Pt currently with functional limitations due to the deficits listed below (see PT Problem List). Pt will benefit from skilled PT to increase their independence and safety with mobility to allow discharge to the venue listed below.      Follow Up Recommendations No PT follow up;Supervision for mobility/OOB    Equipment Recommendations  None recommended by PT    Recommendations for Other Services       Precautions / Restrictions Precautions Precautions: Back Precaution Booklet Issued: Yes (comment) Precaution Comments: Reviewed handout and precautions Required Braces or Orthoses: Other Brace Other Brace: No brace per MD order Restrictions Weight Bearing Restrictions: No      Mobility  Bed Mobility Overal bed mobility: Modified Independent             General bed mobility comments: Education on log roll tehcnique    Transfers Overall transfer level: Modified independent Equipment used: None             General transfer comment: Increased time however no assistance required.  Ambulation/Gait Ambulation/Gait assistance: Supervision; Min guard assist Gait Distance (Feet): 350 Feet Assistive device: None Gait Pattern/deviations: Step-through pattern;Decreased stride length;Trunk flexed Gait velocity: Decreased Gait velocity interpretation: <1.8 ft/sec, indicate of risk for recurrent falls General Gait  Details: VC's for improved posture. Pt ambulating well with occasional bouts of unsteadiness.  Stairs Stairs: Yes Stairs assistance: Min guard Stair Management: One rail Right;Step to pattern;Forwards Number of Stairs: 10 General stair comments: VC's for sequencing and general safety. No overt LOB noted. Close guard provided due to unsteadiness noted in the hall.  Wheelchair Mobility    Modified Rankin (Stroke Patients Only)       Balance Overall balance assessment: No apparent balance deficits (not formally assessed)                                           Pertinent Vitals/Pain Pain Assessment: Faces Faces Pain Scale: Hurts a little bit Pain Location: Incision site Pain Descriptors / Indicators: Operative site guarding Pain Intervention(s): Limited activity within patient's tolerance;Monitored during session;Repositioned    Home Living Family/patient expects to be discharged to:: Private residence Living Arrangements: Spouse/significant other Available Help at Discharge: Family;Available 24 hours/day Type of Home: House Home Access: Stairs to enter Entrance Stairs-Rails: None Entrance Stairs-Number of Steps: 2 Home Layout: Two level;Bed/bath upstairs Home Equipment: Bedside commode;Shower seat      Prior Function Level of Independence: Independent         Comments: ADLs, IADLs, 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, newspaper        Extremity/Trunk Assessment   Upper Extremity Assessment Upper Extremity Assessment: Defer to OT evaluation    Lower Extremity Assessment Lower Extremity Assessment: Generalized weakness (Consistent with pre-op diagnosis)    Cervical / Trunk Assessment Cervical / Trunk Assessment: Other exceptions Cervical / Trunk Exceptions: s/p Lumbar  fusion  Communication   Communication: No difficulties  Cognition Arousal/Alertness: Awake/alert Behavior During Therapy: WFL for tasks  assessed/performed Overall Cognitive Status: Within Functional Limits for tasks assessed                                 General Comments: Requiring slight increased in time when answering questions; feel this is due to pain medication      General Comments      Exercises     Assessment/Plan    PT Assessment Patient needs continued PT services  PT Problem List Decreased strength;Decreased activity tolerance;Decreased balance;Decreased mobility;Decreased knowledge of use of DME;Decreased safety awareness;Decreased knowledge of precautions;Pain       PT Treatment Interventions DME instruction;Gait training;Stair training;Functional mobility training;Therapeutic activities;Therapeutic exercise;Neuromuscular re-education;Patient/family education    PT Goals (Current goals can be found in the Care Plan section)  Acute Rehab PT Goals Patient Stated Goal: Home today, be able to get up her stairs by herself PT Goal Formulation: With patient Time For Goal Achievement: 02/08/21 Potential to Achieve Goals: Good    Frequency Min 5X/week   Barriers to discharge        Co-evaluation               AM-PAC PT "6 Clicks" Mobility  Outcome Measure Help needed turning from your back to your side while in a flat bed without using bedrails?: None Help needed moving from lying on your back to sitting on the side of a flat bed without using bedrails?: None Help needed moving to and from a bed to a chair (including a wheelchair)?: None Help needed standing up from a chair using your arms (e.g., wheelchair or bedside chair)?: None Help needed to walk in hospital room?: A Little Help needed climbing 3-5 steps with a railing? : A Little 6 Click Score: 22    End of Session Equipment Utilized During Treatment: Gait belt Activity Tolerance: Patient tolerated treatment well Patient left: in chair;with call bell/phone within reach Nurse Communication: Mobility status PT Visit  Diagnosis: Unsteadiness on feet (R26.81);Pain Pain - part of body:  (back)    Time: 2876-8115 PT Time Calculation (min) (ACUTE ONLY): 17 min   Charges:   PT Evaluation $PT Eval Low Complexity: 1 Low          Rolinda Roan, PT, DPT Acute Rehabilitation Services Pager: [phone] Office: [phone]   Thelma Comp 02/01/2021, 9:54 AM

## 2021-02-01 NOTE — Evaluation (Signed)
Occupational Therapy Evaluation Patient Details Name: Dorothy Rojas MRN: [id number] DOB: [birth year] Today's Date: 02/01/2021    History of Present Illness 75 yo female s/p L4-5 fusion. PMH including arthritis, anemia, HTN, hypothyroidism, L TKA, and osteoporosis.    Clinical Impression   PTA, pt was living with her husband and was independent. Currently, pt performing ADLs and functional mobility at Mod I level with increased time. Provided education and handout on back precautions, bed mobility, grooming, LB ADLs, toileting, and shower transfer; pt demonstrated understanding. Answered all pt questions. Recommend dc home once medically stable per physician. All acute OT needs met and will sign off. Thank you.    Follow Up Recommendations  No OT follow up    Equipment Recommendations  None recommended by OT    Recommendations for Other Services PT consult     Precautions / Restrictions Precautions Precautions: Back Precaution Booklet Issued: Yes (comment) Precaution Comments: Reviewed handout and precautions Required Braces or Orthoses: Other Brace Other Brace: No brace per MD order Restrictions Weight Bearing Restrictions: No      Mobility Bed Mobility Overal bed mobility: Modified Independent             General bed mobility comments: Education on log roll tehcnique    Transfers Overall transfer level: Modified independent               General transfer comment: no phsyical A. Increased time    Balance Overall balance assessment: No apparent balance deficits (not formally assessed)                                         ADL either performed or assessed with clinical judgement   ADL Overall ADL's : Modified independent                                       General ADL Comments: Pt performing ADLs and functional mobility at Mod I Level. Providing education and handout on back precautions and compensatory techniques  for grooming, bed mobility, LBADLs, toileting, and shower transfer.     Vision         Perception     Praxis      Pertinent Vitals/Pain Pain Assessment: Faces Faces Pain Scale: Hurts a little bit Pain Intervention(s): Monitored during session     Hand Dominance     Extremity/Trunk Assessment Upper Extremity Assessment Upper Extremity Assessment: Overall WFL for tasks assessed   Lower Extremity Assessment Lower Extremity Assessment: Defer to PT evaluation   Cervical / Trunk Assessment Cervical / Trunk Assessment: Other exceptions Cervical / Trunk Exceptions: s/p Lumbar fusion   Communication Communication Communication: No difficulties   Cognition Arousal/Alertness: Awake/alert Behavior During Therapy: WFL for tasks assessed/performed Overall Cognitive Status: Within Functional Limits for tasks assessed                                 General Comments: Requiring slight increased in time when answering questions; feel this is due to pain medication   General Comments       Exercises     Shoulder Instructions      Home Living Family/patient expects to be discharged to:: Private residence Living Arrangements: Spouse/significant other Available Help at Discharge: Family;Available  24 hours/day Type of Home: House Home Access: Stairs to enter [employer] of Steps: 2 Entrance Stairs-Rails: None Home Layout: Two level;Bed/bath upstairs Alternate Level Stairs-Number of Steps: Flight   Bathroom Shower/Tub: Walk-in shower;Tub/shower unit   Bathroom Toilet: Handicapped height     Home Equipment: Bedside commode;Shower seat          Prior Functioning/Environment          Comments: ADLs, IADLs, 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)        OT Problem List: Decreased range of motion;Decreased activity tolerance;Decreased knowledge of use of DME or AE;Decreased knowledge of precautions      OT Treatment/Interventions:      OT  Goals(Current goals can be found in the care plan section) Acute Rehab OT Goals Patient Stated Goal: Go home OT Goal Formulation: All assessment and education complete, DC therapy  OT Frequency:     Barriers to D/C:            Co-evaluation              AM-PAC OT "6 Clicks" Daily Activity     Outcome Measure Help from another person eating meals?: None Help from another person taking care of personal grooming?: None Help from another person toileting, which includes using toliet, bedpan, or urinal?: None Help from another person bathing (including washing, rinsing, drying)?: None Help from another person to put on and taking off regular upper body clothing?: None Help from another person to put on and taking off regular lower body clothing?: None 6 Click Score: 24   End of Session Nurse Communication: Mobility status  Activity Tolerance: Patient tolerated treatment well Patient left: in chair;with call bell/phone within reach;with nursing/sitter in room  OT Visit Diagnosis: Unsteadiness on feet (R26.81);Other abnormalities of gait and mobility (R26.89);Muscle weakness (generalized) (M62.81)                Time: 2707-8675 OT Time Calculation (min): 25 min Charges:  OT General Charges $OT Visit: 1 Visit OT Evaluation $OT Eval Low Complexity: 1 Low OT Treatments $Self Care/Home Management : 8-22 mins  Timofey Carandang MSOT, OTR/L Acute Rehab Pager: [phone] Office: [place] 02/01/2021, 8:46 AM

## 2021-02-01 NOTE — Discharge Instructions (Signed)

## 2021-02-03 NOTE — Anesthesia Postprocedure Evaluation (Signed)
Anesthesia Post Note  Patient: Dorothy Rojas  Procedure(s) Performed: Lumbar 4-5 Posterior lumbar interbody fusion     Patient location during evaluation: PACU Anesthesia Type: General Level of consciousness: awake and alert Pain management: pain level controlled Vital Signs Assessment: post-procedure vital signs reviewed and stable Respiratory status: spontaneous breathing, nonlabored ventilation, respiratory function stable and patient connected to nasal cannula oxygen Cardiovascular status: blood pressure returned to baseline and stable Postop Assessment: no apparent nausea or vomiting Anesthetic complications: no   No notable events documented.  Last Vitals:  Vitals:   02/01/21 1544 02/01/21 1922  BP: (!) 129/49 (!) 126/55  Pulse: 79 77  Resp: 18 18  Temp: 37 C 37.2 C  SpO2: 99% 95%    Last Pain:  Vitals:   02/01/21 1922  TempSrc: Oral  PainSc:    Pain Goal: Patients Stated Pain Goal: 3 (02/01/21 0338)                 Lorien Shingler S

## 2021-02-04 ENCOUNTER — Ambulatory Visit: Payer: Medicare Other | Admitting: Family Medicine

## 2021-02-04 ENCOUNTER — Other Ambulatory Visit: Payer: Self-pay | Admitting: Family Medicine

## 2021-02-04 DIAGNOSIS — E039 Hypothyroidism, unspecified: Secondary | ICD-10-CM

## 2021-02-07 NOTE — Discharge Summary (Signed)
Physician Discharge Summary  Patient ID: Dorothy Rojas MRN: [id number] DOB/AGE: Sep 11, 1945 75 y.o.  Admit date: 01/31/2021 Discharge date: 02/07/2021  Admission Diagnoses:Lumbar stenosis with neurogenic claudication, L4/5  Discharge Diagnoses: Lumbar arthropathy, lumbar stenosis L4/5, neurogenic claudication   Active Problems:   Lumbar stenosis with neurogenic claudication   Discharged Condition: good  Hospital Course: Dorothy Rojas was admitted and taken to the operating room for an uncomplicated decompression and arthrodesis at L4/5. Post operatively she is voiding, ambulating, and tolerating a regular diet. Her wound is clean, dry, without signs of infection  Treatments: surgery: Lumbar 4-5 Posterior lumbar interbody fusion, [place] titanium cages 22x35mm x2 filled with autograft morsels, local L4 hemilaminectomy beyond the needed exposure for a PLIF Non segmental pedicle screw fixation L4-5(relign) screws  Discharge Exam: Blood pressure (!) 126/55, pulse 77, temperature 98.9 F (37.2 C), temperature source Oral, resp. rate 18, height 5\' 4"  (1.626 m), weight 61.7 kg, SpO2 95 %. General appearance: alert, cooperative, appears stated age, and no distress Neurologic: Alert and oriented X 3, normal strength and tone. Normal symmetric reflexes. Normal coordination and gait  Disposition: Discharge disposition: 01-Home or Self Care      Lumbar arthropathy  Allergies as of 02/01/2021       Reactions   Naprosyn [naproxen] Rash        Medication List     TAKE these medications    acetaminophen 500 MG tablet Commonly known as: TYLENOL Take 500 mg by mouth 3 (three) times daily as needed for moderate pain.   aspirin EC 81 MG tablet Take 81 mg by mouth daily. Swallow whole.   CALCIUM 500 PO Take 500 mg by mouth daily.   gabapentin 300 MG capsule Commonly known as: NEURONTIN Take 300 mg by mouth 3 (three) times daily.   HYDROcodone-acetaminophen 5-325 MG  tablet Commonly known as: NORCO/VICODIN Take 1 tablet by mouth every 6 (six) hours as needed for up to 7 days for moderate pain.   losartan-hydrochlorothiazide 100-12.5 MG tablet Commonly known as: HYZAAR Take 1 tablet by mouth daily.   multivitamin with minerals tablet Take 1 tablet by mouth daily.   nitrofurantoin 100 MG capsule Commonly known as: MACRODANTIN Take 100 mg by mouth daily.   Prolia 60 MG/ML Sosy injection Generic drug: denosumab Inject 60 mg into the skin every 6 (six) months.   tiZANidine 4 MG tablet Commonly known as: ZANAFLEX Take 1 tablet (4 mg total) by mouth every 6 (six) hours as needed for muscle spasms.   vitamin C 500 MG tablet Commonly known as: ASCORBIC ACID Take 500 mg by mouth daily.   Vitamin D 50 MCG (2000 UT) tablet Take 2,000 Units by mouth daily.       ASK your doctor about these medications    amLODipine 5 MG tablet Commonly known as: NORVASC Take 1 tablet (5 mg total) by mouth every evening.   simvastatin 40 MG tablet Commonly known as: ZOCOR TAKE ONE TABLET AT BEDTIME.        Follow-up Information     Ashok Pall, MD Follow up in 3 week(s).   Specialty: Neurosurgery Why: please call to make an appointment Contact information: [address] [place] [address] [phone]                 Signed: Ashok Pall 02/07/2021, 1:42 PM

## 2021-02-21 ENCOUNTER — Ambulatory Visit: Payer: Medicare Other

## 2021-02-25 ENCOUNTER — Encounter: Payer: Self-pay | Admitting: Family Medicine

## 2021-02-25 DIAGNOSIS — M4316 Spondylolisthesis, lumbar region: Secondary | ICD-10-CM | POA: Diagnosis not present

## 2021-02-25 DIAGNOSIS — I10 Essential (primary) hypertension: Secondary | ICD-10-CM | POA: Diagnosis not present

## 2021-02-25 DIAGNOSIS — M47816 Spondylosis without myelopathy or radiculopathy, lumbar region: Secondary | ICD-10-CM | POA: Diagnosis not present

## 2021-03-02 ENCOUNTER — Other Ambulatory Visit: Payer: Self-pay | Admitting: Family Medicine

## 2021-03-02 DIAGNOSIS — E7800 Pure hypercholesterolemia, unspecified: Secondary | ICD-10-CM

## 2021-03-05 ENCOUNTER — Other Ambulatory Visit: Payer: Self-pay

## 2021-03-05 ENCOUNTER — Ambulatory Visit
Admission: RE | Admit: 2021-03-05 | Discharge: 2021-03-05 | Disposition: A | Discharge status: Home / Self Care | Payer: Medicare Other | Source: Ambulatory Visit | Attending: Family Medicine | Admitting: Family Medicine

## 2021-03-05 DIAGNOSIS — Z1231 Encounter for screening mammogram for malignant neoplasm of breast: Secondary | ICD-10-CM

## 2021-03-13 ENCOUNTER — Other Ambulatory Visit: Payer: Self-pay

## 2021-03-13 ENCOUNTER — Other Ambulatory Visit (INDEPENDENT_AMBULATORY_CARE_PROVIDER_SITE_OTHER): Payer: Medicare Other

## 2021-03-13 DIAGNOSIS — M810 Age-related osteoporosis without current pathological fracture: Secondary | ICD-10-CM | POA: Diagnosis not present

## 2021-03-13 MED ORDER — DENOSUMAB 60 MG/ML ~~LOC~~ SOSY
60.0000 mg | PREFILLED_SYRINGE | Freq: Once | SUBCUTANEOUS | Status: AC
  Administered 2021-03-13: 60 mg via SUBCUTANEOUS

## 2021-03-14 DIAGNOSIS — N8111 Cystocele, midline: Secondary | ICD-10-CM | POA: Diagnosis not present

## 2021-03-14 DIAGNOSIS — N3020 Other chronic cystitis without hematuria: Secondary | ICD-10-CM | POA: Diagnosis not present

## 2021-03-19 DIAGNOSIS — M4316 Spondylolisthesis, lumbar region: Secondary | ICD-10-CM | POA: Diagnosis not present

## 2021-03-24 ENCOUNTER — Encounter: Payer: Self-pay | Admitting: Family Medicine

## 2021-03-24 NOTE — Progress Notes (Signed)
Chief Complaint  Patient presents with   Annual Exam    AWV med check + no other issues pt. Aware to consider getting a pneumovax booster    Dorothy Rojas is a 75 y.o. female who presents for annual wellness visit and follow-up on chronic medical conditions.    She underwent back surgery in June with Dr. [name] (L4-5 posterior lumbar fusion). She has very mild R sided low back pain since the surgery, no radiation. She is doing much better, and has been walking and in the pool.  Hypertension follow-up: She is compliant with losartan-HCT and amlodipine. Medication changes were made by Dr. Einar Gip (changing from lisinopril to losartan, adding amlodipine, all within the last year). Edema resolved once the amlodipine dose was cut in half. Denies headaches or lightheadedness, syncope.  Denies side effects of medications. She denies exertional chest pain. She notes slight shortness of breath when walking outside in the humidity only.  Doesn't have any problems when walking on the indoor track at [facility]. BP's are running 120's/60's-70's.   GERD:  Has some reflux related to her diet.  Occasionally has symptoms related to [place] food, [employer]; Nexium OTC works well.  Denies dysphagia.   Hyperlipidemia follow-up:  Patient is reportedly following a low-fat, low cholesterol diet.  Compliant with medications (simvastatin '40mg'$ ) and denies medication side effects.  Lab Results  Component Value Date   CHOL 149 07/26/2020   HDL 61 07/26/2020   LDLCALC 66 07/26/2020   TRIG 125 07/26/2020   CHOLHDL 2.4 07/26/2020   Impaired fasting glucose.  She has had mildly elevated fasting sugars. A1c was back up to 6% last year, down to 5.8% in 01/2021. She continues to avoid sugar and limit carbs in her diet. 2 dark chocolate candy kisses after lunch or dinner, She switched to drinking a lower sugar wine (Klean), rather than non-alcoholic wine, [id number] glasses on Fridays and Saturdays only. Tries to limit carbs.   Lab Results  Component Value Date   HGBA1C 5.8 (H) 01/29/2021   H/o DVT-- s/p 6 months of anticoagulation with xarelto. She was discharged from the care of hematologist.  If any recurrence, will need lifelong treatment.   She is taking '81mg'$  of ASA daily. (factor V Leiden heterozygous). No other blood thinning agents were recommended.  Denies any swelling or pain.  Denies any bleeding/bruising.    MNG--denies symptoms/dysphagia or changes to her neck.  She had benign biopsies in 2020. Last [place] was in 01/2020: IMPRESSION: Heterogeneous thyroid, potentially indicating medical thyroid disease. No thyroid nodule meets criteria for biopsy or surveillance, as designated by the newly established ACR TI-RADS criteria.   Hypothyroidism:  She was started on 21mg Synthroid in 01/2019  when TSH 6.2 and reporrted dry skin, cold intolerance. Dose was increased to 558m in June 2021 when TSH was up to 4.780. Rechecks have been normal.  She denies changes to hair/skin/bowels/energy, constipation.  She reports compliance with medication, taking on an empty stomach, separate from other medications. Some dry skin in winter. She has lost some weight recently.  Lab Results  Component Value Date   TSH 2.930 07/26/2020    Osteoporosis:  She was started on Boniva in May 2017, after her DEXA done 12/10/15 showed T -2.5 at R fem neck. She tolerated it without side effects. She had DEXA done 01/2018, with T-2.3 at R fem neck (was -2.5 in 2017); Repeat DEXA in 01/2020 showed T-2.5 at R fem neck. [facility] was then [facility]  to Prolia injections, first injection 02/2020. Last injection was 03/13/2021. She denies any side effects.  Vitamin D-OH level was 68.5 in 05/2020.   She has chronic microscopic hematuria, under the care of Dr. McDiarmid. She takes nitrofurantoin once daily for UTI prevention (switched from Trimethoprim after getting break-through infections), and is doing well without recurrent infections. She is s/p  Macroplastique procedure x 2, last of which was in September 2016 and has had good results.  Urinary incontinence is overall pretty well controlled. Notices it when she wakes up in the morning and stands up, and also when getting up from the recliner--leaks prior to getting to the bathroom. Denies dysuria, hematuria.   Immunization History  Administered Date(s) Administered   Fluad Quad(high Dose 65+) 04/12/2019   Influenza Whole 04/09/2011   Influenza, High Dose Seasonal PF 05/08/2014, 05/01/2015, 05/26/2016, 05/22/2017, 06/23/2018   Influenza,inj,Quad PF,6+ Mos 04/14/2013   Influenza-Unspecified 05/22/2017, 05/04/2020   PFIZER(Purple Top)SARS-COV-2 Vaccination 09/15/2019, 10/10/2019, 05/05/2020, 11/17/2020   Pneumococcal Conjugate-13 11/01/2014, 05/11/2018   Pneumococcal Polysaccharide-23 04/09/2011   Td 08/07/2006   Tdap 11/01/2014   Zoster Recombinat (Shingrix) 07/28/2019, 11/03/2019   Zoster, Live 10/25/2008   Last Pap smear: can't recall. S/p hysterectomy for benign reasons   Last mammogram: 02/2021 Last colonoscopy:  09/2020, tubular adenomas.  Recheck due 5 years. Last DEXA: 01/2020, T-2.5 at R femur neck (was -2.3 in 01/2018, -2.5 in 12/2015). Ophtho: yearly Dentist: regularly, with dentist and periodontist every 3-4 months   Exercise: since back surgery in June, she is walking the track at [facility] x 30 minutes, about 4-5x/week. She is taking water fitness classes 2x/week (with weights).     Other doctors caring for patient include: Dr. Einar Gip (cardiology) Dr. McDiarmid (urology)   Dr. Gershon Crane (ophtho)   Dr. Henrene Pastor (GI) Dr. Conley Canal (dentist)   Dr. Juliann Mule (hematologist)--released from his care Dr. Alvan Dame (ortho-knee) Dr. Onnie Graham (ortho--shoulder) Dr. Wilhemina Bonito (dermatologist) Dr. [name] (neurosurgeon)     Depression screen:  negative Fall Screen: 1 fall, no injury (tripped on neighbor's doorstop--was shortly after her surgery, had x-rays, all ok) Functional Status screen:  notable only for urinary incontinence, some decreased hearing, unchanged. Sometimes forgets why she walked into a room, no significant memory concerns--not any worse than last year. Mini-Cog screen: normal See epic for full screens    End of Life Discussion:  Patient has a living will and medical power of attorney    PMH, PSH, SH and FH were reviewed and updated  Outpatient Encounter Medications as of 03/25/2021  Medication Sig   acetaminophen (TYLENOL) 500 MG tablet Take 500 mg by mouth 3 (three) times daily as needed for moderate pain.   amLODipine (NORVASC) 5 MG tablet Take 1 tablet (5 mg total) by mouth every evening. (Patient taking differently: Take 2.5 mg by mouth every evening.)   aspirin EC 81 MG tablet Take 81 mg by mouth daily. Swallow whole.   Calcium Carbonate (CALCIUM 500 PO) Take 500 mg by mouth daily.   Cholecalciferol (VITAMIN D) 50 MCG (2000 UT) tablet Take 2,000 Units by mouth daily.   denosumab (PROLIA) 60 MG/ML SOSY injection Inject 60 mg into the skin every 6 (six) months.   losartan-hydrochlorothiazide (HYZAAR) 100-12.5 MG tablet Take 1 tablet by mouth daily.   Multiple Vitamins-Minerals (MULTIVITAMIN WITH MINERALS) tablet Take 1 tablet by mouth daily.   nitrofurantoin (MACRODANTIN) 100 MG capsule Take 100 mg by mouth daily.   simvastatin (ZOCOR) 40 MG tablet TAKE ONE TABLET AT BEDTIME.   SYNTHROID  50 MCG tablet TAKE 1 TABLET DAILY BEFORE BREAKFAST FOR HYPOTHYROIDISM   vitamin C (ASCORBIC ACID) 500 MG tablet Take 500 mg by mouth daily.   gabapentin (NEURONTIN) 300 MG capsule Take 300 mg by mouth 3 (three) times daily.   tiZANidine (ZANAFLEX) 4 MG tablet Take 1 tablet (4 mg total) by mouth every 6 (six) hours as needed for muscle spasms.   No facility-administered encounter medications on file as of 03/25/2021.   Allergies  Allergen Reactions   Naprosyn [Naproxen] Rash    ROS:The patient denies anorexia, fever, headaches, vision changes, ear pain, sore throat,  breast concerns, chest pain, palpitations, syncope, cough, swelling, nausea, vomiting, diarrhea, abdominal pain, melena, hematochezia, hematuria, dysuria, vaginal bleeding, discharge, odor or itch, genital lesions, numbness, tingling, weakness, tremor, suspicious skin lesions, depression, anxiety, abnormal bleeding or enlarged lymph nodes.   Intermittent vertigo, usually only when she sits up quickly from laying down, short-lived. Urinary incontinence stable per HPI. Some hearing loss/trouble, especially in crowds, restaurants, unchanged Back pain--some at R lower back only, overall significantly improved No further edema (since amlodipine dose decreased) DOE very mild only related to humidity.   PHYSICAL EXAM:  BP 118/72   Pulse 82   Temp 97.8 F (36.6 C)   Ht 5' 4.25" (1.632 m)   Wt 130 lb 12.8 oz (59.3 kg)   LMP  (LMP Unknown)   BMI 22.28 kg/m   Wt Readings from Last 3 Encounters:  03/25/21 130 lb 12.8 oz (59.3 kg)  01/31/21 136 lb 0.4 oz (61.7 kg)  01/29/21 136 lb 1.6 oz (61.7 kg)    General Appearance:    Alert, cooperative, no distress, appears stated age.   Head:    Normocephalic, without obvious abnormality, atraumatic     Eyes:    PERRL, conjunctiva/corneas clear, EOM's intact, fundi benign     Ears:    Normal TM's and external ear canals     Nose:    Not examined, wearing mask due to COVID-19 pandemic  Throat:    Not examined, wearing mask due to COVID-19 pandemic     Neck:    Supple, no lymphadenopathy; thyroid: mild enlargement, no nodules; no tenderness/nodules; no carotid bruit or JVD     Back:    Spine nontender, no curvature, ROM normal, no CVA tenderness. WHSS midline lower back   Lungs:    Clear to auscultation bilaterally without wheezes, rales or ronchi; respirations unlabored     Chest Wall:    No tenderness or deformity     Heart:    Regular rate and rhythm, S1 and S2 normal, no murmur, rub or gallop     Breast Exam:    No tenderness, masses, or nipple  discharge. Nipples are inverted bilaterally, L>R (chronic, per pt). No axillary lymphadenopathy.     Abdomen:    Soft, non-tender, nondistended, normoactive bowel sounds, no masses, no hepatosplenomegaly     Genitalia:    Normal external genitalia without lesions. BUS and vagina normal; Uterus and adnexa surgically absent. No masses palpable     Rectal:    Normal tone, no masses or tenderness; guaiac-negative stool. Somewhat diminished sphincter tone, unchanged  Extremities:    No clubbing, cyanosis or edema. WHSS L knee  Pulses:    2+ and symmetric all extremities     Skin:    Skin color, texture, turgor normal, no rashes or suspicious lesions.  Many SK's.   Lymph nodes:    Cervical, supraclavicular, and axillary nodes normal  Neurologic:    Normal strength, sensation and gait; reflexes 2+ and symmetric throughout                       Psych:   Normal mood, affect, hygiene and grooming    Chart reviewed--A1c, chem, CBC prior to surgery in 01/2021.   ASSESSMENT/PLAN:  Medicare annual wellness visit, subsequent  Hypothyroidism, unspecified type - Cont Synthroid, euthyroid by history - Plan: TSH  Pure hypercholesterolemia - lipids at goal on simvastatin. Cont med and low cholesterol diet  Essential hypertension, benign - at goal on current regimen  Multinodular goiter - stable, asymptomatic  Aortic atherosclerosis (HCC) - cont statin  IFG (impaired fasting glucose) - cont to limit sugar and carbs in diet, regular exercise  Osteoporosis without current pathological fracture, unspecified osteoporosis type - Cont Ca, vitamin D, weight-bearing exercise, Prolia injections. DEXA due 01/2022  Heterozygous factor V Leiden mutation ([place]) - cont daily ASA '81mg'$   Need for vaccination for Strep pneumoniae - Plan: Pneumococcal polysaccharide vaccine 23-valent greater than or equal to 2yo subcutaneous/IM  Macrocytosis without anemia - Plan: CBC with Differential/Platelet  Medication monitoring  encounter - Plan: TSH, CBC with Differential/Platelet   Discussed monthly self breast exams and yearly mammograms; at least 30 minutes of aerobic activity at least 5 days/week and weight-bearing exercise 2x/week; proper sunscreen use reviewed; healthy diet, including goals of calcium and vitamin D intake and alcohol recommendations (less than or equal to 1 drink/day) reviewed; regular seatbelt use; changing batteries in smoke detectors.  Immunization recommendations discussed-- continue yearly high dose flu shots. Pneumovax booster given today. Colonoscopy recommendations reviewed, UTD. DEXA due again 01/2022.   MOST form reviewed, unchanged. Full Code, Full Care   F/u 6 mos for med check.   Medicare Attestation I have personally reviewed: The patient's medical and social history Their use of alcohol, tobacco or illicit drugs Their current medications and supplements The patient's functional ability including ADLs,fall risks, home safety risks, cognitive, and hearing and visual impairment Diet and physical activities Evidence for depression or mood disorders  The patient's weight, height, BMI have been recorded in the chart.  I have made referrals, counseling, and provided education to the patient based on review of the above and I have provided the patient with a written personalized care plan for preventive services.

## 2021-03-24 NOTE — Patient Instructions (Signed)
  HEALTH MAINTENANCE RECOMMENDATIONS:  It is recommended that you get at least 30 minutes of aerobic exercise at least 5 days/week (for weight loss, you may need as much as 60-90 minutes). This can be any activity that gets your heart rate up. This can be divided in 10-15 minute intervals if needed, but try and build up your endurance at least once a week.  Weight bearing exercise is also recommended twice weekly.  Eat a healthy diet with lots of vegetables, fruits and fiber.  "Colorful" foods have a lot of vitamins (ie green vegetables, tomatoes, red peppers, etc).  Limit sweet tea, regular sodas and alcoholic beverages, all of which has a lot of calories and sugar.  Up to 1 alcoholic drink daily may be beneficial for women (unless trying to lose weight, watch sugars).  Drink a lot of water.  Calcium recommendations are 1200-1500 mg daily (1500 mg for postmenopausal women or women without ovaries), and vitamin D 1000 IU daily.  This should be obtained from diet and/or supplements (vitamins), and calcium should not be taken all at once, but in divided doses.  Monthly self breast exams and yearly mammograms for women over the age of 80 is recommended.  Sunscreen of at least SPF 30 should be used on all sun-exposed parts of the skin when outside between the hours of 10 am and 4 pm (not just when at beach or pool, but even with exercise, golf, tennis, and yard work!)  Use a sunscreen that says "broad spectrum" so it covers both UVA and UVB rays, and make sure to reapply every 1-2 hours.  Remember to change the batteries in your smoke detectors when changing your clock times in the spring and fall. Carbon monoxide detectors are recommended for your home.  Use your seat belt every time you are in a car, and please drive safely and not be distracted with cell phones and texting while driving.    Dorothy Rojas , Thank you for taking time to come for your Medicare Wellness Visit. I appreciate your ongoing  commitment to your health goals. Please review the following plan we discussed and let me know if I can assist you in the future.   These are the goals we discussed:  Goals   None     This is a list of the screening recommended for you and due dates:  Health Maintenance  Topic Date Due   Flu Shot  03/11/2021   Mammogram  03/05/2022   Tetanus Vaccine  10/31/2024   Colon Cancer Screening  10/01/2025   DEXA scan (bone density measurement)  Completed   COVID-19 Vaccine  Completed   Hepatitis C Screening: USPSTF Recommendation to screen - Ages 25-79 yo.  Completed   Pneumonia vaccines  Completed   Zoster (Shingles) Vaccine  Completed   HPV Vaccine  Aged Out   Continue yearly high dose flu shots. Continue Prolia injections every 6 months. Next bone density test will be due 01/2022 Continue yearly mammograms.  Pay attention to the news regarding new and improved COVID boosters, should be coming out late Fall (better coverage for the new variants).

## 2021-03-25 ENCOUNTER — Other Ambulatory Visit: Payer: Self-pay

## 2021-03-25 ENCOUNTER — Ambulatory Visit (INDEPENDENT_AMBULATORY_CARE_PROVIDER_SITE_OTHER): Payer: Medicare Other | Admitting: Family Medicine

## 2021-03-25 ENCOUNTER — Encounter: Payer: Self-pay | Admitting: Family Medicine

## 2021-03-25 VITALS — BP 118/72 | HR 82 | Temp 97.8°F | Ht 64.25 in | Wt 130.8 lb

## 2021-03-25 DIAGNOSIS — M810 Age-related osteoporosis without current pathological fracture: Secondary | ICD-10-CM

## 2021-03-25 DIAGNOSIS — E039 Hypothyroidism, unspecified: Secondary | ICD-10-CM

## 2021-03-25 DIAGNOSIS — I700 Atherosclerosis of aorta: Secondary | ICD-10-CM | POA: Diagnosis not present

## 2021-03-25 DIAGNOSIS — D7589 Other specified diseases of blood and blood-forming organs: Secondary | ICD-10-CM

## 2021-03-25 DIAGNOSIS — D6851 Activated protein C resistance: Secondary | ICD-10-CM

## 2021-03-25 DIAGNOSIS — E042 Nontoxic multinodular goiter: Secondary | ICD-10-CM

## 2021-03-25 DIAGNOSIS — Z0000 Encounter for general adult medical examination without abnormal findings: Secondary | ICD-10-CM | POA: Diagnosis not present

## 2021-03-25 DIAGNOSIS — Z23 Encounter for immunization: Secondary | ICD-10-CM

## 2021-03-25 DIAGNOSIS — R7301 Impaired fasting glucose: Secondary | ICD-10-CM | POA: Diagnosis not present

## 2021-03-25 DIAGNOSIS — Z5181 Encounter for therapeutic drug level monitoring: Secondary | ICD-10-CM | POA: Diagnosis not present

## 2021-03-25 DIAGNOSIS — E7800 Pure hypercholesterolemia, unspecified: Secondary | ICD-10-CM

## 2021-03-25 DIAGNOSIS — I10 Essential (primary) hypertension: Secondary | ICD-10-CM

## 2021-03-26 LAB — CBC WITH DIFFERENTIAL/PLATELET
Basophils Absolute: 0.1 10*3/uL (ref 0.0–0.2)
Basos: 1 %
EOS (ABSOLUTE): 0.3 10*3/uL (ref 0.0–0.4)
Eos: 3 %
Hematocrit: 37 % (ref 34.0–46.6)
Hemoglobin: 12.3 g/dL (ref 11.1–15.9)
Immature Grans (Abs): 0 10*3/uL (ref 0.0–0.1)
Immature Granulocytes: 0 %
Lymphocytes Absolute: 3.1 10*3/uL (ref 0.7–3.1)
Lymphs: 32 %
MCH: 33.2 pg — ABNORMAL HIGH (ref 26.6–33.0)
MCHC: 33.2 g/dL (ref 31.5–35.7)
MCV: 100 fL — ABNORMAL HIGH (ref 79–97)
Monocytes Absolute: 0.7 10*3/uL (ref 0.1–0.9)
Monocytes: 8 %
Neutrophils Absolute: 5.4 10*3/uL (ref 1.4–7.0)
Neutrophils: 56 %
Platelets: 253 10*3/uL (ref 150–450)
RBC: 3.7 x10E6/uL — ABNORMAL LOW (ref 3.77–5.28)
RDW: 12.5 % (ref 11.7–15.4)
WBC: 9.5 10*3/uL (ref 3.4–10.8)

## 2021-03-26 LAB — TSH: TSH: 1.77 u[IU]/mL (ref 0.450–4.500)

## 2021-04-16 ENCOUNTER — Encounter: Payer: Self-pay | Admitting: Family Medicine

## 2021-04-16 DIAGNOSIS — Z23 Encounter for immunization: Secondary | ICD-10-CM | POA: Diagnosis not present

## 2021-04-23 DIAGNOSIS — Z961 Presence of intraocular lens: Secondary | ICD-10-CM | POA: Diagnosis not present

## 2021-04-28 ENCOUNTER — Encounter: Payer: Self-pay | Admitting: Family Medicine

## 2021-05-02 ENCOUNTER — Encounter: Payer: Self-pay | Admitting: Family Medicine

## 2021-05-03 ENCOUNTER — Telehealth (INDEPENDENT_AMBULATORY_CARE_PROVIDER_SITE_OTHER): Payer: Medicare Other | Admitting: Medical

## 2021-05-03 ENCOUNTER — Other Ambulatory Visit (INDEPENDENT_AMBULATORY_CARE_PROVIDER_SITE_OTHER): Payer: Medicare Other

## 2021-05-03 ENCOUNTER — Telehealth: Payer: Self-pay

## 2021-05-03 ENCOUNTER — Other Ambulatory Visit: Payer: Self-pay

## 2021-05-03 VITALS — Temp 100.8°F | Wt 129.0 lb

## 2021-05-03 DIAGNOSIS — Z20822 Contact with and (suspected) exposure to covid-19: Secondary | ICD-10-CM

## 2021-05-03 DIAGNOSIS — R059 Cough, unspecified: Secondary | ICD-10-CM | POA: Diagnosis not present

## 2021-05-03 DIAGNOSIS — J988 Other specified respiratory disorders: Secondary | ICD-10-CM | POA: Diagnosis not present

## 2021-05-03 LAB — POCT INFLUENZA A/B
Influenza A, POC: NEGATIVE
Influenza B, POC: NEGATIVE

## 2021-05-03 LAB — POC COVID19 BINAXNOW: SARS Coronavirus 2 Ag: NEGATIVE

## 2021-05-03 NOTE — Progress Notes (Signed)
Subjective:     Patient ID: Dorothy Rojas, female   DOB: 1946-06-03, 75 y.o.   MRN: [id number]  This visit type was conducted due to national recommendations for restrictions regarding the COVID-19 Pandemic (e.g. social distancing) in an effort to limit this patient's exposure and mitigate transmission in our community.  Due to their co-morbid illnesses, this patient is at least at moderate risk for complications without adequate follow up.  This format is felt to be most appropriate for this patient at this time.    Documentation for virtual audio and video telecommunications through [place] encounter:  The patient was located at home. The provider was located in the office. The patient did consent to this visit and is aware of possible charges through their insurance for this visit.  The other persons participating in this telemedicine service were none. Time spent on call was 20 minutes and in review of previous records 20 minutes total.  This virtual service is not related to other E/M service within previous 7 days.   HPI Chief Complaint  Patient presents with   possible covid    Symptoms, runny noses, cough, sore throat, fever 100.8  started yesterday Husband has covid. Covid tests- been negative Wednesday, negative yesterday.    Virtual consult for illness.  She reports 1 day hx/o cough, sore throat, fever, runny nose.  Has had some body aches, chills.  No wheezing , no SOB, no NVD.  Coughing a little, nonproductive.   No loss of smell or taste.  No headache.  Husband + for covid, and had had symptoms all week, cough, sore throat.  She was around him the first day of his symptoms then separated/quarantined in the house after that but was around him the first day.  She did 2 home tests this week, both negative.  Since husband symptoms were mild his PCP did not prescribe antivirals for him.   She is using tylenol.    She does take nitrofurantoin daily per Urology for chronic  UTI.   Past Medical History:  Diagnosis Date   Allergy    Anemia    Arthritis    knees   Cataract    removed both eyes    Chronic constipation    Clotting disorder [facility])    DVT 2014   Colon polyp 06/2015   tubular adenoma (sigmoid)   Diverticulosis    seen on colonoscopy 06/2015   GERD (gastroesophageal reflux disease)    normal EGD 06/2015   Heart murmur    as a child    Heterozygous factor V Leiden mutation [facility]) dx 07/2013     hematologist--  dr Bernadene Bell ([facility])--  per note "Low Risk"   History of DVT of lower extremity    dx 07-28-2013  left lower extrem.  --  xarelto for 6 months   Hyperlipidemia    Hypertension    Hypothyroidism    Low back pain 06/2020   MRI per ortho--mild-mod L4-5 spinal stenosis, mod-severe R foraminal stenosis, medial diasplacement of R L5. Mod-severe facet arthrosis L4-5, L5-S1   Lymphadenopathy, inguinal    bilateral   Microscopic hematuria    chronic    Multinodular goiter    Multinodular thyroid    a. Path benign 2013.   OAB (overactive bladder)    Osteoporosis 2017   osteopenia since 2011; osteoporosis 12/2015   Pre-diabetes    no meds, managaed with lifestyle changes    Prediabetes    SUI (stress  urinary incontinence, female)    Tricuspid regurgitation    mild-mod, noted on echo 03/2016   Current Outpatient Medications on File Prior to Visit  Medication Sig Dispense Refill   acetaminophen (TYLENOL) 500 MG tablet Take 500 mg by mouth 3 (three) times daily as needed for moderate pain.     amLODipine (NORVASC) 5 MG tablet Take 1 tablet (5 mg total) by mouth every evening. (Patient taking differently: Take 2.5 mg by mouth every evening.) 90 tablet 3   aspirin EC 81 MG tablet Take 81 mg by mouth daily. Swallow whole.     Calcium Carbonate (CALCIUM 500 PO) Take 500 mg by mouth daily.     Cholecalciferol (VITAMIN D) 50 MCG (2000 UT) tablet Take 2,000 Units by mouth daily.     denosumab (PROLIA) 60 MG/ML SOSY injection  Inject 60 mg into the skin every 6 (six) months.     losartan-hydrochlorothiazide (HYZAAR) 100-12.5 MG tablet Take 1 tablet by mouth daily. 90 tablet 3   Multiple Vitamins-Minerals (MULTIVITAMIN WITH MINERALS) tablet Take 1 tablet by mouth daily.     nitrofurantoin (MACRODANTIN) 100 MG capsule Take 100 mg by mouth daily.     simvastatin (ZOCOR) 40 MG tablet TAKE ONE TABLET AT BEDTIME. 90 tablet 0   SYNTHROID 50 MCG tablet TAKE 1 TABLET DAILY BEFORE BREAKFAST FOR HYPOTHYROIDISM 90 tablet 0   vitamin C (ASCORBIC ACID) 500 MG tablet Take 500 mg by mouth daily.     No current facility-administered medications on file prior to visit.     Review of Systems As in subjective    Objective:   Physical Exam Due to coronavirus pandemic stay at home measures, patient visit was virtual and they were not examined in person.   Temp (!) 100.8 F (38.2 C)   Wt 129 lb (58.5 kg)   LMP  (LMP Unknown)   BMI 21.97 kg/m   Gen: nad No cough or labored breathing answers questions in complete sentences     Assessment:     Encounter Diagnoses  Name Primary?   Cough Yes   Close exposure to COVID-19 virus    Respiratory tract infection        Plan:     We discussed her symptoms and concerns She would like to come up for PCR and rapid testing here which we will do this morning in our back parking lot Advised rest, hydration, Tylenol for aches and pains and fever, salt water gargles for sore throat Consider Robitussin-DM or Benadryl for congestion and cough consider over-the-counter Airborne or EmergenC OTC vitamin pack We discussed the possibility of antiviral/monoclonal antibody treatments. Currently her symptoms are mild x1 day She will continue to quarantine for now   Genevive was seen today for possible covid.  Diagnoses and all orders for this visit:  Cough  Close exposure to COVID-19 virus  Respiratory tract infection  F/u in our back parking lot today for testing

## 2021-05-03 NOTE — Progress Notes (Signed)
Pt was negative for cold and flu. PCR was sent out. [place]

## 2021-05-03 NOTE — Telephone Encounter (Signed)
Pt was negative for cold and flu. PCR was sent out. [place]

## 2021-05-04 LAB — NOVEL CORONAVIRUS, NAA: SARS-CoV-2, NAA: NOT DETECTED

## 2021-05-04 LAB — SARS-COV-2, NAA 2 DAY TAT

## 2021-05-06 ENCOUNTER — Encounter: Payer: Self-pay | Admitting: Family Medicine

## 2021-05-13 ENCOUNTER — Encounter: Payer: Self-pay | Admitting: Family Medicine

## 2021-05-13 DIAGNOSIS — M25551 Pain in right hip: Secondary | ICD-10-CM | POA: Diagnosis not present

## 2021-05-27 DIAGNOSIS — M4316 Spondylolisthesis, lumbar region: Secondary | ICD-10-CM | POA: Diagnosis not present

## 2021-06-12 ENCOUNTER — Encounter: Payer: Self-pay | Admitting: Family Medicine

## 2021-06-16 NOTE — Progress Notes (Signed)
Chief Complaint  Patient presents with   Cramping    Foot and leg cramping, has subsided. Was really bad when she was in [place]. Last week and the week before. Toes would curl up and would cramp up the back of her calves. Still has left heel pain, thinks from the cramping.    Immunizations    Had covid about 5 weeks ago, should she wait 2 months to get new booster?    Patient sent message on 11/2: "For over a week now, I have been suffering with really bad foot & leg cramps several times a day.  Very painful. I feel like I drink enough water. Any suggestions as to how I can get rid of the cramps or what might be causing them?"  She reports that the cramping started the week before traveling, was worse when she was in [place]. One in the left foot was terribly painful, and in one of her shoes the bottom of her heel hurt.  Not having pain today in her current shoes. No h/o PF in the past. Admits that she likely wasn't drinking enough water.  She made more of an effort to drink more water and "they just stopped!".  She had a slight cramp yesterday, but no longer getting them multiple times per day or waking her up at night.   On Losartan HCT and amlodipine for hypertension. Last b-met 01/2021 was normal  Hypothyroidism Lab Results  Component Value Date   TSH 1.770 03/25/2021    PMH, PSH, SH reviewed  Outpatient Encounter Medications as of 06/17/2021  Medication Sig Note   acetaminophen (TYLENOL) 500 MG tablet Take 1,000 mg by mouth 3 (three) times daily as needed for moderate pain. 06/17/2021: Last dose 8:30am   amLODipine (NORVASC) 5 MG tablet Take 1 tablet (5 mg total) by mouth every evening. (Patient taking differently: Take 2.5 mg by mouth every evening.)    aspirin EC 81 MG tablet Take 81 mg by mouth daily. Swallow whole.    Calcium Carbonate (CALCIUM 500 PO) Take 500 mg by mouth daily.    Cholecalciferol (VITAMIN D) 50 MCG (2000 UT) tablet Take 2,000 Units by mouth daily.    denosumab  (PROLIA) 60 MG/ML SOSY injection Inject 60 mg into the skin every 6 (six) months.    losartan-hydrochlorothiazide (HYZAAR) 100-12.5 MG tablet Take 1 tablet by mouth daily.    Multiple Vitamins-Minerals (MULTIVITAMIN WITH MINERALS) tablet Take 1 tablet by mouth daily.    simvastatin (ZOCOR) 40 MG tablet TAKE ONE TABLET AT BEDTIME.    SYNTHROID 50 MCG tablet TAKE 1 TABLET DAILY BEFORE BREAKFAST FOR HYPOTHYROIDISM    vitamin C (ASCORBIC ACID) 500 MG tablet Take 500 mg by mouth daily.    nitrofurantoin (MACRODANTIN) 100 MG capsule Take 100 mg by mouth daily. (Patient not taking: Reported on 06/17/2021) 06/17/2021: Stop taking 06/13/21   No facility-administered encounter medications on file as of 06/17/2021.   Allergies  Allergen Reactions   Naprosyn [Naproxen] Rash    ROS: Denies fever, chills, URI symptoms, headaches, dizziness, shortness of breath, chest pain, edema.  Denies nausea, vomiting, bowel changes, bleeding, bruising, rash.  She has had some more leakage of urine again, and is scheduled to see urologist. Denies dysuria, hematuria. See HPI   PHYSICAL EXAM:  BP 110/60   Pulse 84   Temp 98 F (36.7 C) (Tympanic)   Ht 5' 4.25" (1.632 m)   Wt 132 lb (59.9 kg)   LMP  (LMP Unknown)  BMI 22.48 kg/m   Wt Readings from Last 3 Encounters:  06/17/21 132 lb (59.9 kg)  05/03/21 129 lb (58.5 kg)  03/25/21 130 lb 12.8 oz (59.3 kg)   Well-appearing, pleasant female, in no distress HEENT: conjunctiva and sclera are clear, EOMI. Wearing mask Neck: no lymphadenopathy or mass Heart: regular rate and rhythm Lungs: clear Extremities: no edema, normal pulses. Mildly tender at L achilles and L anteromedial calcaneous, slightly along arch.  No swelling, erythema, warmth. Calves nontender    ASSESSMENT/PLAN:  Leg cramps - for the most-part, this has resolved/improved with increased hydration. Ddx reviewed. Cont water intake. if worse, check lytes,Mg  Pain of left heel - component of PF  and also achilles tenderness. Suspect related to the shoes she wore when OOT. Supportive shoes, stretches, icing prn   F/u as sched for 09/2021, sooner prn

## 2021-06-17 ENCOUNTER — Other Ambulatory Visit: Payer: Self-pay

## 2021-06-17 ENCOUNTER — Encounter: Payer: Self-pay | Admitting: Family Medicine

## 2021-06-17 ENCOUNTER — Ambulatory Visit (INDEPENDENT_AMBULATORY_CARE_PROVIDER_SITE_OTHER): Payer: Medicare Other | Admitting: Family Medicine

## 2021-06-17 VITALS — BP 110/60 | HR 84 | Temp 98.0°F | Ht 64.25 in | Wt 132.0 lb

## 2021-06-17 DIAGNOSIS — M79672 Pain in left foot: Secondary | ICD-10-CM

## 2021-06-17 DIAGNOSIS — R252 Cramp and spasm: Secondary | ICD-10-CM

## 2021-06-19 ENCOUNTER — Other Ambulatory Visit: Payer: Self-pay | Admitting: Family Medicine

## 2021-06-19 DIAGNOSIS — E7800 Pure hypercholesterolemia, unspecified: Secondary | ICD-10-CM

## 2021-06-20 DIAGNOSIS — N393 Stress incontinence (female) (male): Secondary | ICD-10-CM | POA: Diagnosis not present

## 2021-06-20 DIAGNOSIS — N3020 Other chronic cystitis without hematuria: Secondary | ICD-10-CM | POA: Diagnosis not present

## 2021-06-20 DIAGNOSIS — N8111 Cystocele, midline: Secondary | ICD-10-CM | POA: Diagnosis not present

## 2021-06-20 DIAGNOSIS — R8271 Bacteriuria: Secondary | ICD-10-CM | POA: Diagnosis not present

## 2021-06-25 DIAGNOSIS — R278 Other lack of coordination: Secondary | ICD-10-CM | POA: Diagnosis not present

## 2021-06-25 DIAGNOSIS — M6281 Muscle weakness (generalized): Secondary | ICD-10-CM | POA: Diagnosis not present

## 2021-06-25 DIAGNOSIS — N393 Stress incontinence (female) (male): Secondary | ICD-10-CM | POA: Diagnosis not present

## 2021-07-08 DIAGNOSIS — Z23 Encounter for immunization: Secondary | ICD-10-CM | POA: Diagnosis not present

## 2021-07-09 DIAGNOSIS — N8111 Cystocele, midline: Secondary | ICD-10-CM | POA: Diagnosis not present

## 2021-07-09 DIAGNOSIS — M6281 Muscle weakness (generalized): Secondary | ICD-10-CM | POA: Diagnosis not present

## 2021-07-09 DIAGNOSIS — N393 Stress incontinence (female) (male): Secondary | ICD-10-CM | POA: Diagnosis not present

## 2021-07-15 ENCOUNTER — Other Ambulatory Visit: Payer: Self-pay | Admitting: Family Medicine

## 2021-07-15 DIAGNOSIS — E039 Hypothyroidism, unspecified: Secondary | ICD-10-CM

## 2021-07-23 DIAGNOSIS — N393 Stress incontinence (female) (male): Secondary | ICD-10-CM | POA: Diagnosis not present

## 2021-07-23 DIAGNOSIS — R278 Other lack of coordination: Secondary | ICD-10-CM | POA: Diagnosis not present

## 2021-07-23 DIAGNOSIS — M6281 Muscle weakness (generalized): Secondary | ICD-10-CM | POA: Diagnosis not present

## 2021-08-13 ENCOUNTER — Telehealth: Payer: Self-pay | Admitting: Family Medicine

## 2021-08-13 NOTE — Chronic Care Management (AMB) (Signed)
°  Chronic Care Management   Note  08/13/2021 Name: REAGAN BEHLKE MRN: [id number] DOB: [birth year]  ANJELINA DUNG is a 76 y.o. year old female who is a primary care patient of [name], MD. I reached out to [name] by phone today in response to a referral sent by Ms. Dorethea Clan PCP, [name], MD.   Ms. Tourville was given information about Chronic Care Management services today including:  CCM service includes personalized support from designated clinical staff supervised by her physician, including individualized plan of care and coordination with other care providers 24/7 contact phone numbers for assistance for urgent and routine care needs. Service will only be billed when office clinical staff spend 20 minutes or more in a month to coordinate care. Only one practitioner may furnish and bill the service in a calendar month. The patient may stop CCM services at any time (effective at the end of the month) by phone call to the office staff.   Patient agreed to services and verbal consent obtained.   Follow up plan:   Tatjana Secretary/administrator

## 2021-08-21 DIAGNOSIS — N393 Stress incontinence (female) (male): Secondary | ICD-10-CM | POA: Diagnosis not present

## 2021-08-21 DIAGNOSIS — M6281 Muscle weakness (generalized): Secondary | ICD-10-CM | POA: Diagnosis not present

## 2021-08-21 DIAGNOSIS — R278 Other lack of coordination: Secondary | ICD-10-CM | POA: Diagnosis not present

## 2021-09-03 ENCOUNTER — Telehealth: Payer: Self-pay | Admitting: Family Medicine

## 2021-09-03 NOTE — Telephone Encounter (Signed)
That's ok, if she doesn't want a NV when due.

## 2021-09-03 NOTE — Telephone Encounter (Signed)
I called pt concerning her next Prolia injection. Pt is due around Feb 4. Pt has an appt on 09/25/21 and would like to know if she can have Prolia at that time. Please advise if ok for pt to receive a little late.

## 2021-09-04 NOTE — Telephone Encounter (Signed)
Called pt and left message advising ok for medcheck in Feb.

## 2021-09-19 DIAGNOSIS — N393 Stress incontinence (female) (male): Secondary | ICD-10-CM | POA: Diagnosis not present

## 2021-09-19 DIAGNOSIS — M6281 Muscle weakness (generalized): Secondary | ICD-10-CM | POA: Diagnosis not present

## 2021-09-20 ENCOUNTER — Other Ambulatory Visit: Payer: Self-pay | Admitting: Family Medicine

## 2021-09-20 DIAGNOSIS — E7800 Pure hypercholesterolemia, unspecified: Secondary | ICD-10-CM

## 2021-09-24 NOTE — Progress Notes (Signed)
Chief Complaint  Patient presents with   Hypertension    Nonfasting med check. Since patient is not fasting, I will do A1c now. No new concerns. Has covid booster in Nov.    Patient presents for 6 month follow-up on chronic problems, and for her Prolia injection.  Hypertension follow-up: She is compliant with losartan-HCT and amlodipine. There is some question as to her dose.  She stated she was cutting 10mg  tablet in half, but on review of her rx from last year, she should have a 5mg  tablet (which she has been cutting in half).  Last visit with Dr. Einar Gip was a year ago.  Denies headaches or lightheadedness, syncope.  Denies side effects of medications. She denies exertional chest pain. She previously noted slight shortness of breath when walking outside in the humidity only.  Doesn't have any problems when walking on the indoor track at [facility]. She has been very active at the gym, without any chest pain or shortness of breath. BP's are running 130/70, but not checking frequently.   GERD:  Has some reflux related to her diet.  Occasionally has symptoms related to [place] food, [employer]; Nexium OTC works well.  Denies dysphagia.   Hyperlipidemia follow-up:  Patient is reportedly following a low-fat, low cholesterol diet.  Compliant with medications (simvastatin 40mg ) and denies medication side effects. Due for recheck. Lab Results  Component Value Date   CHOL 149 07/26/2020   HDL 61 07/26/2020   LDLCALC 66 07/26/2020   TRIG 125 07/26/2020   CHOLHDL 2.4 07/26/2020    Impaired fasting glucose.  She has had mildly elevated fasting sugars. A1c was back up to 6% in 01/2020, down to 5.8% in 01/2021. She continues to avoid sugar and limit carbs in her diet. 2 dark chocolate candy kisses after lunch or dinner. She drinks a lower sugar wine (Klean), 1.5-2 glasses on Fridays and Saturdays only. Tries to limit carbs.    H/o DVT-- s/p 6 months of anticoagulation with xarelto. She was discharged  from the care of hematologist.  If any recurrence, will need lifelong treatment.   She is taking 81mg  of ASA daily. (factor V Leiden heterozygous). No other blood thinning agents were recommended.  Denies any swelling or pain.  Denies any bleeding/bruising.  Denies any abdominal pain.   MNG--denies symptoms/dysphagia or changes to her neck.  She had benign biopsies in 2020. Last [place] was in 01/2020: IMPRESSION: Heterogeneous thyroid, potentially indicating medical thyroid disease. No thyroid nodule meets criteria for biopsy or surveillance, as designated by the newly established ACR TI-RADS criteria.   Hypothyroidism:  She was started on 77mcg Synthroid in 01/2019  when TSH 6.2 and reported dry skin, cold intolerance. Dose was increased to 81mcg in June 2021 when TSH was up to 4.780. Rechecks have been normal.  She denies changes to hair/skin/bowels/energy, constipation.  She reports compliance with medication, taking on an empty stomach, separate from other medications. Some dry skin in winter.  Lab Results  Component Value Date   TSH 1.770 03/25/2021    Osteoporosis:  She was started on Boniva in May 2017, after her DEXA done 12/10/15 showed T -2.5 at R fem neck. She tolerated it without side effects. She had DEXA done 01/2018, with T-2.3 at R fem neck (was -2.5 in 2017); Repeat DEXA in 01/2020 showed T-2.5 at R fem neck. She was then changed from Boniva to Prolia injections, first injection 02/2020. Last injection was 03/13/2021. She denies any side effects. Due for injection  today. Vitamin D-OH level was 68.5 in 05/2020. She takes Calcium once daily. Taking a weight class at [employer] (6 week class), uses weights in the [employer] class. Uses machines at the gym.  She has chronic microscopic hematuria, under the care of Dr. McDiarmid.  She no longer takes prophylactic antibiotics for UTI's, and hasn't had any recurrent infections.   She is s/p Macroplastique procedure x 2, last of which was in  September 2016 and has had good results. She had worsening incontinence recently and has been doing PT at [place], which has been helping.  Doing home exercises now. Now only has leakage with sneezing, running. Sometimes when getting out of recliner.  Aortic atherosclerosis--compliant with statin     PMH, PSH, SH reviewed  Outpatient Encounter Medications as of 09/25/2021  Medication Sig Note   amLODipine (NORVASC) 5 MG tablet Take 1 tablet (5 mg total) by mouth every evening. (Patient taking differently: Take 2.5 mg by mouth every evening.)    aspirin EC 81 MG tablet Take 81 mg by mouth daily. Swallow whole.    Calcium Carbonate (CALCIUM 500 PO) Take 500 mg by mouth daily.    Cholecalciferol (VITAMIN D) 50 MCG (2000 UT) tablet Take 2,000 Units by mouth daily.    denosumab (PROLIA) 60 MG/ML SOSY injection Inject 60 mg into the skin every 6 (six) months.    losartan-hydrochlorothiazide (HYZAAR) 100-12.5 MG tablet Take 1 tablet by mouth daily.    Multiple Vitamins-Minerals (MULTIVITAMIN WITH MINERALS) tablet Take 1 tablet by mouth daily.    simvastatin (ZOCOR) 40 MG tablet TAKE ONE TABLET AT BEDTIME.    SYNTHROID 50 MCG tablet TAKE 1 TABLET DAILY BEFORE BREAKFAST FOR HYPOTHYROIDISM    vitamin C (ASCORBIC ACID) 500 MG tablet Take 500 mg by mouth daily.    acetaminophen (TYLENOL) 500 MG tablet Take 1,000 mg by mouth 3 (three) times daily as needed for moderate pain. (Patient not taking: Reported on 09/25/2021) 09/25/2021: As needed    [DISCONTINUED] nitrofurantoin (MACRODANTIN) 100 MG capsule Take 100 mg by mouth daily. (Patient not taking: Reported on 06/17/2021)    [EXPIRED] denosumab (PROLIA) injection 60 mg     No facility-administered encounter medications on file as of 09/25/2021.   Allergies  Allergen Reactions   Naprosyn [Naproxen] Rash    ROS:  No fever, chills, URI symptoms, chest pain, palpitations. No significant SOB/DOE. Slight occasional ankle swelling. No bleeding, bruising,  rash. No urinary complaints, leakage is improving. Moods are good. No further leg cramps or heel pain.   PHYSICAL EXAM:  BP 130/76    Pulse 72    Ht 5\' 4"  (1.626 m)    Wt 133 lb 12.8 oz (60.7 kg)    LMP  (LMP Unknown)    BMI 22.97 kg/m   Wt Readings from Last 3 Encounters:  09/25/21 133 lb 12.8 oz (60.7 kg)  06/17/21 132 lb (59.9 kg)  05/03/21 129 lb (58.5 kg)   Well developed, pleasant female in no distress HEENT: PERRL, EOMI, conjunctiva clear. Wearing mask due to COVID-19 pandemic. Neck: no lymphadenopathy or carotid bruit. Thyroid is borderline in size, no obvious nodule or asymmetry noted.  Heart: regular rate and rhythm, no murmur Lungs: clear bilaterally, good air movement. Back: no spinal or CVA tenderness Abdomen: soft, nontender, no organomegaly or mass Extremities: no edema, 2+ pulses Neuro: alert and oriented. Normal strength, gait Psych: normal mood, affect, hygiene and grooming Skin: no visible bruising or rashes  Lab Results  Component Value Date  HGBA1C 5.8 (A) 09/25/2021     ASSESSMENT/PLAN:  Pure hypercholesterolemia - due for recheck. Cont statin, low cholesterol diet.   - Plan: Lipid panel  Essential hypertension, benign - controlled. To double check on proper amlodipine dose with her cardiologist, and schedule f/u appt  Multinodular goiter - stable  Hypothyroidism, unspecified type - TSH at goal on last check, no thyroid symptoms currently.  Check next at CPE in August, sooner if sx - Plan: SYNTHROID 50 MCG tablet  IFG (impaired fasting glucose) - stable. Proper diet, exercise reviewed - Plan: HgB A1c, Glucose, random  Aortic atherosclerosis (HCC) - continue statin  Osteoporosis without current pathological fracture, unspecified osteoporosis type - Prolia given. Discussed Ca, D, weight-bearing exercise.  DEXA ordered, to do with next mammogram - Plan: denosumab (PROLIA) injection 60 mg, DG Bone Density  Hypothyroidism, unspecified type - TSH >4,  will try dose of 97mcg.  recheck TSH in 6 weeks - Plan: SYNTHROID 50 MCG tablet   Will return tomorrow for Lipids, glu  It is unclear whether you are to be taking 5mg  (full pill) vs half tablet of your amlodipine. The last prescription was written a year ago for the 5mg  tablets, so I'm guessing this is what you have and ae cutting in half. Check your blood pressure regularly. Goal is <130/80. Question is whether you're supposed to be taking the full tablet or half. You may need to ask your cardiologist, but having more blood pressure values on hand will help guide the recommendations.  I believe you may be due to see them for your yearly visit.  You should give them a call.

## 2021-09-25 ENCOUNTER — Encounter: Payer: Self-pay | Admitting: Family Medicine

## 2021-09-25 ENCOUNTER — Ambulatory Visit (INDEPENDENT_AMBULATORY_CARE_PROVIDER_SITE_OTHER): Payer: Medicare Other | Admitting: Family Medicine

## 2021-09-25 ENCOUNTER — Other Ambulatory Visit: Payer: Self-pay

## 2021-09-25 VITALS — BP 130/76 | HR 72 | Ht 64.0 in | Wt 133.8 lb

## 2021-09-25 DIAGNOSIS — E039 Hypothyroidism, unspecified: Secondary | ICD-10-CM | POA: Diagnosis not present

## 2021-09-25 DIAGNOSIS — R7301 Impaired fasting glucose: Secondary | ICD-10-CM | POA: Diagnosis not present

## 2021-09-25 DIAGNOSIS — E7800 Pure hypercholesterolemia, unspecified: Secondary | ICD-10-CM

## 2021-09-25 DIAGNOSIS — I10 Essential (primary) hypertension: Secondary | ICD-10-CM | POA: Diagnosis not present

## 2021-09-25 DIAGNOSIS — E042 Nontoxic multinodular goiter: Secondary | ICD-10-CM

## 2021-09-25 DIAGNOSIS — I700 Atherosclerosis of aorta: Secondary | ICD-10-CM

## 2021-09-25 DIAGNOSIS — M810 Age-related osteoporosis without current pathological fracture: Secondary | ICD-10-CM | POA: Diagnosis not present

## 2021-09-25 LAB — POCT GLYCOSYLATED HEMOGLOBIN (HGB A1C): Hemoglobin A1C: 5.8 % — AB (ref 4.0–5.6)

## 2021-09-25 MED ORDER — DENOSUMAB 60 MG/ML ~~LOC~~ SOSY
60.0000 mg | PREFILLED_SYRINGE | Freq: Once | SUBCUTANEOUS | Status: AC
  Administered 2021-09-25: 60 mg via SUBCUTANEOUS

## 2021-09-25 MED ORDER — SYNTHROID 50 MCG PO TABS: ORAL_TABLET | ORAL | 1 refills | Status: DC

## 2021-09-25 NOTE — Patient Instructions (Addendum)
Please schedule your bone density test and mammogram for the end of July.  It is unclear whether you are to be taking 5mg  (full pill) vs half tablet of your amlodipine. The last prescription was written a year ago for the 5mg  tablets, so I'm guessing this is what you have and ae cutting in half. Check your blood pressure regularly. Goal is <130/80. Question is whether you're supposed to be taking the full tablet or half. You may need to ask your cardiologist, but having more blood pressure values on hand will help guide the recommendations.  I believe you may be due to see them for your yearly visit.  You should give them a call.

## 2021-09-26 ENCOUNTER — Other Ambulatory Visit: Payer: Medicare Other

## 2021-09-26 DIAGNOSIS — R7301 Impaired fasting glucose: Secondary | ICD-10-CM | POA: Diagnosis not present

## 2021-09-26 DIAGNOSIS — E7800 Pure hypercholesterolemia, unspecified: Secondary | ICD-10-CM | POA: Diagnosis not present

## 2021-09-26 LAB — LIPID PANEL
Chol/HDL Ratio: 2.7 ratio (ref 0.0–4.4)
Cholesterol, Total: 122 mg/dL (ref 100–199)
HDL: 46 mg/dL (ref >39)
LDL Chol Calc (NIH): 50 mg/dL (ref 0–99)
Triglycerides: 150 mg/dL — ABNORMAL HIGH (ref 0–149)
VLDL Cholesterol Cal: 26 mg/dL (ref 5–40)

## 2021-09-26 LAB — GLUCOSE, RANDOM: Glucose: 115 mg/dL — ABNORMAL HIGH (ref 70–99)

## 2021-09-27 ENCOUNTER — Encounter: Payer: Self-pay | Admitting: Family Medicine

## 2021-09-27 MED ORDER — SIMVASTATIN 40 MG PO TABS: 40.0000 mg | ORAL_TABLET | Freq: Every day | ORAL | 3 refills | Status: DC

## 2021-09-29 ENCOUNTER — Encounter: Payer: Self-pay | Admitting: Family Medicine

## 2021-10-15 ENCOUNTER — Telehealth: Payer: Self-pay | Admitting: Pharmacist

## 2021-10-15 ENCOUNTER — Other Ambulatory Visit: Payer: Self-pay

## 2021-10-15 DIAGNOSIS — I10 Essential (primary) hypertension: Secondary | ICD-10-CM

## 2021-10-15 MED ORDER — LOSARTAN POTASSIUM-HCTZ 100-12.5 MG PO TABS: 1.0000 | ORAL_TABLET | Freq: Every day | ORAL | 0 refills | Status: DC

## 2021-10-15 NOTE — Progress Notes (Signed)
? ? ?Chronic Care Management ?Pharmacy Assistant  ? ?Name: Dorothy Rojas  MRN: [id number] DOB: March 27, 1946 ? ?Reason for Encounter: Chart prep for initial encounter with Dorothy Rojas Clinical Pharmacist on 10/17/21 ay 3 pm in office. ?  ?Conditions to be addressed/monitored: ?HTN, Allergic Rhinitis, Osteoporosis, Osteopenia, and Pure Hypecholesterolemia ? ?Recent office visits:  ?09/25/21 Dorothy Ohara, MD - Patient presented for Pure hypercholesterolemia and other concerns. Stopped Nitrofurantoin Macrocrystal. ? ?06/17/21 Dorothy Ohara, MD - Patient presented for Leg cramps and other concerns. No medication changes. ? ?05/03/21 Rojas, Dorothy Eng, PA-C - Patient presented for Cough and other concerns. No medication changes. ? ?Recent consult visits:  ?10/15/21 Dorothy Rojas (PT) - Patient presented for Stress incontinence and other concerns. No other visit details available. ? ?07/09/21 Dorothy Rojas (PT) - Patient presented for Stress incontinence and other concerns. No other visit details available. ? ?06/25/21 Dorothy Rojas (PT) - Patient presented for Stress incontinence and other concerns. No other visit details available. ? ?06/20/21 Dorothy Rojas (PT) - Patient presented for Stress incontinence and other concerns. No other visit details available. ? ?05/27/21 Dorothy Noa MD, Dorothy Rojas - Patient presented for Lumbar spondylolisthesis and other concerns. No medication changes. ? ?05/13/21 Dorothy Rojas (Orthopedic Surg) - Patient presented for Pain in right hip. No medication changes. ? ?04/23/21 Dorothy Hopes MD - Patient presented for Eye exam. No medication changes noted. ? ?Hospital visits:  ?None in previous 6 months ? ?Medications: ?Outpatient Encounter Medications as of 10/15/2021  ?Medication Sig Note  ? acetaminophen (TYLENOL) 500 MG tablet Take 1,000 mg by mouth 3 (three) times daily as needed for moderate pain. (Patient not taking: Reported on 09/25/2021) 09/25/2021: As needed ?  ? amLODipine (NORVASC) 5 MG  tablet Take 1 tablet (5 mg total) by mouth every evening. (Patient taking differently: Take 2.5 mg by mouth every evening.)   ? aspirin EC 81 MG tablet Take 81 mg by mouth daily. Swallow whole.   ? Calcium Carbonate (CALCIUM 500 PO) Take 500 mg by mouth daily.   ? Cholecalciferol (VITAMIN D) 50 MCG (2000 UT) tablet Take 2,000 Units by mouth daily.   ? denosumab (PROLIA) 60 MG/ML SOSY injection Inject 60 mg into the skin every 6 (six) months.   ? losartan-hydrochlorothiazide (HYZAAR) 100-12.5 MG tablet Take 1 tablet by mouth daily. PATIENT NEEDS TO KEEP APPOINTMENT FOR ADDITIONAL REFILLS   ? Multiple Vitamins-Minerals (MULTIVITAMIN WITH MINERALS) tablet Take 1 tablet by mouth daily.   ? simvastatin (ZOCOR) 40 MG tablet Take 1 tablet (40 mg total) by mouth at bedtime.   ? SYNTHROID 50 MCG tablet TAKE 1 TABLET DAILY BEFORE BREAKFAST FOR HYPOTHYROIDISM   ? vitamin C (ASCORBIC ACID) 500 MG tablet Take 500 mg by mouth daily.   ? ?No facility-administered encounter medications on file as of 10/15/2021.  ? ?Fill History Gaps : ?amlodipine 5 mg tablet 08/05/2021 90  ? ?losartan 100 mg-hydrochlorothiazide 12.5 mg tablet 07/10/2021 90  ? ?simvastatin 40 mg tablet 09/20/2021 30  ? ?Have you seen any other providers since your last visit? Patient reports she has seen her Neurosurgeon and Dr Dorothy Rojas whom manages her blood pressure medications. ? ?Any changes in your medications or health? Patient reports none ? ?Any side effects from any medications? Patient reports no ? ?Do you have an symptoms or problems not managed by your medications? Patent reports none. ? ?Any concerns about your health right now? Patient reports none ? ?Has your provider asked that you check blood pressure, blood  sugar, or follow special diet at home? Patient reports she does have a wrist machine and has not been great about checking will do a few prior to her appointment. ? ?Do you get any type of exercise on a regular basis? Patient reports she has recently  been helping her husband as he just had knee surgery, they previously attended [employer] and enjoyed the classes and were walking, she reports they have not got back to it yet. ? ?Can you think of a goal you would like to reach for your health? Patient reports she would like to live longer and stay healthy, she reports she is pre-diabetic and would like to know what things or measures she can take to stay well. ? ?Do you have any problems getting your medications? Patient reports she is happy with her current pharmacy and the cost of her medications. ? ?Is there anything that you would like to discuss during the appointment? Patient reports none  ? ?Patient aware to bring meds/ blood pressure machine to appointment. ? ?Care Gaps: ?BP- 110/60 ( 06/17/21) ?AWV- 8/22 ? ?Star Rating Drugs: ?Losartan HCTZ 100-12.5 mg - Last filled 07/10/21 90 DS at [facility] (Verified as accurate pt called for refill on 10/15/21) ?Simvastatin 40 mg - Last filled 09/20/21 30 DS at [facility] ? ? ? ?Dorothy Rojas CMA ?Clinical Pharmacist Assistant ?[phone] ? ?

## 2021-10-17 ENCOUNTER — Ambulatory Visit (INDEPENDENT_AMBULATORY_CARE_PROVIDER_SITE_OTHER): Payer: Medicare Other | Admitting: Pharmacist

## 2021-10-17 ENCOUNTER — Other Ambulatory Visit: Payer: Self-pay

## 2021-10-17 VITALS — BP 122/70

## 2021-10-17 DIAGNOSIS — E7800 Pure hypercholesterolemia, unspecified: Secondary | ICD-10-CM

## 2021-10-17 DIAGNOSIS — I10 Essential (primary) hypertension: Secondary | ICD-10-CM

## 2021-10-17 NOTE — Progress Notes (Signed)
Chronic Care Management Pharmacy Note  10/17/2021 Name:  Dorothy Rojas MRN:  [id number] DOB:  1946-03-31  Summary: TSH not optimal with osteoporosis LDL at goal < 70 BP at goal < 130/80  Recommendations/Changes made from today's visit: -Recommend switching simvastatin 40 mg to atorvastatin 20 mg to avoid drug interaction with amlodipine -Recommend repeat TSH and consider adjusting levothyroxine dose if not within target range of 2-4 (with osteoporosis) -Recommend repeat vitamin D level -Recommend moving multivitamins/supplements to the evening with simvastatin to avoid missing/simplify regimen -Recommended switching to calcium citrate  Plan: Follow up for med changes after discussion with PCP BP assessment in 2-3 months   Subjective: Dorothy Rojas is an 76 y.o. year old female who is a primary patient of Joselyn Arrow, MD.  The CCM team was consulted for assistance with disease management and care coordination needs.    Engaged with patient face to face for initial visit in response to provider referral for pharmacy case management and/or care coordination services.   Consent to Services:  The patient was given the following information about Chronic Care Management services today, agreed to services, and gave verbal consent: 1. CCM service includes personalized support from designated clinical staff supervised by the primary care provider, including individualized plan of care and coordination with other care providers 2. 24/7 contact phone numbers for assistance for urgent and routine care needs. 3. Service will only be billed when office clinical staff spend 20 minutes or more in a month to coordinate care. 4. Only one practitioner may furnish and bill the service in a calendar month. 5.The patient may stop CCM services at any time (effective at the end of the month) by phone call to the office staff. 6. The patient will be responsible for cost sharing (co-pay) of up to 20% of  the service fee (after annual deductible is met). Patient agreed to services and consent obtained.  Patient Care Team: Joselyn Arrow, MD as PCP - General (Family Medicine) Verner Chol, [facility] as Pharmacist (Pharmacist)  Recent office visits: 09/25/21 Joselyn Arrow, MD - Patient presented for Pure hypercholesterolemia and other concerns. Stopped Nitrofurantoin Macrocrystal.   06/17/21 Joselyn Arrow, MD - Patient presented for Leg cramps and other concerns. No medication changes.   05/03/21 [name], Kermit Balo, PA-C - Patient presented for Cough and other concerns. No medication changes.  Recent consult visits: 10/15/21 Vicente Masson (PT) - Patient presented for Stress incontinence and other concerns. No other visit details available.   07/09/21 Vicente Masson (PT) - Patient presented for Stress incontinence and other concerns. No other visit details available.   06/25/21 Vicente Masson (PT) - Patient presented for Stress incontinence and other concerns. No other visit details available.   06/20/21 Vanessa Barbara (PT) - Patient presented for Stress incontinence and other concerns. No other visit details available.   05/27/21 Cabbell MD, Ronaldo Miyamoto - Patient presented for Lumbar spondylolisthesis and other concerns. No medication changes.   05/13/21 Frederico Hamman (Orthopedic Surg) - Patient presented for Pain in right hip. No medication changes.   04/23/21 Christia Reading MD - Patient presented for Eye exam. No medication changes noted.  Hospital visits: None in previous 6 months   Objective:  Lab Results  Component Value Date   CREATININE 0.78 01/29/2021   BUN 20 01/29/2021   GFR 66.36 10/14/2019   GFRNONAA >60 01/29/2021   GFRAA 83 07/26/2020   NA 138 01/29/2021   K 4.5 01/29/2021   CALCIUM 9.7 01/29/2021  CO2 27 01/29/2021   GLUCOSE 115 (H) 09/26/2021    Lab Results  Component Value Date/Time   HGBA1C 5.8 (A) 09/25/2021 10:52 AM   HGBA1C 5.8 (H) 01/29/2021 09:30 AM   HGBA1C  5.8 (A) 07/26/2020 10:25 AM   HGBA1C 6.0 (H) 01/24/2020 08:28 AM   GFR 66.36 10/14/2019 08:44 AM   GFR 65.46 10/13/2019 10:30 AM    Last diabetic Eye exam: No results found for: HMDIABEYEEXA  Last diabetic Foot exam: No results found for: HMDIABFOOTEX   Lab Results  Component Value Date   CHOL 122 09/26/2021   HDL 46 09/26/2021   LDLCALC 50 09/26/2021   TRIG 150 (H) 09/26/2021   CHOLHDL 2.7 09/26/2021    Hepatic Function Latest Ref Rng & Units 07/26/2020 03/08/2020 01/24/2020  Total Protein 6.0 - 8.5 g/dL 7.0 6.9 7.0  Albumin 3.7 - 4.7 g/dL 4.7 4.5 4.5  AST 0 - 40 IU/L 26 24 78(H)  ALT 0 - 32 IU/L 29 25 58(H)  Alk Phosphatase 44 - 121 IU/L 45 50 49  Total Bilirubin 0.0 - 1.2 mg/dL 0.5 0.5 0.6  Bilirubin, Direct 0.00 - 0.40 mg/dL - 0.16 -    Lab Results  Component Value Date/Time   TSH 1.770 03/25/2021 10:24 AM   TSH 2.930 07/26/2020 11:11 AM   FREET4 0.86 12/21/2017 09:13 AM    CBC Latest Ref Rng & Units 03/25/2021 01/29/2021 01/24/2020  WBC 3.4 - 10.8 x10E3/uL 9.5 8.6 6.7  Hemoglobin 11.1 - 15.9 g/dL 12.3 12.7 12.6  Hematocrit 34.0 - 46.6 % 37.0 38.2 38.7  Platelets 150 - 450 x10E3/uL 253 228 230    Lab Results  Component Value Date/Time   VD25OH 68.5 05/23/2020 09:57 AM   VD25OH 34 05/22/2016 07:55 AM    Clinical ASCVD: Yes  The ASCVD Risk score (Arnett DK, et al., 2019) failed to calculate for the following reasons:   The valid total cholesterol range is 130 to 320 mg/dL    Depression screen [facility] 2/9 03/25/2021 12/05/2020 07/26/2020  Decreased Interest 0 0 0  Down, Depressed, Hopeless 0 0 0  PHQ - 2 Score 0 0 0  Some recent data might be hidden     Social History   Tobacco Use  Smoking Status Former   Packs/day: 1.00   Years: 21.00   Pack years: 21.00   Types: Cigarettes   Quit date: 08/12/1983   Years since quitting: 38.2  Smokeless Tobacco Never   BP Readings from Last 3 Encounters:  10/17/21 122/70  09/25/21 130/76  06/17/21 110/60   Pulse  Readings from Last 3 Encounters:  09/25/21 72  06/17/21 84  03/25/21 82   Wt Readings from Last 3 Encounters:  09/25/21 133 lb 12.8 oz (60.7 kg)  06/17/21 132 lb (59.9 kg)  05/03/21 129 lb (58.5 kg)   BMI Readings from Last 3 Encounters:  09/25/21 22.97 kg/m  06/17/21 22.48 kg/m  05/03/21 21.97 kg/m    Assessment/Interventions: Review of patient past medical history, allergies, medications, health status, including review of consultants reports, laboratory and other test data, was performed as part of comprehensive evaluation and provision of chronic care management services.   SDOH:  (Social Determinants of Health) assessments and interventions performed: Yes SDOH Interventions    Flowsheet Row Most Recent Value  SDOH Interventions   Financial Strain Interventions Intervention Not Indicated  Transportation Interventions Intervention Not Indicated      SDOH Screenings   Alcohol Screen: Not on file  Depression (CHE5-2): Low  Risk    PHQ-2 Score: 0  Financial Resource Strain: Low Risk    Difficulty of Paying Living Expenses: Not hard at all  Food Insecurity: Not on file  Housing: Not on file  Physical Activity: Not on file  Social Connections: Not on file  Stress: Not on file  Tobacco Use: Medium Risk   Smoking Tobacco Use: Former   Smokeless Tobacco Use: Never   Passive Exposure: Not on file  Transportation Needs: No Transportation Needs   Lack of Transportation (Medical): No   Lack of Transportation (Non-Medical): No   Patient is retired but the last couple of weeks have been different than her usual as her husband had a knee replacement so she is caring for him. Prior to that, they were going to [employer] 3-5 days a week to exercise including classes and using equipment. Her schedule was usually Mon and Wed workout in gym and Tues and Fri is a class and then Thursday she does something lighter like walking. She also enjoys reading regularly.  Patient doesn't have  any problem going to sleep but she does wake up 3-4 am only when she has something on her mind and has a hard time going back to sleep. 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 She has less stress right now so it is not happening as often. 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 She still gets about 6 hours of sleep on those nights and doesn't feel too tired. She tried Benadryl in the past but that made her groggy the next day. She doesn't take naps.  Patient denies any problems with her medications and thinks she knows what everything is for. She uses a pill box and fills it monthly and very seldom misses doses.   CCM Care Plan  Allergies  Allergen Reactions   Naprosyn [Naproxen] Rash    Medications Reviewed Today     Reviewed by Verner Chol, [facility] (Pharmacist) on 10/17/21 at 1530  Med List Status: <None>   Medication Order Taking? Sig Documenting Provider Last Dose Status Informant  acetaminophen (TYLENOL) 500 MG tablet [id number] Yes Take 1,000 mg by mouth 3 (three) times daily as needed for moderate pain. [provider] Taking Active Self           Med Note Katrinka Blazing, Corliss Blacker Sep 25, 2021 10:45 AM) As needed   amLODipine (NORVASC) 5 MG tablet [id number] Yes Take 1 tablet (5 mg total) by mouth every evening.  Patient taking differently: Take 2.5 mg by mouth every evening.   Cantwell, Celeste C, PA-C Taking Active   aspirin EC 81 MG tablet [id number] Yes Take 81 mg by mouth daily. Swallow whole. [provider] Taking Active Self  Calcium Carbonate (CALCIUM 500 PO) [id number] Yes Take 500 mg by mouth daily. [provider] Taking Active   Cholecalciferol (VITAMIN D) 50 MCG (2000 UT) tablet [id number] Yes Take 2,000 Units by mouth daily. [provider] Taking Active Self  denosumab (PROLIA)  60 MG/ML SOSY injection [id number] Yes Inject 60 mg into the skin every 6 (six) months. [provider] Taking Active Self  losartan-hydrochlorothiazide (HYZAAR) 100-12.5 MG tablet [id number] Yes Take 1 tablet by mouth daily. PATIENT NEEDS TO KEEP APPOINTMENT FOR ADDITIONAL REFILLS Yates Decamp, MD Taking Active  Multiple Vitamins-Minerals (MULTIVITAMIN WITH MINERALS) tablet [id number] Yes Take 1 tablet by mouth daily. [provider] Taking Active Self  simvastatin (ZOCOR) 40 MG tablet [id number] Yes Take 1 tablet (40 mg total) by mouth at bedtime. [name], MD Taking Active   SYNTHROID 50 MCG tablet [id number] Yes TAKE 1 TABLET DAILY BEFORE BREAKFAST FOR HYPOTHYROIDISM [name], MD Taking Active   vitamin C (ASCORBIC ACID) 500 MG tablet [id number] Yes Take 500 mg by mouth daily. [provider] Taking Active Self            Patient Active Problem List   Diagnosis Date Noted   Lumbar stenosis with neurogenic claudication 01/31/2021   Aortic atherosclerosis ([place]) 07/30/2020   Dyspnea 12/27/2019   Hoarseness 12/27/2019   Bilateral hearing loss 05/03/2019   Seasonal allergic rhinitis 05/03/2019   Tinnitus of right ear 05/03/2019   S/P left TKA 09/21/2017   S/P total knee replacement 09/21/2017   Abnormal cardiovascular stress test 04/23/2016   Osteoporosis 12/13/2015   Tubular adenoma of colon 06/26/2015   Esophageal reflux 11/01/2014   Impaired fasting glucose 11/01/2014   CN (constipation) 11/01/2014   Advance care planning 11/01/2014   Chest pain with moderate risk for cardiac etiology 05/04/2014   Heterozygous factor V Leiden mutation ([place]) 08/08/2013   OAB (overactive bladder) 04/14/2013   Osteopenia 04/14/2013   Multinodular goiter 10/13/2012   Carotid stenosis 10/13/2012   Essential hypertension, benign 04/09/2011   Pure hypercholesterolemia 04/09/2011    Immunization History  Administered Date(s) Administered   Fluad Quad(high Dose 65+)  04/12/2019   Influenza Whole 04/09/2011   Influenza, High Dose Seasonal PF 05/08/2014, 05/01/2015, 05/26/2016, 05/22/2017, 06/23/2018, 04/16/2021   Influenza,inj,Quad PF,6+ Mos 04/14/2013   Influenza-Unspecified 05/22/2017, 05/04/2020, 04/16/2021   PFIZER(Purple Top)SARS-COV-2 Vaccination 09/15/2019, 10/10/2019, 05/05/2020, 11/17/2020   Pfizer Covid-19 Vaccine Bivalent Booster 3yrs & up 07/08/2021   Pneumococcal Conjugate-13 11/01/2014, 05/11/2018   Pneumococcal Polysaccharide-23 04/09/2011, 03/25/2021   Td 08/07/2006   Tdap 11/01/2014   Zoster Recombinat (Shingrix) 07/28/2019, 11/03/2019   Zoster, Live 10/25/2008    Conditions to be addressed/monitored:  Hypertension, Hyperlipidemia, Hypothyroidism, and Osteoporosis  Care Plan : [place]  Updates made by Viona Gilmore, [place] since 10/17/2021 12:00 AM     Problem: Problem: Hypertension, Hyperlipidemia, Hypothyroidism, and Osteoporosis      Long-Range Goal: Patient-Specific Goal   Start Date: 10/17/2021  Expected End Date: 10/18/2022  This Visit's Progress: On track  Priority: High  Note:   Current Barriers:  Unable to independently monitor therapeutic efficacy Suboptimal therapeutic regimen for cholesterol  Pharmacist Clinical Goal(s):  Patient will achieve adherence to monitoring guidelines and medication adherence to achieve therapeutic efficacy through collaboration with PharmD and provider.   Interventions: 1:1 collaboration with [name], MD regarding development and update of comprehensive plan of care as evidenced by provider attestation and co-signature Inter-disciplinary care team collaboration (see longitudinal plan of care) Comprehensive medication review performed; medication list updated in electronic medical record  Hypertension (BP goal <130/80) -Controlled -Current treatment: Amlodipine 5 mg 1/2 tablet daily - PM - Appropriate, Effective, Safe, Accessible Losartan-HCTZ 100-12.5 mg 1 tablet  daily  - in AM - Appropriate, Effective, Safe, Accessible -Medications previously tried: none  -Current home readings: 111/57 $RemoveBeforeDE'@115'qxeFmnInecstgld$  pm, 115/64 $RemoveBe'@3'bIRtFuWGc$ , 121/64 $Remove'@7am'rUMTIWN$ , 122/61 $Remove'@8am'mTPMLdq$   (wrist cuff); owns an arm cuff -Current dietary habits: not salting foods and not cooking with food; eating take out like 1-2 times a week (restaurant food); does look at sugar and sodium content  on package labels -Current exercise habits: exercising regularly (3-5 days a week) -Denies hypotensive/hypertensive symptoms -Educated on BP goals and benefits of medications for prevention of heart attack, stroke and kidney damage; Importance of home blood pressure monitoring; Proper BP monitoring technique; Symptoms of hypotension and importance of maintaining adequate hydration; -Counseled to monitor BP at home weekly, document, and provide log at future appointments -Counseled on diet and exercise extensively Recommended to continue current medication Recommended checking BP with arm cuff at home and comparing to wrist cuff readings.  Hyperlipidemia/aortic atherosclerosis: (LDL goal < 70) -Controlled -Current treatment: Simvastatin 40 mg 1 tablet daily - in PM - Appropriate, Effective, Query Safe, Accessible Aspirin 81 mg 1 tablet daily - Appropriate, Effective, Safe, Accessible -Medications previously tried: none  -Current dietary patterns: tries not to eat much fried foods; does have desserts some -Current exercise habits: exercising regularly at [employer] -Educated on Cholesterol goals;  Benefits of statin for ASCVD risk reduction; Importance of limiting foods high in cholesterol; Exercise goal of 150 minutes per week; -Counseled on diet and exercise extensively Recommended switching simvastatin to atorvastatin 20 mg to avoid drug interaction with amlodipine.  Pre-diabetes (A1c goal <6.5%) -Controlled -Current medications: No medications -Medications previously tried: none  -Current home glucose readings fasting  glucose: does not need to check post prandial glucose: does not need to check -Denies hypoglycemic/hyperglycemic symptoms -Current meal patterns:  breakfast: n/a  lunch: n/a  dinner: n/a snacks: n/a drinks: n/a -Current exercise: exercising regularly -Educated on A1c and blood sugar goals; Carbohydrate counting and/or plate method -Counseled to check feet daily and get yearly eye exams -Counseled on diet and exercise extensively Will mail healthy plate handout.  Osteoporosis  (Goal prevent fractures) -Controlled -Last DEXA Scan: 01/2020   T-Score femoral neck: -2.5  T-Score total hip: n/a  T-Score lumbar spine: -1.5  T-Score forearm radius: n/a  10-year probability of major osteoporotic fracture: n/a  10-year probability of hip fracture: n/a -Patient is a candidate for pharmacologic treatment due to T-Score < -2.5 in femoral neck -Current treatment  Prolia injections every 6 months (last 09/25/21; started 02/2020) Vitamin D 2000 units daily - Appropriate, Effective, Safe, Accessible Calcium carbonate 500 mg 1 tablet daily - Appropriate, Effective, Safe, Accessible Multivitamin 1 tablet daily (1000 units vitamin D, 300 mg of calcium) - Appropriate, Effective, Safe, Accessible -Medications previously tried:  Boniva (ineffective)  -Recommend [phone] units of vitamin D daily. Recommend 1200 mg of calcium daily from dietary and supplemental sources. Recommend weight-bearing and muscle strengthening exercises for building and maintaining bone density. -Recommended switching to calcium citrate for better absorption. -Recommend repeat vitamin D level   Hypothryoidism (Goal: TSH 2-4) -Not ideally controlled -Current treatment  Synthroid 50 mcg 1 tablet daily before breakfast  - Appropriate, Effective, Query Safe, Accessible -Medications previously tried: none  -Recommended repeat TSH and consider adjusting levothyroxine dose for stricter TSH target with osteoporosis.  Health  Maintenance -Vaccine gaps:  none -Current therapy:  Acetaminophen 500 mg 1 tablet as needed (she takes max 4 tablets) Multivitamin 1 tablet daily Vitamin C 500 mg 1 tablet daily as needed  -Educated on Cost vs benefit of each product must be carefully weighed by individual consumer -Patient is satisfied with current therapy and denies issues -Counseled on maximum of 6 tablets of Tylenol 500 mg per day (3,000 mg max per day)  Patient Goals/Self-Care Activities Patient will:  - take medications as prescribed as evidenced by patient report and record review check blood pressure weekly, document,  and provide at future appointments target a minimum of 150 minutes of moderate intensity exercise weekly  Follow Up Plan: The care management team will reach out to the patient again over the next 14 days.        Medication Assistance: None required.  Patient affirms current coverage meets needs.  Compliance/Adherence/Medication fill history: Care Gaps: BP- 110/60 ( 06/17/21)  Star-Rating Drugs: Losartan HCTZ 100-12.5 mg - Last filled 07/10/21 90 DS at [facility] (Verified as accurate pt called for refill on 10/15/21) Simvastatin 40 mg - Last filled 09/20/21 30 DS at [facility]  Patient's preferred pharmacy is:  [place], [place] [address] Phone: [phone] Fax: [phone]  [facility] - [place], [place] - [address] [place] [address] Phone: [phone] Fax: [phone]  Uses pill box? Yes - month at a time Pt endorses 99% compliance  We discussed: Current pharmacy is preferred with insurance plan and patient is satisfied with pharmacy services Patient decided to: Continue current medication management strategy  Care Plan and Follow Up Patient Decision:  Patient agrees to Care Plan and Follow-up.  Plan: The care management team will reach out to the  patient again over the next 14 days.  [name], PharmD, [facility] [phone]

## 2021-10-17 NOTE — Patient Instructions (Signed)
Hi Dorothy Rojas,  It was great to get to meet you in person! Below is a summary of some of the topics we discussed.   Don't forget to: Keep checking your blood pressure at home and aim to do so about once a week to make sure your medications are working Try Melatonin 2 or 3 mg (timed release) for more stressful nights Look for calcium citrate next time you buy calcium Aim for about 30 grams of carbs per meal and aim for 15 grams of carbs per meal Think about moving your supplements to the evening with your simvastatin to simplify your regimen and avoid missing them   Please reach out to me if you have any questions or need anything !  Best, Maddie  [name], PharmD, [facility] [phone] Visit Information   Goals Addressed             This Visit's Progress    Track and Manage My Blood Pressure-Hypertension       Timeframe:  Long-Range Goal Priority:  Medium Start Date:                             Expected End Date:                       Follow Up Date 12/17/21    - check blood pressure weekly - choose a place to take my blood pressure (home, clinic or office, retail store) - write blood pressure results in a log or diary    Why is this important?   You won't feel high blood pressure, but it can still hurt your blood vessels.  High blood pressure can cause heart or kidney problems. It can also cause a stroke.  Making lifestyle changes like losing a little weight or eating less salt will help.  Checking your blood pressure at home and at different times of the day can help to control blood pressure.  If the doctor prescribes medicine remember to take it the way the doctor ordered.  Call the office if you cannot afford the medicine or if there are questions about it.     Notes:        Patient Care Plan: CCM Pharmacy Care Plan     Problem Identified: Problem: Hypertension, Hyperlipidemia, Hypothyroidism, and Osteoporosis       Long-Range Goal: Patient-Specific Goal   Start Date: 10/17/2021  Expected End Date: 10/18/2022  This Visit's Progress: On track  Priority: High  Note:   Current Barriers:  Unable to independently monitor therapeutic efficacy Suboptimal therapeutic regimen for cholesterol  Pharmacist Clinical Goal(s):  Patient will achieve adherence to monitoring guidelines and medication adherence to achieve therapeutic efficacy through collaboration with PharmD and provider.   Interventions: 1:1 collaboration with [name], MD regarding development and update of comprehensive plan of care as evidenced by provider attestation and co-signature Inter-disciplinary care team collaboration (see longitudinal plan of care) Comprehensive medication review performed; medication list updated in electronic medical record  Hypertension (BP goal <130/80) -Controlled -Current treatment: Amlodipine 5 mg 1/2 tablet daily - PM - Appropriate, Effective, Safe, Accessible Losartan-HCTZ 100-12.5 mg 1 tablet daily  - in AM - Appropriate, Effective, Safe, Accessible -Medications previously tried: none  -Current home readings: 111/57 '@115'$  pm, 115/64 '@3'$ , 121/64 '@7am'$ , 122/61 '@8am'$   (wrist cuff); owns an arm cuff -Current dietary habits: not salting foods and not cooking with food;  eating take out like 1-2 times a week (restaurant food); does look at sugar and sodium content on package labels -Current exercise habits: exercising regularly (3-5 days a week) -Denies hypotensive/hypertensive symptoms -Educated on BP goals and benefits of medications for prevention of heart attack, stroke and kidney damage; Importance of home blood pressure monitoring; Proper BP monitoring technique; Symptoms of hypotension and importance of maintaining adequate hydration; -Counseled to monitor BP at home weekly, document, and provide log at future appointments -Counseled on diet and exercise extensively Recommended to continue current  medication Recommended checking BP with arm cuff at home and comparing to wrist cuff readings.  Hyperlipidemia/aortic atherosclerosis: (LDL goal < 70) -Controlled -Current treatment: Simvastatin 40 mg 1 tablet daily - in PM - Appropriate, Effective, Query Safe, Accessible Aspirin 81 mg 1 tablet daily - Appropriate, Effective, Safe, Accessible -Medications previously tried: none  -Current dietary patterns: tries not to eat much fried foods; does have desserts some -Current exercise habits: exercising regularly at [employer] -Educated on Cholesterol goals;  Benefits of statin for ASCVD risk reduction; Importance of limiting foods high in cholesterol; Exercise goal of 150 minutes per week; -Counseled on diet and exercise extensively Recommended switching simvastatin to atorvastatin 20 mg to avoid drug interaction with amlodipine.  Pre-diabetes (A1c goal <6.5%) -Controlled -Current medications: No medications -Medications previously tried: none  -Current home glucose readings fasting glucose: does not need to check post prandial glucose: does not need to check -Denies hypoglycemic/hyperglycemic symptoms -Current meal patterns:  breakfast: n/a  lunch: n/a  dinner: n/a snacks: n/a drinks: n/a -Current exercise: exercising regularly -Educated on A1c and blood sugar goals; Carbohydrate counting and/or plate method -Counseled to check feet daily and get yearly eye exams -Counseled on diet and exercise extensively Will mail healthy plate handout.  Osteoporosis  (Goal prevent fractures) -Controlled -Last DEXA Scan: 01/2020   T-Score femoral neck: -2.5  T-Score total hip: n/a  T-Score lumbar spine: -1.5  T-Score forearm radius: n/a  10-year probability of major osteoporotic fracture: n/a  10-year probability of hip fracture: n/a -Patient is a candidate for pharmacologic treatment due to T-Score < -2.5 in femoral neck -Current treatment  Prolia injections every 6 months (last  09/25/21; started 02/2020) Vitamin D 2000 units daily - Appropriate, Effective, Safe, Accessible Calcium carbonate 500 mg 1 tablet daily - Appropriate, Effective, Safe, Accessible Multivitamin 1 tablet daily (1000 units vitamin D, 300 mg of calcium) - Appropriate, Effective, Safe, Accessible -Medications previously tried:  Boniva (ineffective)  -Recommend [phone] units of vitamin D daily. Recommend 1200 mg of calcium daily from dietary and supplemental sources. Recommend weight-bearing and muscle strengthening exercises for building and maintaining bone density. -Recommended switching to calcium citrate for better absorption. -Recommend repeat vitamin D level   Hypothryoidism (Goal: TSH 2-4) -Not ideally controlled -Current treatment  Synthroid 50 mcg 1 tablet daily before breakfast  - Appropriate, Effective, Query Safe, Accessible -Medications previously tried: none  -Recommended repeat TSH and consider adjusting levothyroxine dose for stricter TSH target with osteoporosis.  Health Maintenance -Vaccine gaps:  none -Current therapy:  Acetaminophen 500 mg 1 tablet as needed (she takes max 4 tablets) Multivitamin 1 tablet daily Vitamin C 500 mg 1 tablet daily as needed  -Educated on Cost vs benefit of each product must be carefully weighed by individual consumer -Patient is satisfied with current therapy and denies issues -Counseled on maximum of 6 tablets of Tylenol 500 mg per day (3,000 mg max per day)  Patient Goals/Self-Care Activities Patient will:  -  take medications as prescribed as evidenced by patient report and record review check blood pressure weekly, document, and provide at future appointments target a minimum of 150 minutes of moderate intensity exercise weekly  Follow Up Plan: The care management team will reach out to the patient again over the next 14 days.       Ms. Roswell was given information about Chronic Care Management services today including:  CCM service  includes personalized support from designated clinical staff supervised by her physician, including individualized plan of care and coordination with other care providers 24/7 contact phone numbers for assistance for urgent and routine care needs. Standard insurance, coinsurance, copays and deductibles apply for chronic care management only during months in which we provide at least 20 minutes of these services. Most insurances cover these services at 100%, however patients may be responsible for any copay, coinsurance and/or deductible if applicable. This service may help you avoid the need for more expensive face-to-face services. Only one practitioner may furnish and bill the service in a calendar month. The patient may stop CCM services at any time (effective at the end of the month) by phone call to the office staff.  Patient agreed to services and verbal consent obtained.   Patient verbalizes understanding of instructions and care plan provided today and agrees to view in [place]. Active MyChart status confirmed with patient.   The pharmacy team will reach out to the patient again over the next 7 days.   Viona Gilmore, [facility]

## 2021-10-23 NOTE — Progress Notes (Signed)
? ?Primary Physician/Referring:  [name], MD ? ?Patient ID: Dorothy Rojas, female    DOB: 1946-06-01, 76 y.o.   MRN: [id number] ? ?Chief Complaint  ?Patient presents with  ? Hypertension  ? Follow-up  ?  1 year  ? ?HPI:   ? ?Dorothy Rojas  is a 76 y.o. Caucasian female with chronic dyspnea on exertion, prior history of tobacco use disorder approximately 15 to 20 pack year history quit in 1986, hypertension, hyperlipidemia, hyperglycemia and a family history of premature coronary artery disease, history of DVT and factor V Leyden positive in 2014. Pulmonary evaluation has not reveled etiology of her symptoms, CT scan negative for PE in 2021. Patient with normal nuclear stress test 01/25/2020. ? ?Patient presents for 76 year follow-up of hypertension and hyperlipidemia.  Both cardiac and pulmonary evaluations have been unyielding for etiology of patient's chronic and relatively stable dyspnea, therefore suspected to be related to deconditioning.  Patient has not been exercising 3 to 5 days/week doing both cardio exercises and strength training.  Dyspnea has significantly improved over the last few months.  I personally reviewed external labs, lipids are well controlled.  Patient is currently taking amlodipine 2.5 mg daily and she has no leg swelling.  Denies chest pain. ? ?Past Medical History:  ?Diagnosis Date  ? Allergy   ? Anemia   ? Arthritis   ? knees  ? Cataract   ? removed both eyes   ? Chronic constipation   ? Clotting disorder ([place])   ? DVT 2014  ? Colon polyp 06/2015  ? tubular adenoma (sigmoid)  ? Diverticulosis   ? seen on colonoscopy 06/2015  ? GERD (gastroesophageal reflux disease)   ? normal EGD 06/2015  ? Heart murmur   ? as a child   ? Heterozygous factor V Leiden mutation [facility]) dx 07/2013    ? hematologist--  dr Bernadene Bell ([facility])--  per note "Low Risk"  ? History of DVT of lower extremity   ? dx 07-28-2013  left lower extrem.  --  xarelto for 6 months  ? Hyperlipidemia   ?  Hypertension   ? Hypothyroidism   ? Low back pain 06/2020  ? MRI per ortho--mild-mod L4-5 spinal stenosis, mod-severe R foraminal stenosis, medial diasplacement of R L5. Mod-severe facet arthrosis L4-5, L5-S1  ? Lymphadenopathy, inguinal   ? bilateral  ? Microscopic hematuria   ? chronic   ? Multinodular goiter   ? Multinodular thyroid   ? a. Path benign 2013.  ? OAB (overactive bladder)   ? Osteoporosis 2017  ? osteopenia since 2011; osteoporosis 12/2015  ? Pre-diabetes   ? no meds, managaed with lifestyle changes   ? Prediabetes   ? SUI (stress urinary incontinence, female)   ? Tricuspid regurgitation   ? mild-mod, noted on echo 03/2016  ? ?Past Surgical History:  ?Procedure Laterality Date  ? APPENDECTOMY  1973  ? BACK SURGERY  01/2021  ? CATARACT EXTRACTION W/ INTRAOCULAR LENS  IMPLANT, BILATERAL Bilateral 2015  ? COLONOSCOPY    ? CYSTOCELE REPAIR  12/03/2010  ? CYSTOSCOPY MACROPLASTIQUE IMPLANT N/A 12/12/2014  ? Procedure: CYSTOSCOPY MACROPLASTIQUE INJECTION;  Surgeon: Bjorn Loser, MD;  Location: [facility];  Service: Urology;  Laterality: N/A;  ? CYSTOSCOPY MACROPLASTIQUE IMPLANT N/A 04/12/2015  ? Procedure: CYSTOSCOPY MACROPLASTIQUE IMPLANT;  Surgeon: Bjorn Loser, MD;  Location: [facility];  Service: Urology;  Laterality: N/A;  ? INCONTINENCE SURGERY  04/1999  ? Re-do at [facility]  for sling complications in 2130  ? KNEE ARTHROSCOPY Right 04/05/2013  ? LATERAL EPICONDYLE RELEASE Right   ? NEUROMA SURGERY Bilateral 1980's  ?  feet  ? OVARIAN CYST REMOVAL Left 1973  ? POLYPECTOMY    ? POSTERIOR LUMBAR FUSION  01/31/2021  ? L4-5, Dr. Cyndy Freeze  ? PUBOVAGINAL SLING N/A 07/30/2015  ? Procedure: PELVIC EXAM UNDER ANESTHESIA, TRANSVAGINAL BIOPSY OF PERIURETHRAL MASS;  Surgeon: Raynelle Bring, MD;  Location: [facility];  Service: Urology;  Laterality: N/A;  4 MINS ?  ? TONSILLECTOMY  as child  ? TOTAL ABDOMINAL HYSTERECTOMY W/ BILATERAL SALPINGOOPHORECTOMY  04/1999  ? TOTAL KNEE  ARTHROPLASTY Left 09/21/2017  ? Procedure: LEFT TOTAL KNEE ARTHROPLASTY;  Surgeon: Paralee Cancel, MD;  Location: [facility];  Service: Orthopedics;  Laterality: Left;  90 mins  ? TRANSTHORACIC ECHOCARDIOGRAM  05/04/2014  ? normal ,  ef 60-65%  ? UPPER GASTROINTESTINAL ENDOSCOPY    ? ?Family History  ?Problem Relation Age of Onset  ? Hypertension Father   ? Heart disease Father 68  ?     CABG, died from MI at 19  ? COPD Father   ? Cirrhosis Mother   ?     related to psoriasis medications  ? Diabetes Mother   ? Psoriasis Mother   ? Osteogenesis imperfecta Sister   ? Heart disease Sister   ?     CHF  ? Diabetes Brother   ? Heart disease Brother 43  ?     MI  ? Deep vein thrombosis Brother   ?     factor V Leiden NEGATIVE  ? Cancer Brother 50  ?     bile duct and liver  ? Stomach cancer Brother   ? Colon cancer Neg Hx   ? Colon polyps Neg Hx   ? Esophageal cancer Neg Hx   ? Rectal cancer Neg Hx   ? ? ?Social History  ? ?Tobacco Use  ? Smoking status: Former  ?  Packs/day: 1.00  ?  Years: 21.00  ?  Pack years: 21.00  ?  Types: Cigarettes  ?  Quit date: 08/12/1983  ?  Years since quitting: 38.2  ? Smokeless tobacco: Never  ?Substance Use Topics  ? Alcohol use: Yes  ?  Alcohol/week: 0.0 standard drinks  ?  Comment: 1 glass of wine most days of the week (5oz)  ? ?Marital Status: Married ?ROS  ?Review of Systems  ?Constitutional: Negative for malaise/fatigue and weight gain.  ?Cardiovascular:  Positive for dyspnea on exertion (improved). Negative for chest pain, claudication, leg swelling, near-syncope, orthopnea, palpitations, paroxysmal nocturnal dyspnea and syncope.  ?Respiratory:  Negative for shortness of breath.   ?Hematologic/Lymphatic: Does not bruise/bleed easily.  ?Musculoskeletal:  Positive for arthritis (knee).  ?Gastrointestinal:  Negative for hematochezia and melena.  ?Neurological:  Negative for dizziness and weakness.  ?Objective  ?Blood pressure (!) 120/51, pulse 77, temperature 97.7 ?F (36.5 ?C), resp. rate 16,  height '5\' 4"'$  (1.626 m), weight 131 lb (59.4 kg), SpO2 98 %.  ?Vitals with BMI 10/24/2021 10/17/2021 09/25/2021  ?Height '5\' 4"'$  - '5\' 4"'$   ?Weight 131 lbs - 133 lbs 13 oz  ?BMI 22.47 - 22.96  ?Systolic 865 784 696  ?Diastolic 51 70 76  ?Pulse 77 - 72  ?  ? Physical Exam ?Vitals reviewed.  ?Neck:  ?   Thyroid: No thyromegaly.  ?Cardiovascular:  ?   Rate and Rhythm: Normal rate and regular rhythm.  ?   Pulses: Intact distal pulses.  ?  Heart sounds: Normal heart sounds, S1 normal and S2 normal. No murmur heard. ?  No gallop.  ?   Comments: No leg edema, no JVD. ?Pulmonary:  ?   Effort: Pulmonary effort is normal. No respiratory distress.  ?   Breath sounds: Normal breath sounds. No wheezing, rhonchi or rales.  ?Musculoskeletal:  ?   Cervical back: Neck supple.  ?   Right lower leg: No edema.  ?   Left lower leg: No edema.  ?Neurological:  ?   Mental Status: She is alert.  ? ?Laboratory examination:  ? ?Recent Labs  ?  01/29/21 ?0930 09/26/21 ?7989  ?NA 138  --   ?K 4.5  --   ?CL 103  --   ?CO2 27  --   ?GLUCOSE 93 115*  ?BUN 20  --   ?CREATININE 0.78  --   ?CALCIUM 9.7  --   ?GFRNONAA >60  --   ? ?CrCl cannot be calculated (Patient's most recent lab result is older than the maximum 21 days allowed.).  ?CMP Latest Ref Rng & Units 09/26/2021 01/29/2021 07/26/2020  ?Glucose 70 - 99 mg/dL 115(H) 93 96  ?BUN 8 - 23 mg/dL - 20 15  ?Creatinine 0.44 - 1.00 mg/dL - 0.78 0.81  ?Sodium 135 - 145 mmol/L - 138 139  ?Potassium 3.5 - 5.1 mmol/L - 4.5 4.7  ?Chloride 98 - 111 mmol/L - 103 101  ?CO2 22 - 32 mmol/L - 27 25  ?Calcium 8.9 - 10.3 mg/dL - 9.7 9.6  ?Total Protein 6.0 - 8.5 g/dL - - 7.0  ?Total Bilirubin 0.0 - 1.2 mg/dL - - 0.5  ?Alkaline Phos 44 - 121 IU/L - - 45  ?AST 0 - 40 IU/L - - 26  ?ALT 0 - 32 IU/L - - 29  ? ?CBC Latest Ref Rng & Units 03/25/2021 01/29/2021 01/24/2020  ?WBC 3.4 - 10.8 x10E3/uL 9.5 8.6 6.7  ?Hemoglobin 11.1 - 15.9 g/dL 12.3 12.7 12.6  ?Hematocrit 34.0 - 46.6 % 37.0 38.2 38.7  ?Platelets 150 - 450 x10E3/uL 253 228  230  ? ?Lipid Panel  ?   ?Component Value Date/Time  ? CHOL 122 09/26/2021 0857  ? TRIG 150 (H) 09/26/2021 0857  ? HDL 46 09/26/2021 0857  ? CHOLHDL 2.7 09/26/2021 0857  ? CHOLHDL 3.0 06/11/2017 0851  ? VLDL 37 (H)

## 2021-10-24 ENCOUNTER — Other Ambulatory Visit: Payer: Self-pay

## 2021-10-24 ENCOUNTER — Encounter: Payer: Self-pay | Admitting: Student

## 2021-10-24 ENCOUNTER — Ambulatory Visit: Payer: Medicare Other | Admitting: Student

## 2021-10-24 VITALS — BP 120/51 | HR 77 | Temp 97.7°F | Resp 16 | Ht 64.0 in | Wt 131.0 lb

## 2021-10-24 DIAGNOSIS — I10 Essential (primary) hypertension: Secondary | ICD-10-CM | POA: Diagnosis not present

## 2021-10-24 DIAGNOSIS — Z8249 Family history of ischemic heart disease and other diseases of the circulatory system: Secondary | ICD-10-CM | POA: Diagnosis not present

## 2021-10-24 DIAGNOSIS — E7800 Pure hypercholesterolemia, unspecified: Secondary | ICD-10-CM

## 2021-10-24 MED ORDER — AMLODIPINE BESYLATE 2.5 MG PO TABS: 2.5000 mg | ORAL_TABLET | Freq: Every evening | ORAL | 3 refills | Status: DC

## 2021-11-08 DIAGNOSIS — E7800 Pure hypercholesterolemia, unspecified: Secondary | ICD-10-CM

## 2021-11-08 DIAGNOSIS — I10 Essential (primary) hypertension: Secondary | ICD-10-CM

## 2021-11-12 ENCOUNTER — Other Ambulatory Visit: Payer: Self-pay | Admitting: Cardiology

## 2021-11-12 DIAGNOSIS — I10 Essential (primary) hypertension: Secondary | ICD-10-CM

## 2021-12-03 ENCOUNTER — Other Ambulatory Visit: Payer: Self-pay | Admitting: Orthopedic Surgery

## 2021-12-03 ENCOUNTER — Other Ambulatory Visit (HOSPITAL_COMMUNITY): Payer: Self-pay | Admitting: Orthopedic Surgery

## 2021-12-03 DIAGNOSIS — M25562 Pain in left knee: Secondary | ICD-10-CM | POA: Diagnosis not present

## 2021-12-03 DIAGNOSIS — T84033A Mechanical loosening of internal left knee prosthetic joint, initial encounter: Secondary | ICD-10-CM

## 2021-12-17 ENCOUNTER — Encounter (HOSPITAL_COMMUNITY)
Admission: RE | Admit: 2021-12-17 | Discharge: 2021-12-17 | Disposition: A | Discharge status: Home / Self Care | Payer: Medicare Other | Source: Ambulatory Visit | Attending: Orthopedic Surgery | Admitting: Orthopedic Surgery

## 2021-12-17 ENCOUNTER — Ambulatory Visit (HOSPITAL_COMMUNITY)
Admission: RE | Admit: 2021-12-17 | Discharge: 2021-12-17 | Disposition: A | Discharge status: Home / Self Care | Payer: Medicare Other | Source: Ambulatory Visit | Attending: Orthopedic Surgery | Admitting: Orthopedic Surgery

## 2021-12-17 DIAGNOSIS — Z96652 Presence of left artificial knee joint: Secondary | ICD-10-CM | POA: Diagnosis not present

## 2021-12-17 DIAGNOSIS — M25562 Pain in left knee: Secondary | ICD-10-CM | POA: Diagnosis not present

## 2021-12-17 DIAGNOSIS — R948 Abnormal results of function studies of other organs and systems: Secondary | ICD-10-CM | POA: Diagnosis not present

## 2021-12-17 DIAGNOSIS — T84033A Mechanical loosening of internal left knee prosthetic joint, initial encounter: Secondary | ICD-10-CM | POA: Insufficient documentation

## 2021-12-17 DIAGNOSIS — Z471 Aftercare following joint replacement surgery: Secondary | ICD-10-CM | POA: Diagnosis not present

## 2021-12-17 MED ORDER — TECHNETIUM TC 99M MEDRONATE IV KIT
20.0000 | PACK | Freq: Once | INTRAVENOUS | Status: AC | PRN
  Administered 2021-12-17: 20 via INTRAVENOUS

## 2021-12-18 ENCOUNTER — Other Ambulatory Visit: Payer: Self-pay

## 2021-12-18 DIAGNOSIS — I10 Essential (primary) hypertension: Secondary | ICD-10-CM

## 2021-12-18 MED ORDER — LOSARTAN POTASSIUM-HCTZ 100-12.5 MG PO TABS: ORAL_TABLET | ORAL | 0 refills | Status: DC

## 2021-12-19 DIAGNOSIS — M25562 Pain in left knee: Secondary | ICD-10-CM | POA: Diagnosis not present

## 2021-12-24 DIAGNOSIS — M25562 Pain in left knee: Secondary | ICD-10-CM | POA: Diagnosis not present

## 2021-12-30 DIAGNOSIS — R262 Difficulty in walking, not elsewhere classified: Secondary | ICD-10-CM | POA: Diagnosis not present

## 2021-12-30 DIAGNOSIS — M1712 Unilateral primary osteoarthritis, left knee: Secondary | ICD-10-CM | POA: Diagnosis not present

## 2021-12-30 DIAGNOSIS — M25662 Stiffness of left knee, not elsewhere classified: Secondary | ICD-10-CM | POA: Diagnosis not present

## 2021-12-30 DIAGNOSIS — M25762 Osteophyte, left knee: Secondary | ICD-10-CM | POA: Diagnosis not present

## 2022-01-02 ENCOUNTER — Telehealth: Payer: Self-pay | Admitting: Pharmacist

## 2022-01-02 DIAGNOSIS — R262 Difficulty in walking, not elsewhere classified: Secondary | ICD-10-CM | POA: Diagnosis not present

## 2022-01-02 DIAGNOSIS — M1712 Unilateral primary osteoarthritis, left knee: Secondary | ICD-10-CM | POA: Diagnosis not present

## 2022-01-02 DIAGNOSIS — M25662 Stiffness of left knee, not elsewhere classified: Secondary | ICD-10-CM | POA: Diagnosis not present

## 2022-01-02 DIAGNOSIS — M25762 Osteophyte, left knee: Secondary | ICD-10-CM | POA: Diagnosis not present

## 2022-01-02 NOTE — Chronic Care Management (AMB) (Addendum)
Chronic Care Management Pharmacy Assistant   Name: Dorothy Rojas  MRN: [id number] DOB: [birth year]  Reason for Encounter: Disease State   Conditions to be addressed/monitored: HTN   Recent office visits:  None  Recent consult visits:  12/17/21 Patient presented to [facility] for NM Bone Scan 3 Phase Inj.  10/24/21 Alethia Berthold, PA-C ( Cardiology) - Patient presented for essential hypertension and other concerns. Decreased Amlodipine to 2.5 mg daily.   Hospital visits:  None in previous 6 months  Medications: Outpatient Encounter Medications as of 01/02/2022  Medication Sig Note   acetaminophen (TYLENOL) 500 MG tablet Take 1,000 mg by mouth 3 (three) times daily as needed for moderate pain. 09/25/2021: As needed    amLODipine (NORVASC) 2.5 MG tablet Take 1 tablet (2.5 mg total) by mouth every evening.    aspirin EC 81 MG tablet Take 81 mg by mouth daily. Swallow whole.    Calcium Carbonate (CALCIUM 500 PO) Take 500 mg by mouth daily.    Cholecalciferol (VITAMIN D) 50 MCG (2000 UT) tablet Take 2,000 Units by mouth daily.    denosumab (PROLIA) 60 MG/ML SOSY injection Inject 60 mg into the skin every 6 (six) months.    losartan-hydrochlorothiazide (HYZAAR) 100-12.5 MG tablet TAKE ONE TABLET BY MOUTH ONCE DAILY. keep appointment FOR additional refills.    Multiple Vitamins-Minerals (MULTIVITAMIN WITH MINERALS) tablet Take 1 tablet by mouth daily.    simvastatin (ZOCOR) 40 MG tablet Take 1 tablet (40 mg total) by mouth at bedtime.    SYNTHROID 50 MCG tablet TAKE 1 TABLET DAILY BEFORE BREAKFAST FOR HYPOTHYROIDISM    vitamin C (ASCORBIC ACID) 500 MG tablet Take 500 mg by mouth daily.    No facility-administered encounter medications on file as of 01/02/2022.  Reviewed chart prior to disease state call. Spoke with patient regarding BP  Recent Office Vitals: BP Readings from Last 3 Encounters:  10/24/21 (!) 120/51  10/17/21 122/70  09/25/21 130/76    Pulse Readings from Last 3 Encounters:  10/24/21 77  09/25/21 72  06/17/21 84    Wt Readings from Last 3 Encounters:  10/24/21 131 lb (59.4 kg)  09/25/21 133 lb 12.8 oz (60.7 kg)  06/17/21 132 lb (59.9 kg)     Kidney Function Lab Results  Component Value Date/Time   CREATININE 0.78 01/29/2021 09:30 AM   CREATININE 0.81 07/26/2020 11:11 AM   CREATININE 0.92 12/04/2016 07:13 AM   CREATININE 0.77 11/08/2015 12:01 AM   CREATININE 0.8 01/26/2014 08:09 AM   CREATININE 0.8 10/13/2013 09:10 AM   GFR 66.36 10/14/2019 08:44 AM   GFRNONAA >60 01/29/2021 09:30 AM   GFRAA 83 07/26/2020 11:11 AM       Latest Ref Rng & Units 09/26/2021    8:57 AM 01/29/2021    9:30 AM 07/26/2020   11:11 AM  BMP  Glucose 70 - 99 mg/dL 115   93   96    BUN 8 - 23 mg/dL  20   15    Creatinine 0.44 - 1.00 mg/dL  0.78   0.81    BUN/Creat Ratio 12 - 28   19    Sodium 135 - 145 mmol/L  138   139    Potassium 3.5 - 5.1 mmol/L  4.5   4.7    Chloride 98 - 111 mmol/L  103   101    CO2 22 - 32 mmol/L  27   25    Calcium 8.9 -  10.3 mg/dL  9.7   9.6      Current antihypertensive regimen:  Amlodipine 5 mg 1/2 tablet daily - PM - Appropriate, Effective, Safe, Accessible Losartan-HCTZ 100-12.5 mg 1 tablet daily  - in AM - Appropriate, Effective, Safe, Accessible How often are you checking your Blood Pressure? several times per month Current home BP readings: Patient reports most recent at home is 126/76 she denies any hyper/hypotensive symptoms. BP Readings from Last 3 Encounters:  10/24/21 (!) 120/51  10/17/21 122/70  09/25/21 130/76   What recent interventions/DTPs have been made by any provider to improve Blood Pressure control since last CPP Visit: Patient reports none Any recent hospitalizations or ED visits since last visit with CPP? No Notes: Patient reports she has been dealing recently with some issues on the knee she had replaced but otherwise is doing well. She expresses compliance in the use of  her hypertension medications above and has been having no issues.  Adherence Review: Is the patient currently on ACE/ARB medication? Yes Does the patient have >5 day gap between last estimated fill dates? No   Care Gaps: Bp- 120/51 (10/24/21) AWV- 8/22 CCM- Decl at this time  Star Rating Drugs: Losartan HCTZ 100-12.5 mg - Last filled 11/12/21 30 DS at [facility]  Simvastatin 40 mg - Last filled 10/19/21 90 DS at [facility] Pharmacist Assistant [phone]

## 2022-01-08 DIAGNOSIS — R262 Difficulty in walking, not elsewhere classified: Secondary | ICD-10-CM | POA: Diagnosis not present

## 2022-01-08 DIAGNOSIS — M1712 Unilateral primary osteoarthritis, left knee: Secondary | ICD-10-CM | POA: Diagnosis not present

## 2022-01-08 DIAGNOSIS — M25762 Osteophyte, left knee: Secondary | ICD-10-CM | POA: Diagnosis not present

## 2022-01-08 DIAGNOSIS — M25662 Stiffness of left knee, not elsewhere classified: Secondary | ICD-10-CM | POA: Diagnosis not present

## 2022-01-10 DIAGNOSIS — R262 Difficulty in walking, not elsewhere classified: Secondary | ICD-10-CM | POA: Diagnosis not present

## 2022-01-10 DIAGNOSIS — M1712 Unilateral primary osteoarthritis, left knee: Secondary | ICD-10-CM | POA: Diagnosis not present

## 2022-01-10 DIAGNOSIS — M25662 Stiffness of left knee, not elsewhere classified: Secondary | ICD-10-CM | POA: Diagnosis not present

## 2022-01-10 DIAGNOSIS — M25762 Osteophyte, left knee: Secondary | ICD-10-CM | POA: Diagnosis not present

## 2022-01-13 DIAGNOSIS — M25762 Osteophyte, left knee: Secondary | ICD-10-CM | POA: Diagnosis not present

## 2022-01-13 DIAGNOSIS — M1712 Unilateral primary osteoarthritis, left knee: Secondary | ICD-10-CM | POA: Diagnosis not present

## 2022-01-13 DIAGNOSIS — R262 Difficulty in walking, not elsewhere classified: Secondary | ICD-10-CM | POA: Diagnosis not present

## 2022-01-13 DIAGNOSIS — M25662 Stiffness of left knee, not elsewhere classified: Secondary | ICD-10-CM | POA: Diagnosis not present

## 2022-01-15 ENCOUNTER — Other Ambulatory Visit: Payer: Self-pay | Admitting: Student

## 2022-01-15 DIAGNOSIS — I10 Essential (primary) hypertension: Secondary | ICD-10-CM

## 2022-01-15 MED ORDER — LOSARTAN POTASSIUM-HCTZ 100-12.5 MG PO TABS: ORAL_TABLET | ORAL | 3 refills | Status: DC

## 2022-01-16 DIAGNOSIS — M25662 Stiffness of left knee, not elsewhere classified: Secondary | ICD-10-CM | POA: Diagnosis not present

## 2022-01-16 DIAGNOSIS — R262 Difficulty in walking, not elsewhere classified: Secondary | ICD-10-CM | POA: Diagnosis not present

## 2022-01-16 DIAGNOSIS — M1712 Unilateral primary osteoarthritis, left knee: Secondary | ICD-10-CM | POA: Diagnosis not present

## 2022-01-16 DIAGNOSIS — M25762 Osteophyte, left knee: Secondary | ICD-10-CM | POA: Diagnosis not present

## 2022-01-20 ENCOUNTER — Other Ambulatory Visit: Payer: Self-pay | Admitting: Family Medicine

## 2022-01-20 DIAGNOSIS — Z1231 Encounter for screening mammogram for malignant neoplasm of breast: Secondary | ICD-10-CM

## 2022-01-28 DIAGNOSIS — M1712 Unilateral primary osteoarthritis, left knee: Secondary | ICD-10-CM | POA: Diagnosis not present

## 2022-01-30 DIAGNOSIS — M4316 Spondylolisthesis, lumbar region: Secondary | ICD-10-CM | POA: Diagnosis not present

## 2022-02-17 ENCOUNTER — Encounter: Payer: Self-pay | Admitting: Family Medicine

## 2022-02-17 NOTE — Progress Notes (Unsigned)
  No chief complaint on file.   Patient presents for evaluation of a bump she noted on her RLE. She first noted it on her right calf about 2.5 weeks ago.  It is tender to touch.  She has a vague recollection of bumping it.  She has h/o DVT in 07/2013 (partially occlusive acute deep vein thrombosis involving the peroneal vein of the left lower extremity), after a plane trip. At that time she was found to be heterozygous for Factor V Leidern mutation. She was referred to oncology, saw Dr. Juliann Mule. She was treated with 6 months of Xarelto. She has been taking '81mg'$  of ASA since then.  She is flying on Friday, worries about this lump being a clot.

## 2022-02-18 ENCOUNTER — Encounter: Payer: Self-pay | Admitting: Family Medicine

## 2022-02-18 ENCOUNTER — Ambulatory Visit (INDEPENDENT_AMBULATORY_CARE_PROVIDER_SITE_OTHER): Payer: Medicare Other | Admitting: Family Medicine

## 2022-02-18 VITALS — BP 118/60 | HR 68 | Temp 97.4°F | Ht 64.0 in | Wt 131.2 lb

## 2022-02-18 DIAGNOSIS — S8011XA Contusion of right lower leg, initial encounter: Secondary | ICD-10-CM | POA: Diagnosis not present

## 2022-02-18 NOTE — Patient Instructions (Signed)
I suspect this is a small hematoma (likely from a mild injury). You can use warm compresses to see if that resolves it faster (or else it will resolve on its own). There is no indication of a blood clot.  Continue your baby aspirin. I recommend wearing compression socks/stockings on the plane. Get up every hour or two and move around.

## 2022-02-28 ENCOUNTER — Encounter: Payer: Self-pay | Admitting: Family Medicine

## 2022-03-13 ENCOUNTER — Ambulatory Visit
Admission: RE | Admit: 2022-03-13 | Discharge: 2022-03-13 | Disposition: A | Discharge status: Home / Self Care | Payer: Medicare Other | Source: Ambulatory Visit | Attending: Family Medicine | Admitting: Family Medicine

## 2022-03-13 DIAGNOSIS — Z1231 Encounter for screening mammogram for malignant neoplasm of breast: Secondary | ICD-10-CM

## 2022-03-13 DIAGNOSIS — Z780 Asymptomatic menopausal state: Secondary | ICD-10-CM | POA: Diagnosis not present

## 2022-03-13 DIAGNOSIS — M85832 Other specified disorders of bone density and structure, left forearm: Secondary | ICD-10-CM | POA: Diagnosis not present

## 2022-03-13 DIAGNOSIS — M810 Age-related osteoporosis without current pathological fracture: Secondary | ICD-10-CM | POA: Diagnosis not present

## 2022-03-14 DIAGNOSIS — N3942 Incontinence without sensory awareness: Secondary | ICD-10-CM | POA: Diagnosis not present

## 2022-03-14 DIAGNOSIS — N3020 Other chronic cystitis without hematuria: Secondary | ICD-10-CM | POA: Diagnosis not present

## 2022-03-19 ENCOUNTER — Telehealth: Payer: Self-pay | Admitting: Family Medicine

## 2022-03-19 NOTE — Telephone Encounter (Signed)
Called and left message for pt to call concerning next Prolia injection. Pt will need to schedule after 03/25/2022. Medication needs to be ordered from Pt services. Estimated cost will be $0.

## 2022-03-19 NOTE — Telephone Encounter (Signed)
Pt advised time for next prolia injection. Pt advised estimated cost is $0. Pt advised to call as leaving so medication can be set out. Pt already has an appt scheduled for 04/03/2022 so medication will be administered at the appt. Prolia will be ordered from Physician Services.

## 2022-04-01 ENCOUNTER — Encounter: Payer: Self-pay | Admitting: Family Medicine

## 2022-04-02 ENCOUNTER — Encounter: Payer: Self-pay | Admitting: Family Medicine

## 2022-04-02 NOTE — Patient Instructions (Incomplete)
  HEALTH MAINTENANCE RECOMMENDATIONS:  It is recommended that you get at least 30 minutes of aerobic exercise at least 5 days/week (for weight loss, you may need as much as 60-90 minutes). This can be any activity that gets your heart rate up. This can be divided in 10-15 minute intervals if needed, but try and build up your endurance at least once a week.  Weight bearing exercise is also recommended twice weekly.  Eat a healthy diet with lots of vegetables, fruits and fiber.  "Colorful" foods have a lot of vitamins (ie green vegetables, tomatoes, red peppers, etc).  Limit sweet tea, regular sodas and alcoholic beverages, all of which has a lot of calories and sugar.  Up to 1 alcoholic drink daily may be beneficial for women (unless trying to lose weight, watch sugars).  Drink a lot of water.  Calcium recommendations are 1200-1500 mg daily (1500 mg for postmenopausal women or women without ovaries), and vitamin D 1000 IU daily.  This should be obtained from diet and/or supplements (vitamins), and calcium should not be taken all at once, but in divided doses.  Monthly self breast exams and yearly mammograms for women over the age of 40 is recommended.  Sunscreen of at least SPF 30 should be used on all sun-exposed parts of the skin when outside between the hours of 10 am and 4 pm (not just when at beach or pool, but even with exercise, golf, tennis, and yard work!)  Use a sunscreen that says "broad spectrum" so it covers both UVA and UVB rays, and make sure to reapply every 1-2 hours.  Remember to change the batteries in your smoke detectors when changing your clock times in the spring and fall. Carbon monoxide detectors are recommended for your home.  Use your seat belt every time you are in a car, and please drive safely and not be distracted with cell phones and texting while driving.   Dorothy Rojas , Thank you for taking time to come for your Medicare Wellness Visit. I appreciate your ongoing  commitment to your health goals. Please review the following plan we discussed and let me know if I can assist you in the future.   This is a list of the screening recommended for you and due dates:  Health Maintenance  Topic Date Due   COVID-19 Vaccine (6 - Pfizer series) 11/05/2021   Flu Shot  03/11/2022   Tetanus Vaccine  10/31/2024   Colon Cancer Screening  10/01/2025   Pneumonia Vaccine  Completed   DEXA scan (bone density measurement)  Completed   Hepatitis C Screening: USPSTF Recommendation to screen - Ages 38-79 yo.  Completed   Zoster (Shingles) Vaccine  Completed   HPV Vaccine  Aged Out   Please get the high dose flu shot soon (we don't have it yet--should soon, or you can get from the pharmacy).  Please get the updated COVID vaccine when it is available (should be next month).  Please consider the new RSV vaccine when it becomes available. I think it recently became available, and you need to get it from the pharmacy.  You can either get vaccines the same day, or separate them by 2 weeks.  Consider using antihistamine (claritin, zyrtec or allegra) or Flonase (nasal steroids) for your allergies.

## 2022-04-02 NOTE — Progress Notes (Unsigned)
No chief complaint on file.   Dorothy Rojas is a 76 y.o. female who presents for annual wellness visit and follow-up on chronic medical conditions.    Seen recently with hematoma RLE.  She sent message last month regarding frequent soft stools.  Hypertension follow-up: She is compliant with losartan-HCT and amlodipine.  BP's are running   BP Readings from Last 3 Encounters:  02/18/22 118/60  10/24/21 (!) 120/51  10/17/21 122/70   Denies headaches or lightheadedness, syncope.  Denies side effects of medications. She denies exertional chest pain. She previously noted slight shortness of breath when walking outside in the humidity only.  Doesn't have any problems when walking on the indoor track at [facility]. She has been very active at the gym, without any chest pain or shortness of breath.   GERD:  Has some reflux related to her diet.  Occasionally has symptoms related to [place] food, [employer]; Nexium OTC works well.  Denies dysphagia.   Hyperlipidemia follow-up:  Patient is reportedly following a low-fat, low cholesterol diet.  Compliant with medications (simvastatin 32m) and denies medication side effects.  There was concern regarding interaction with amlodipine and simvastatin brought up by pharmacist. To consider change to 281matorvastatin.  Lab Results  Component Value Date   CHOL 122 09/26/2021   HDL 46 09/26/2021   LDLCALC 50 09/26/2021   TRIG 150 (H) 09/26/2021   CHOLHDL 2.7 09/26/2021    Impaired fasting glucose.  Last A1c was 5.8% and fasting glucose 115 in 09/2021. She continues to avoid sugar and limit carbs in her diet. 2 dark chocolate candy kisses after lunch or dinner. She drinks a lower sugar wine (Klean), 1.5-2 glasses on Fridays and Saturdays only.   H/o DVT-- s/p 6 months of anticoagulation with xarelto. She was discharged from the care of hematologist.  If any recurrence, will need lifelong treatment.    She is taking 8130mf ASA daily. (factor V  Leiden heterozygous). No other blood thinning agents were recommended.  Denies any swelling or pain in LE's.  Denies any bleeding/bruising, just recent hematoma after trauma.  Denies any abdominal pain.   MNG--denies symptoms/dysphagia or changes to her neck.  She had benign biopsies in 2020. Last [place] in 01/2020, no nodules meeting criteria for biopsy, heterogenous thyroid.   Hypothyroidism:  She remains on Synthroid 80m17mLast TSH was at goal on this regimen a year ago, due for recheck.   She denies changes to hair/skin/bowels/energy.   She does note softer, more frequent stools. She reports compliance with medication, taking on an empty stomach, separate from other medications.  She is due for recheck. Lab Results  Component Value Date   TSH 1.770 03/25/2021    Osteoporosis:  She was started on Boniva in May 2017 (DEXA done 12/10/15 showed T -2.5 at R fem neck). She tolerated it without side effects. She had DEXA done 01/2018, with T-2.3 at R fem neck;  DEXA in 01/2020 showed T-2.5 at R fem neck. She was then changed from Boniva to Prolia injections, first injection 02/2020. Last injection was 09/2021. She denies any side effects. Due for injection today. Recent DEXA (03/2022)-- T-2.5 R fem neck, -2.0 L wrist Vitamin D-OH level was 68.5 in 05/2020. She takes Calcium once daily. [place] [facility] [place] [facility] uses weights in the [employer] class. Uses machines at the gym.   She has chronic microscopic hematuria, under the care of Dr. McDiarmid.  She no longer  takes prophylactic antibiotics for UTI's, and hasn't had any recurrent infections.   She is s/p Macroplastique procedure x 2, last of which was in September 2016 and has had good results. She had worsening incontinence recently and has been doing PT at [place], which has been helping.  Doing home exercises now. Now only has leakage with sneezing, running. Sometimes when getting out of recliner.   Aortic  atherosclerosis--compliant with statin    Immunization History  Administered Date(s) Administered   Fluad Quad(high Dose 65+) 04/12/2019   Influenza Whole 04/09/2011   Influenza, High Dose Seasonal PF 05/08/2014, 05/01/2015, 05/26/2016, 05/22/2017, 06/23/2018, 04/16/2021   Influenza,inj,Quad PF,6+ Mos 04/14/2013   Influenza-Unspecified 05/22/2017, 05/04/2020, 04/16/2021   PFIZER(Purple Top)SARS-COV-2 Vaccination 09/15/2019, 10/10/2019, 05/05/2020, 11/17/2020   Pfizer Covid-19 Vaccine Bivalent Booster 63yr & up 07/08/2021   Pneumococcal Conjugate-13 11/01/2014, 05/11/2018   Pneumococcal Polysaccharide-23 04/09/2011, 03/25/2021   Td 08/07/2006   Tdap 11/01/2014   Zoster Recombinat (Shingrix) 07/28/2019, 11/03/2019   Zoster, Live 10/25/2008   Last Pap smear: can't recall. S/p hysterectomy for benign reasons   Last mammogram: 03/2022 Last colonoscopy:  09/2020, tubular adenomas.  Recheck due 5 years. Last DEXA: 03/2022 T-2.5 R fem neck, -2.0 L wrist.  (Prior was 01/2020, T-2.5 at R femur neck). Ophtho: yearly Dentist: regularly, with dentist and periodontist every 3-4 months   Exercise: [place] [employer] [place], about 4-5x/week. She is taking water fitness classes 2x/week (with weights).   Patient Care Team: KRita Ohara MD as PCP - General (Family Medicine) PViona Gilmore [facility] Pharmacist (Pharmacist) Dr. GEinar Gip(cardiology) Dr. McDiarmid (urology)   Dr. SGershon Crane(ophtho)   Dr. PHenrene Pastor(GI) Dr. SConley Canal(dentist)   Dr. CJuliann Mule(hematologist)--released from his care Dr. OAlvan Dame(ortho-knee) Dr. SOnnie Graham(ortho--shoulder) Dr. DWilhemina Bonito(dermatologist) Dr. CChristella Noa(neurosurgeon)  [place] [facility]2 Total Score 0        Falls screen:     09/25/2021   10:43 AM 06/17/2021   11:40 AM 03/25/2021    9:40 AM 12/05/2020   11:53 AM 01/26/2020    8:33 AM  FPerryin the past year? 0 _0 0  Number falls in past yr: 0 0 0 0   Comment  fell after her back surgery-tripped up door jam     Injury with Fall? 0 0 1 0   Comment   no injury    Risk for fall due to : No Fall Risks History of fall(s) Other (Comment) Other (Comment)   Risk for fall due to: Comment   tripped on neighbors door stopper slipped   Follow up Falls evaluation completed Falls evaluation completed Falls evaluation completed Falls evaluation completed      Functional Status Survey:          End of Life Discussion:  Patient has a living will and medical power of attorney    PMH, PSH, SH and FH were reviewed and updated, scanned     ROS:The patient denies anorexia, fever, headaches, vision changes, ear pain, sore throat, breast concerns, chest pain, palpitations, syncope, cough, swelling, nausea, vomiting, diarrhea, abdominal pain, melena, hematochezia, hematuria, dysuria, vaginal bleeding, discharge, odor or itch, genital lesions, numbness, tingling, weakness, tremor, suspicious skin lesions, depression, anxiety, abnormal bleeding or enlarged lymph nodes.   Intermittent vertigo, usually only when she sits up quickly from laying down, short-lived. Urinary incontinence stable per HPI. Some hearing  loss/trouble, especially in crowds, restaurants, unchanged Back pain--some at R lower back only, overall significantly improved DOE very mild only related to humidity.   PHYSICAL EXAM:  LMP  (LMP Unknown)   Wt Readings from Last 3 Encounters:  02/18/22 131 lb 3.2 oz (59.5 kg)  10/24/21 131 lb (59.4 kg)  09/25/21 133 lb 12.8 oz (60.7 kg)    General Appearance:    Alert, cooperative, no distress, appears stated age.   Head:    Normocephalic, without obvious abnormality, atraumatic     Eyes:    PERRL, conjunctiva/corneas clear, EOM's intact, fundi benign     Ears:    Normal TM's and external ear canals     Nose:    Normal, no drainage or sinus tenderness  Throat:    Normal mucosa, no lesions  Neck:     Supple, no lymphadenopathy; thyroid: mild enlargement, no nodules; no tenderness/nodules; no carotid bruit or JVD     Back:    Spine nontender, no curvature, ROM normal, no CVA tenderness. WHSS midline lower back   Lungs:    Clear to auscultation bilaterally without wheezes, rales or ronchi; respirations unlabored     Chest Wall:    No tenderness or deformity     Heart:    Regular rate and rhythm, S1 and S2 normal, no murmur, rub or gallop     Breast Exam:    No tenderness, masses, or nipple discharge. Nipples are inverted bilaterally, L>R (chronic, per pt). No axillary lymphadenopathy.     Abdomen:    Soft, non-tender, nondistended, normoactive bowel sounds, no masses, no hepatosplenomegaly     Genitalia:    Normal external genitalia without lesions. BUS and vagina normal; Uterus and adnexa surgically absent. No masses palpable     Rectal:    Normal tone, no masses or tenderness; guaiac-negative stool. Somewhat diminished sphincter tone, unchanged  Extremities:    No clubbing, cyanosis or edema. WHSS L knee  Pulses:    2+ and symmetric all extremities     Skin:    Skin color, texture, turgor normal, no rashes or suspicious lesions.  Many SK's.   Lymph nodes:    Cervical, supraclavicular, inguinal and axillary nodes normal    Neurologic:    Normal strength, sensation and gait; reflexes 2+ and symmetric throughout                       Psych:   Normal mood, affect, hygiene and grooming   ***updated--diminished sphincter tone, thyroid enlargement  ASSESSMENT/PLAN:  TSH, cbc, c-met, D   Change simva to atorva 67m and recheck lipids in 3 mos? Go back to simva if not tolerating atorva  Discussed monthly self breast exams and yearly mammograms; at least 30 minutes of aerobic activity at least 5 days/week and weight-bearing exercise 2x/week; proper sunscreen use reviewed; healthy diet, including goals of calcium and vitamin D intake and alcohol recommendations (less than or equal to 1 drink/day)  reviewed; regular seatbelt use; changing batteries in smoke detectors.  Immunization recommendations discussed-- continue yearly high dose flu shots.  Updated COVID booster recommended when available. RSV vaccine recommended when available. Colonoscopy recommendations reviewed, UTD. MOST form reviewed, unchanged. Full Code, Full Care   F/u 6 mos for med check.   Medicare Attestation I have personally reviewed: The patient's medical and social history Their use of alcohol, tobacco or illicit drugs Their current medications and supplements The patient's functional ability including ADLs,fall risks, home safety  risks, cognitive, and hearing and visual impairment Diet and physical activities Evidence for depression or mood disorders  The patient's weight, height, BMI have been recorded in the chart.  I have made referrals, counseling, and provided education to the patient based on review of the above and I have provided the patient with a written personalized care plan for preventive services.

## 2022-04-03 ENCOUNTER — Ambulatory Visit (INDEPENDENT_AMBULATORY_CARE_PROVIDER_SITE_OTHER): Payer: Medicare Other | Admitting: Family Medicine

## 2022-04-03 ENCOUNTER — Other Ambulatory Visit: Payer: Self-pay

## 2022-04-03 ENCOUNTER — Encounter: Payer: Self-pay | Admitting: Family Medicine

## 2022-04-03 VITALS — BP 110/60 | HR 64 | Ht 64.0 in | Wt 132.0 lb

## 2022-04-03 DIAGNOSIS — E7800 Pure hypercholesterolemia, unspecified: Secondary | ICD-10-CM

## 2022-04-03 DIAGNOSIS — I700 Atherosclerosis of aorta: Secondary | ICD-10-CM

## 2022-04-03 DIAGNOSIS — I10 Essential (primary) hypertension: Secondary | ICD-10-CM

## 2022-04-03 DIAGNOSIS — Z0000 Encounter for general adult medical examination without abnormal findings: Secondary | ICD-10-CM

## 2022-04-03 DIAGNOSIS — Z5181 Encounter for therapeutic drug level monitoring: Secondary | ICD-10-CM | POA: Diagnosis not present

## 2022-04-03 DIAGNOSIS — E039 Hypothyroidism, unspecified: Secondary | ICD-10-CM | POA: Diagnosis not present

## 2022-04-03 DIAGNOSIS — D6851 Activated protein C resistance: Secondary | ICD-10-CM

## 2022-04-03 DIAGNOSIS — E559 Vitamin D deficiency, unspecified: Secondary | ICD-10-CM | POA: Diagnosis not present

## 2022-04-03 DIAGNOSIS — R7301 Impaired fasting glucose: Secondary | ICD-10-CM

## 2022-04-03 DIAGNOSIS — M810 Age-related osteoporosis without current pathological fracture: Secondary | ICD-10-CM

## 2022-04-03 MED ORDER — DENOSUMAB 60 MG/ML ~~LOC~~ SOSY
60.0000 mg | PREFILLED_SYRINGE | Freq: Once | SUBCUTANEOUS | Status: AC
  Administered 2022-04-03: 60 mg via SUBCUTANEOUS

## 2022-04-04 LAB — CBC WITH DIFFERENTIAL/PLATELET
Basophils Absolute: 0 10*3/uL (ref 0.0–0.2)
Basos: 1 %
EOS (ABSOLUTE): 0.2 10*3/uL (ref 0.0–0.4)
Eos: 3 %
Hematocrit: 36.4 % (ref 34.0–46.6)
Hemoglobin: 12.2 g/dL (ref 11.1–15.9)
Immature Grans (Abs): 0 10*3/uL (ref 0.0–0.1)
Immature Granulocytes: 0 %
Lymphocytes Absolute: 2.9 10*3/uL (ref 0.7–3.1)
Lymphs: 39 %
MCH: 33.4 pg — ABNORMAL HIGH (ref 26.6–33.0)
MCHC: 33.5 g/dL (ref 31.5–35.7)
MCV: 100 fL — ABNORMAL HIGH (ref 79–97)
Monocytes Absolute: 0.7 10*3/uL (ref 0.1–0.9)
Monocytes: 9 %
Neutrophils Absolute: 3.6 10*3/uL (ref 1.4–7.0)
Neutrophils: 48 %
Platelets: 240 10*3/uL (ref 150–450)
RBC: 3.65 x10E6/uL — ABNORMAL LOW (ref 3.77–5.28)
RDW: 13 % (ref 11.7–15.4)
WBC: 7.5 10*3/uL (ref 3.4–10.8)

## 2022-04-04 LAB — COMPREHENSIVE METABOLIC PANEL
ALT: 32 IU/L (ref 0–32)
AST: 29 IU/L (ref 0–40)
Albumin/Globulin Ratio: 1.8 (ref 1.2–2.2)
Albumin: 4.6 g/dL (ref 3.8–4.8)
Alkaline Phosphatase: 49 IU/L (ref 44–121)
BUN/Creatinine Ratio: 18 (ref 12–28)
BUN: 19 mg/dL (ref 8–27)
Bilirubin Total: 0.4 mg/dL (ref 0.0–1.2)
CO2: 24 mmol/L (ref 20–29)
Calcium: 9.9 mg/dL (ref 8.7–10.3)
Chloride: 103 mmol/L (ref 96–106)
Creatinine, Ser: 1.04 mg/dL — ABNORMAL HIGH (ref 0.57–1.00)
Globulin, Total: 2.5 g/dL (ref 1.5–4.5)
Glucose: 97 mg/dL (ref 70–99)
Potassium: 4.8 mmol/L (ref 3.5–5.2)
Sodium: 143 mmol/L (ref 134–144)
Total Protein: 7.1 g/dL (ref 6.0–8.5)
eGFR: 56 mL/min/{1.73_m2} — ABNORMAL LOW (ref >59)

## 2022-04-04 LAB — VITAMIN D 25 HYDROXY (VIT D DEFICIENCY, FRACTURES): Vit D, 25-Hydroxy: 51.5 ng/mL (ref 30.0–100.0)

## 2022-04-04 LAB — LIPID PANEL
Chol/HDL Ratio: 2.6 ratio (ref 0.0–4.4)
Cholesterol, Total: 140 mg/dL (ref 100–199)
HDL: 53 mg/dL (ref >39)
LDL Chol Calc (NIH): 59 mg/dL (ref 0–99)
Triglycerides: 166 mg/dL — ABNORMAL HIGH (ref 0–149)
VLDL Cholesterol Cal: 28 mg/dL (ref 5–40)

## 2022-04-04 LAB — HEMOGLOBIN A1C
Est. average glucose Bld gHb Est-mCnc: 128 mg/dL
Hgb A1c MFr Bld: 6.1 % — ABNORMAL HIGH (ref 4.8–5.6)

## 2022-04-04 LAB — TSH: TSH: 2.51 u[IU]/mL (ref 0.450–4.500)

## 2022-04-16 ENCOUNTER — Encounter: Payer: Self-pay | Admitting: Internal Medicine

## 2022-04-23 DIAGNOSIS — Z961 Presence of intraocular lens: Secondary | ICD-10-CM | POA: Diagnosis not present

## 2022-04-23 DIAGNOSIS — H52203 Unspecified astigmatism, bilateral: Secondary | ICD-10-CM | POA: Diagnosis not present

## 2022-04-23 DIAGNOSIS — H524 Presbyopia: Secondary | ICD-10-CM | POA: Diagnosis not present

## 2022-04-28 ENCOUNTER — Emergency Department (HOSPITAL_BASED_OUTPATIENT_CLINIC_OR_DEPARTMENT_OTHER): Payer: Medicare Other

## 2022-04-28 ENCOUNTER — Encounter (HOSPITAL_BASED_OUTPATIENT_CLINIC_OR_DEPARTMENT_OTHER): Payer: Self-pay | Admitting: Emergency Medicine

## 2022-04-28 ENCOUNTER — Other Ambulatory Visit: Payer: Self-pay

## 2022-04-28 ENCOUNTER — Emergency Department (HOSPITAL_BASED_OUTPATIENT_CLINIC_OR_DEPARTMENT_OTHER)
Admission: EM | Admit: 2022-04-28 | Discharge: 2022-04-28 | Disposition: A | Discharge status: Home / Self Care | Payer: Medicare Other | Attending: Emergency Medicine | Admitting: Emergency Medicine

## 2022-04-28 ENCOUNTER — Other Ambulatory Visit (HOSPITAL_BASED_OUTPATIENT_CLINIC_OR_DEPARTMENT_OTHER): Payer: Self-pay

## 2022-04-28 DIAGNOSIS — Z7982 Long term (current) use of aspirin: Secondary | ICD-10-CM | POA: Insufficient documentation

## 2022-04-28 DIAGNOSIS — S01511A Laceration without foreign body of lip, initial encounter: Secondary | ICD-10-CM | POA: Diagnosis not present

## 2022-04-28 DIAGNOSIS — S0990XA Unspecified injury of head, initial encounter: Secondary | ICD-10-CM | POA: Diagnosis not present

## 2022-04-28 DIAGNOSIS — W01198A Fall on same level from slipping, tripping and stumbling with subsequent striking against other object, initial encounter: Secondary | ICD-10-CM | POA: Diagnosis not present

## 2022-04-28 DIAGNOSIS — Z043 Encounter for examination and observation following other accident: Secondary | ICD-10-CM | POA: Diagnosis not present

## 2022-04-28 DIAGNOSIS — S0993XA Unspecified injury of face, initial encounter: Secondary | ICD-10-CM | POA: Diagnosis not present

## 2022-04-28 DIAGNOSIS — S098XXA Other specified injuries of head, initial encounter: Secondary | ICD-10-CM | POA: Diagnosis not present

## 2022-04-28 DIAGNOSIS — Y9373 Activity, racquet and hand sports: Secondary | ICD-10-CM | POA: Insufficient documentation

## 2022-04-28 DIAGNOSIS — W19XXXA Unspecified fall, initial encounter: Secondary | ICD-10-CM

## 2022-04-28 NOTE — ED Provider Notes (Signed)
[facility] EMERGENCY DEPT Provider Note   CSN: [id number] Arrival date & time: 04/28/22  1128     History  Chief Complaint  Patient presents with   Fall   Head Injury    Dorothy Rojas is a 76 y.o. female.  76 year old female on baby aspirin presents emergency department after a fall playing pickle ball.  Says that at 11:30 AM she tripped and fell forwards onto her hands and struck her head on the ground.  Denies any LOC.  No numbness or weakness of her arms or legs.  Is not on blood thinners but does take a baby aspirin.  Says that she has been walking since the accident.  Denies any upper or lower extremity injuries or pain since the fall.  Denies any preceding symptoms.       Home Medications Prior to Admission medications   Medication Sig Start Date End Date Taking? Authorizing Provider  acetaminophen (TYLENOL) 500 MG tablet Take 1,000 mg by mouth 3 (three) times daily as needed for moderate pain.    [provider]  amLODipine (NORVASC) 2.5 MG tablet Take 1 tablet (2.5 mg total) by mouth every evening. 10/24/21 10/19/22  Cantwell, Celeste C, PA-C  aspirin EC 81 MG tablet Take 81 mg by mouth daily. Swallow whole.    [provider]  Calcium Carbonate (CALCIUM 500 PO) Take 500 mg by mouth daily.    [provider]  Cholecalciferol (VITAMIN D) 50 MCG (2000 UT) tablet Take 2,000 Units by mouth daily.    [provider]  denosumab (PROLIA) 60 MG/ML SOSY injection Inject 60 mg into the skin every 6 (six) months.    [provider]  losartan-hydrochlorothiazide (HYZAAR) 100-12.5 MG tablet TAKE ONE TABLET BY MOUTH ONCE DAILY. keep appointment FOR additional refills. 01/15/22   Cantwell, Celeste C, PA-C  Multiple Vitamins-Minerals (MULTIVITAMIN WITH MINERALS) tablet Take 1 tablet by mouth daily.    [provider]  simvastatin (ZOCOR) 40 MG tablet Take 1 tablet (40 mg total) by mouth at bedtime. 09/27/21   [name],  MD  SYNTHROID 50 MCG tablet TAKE 1 TABLET DAILY BEFORE BREAKFAST FOR HYPOTHYROIDISM 09/25/21   [name], MD  vitamin C (ASCORBIC ACID) 500 MG tablet Take 500 mg by mouth daily.    [provider]      Allergies    Naprosyn [naproxen]    Review of Systems   Review of Systems  Physical Exam Updated Vital Signs BP 138/73 (BP Location: Right Arm)   Pulse 73   Temp 97.8 F (36.6 C)   Resp 16   Ht '5\' 4"'$  (1.626 m)   Wt 59 kg   LMP  (LMP Unknown)   SpO2 100%   BMI 22.31 kg/m  Physical Exam Vitals and nursing note reviewed.  Constitutional:      General: She is not in acute distress.    Appearance: She is well-developed.  HENT:     Head: Normocephalic and atraumatic.     Right Ear: External ear normal.     Left Ear: External ear normal.     Nose: Nose normal.     Mouth/Throat:     Comments: Upper lip laceration on the mucosal surface.  No loose teeth noted. Eyes:     Extraocular Movements: Extraocular movements intact.     Conjunctiva/sclera: Conjunctivae normal.     Pupils: Pupils are equal, round, and reactive to light.  Neck:     Comments: No midline tenderness to  palpation Cardiovascular:     Rate and Rhythm: Normal rate and regular rhythm.     Heart sounds: No murmur heard. Pulmonary:     Effort: Pulmonary effort is normal. No respiratory distress.     Breath sounds: Normal breath sounds.  Abdominal:     General: Abdomen is flat. There is no distension.     Palpations: Abdomen is soft. There is no mass.     Tenderness: There is no abdominal tenderness. There is no guarding.  Musculoskeletal:        General: No swelling.     Cervical back: Normal range of motion and neck supple.     Right lower leg: No edema.     Left lower leg: No edema.     Comments: No tenderness to palpation of shoulders, elbows, or wrists including snuffbox.  No tenderness to palpation of hips, knees, or ankles.  Patient able to fully range all upper and lower extremity joints.   Skin:    General: Skin is warm and dry.     Capillary Refill: Capillary refill takes less than 2 seconds.  Neurological:     Mental Status: She is alert and oriented to person, place, and time. Mental status is at baseline.  Psychiatric:        Mood and Affect: Mood normal.     ED Results / Procedures / Treatments   Labs (all labs ordered are listed, but only abnormal results are displayed) Labs Reviewed - No data to display  EKG None  Radiology CT Maxillofacial Wo Contrast  Result Date: 04/28/2022 CLINICAL DATA:  Fall.  Facial trauma, blunt EXAM: CT MAXILLOFACIAL WITHOUT CONTRAST TECHNIQUE: Multidetector CT imaging of the maxillofacial structures was performed. Multiplanar CT image reconstructions were also generated. RADIATION DOSE REDUCTION: This exam was performed according to the departmental dose-optimization program which includes automated exposure control, adjustment of the mA and/or kV according to patient size and/or use of iterative reconstruction technique. COMPARISON:  None Available. FINDINGS: Osseous: No fracture or mandibular dislocation. No destructive process. Orbits: No fracture.  Globes intact.  No orbital emphysema. Sinuses: Clear Soft tissues: Negative Limited intracranial: See head CT report IMPRESSION: No facial or orbital fracture. Electronically Signed   By: Rolm Baptise M.D.   On: 04/28/2022 13:52   CT Cervical Spine Wo Contrast  Result Date: 04/28/2022 CLINICAL DATA:  Neck trauma (Age >= 65y).  Fall. EXAM: CT CERVICAL SPINE WITHOUT CONTRAST TECHNIQUE: Multidetector CT imaging of the cervical spine was performed without intravenous contrast. Multiplanar CT image reconstructions were also generated. RADIATION DOSE REDUCTION: This exam was performed according to the departmental dose-optimization program which includes automated exposure control, adjustment of the mA and/or kV according to patient size and/or use of iterative reconstruction technique. COMPARISON:   None Available. FINDINGS: Alignment: 1-2 mm of anterolisthesis of C6 on C7. Skull base and vertebrae: No acute fracture. No primary bone lesion or focal pathologic process. Soft tissues and spinal canal: No prevertebral fluid or swelling. No visible canal hematoma. Disc levels: Early degenerative disc disease. Moderate to advanced degenerative facet disease bilaterally, left greater than right. Upper chest: No acute findings Other: None IMPRESSION: Cervical spondylosis. No acute bony abnormality. Electronically Signed   By: Rolm Baptise M.D.   On: 04/28/2022 13:51   CT Head Wo Contrast  Result Date: 04/28/2022 CLINICAL DATA:  Head trauma, minor (Age >= 65y).  Fall. EXAM: CT HEAD WITHOUT CONTRAST TECHNIQUE: Contiguous axial images were obtained from the base of the skull through  the vertex without intravenous contrast. RADIATION DOSE REDUCTION: This exam was performed according to the departmental dose-optimization program which includes automated exposure control, adjustment of the mA and/or kV according to patient size and/or use of iterative reconstruction technique. COMPARISON:  None Available. FINDINGS: Brain: No acute intracranial abnormality. Specifically, no hemorrhage, hydrocephalus, mass lesion, acute infarction, or significant intracranial injury. Vascular: No hyperdense vessel or unexpected calcification. Skull: No acute calvarial abnormality. Sinuses/Orbits: No acute findings Other: None IMPRESSION: No acute intracranial abnormality. Electronically Signed   By: Rolm Baptise M.D.   On: 04/28/2022 13:49    Procedures Procedures   Medications Ordered in ED Medications - No data to display  ED Course/ Medical Decision Making/ A&P                           Medical Decision Making Amount and/or Complexity of Data Reviewed Radiology: ordered.   MONALISA BAYLESS is a 76 y.o. female who takes daily baby aspirin who presents with chief complaint of head strike with mechanical fall.  Initial  Ddx:  TBI, C-spine injury, extremity fracture  MDM:  Given the patient's age and aspirin use is at increased risk for TBI.  Also considered C-spine injury given her age.  Considered extremity fracture but patient has been ambulatory and does not have any signs externally of trauma to her extremities and does not have any tenderness to palpation of her extremities and still has good range of motion so feel this is highly unlikely.  With her liver laceration we will also obtain a CT of the max face to evaluate for facial fractures.  Plan:  CT head, C-spine, max face  ED Summary:  Patient underwent the above work-up which did not reveal evidence of fracture or ICH.  No symptoms of a concussion at this time.  We will have the patient follow-up with her primary doctor in several days.  Dispo: DC Home. Return precautions discussed including, but not limited to, those listed in the AVS. Allowed pt time to ask questions which were answered fully prior to dc.   Additional history obtained from spouse Records reviewed Care Everywhere I independently visualized the following imaging with scope of interpretation limited to determining acute life threatening conditions related to emergency care: CT Head, which revealed no acute abnormality   Final Clinical Impression(s) / ED Diagnoses Final diagnoses:  Accident due to mechanical fall without injury, initial encounter    Rx / DC Orders ED Discharge Orders     None         Fransico Meadow, MD 04/28/22 [phone]

## 2022-04-28 NOTE — ED Triage Notes (Signed)
Pt came down from upstairs reporting she had a mechanical fall while playing pickle ball. Pt reports she hit her head but denies LOC. Pt does not take blood thinner medication. Pt has hematoma above left eye brow along with swelling and laceration to the upper lip (no active bleeding).

## 2022-04-28 NOTE — ED Notes (Signed)
Patient verbalizes understanding of discharge instructions. Opportunity for questioning and answers were provided. Patient discharged from ED.  °

## 2022-04-28 NOTE — Discharge Instructions (Signed)
Today you are seen in the emergency department after your fall.  You had CT scans that were reassuring.  At home please use ice to prevent swelling of your lip.  Call your primary doctor in 2 to 3 days to follow-up on your emergency department visit.  Return immediately to the emergency department if you experience the following: Severe headache, numbness or weakness of your arms or legs, confusion, or any other concerning symptoms.

## 2022-04-29 ENCOUNTER — Ambulatory Visit: Payer: Medicare Other | Admitting: Podiatry

## 2022-04-29 ENCOUNTER — Telehealth: Payer: Self-pay

## 2022-04-29 NOTE — Progress Notes (Unsigned)
No chief complaint on file.   Patient presents for ER follow-up. She was seen in the ER on 9/18 after a fall while playing pickleball. She had tripped and fell forwards onto her hands, and struck her head on the ground. No LOC. She had a small laceration on the mucosal surface of the upper lip.  Review of studies done in ER: no labs done.  Imaging: Normal head CT.  Maxillofacial CT--no facial or orbital fracture. Neck CT: Early degenerative disc disease. Moderate to advanced degenerative facet disease bilaterally, left greater than right.  PMH, PSH, SH reviewed   ROS: no fever, chills, URI symptoms, chest pain. No headache, dizziness, n/v/d. Lip laceration   PHYSICAL EXAM:  LMP  (LMP Unknown)   Wt Readings from Last 3 Encounters:  04/28/22 130 lb (59 kg)  04/03/22 132 lb (59.9 kg)  02/18/22 131 lb 3.2 oz (59.5 kg)      ASSESSMENT/PLAN:  Flu shot

## 2022-04-29 NOTE — Telephone Encounter (Signed)
Transition Care Management Follow-up Telephone Call Date of discharge and from where: [facility] 04/28/22 How have you been since you were released from the hospital? [facility] Any questions or concerns? No  Items Reviewed: Did the pt receive and understand the discharge instructions provided? Yes  Medications obtained and verified? Yes  Other? No  Any new allergies since your discharge? No  Dietary orders reviewed? Yes Do you have support at home? Yes   Home Care and Equipment/Supplies: Were home health services ordered? no  Follow up appointments reviewed:  PCP Hospital f/u appt confirmed? Yes  Scheduled to see Dr. Tomi Bamberger on 04/30/22 @ 2:15. [facility] f/u appt confirmed? No   Are transportation arrangements needed? No  If their condition worsens, is the pt aware to call PCP or go to the Emergency Dept.? Yes Was the patient provided with contact information for the PCP's office or ED? Yes Was to pt encouraged to call back with questions or concerns? Yes

## 2022-04-30 ENCOUNTER — Encounter: Payer: Self-pay | Admitting: Family Medicine

## 2022-04-30 ENCOUNTER — Ambulatory Visit (INDEPENDENT_AMBULATORY_CARE_PROVIDER_SITE_OTHER): Payer: Medicare Other | Admitting: Family Medicine

## 2022-04-30 VITALS — BP 102/60 | HR 68 | Ht 64.0 in | Wt 133.2 lb

## 2022-04-30 DIAGNOSIS — S0083XD Contusion of other part of head, subsequent encounter: Secondary | ICD-10-CM

## 2022-04-30 DIAGNOSIS — Z23 Encounter for immunization: Secondary | ICD-10-CM

## 2022-04-30 NOTE — Patient Instructions (Signed)
I recommend getting the RSV vaccine from the pharmacy (wait 2 weeks from today's flu shot).  Get the COVID booster 2 weeks after that (or vice versa if the COVID booster is available in 2 weeks).

## 2022-05-06 ENCOUNTER — Encounter: Payer: Self-pay | Admitting: Family Medicine

## 2022-05-06 NOTE — Progress Notes (Unsigned)
No chief complaint on file.  Patient presents for evaluation of R thumb pain and swelling. She reports it hurts to grip anything.  She was evaluated in ER and in office for f/u on a fall she had 9/18 while playing pickleball.  She did not complain of any thumb pain at those visits. She had tripped and fell forwards, "dove"--whole front of her body hit the floor. Mainly was concerned about her tooth and gum abrasion, bruising of L face/temple.

## 2022-05-07 ENCOUNTER — Ambulatory Visit
Admission: RE | Admit: 2022-05-07 | Discharge: 2022-05-07 | Disposition: A | Discharge status: Home / Self Care | Payer: Medicare Other | Source: Ambulatory Visit | Attending: Family Medicine | Admitting: Family Medicine

## 2022-05-07 ENCOUNTER — Ambulatory Visit (INDEPENDENT_AMBULATORY_CARE_PROVIDER_SITE_OTHER): Payer: Medicare Other | Admitting: Family Medicine

## 2022-05-07 ENCOUNTER — Encounter: Payer: Self-pay | Admitting: Family Medicine

## 2022-05-07 VITALS — BP 120/62 | HR 68 | Ht 64.0 in | Wt 133.4 lb

## 2022-05-07 DIAGNOSIS — M19041 Primary osteoarthritis, right hand: Secondary | ICD-10-CM | POA: Diagnosis not present

## 2022-05-07 DIAGNOSIS — M79644 Pain in right finger(s): Secondary | ICD-10-CM

## 2022-05-07 NOTE — Patient Instructions (Signed)
Go to [facility] (either 301 or 315) to get xrays of the right thumb to evaluate for fracture and/or arthritis. I suspect there is a strain. Continue ice as needed. Treat with Aleve 2 pills twice daily with food (decrease the dose if it bothers your stomach), regularly until pain has improved (up to 7-10 days). If x-rays show arthritis, then going forward (once swelling and pain has improved some), you can use Tylenol Arthritis and topical Voltaren gel as needed for pain.

## 2022-05-14 ENCOUNTER — Telehealth: Payer: Self-pay | Admitting: Pharmacist

## 2022-05-14 DIAGNOSIS — Z23 Encounter for immunization: Secondary | ICD-10-CM | POA: Diagnosis not present

## 2022-05-14 NOTE — Chronic Care Management (AMB) (Signed)
Chronic Care Management Pharmacy Assistant   Name: Dorothy Rojas  MRN: [id number] DOB: 03/12/1946  Reason for Encounter: ER Follow up per [name]    Recent office visits:  05/07/22 [name], MD - Patient presented for Right Thumb Pain. Recommended Ice and Aleve. No medication changes  04/30/22  [name], MD - Patient presented for Contusion of face subsequent encounter and other concerns. No medication changes.  04/03/22  [name], MD - Patient presented for Medicare Annual Wellness visit and other concerns. Prescribed Prolia  02/18/22 [name], MD - Patient presented for Hematoma of right lower extremity initial encounter. No medication changes.  Recent consult visits:  04/23/22 [name], MD  (Ophthalmology) - Patient presented for Eye Exam. No medication changes.  01/30/22 Cabbell MD, Vickki Muff ([facility]) - Patient presented for Spondylolisthesis lumbar region. No medication changes.  Hospital visits:  Medication Reconciliation was completed by comparing discharge summary, patient's EMR and Pharmacy list, and upon discussion with patient.  Patient presented to [facility] ED on 04/28/22 due to Accident due to mechanical fall without injury. Patient was present for 3 hours.  New?Medications Started at [facility] Discharge:?? -started  none  Medication Changes at Hospital Discharge: -Changed  none  Medications Discontinued at Hospital Discharge: -Stopped  none  Medications that remain the same after Hospital Discharge:??  -All other medications will remain the same.    Medications: Outpatient Encounter Medications as of 05/14/2022  Medication Sig Note   acetaminophen (TYLENOL) 500 MG tablet Take 1,000 mg by mouth 3 (three) times daily as needed for moderate pain. (Patient not taking: Reported on 05/07/2022) 05/07/2022: prn   amLODipine (NORVASC) 2.5 MG tablet Take 1 tablet (2.5 mg total) by mouth every evening.    aspirin EC 81 MG  tablet Take 81 mg by mouth daily. Swallow whole.    Calcium Carbonate (CALCIUM 500 PO) Take 500 mg by mouth daily.    Cholecalciferol (VITAMIN D) 50 MCG (2000 UT) tablet Take 2,000 Units by mouth daily.    denosumab (PROLIA) 60 MG/ML SOSY injection Inject 60 mg into the skin every 6 (six) months.    diphenhydramine-acetaminophen (TYLENOL PM) 25-500 MG TABS tablet Take 1 tablet by mouth at bedtime as needed. 05/07/2022: Took one last night   losartan-hydrochlorothiazide (HYZAAR) 100-12.5 MG tablet TAKE ONE TABLET BY MOUTH ONCE DAILY. keep appointment FOR additional refills.    Multiple Vitamins-Minerals (MULTIVITAMIN WITH MINERALS) tablet Take 1 tablet by mouth daily.    simvastatin (ZOCOR) 40 MG tablet Take 1 tablet (40 mg total) by mouth at bedtime.    SYNTHROID 50 MCG tablet TAKE 1 TABLET DAILY BEFORE BREAKFAST FOR HYPOTHYROIDISM    vitamin C (ASCORBIC ACID) 500 MG tablet Take 500 mg by mouth daily.    No facility-administered encounter medications on file as of 05/14/2022.  Contacted [name] for [employer] Review Call   Chart Review:  Adherence Review:  Does the Clinical Pharmacist Assistant have access to adherence rates? Yes Adherence rates for STAR metric medications  Losartan-HCTZ 100-12.'5mg'$  - Last filled 04/12/22 90 DS at [facility] 100-12.'5mg'$  - Last filled 01/15/22 90 DS at [facility] Simvastatin 40 mg - Last filled 04/22/22 90 DS at [facility] Simvastatin 40 mg - Last filled 01/17/22 90 DS [facility]  Does the patient have >5 day gap between last estimated fill dates for any of the above medications or other medication gaps? No   Disease State Questions:  Able to connect with Patient? Yes Did patient have any problems with their health recently? No  Have you had any admissions or emergency room visits or worsening of your condition(s) since last visit? Yes due to fall  Have you had any visits with new specialists or providers since your last visit? No  Have  you had any new health care problem(s) since your last visit? No  Have you run out of any of your medications since you last spoke with clinical pharmacist? No  Are there any medications you are not taking as prescribed? No  Are you having any issues or side effects with your medications? No  Do you have any other health concerns or questions you want to discuss with your Clinical Pharmacist before your next visit? No  Are there any health concerns that you feel we can do a better job addressing? No  Are you having any problems with any of the following since the last visit: (select all that apply)  None   12. Any falls since last visit? No  13. Any increased or uncontrolled pain since last visit? No   Additional Details? Patient reports her thumb is much better now. She has no questions or concerns at this time.   Care Gaps: COVID Booster - Overdue Bp- 120/62 05/07/22 AWV- 8/23 CCM - Declined for future  Star Rating Drugs: Losartan-HCTZ 100-12.'5mg'$  - Last filled 04/12/22 90 DS at [facility] Simvastatin 40 mg - Last filled 04/22/22 90 DS at [facility] Pharmacist Assistant [phone]

## 2022-05-15 ENCOUNTER — Encounter: Payer: Self-pay | Admitting: Family Medicine

## 2022-05-18 ENCOUNTER — Other Ambulatory Visit: Payer: Self-pay | Admitting: Family Medicine

## 2022-05-18 DIAGNOSIS — E039 Hypothyroidism, unspecified: Secondary | ICD-10-CM

## 2022-07-07 ENCOUNTER — Other Ambulatory Visit: Payer: Self-pay | Admitting: Family Medicine

## 2022-07-07 DIAGNOSIS — E039 Hypothyroidism, unspecified: Secondary | ICD-10-CM

## 2022-07-07 MED ORDER — SYNTHROID 50 MCG PO TABS: ORAL_TABLET | ORAL | 0 refills | Status: DC

## 2022-07-29 DIAGNOSIS — M25572 Pain in left ankle and joints of left foot: Secondary | ICD-10-CM | POA: Diagnosis not present

## 2022-07-29 DIAGNOSIS — M1712 Unilateral primary osteoarthritis, left knee: Secondary | ICD-10-CM | POA: Diagnosis not present

## 2022-08-19 ENCOUNTER — Encounter: Payer: Self-pay | Admitting: Family Medicine

## 2022-08-19 NOTE — Progress Notes (Unsigned)
Start time: End time:  Virtual Visit via Video Note  I connected with Dorothy Rojas on 08/19/22 by a video enabled telemedicine application and verified that I am speaking with the correct person using two identifiers.  Location: Patient: *** Provider: office   I discussed the limitations of evaluation and management by telemedicine and the availability of in person appointments. The patient expressed understanding and agreed to proceed.  History of Present Illness:  No chief complaint on file.     Observations/Objective:  LMP  (LMP Unknown)    Assessment and Plan:   Follow Up Instructions:    I discussed the assessment and treatment plan with the patient. The patient was provided an opportunity to ask questions and all were answered. The patient agreed with the plan and demonstrated an understanding of the instructions.   The patient was advised to call back or seek an in-person evaluation if the symptoms worsen or if the condition fails to improve as anticipated.  I spent *** minutes dedicated to the care of this patient, including pre-visit review of records, face to face time, post-visit ordering of testing and documentation.    [name], MD

## 2022-08-20 ENCOUNTER — Other Ambulatory Visit (INDEPENDENT_AMBULATORY_CARE_PROVIDER_SITE_OTHER): Payer: Medicare Other

## 2022-08-20 ENCOUNTER — Encounter: Payer: Self-pay | Admitting: Family Medicine

## 2022-08-20 ENCOUNTER — Telehealth (INDEPENDENT_AMBULATORY_CARE_PROVIDER_SITE_OTHER): Payer: Medicare Other | Admitting: Family Medicine

## 2022-08-20 ENCOUNTER — Other Ambulatory Visit: Payer: Self-pay | Admitting: *Deleted

## 2022-08-20 VITALS — BP 122/66 | HR 83 | Temp 97.6°F | Ht 64.0 in | Wt 131.0 lb

## 2022-08-20 DIAGNOSIS — R0989 Other specified symptoms and signs involving the circulatory and respiratory systems: Secondary | ICD-10-CM

## 2022-08-20 DIAGNOSIS — R6883 Chills (without fever): Secondary | ICD-10-CM | POA: Diagnosis not present

## 2022-08-20 DIAGNOSIS — R509 Fever, unspecified: Secondary | ICD-10-CM | POA: Diagnosis not present

## 2022-08-20 DIAGNOSIS — J069 Acute upper respiratory infection, unspecified: Secondary | ICD-10-CM | POA: Diagnosis not present

## 2022-08-20 DIAGNOSIS — R059 Cough, unspecified: Secondary | ICD-10-CM

## 2022-08-20 LAB — POCT INFLUENZA A/B
Influenza A, POC: NEGATIVE
Influenza B, POC: NEGATIVE

## 2022-08-20 LAB — POC COVID19 BINAXNOW: SARS Coronavirus 2 Ag: NEGATIVE

## 2022-08-20 NOTE — Patient Instructions (Signed)
Stay well hydrated. Use Tylenol as needed for fever or pain. Continue using the mucinex DM as needed for any thick mucus or cough. Use sinus rinses if needed for sinus pain (remember to use distilled or boiled water, not tap water).  If your runny nose is bothersome, you can either try a 24 hour antihistamine (allegra, zyrtec or claritin); if that isn't helpful, Coricidin HBP might help more, but l believe it contains dextromethorphan (cough suppressant), so you will need to stop the mucinex DM that you're taking (and switch to a PLAIN mucinex, which is just guaifenesin).  If you have persistent fevers, worsening cough, shortness of breath, pain with breathing, increased sinus pain or other worsening symptoms, please contact us.  I hope you feel better soon!

## 2022-09-04 ENCOUNTER — Other Ambulatory Visit: Payer: Self-pay | Admitting: Family Medicine

## 2022-09-04 DIAGNOSIS — E039 Hypothyroidism, unspecified: Secondary | ICD-10-CM

## 2022-09-05 MED ORDER — SYNTHROID 50 MCG PO TABS: ORAL_TABLET | ORAL | 1 refills | Status: DC

## 2022-09-10 ENCOUNTER — Encounter: Payer: Self-pay | Admitting: Family Medicine

## 2022-09-18 ENCOUNTER — Encounter: Payer: Self-pay | Admitting: Family Medicine

## 2022-09-18 ENCOUNTER — Other Ambulatory Visit: Payer: Self-pay | Admitting: *Deleted

## 2022-09-18 DIAGNOSIS — E782 Mixed hyperlipidemia: Secondary | ICD-10-CM

## 2022-09-18 DIAGNOSIS — Z5181 Encounter for therapeutic drug level monitoring: Secondary | ICD-10-CM

## 2022-09-18 DIAGNOSIS — R7301 Impaired fasting glucose: Secondary | ICD-10-CM

## 2022-09-23 ENCOUNTER — Other Ambulatory Visit: Payer: Medicare Other

## 2022-09-23 DIAGNOSIS — Z5181 Encounter for therapeutic drug level monitoring: Secondary | ICD-10-CM | POA: Diagnosis not present

## 2022-09-23 DIAGNOSIS — E782 Mixed hyperlipidemia: Secondary | ICD-10-CM | POA: Diagnosis not present

## 2022-09-23 DIAGNOSIS — R7301 Impaired fasting glucose: Secondary | ICD-10-CM | POA: Diagnosis not present

## 2022-09-24 LAB — LIPID PANEL
Chol/HDL Ratio: 2.7 ratio (ref 0.0–4.4)
Cholesterol, Total: 129 mg/dL (ref 100–199)
HDL: 48 mg/dL (ref >39)
LDL Chol Calc (NIH): 52 mg/dL (ref 0–99)
Triglycerides: 177 mg/dL — ABNORMAL HIGH (ref 0–149)
VLDL Cholesterol Cal: 29 mg/dL (ref 5–40)

## 2022-09-28 NOTE — Progress Notes (Unsigned)
No chief complaint on file.  Patient presents to follow up on chronic problems.  She was seen for viral URI last month, symptoms completely resolved. She was seen in September with R thumb pain (after fall while holding racket playing pickleball).  X-rays showed arthritic changes.  Thumb pain?  Hypertension follow-up: She is compliant with losartan-HCT and amlodipine.  BP's are running   BP Readings from Last 3 Encounters:  08/20/22 122/66  05/07/22 120/62  04/30/22 102/60   Denies headaches or lightheadedness, syncope.  Denies side effects of medications. She denies exertional chest pain.  She reports some DOE, worse when walking outside in the humidity.  Doesn't have any problems when walking on the indoor track at [place] or other activities at the gym. Gets a little winded if up and down the stairs a lot, especially if carrying things.  Got out of breath walking up the large hill at [facility] after playing pickleball. No problems while playing, just on the hill.   She has seen Dr. Einar Gip in the past for evaluation of DOE, with stress test and echo in 2021 (low risk).  She last saw an APP at his office in 10/2021, goes yearly. She has known atherosclerosis.  There is FHx of CAD, no personal history.   GERD:  Has some reflux related to her diet.  Occasionally has symptoms related to [place] food, [employer]. Nexium OTC works well.  Rarely needs medication. Denies dysphagia.   Hyperlipidemia follow-up:  Patient is reportedly following a low-fat, low cholesterol diet.  Compliant with medications (simvastatin 67m) and denies medication side effects.  There is potential interaction with amlodipine and simvastatin.  We discussed potential change to 215matorvastatin at her last visit, but she preferred not to change her statin.  Amlodipine dose is low. She denies any myalgias. She had labs prior to today visit: Lab Results  Component Value Date   CHOL 129 09/23/2022   HDL 48 09/23/2022    LDLCALC 52 09/23/2022   TRIG 177 (H) 09/23/2022   CHOLHDL 2.7 09/23/2022     Impaired fasting glucose.  Last A1c was 6.1% in 03/2022 (up from 5.8). She continues to avoid sugar and limit carbs in her diet. 2 dark chocolate candy kisses after lunch or dinner. She drinks a lower sugar wine (Klean), 1.5-2 glasses up to 4-5x per week (had limited it to Fri/Sat for a while, having a little more frequently for >6 months). She gets regular exercise.    H/o DVT-- s/p 6 months of anticoagulation with xarelto. She was discharged from the care of hematologist.  If any recurrence, will need lifelong treatment.    She is taking 8194mf ASA daily. (factor V Leiden heterozygous). No other blood thinning agents were recommended.  Denies any swelling or pain in LE's.  Denies any bleeding/bruising.  Denies any abdominal pain.   MNG--denies symptoms/dysphagia or changes to her neck.  She had benign biopsies in 2020. Last [place] in 01/2020, no nodules meeting criteria for biopsy, heterogenous thyroid.   Hypothyroidism:  She remains on Synthroid 35m22mLast TSH was at goal on this regimen, in 03/2022. She denies changes to hair/skin/bowels/energy.  Slight dry skin (chronic). She reports compliance with medication, taking on an empty stomach, separate from other medications.   Lab Results  Component Value Date   TSH 2.510 04/03/2022    Osteoporosis:  She was started on Boniva in May 2017 (DEXA done 12/10/15 showed T -2.5 at R fem neck). She tolerated it  without side effects, but didn't show any improvement (01/2018 T-2.3 at R fem neck, 01/2020 T-2.5 at R fem neck). She was then changed from Boniva to Prolia injections, first injection 02/2020, last was 03/2022. She denies side effects. Recent DEXA (03/2022)-- T-2.5 R fem neck, -2.0 L wrist Vitamin D-OH level was 51.5 in 03/2022.  She takes Calcium once daily and tries to get more calcium through her diet. Uses weights in the [employer] class (1-2x/week). Uses  machines at the gym 2x/week (40 mins).   She has chronic microscopic hematuria and h/o frequent UTI's.  She no longer takes prophylactic antibiotics for UTI's, and hasn't had any recurrent infections.  She is s/p Macroplastique procedure x 2, last of which was in September 2016. She had worsening incontinence, had PT at [facility], which helped.  Doing home exercises now. She was discharged from Dr. McDiarmid's care. Now only has leakage with sneezing, running. Sometimes when getting out of recliner. Overall much better.   Aortic atherosclerosis--compliant with statin, denies side effects.    PMH, PSH, SH reviewed    ROS:  No fever, chills, URI symptoms, chest pain, palpitations. No significant SOB/DOE, see HPI. No edema. No bleeding, bruising, rash. No urinary complaints, leakage per HPI. Moods are good.  Thumb pain? Back pain?   PHYSICAL EXAM:  LMP  (LMP Unknown)   Wt Readings from Last 3 Encounters:  08/20/22 131 lb (59.4 kg)  05/07/22 133 lb 6.4 oz (60.5 kg)  04/30/22 133 lb 3.2 oz (60.4 kg)   Well developed, pleasant female in no distress HEENT: PERRL, EOMI, conjunctiva clear.  Neck: no lymphadenopathy or carotid bruit. Thyroid is borderline in size, no obvious nodule or asymmetry noted.  Heart: regular rate and rhythm, no murmur Lungs: clear bilaterally, good air movement. Back: no spinal or CVA tenderness Abdomen: soft, nontender, no organomegaly or mass Extremities: no edema, 2+ pulses Neuro: alert and oriented. Normal strength, gait Psych: normal mood, affect, hygiene and grooming Skin: no visible bruising or rashes    ASSESSMENT/PLAN:  A1C WAS NOT RELEASED WHEN SHE CAME FOR HER LABS--NEED A1C DONE TODAY (AND CAN CANCEL THE FUTURE ORDER THAT WAS PLACED)  Due for Prolia--has she heard from [place]? I don't see anything scheduled.  Add fish oil? Needs simva RF (vs change statin)  F/u 6 months for AWV Labs prior?? ***

## 2022-09-30 ENCOUNTER — Ambulatory Visit (INDEPENDENT_AMBULATORY_CARE_PROVIDER_SITE_OTHER): Payer: Medicare Other | Admitting: Family Medicine

## 2022-09-30 ENCOUNTER — Encounter: Payer: Self-pay | Admitting: Family Medicine

## 2022-09-30 VITALS — BP 130/70 | HR 80 | Ht 64.0 in | Wt 133.0 lb

## 2022-09-30 DIAGNOSIS — E559 Vitamin D deficiency, unspecified: Secondary | ICD-10-CM

## 2022-09-30 DIAGNOSIS — R7301 Impaired fasting glucose: Secondary | ICD-10-CM

## 2022-09-30 DIAGNOSIS — M810 Age-related osteoporosis without current pathological fracture: Secondary | ICD-10-CM | POA: Diagnosis not present

## 2022-09-30 DIAGNOSIS — Z5181 Encounter for therapeutic drug level monitoring: Secondary | ICD-10-CM

## 2022-09-30 DIAGNOSIS — I10 Essential (primary) hypertension: Secondary | ICD-10-CM | POA: Diagnosis not present

## 2022-09-30 DIAGNOSIS — I700 Atherosclerosis of aorta: Secondary | ICD-10-CM | POA: Diagnosis not present

## 2022-09-30 DIAGNOSIS — E039 Hypothyroidism, unspecified: Secondary | ICD-10-CM

## 2022-09-30 DIAGNOSIS — E782 Mixed hyperlipidemia: Secondary | ICD-10-CM | POA: Diagnosis not present

## 2022-09-30 LAB — POCT GLYCOSYLATED HEMOGLOBIN (HGB A1C): Hemoglobin A1C: 6.1 % — AB (ref 4.0–5.6)

## 2022-09-30 MED ORDER — ATORVASTATIN CALCIUM 20 MG PO TABS: 20.0000 mg | ORAL_TABLET | Freq: Every day | ORAL | 2 refills | Status: DC

## 2022-09-30 NOTE — Patient Instructions (Addendum)
Consider trying omega-3 fish oil (2000-3000 mg daily) to see if that helps lower the triglycerides.  We discussed switching your simvastatin due to the potential interaction with your amlodipine. STOP the simvastatin 59m. Take the atorvastatin 248monce daily instead. If you have any muscle pains or side effects, you can add Coenzyme Q10 daily to see if the side effects resolved. If you can't tolerate the atorvastatin, we can try 1075mf rosuvastatin once daily.   If you don't like either of these, we can go back to the simvastatin, which hasn't caused you problems (likely due to the amlodipine dose being very low).  Recheck your cholesterol panel in 3 months.  Your new calcium doesn't have any D, so be sure to continue the multivitamin daily.  You may want to consider adding 1000 IU of D3 just in the winter months (October through March, when you get less sun exposure).  Your pain/headaches might be contributed by posture as well-be sure to maintain a neutral neck position, and not look down for long periods of time.  This  is a common cause of headaches at the back of the head.

## 2022-10-02 ENCOUNTER — Encounter: Payer: Medicare Other | Admitting: Family Medicine

## 2022-10-09 ENCOUNTER — Telehealth: Payer: Self-pay | Admitting: Family Medicine

## 2022-10-09 NOTE — Telephone Encounter (Signed)
Pt called concerning Prolia. Pt advised estimated cost is $0 but deductible has not been met. Pt scheduled an appt for 10/15/2022 and advised to call as leaving so medication can be set out. Pt verbalized understanding. Medication ordered from Physician Services.

## 2022-10-09 NOTE — Telephone Encounter (Signed)
Left message for pt to call concerning Prolia. Patient needs to speak to Juliann Pulse to get all the information.

## 2022-10-15 ENCOUNTER — Other Ambulatory Visit (INDEPENDENT_AMBULATORY_CARE_PROVIDER_SITE_OTHER): Payer: Medicare Other

## 2022-10-15 DIAGNOSIS — M810 Age-related osteoporosis without current pathological fracture: Secondary | ICD-10-CM | POA: Diagnosis not present

## 2022-10-15 MED ORDER — DENOSUMAB 60 MG/ML ~~LOC~~ SOSY
60.0000 mg | PREFILLED_SYRINGE | Freq: Once | SUBCUTANEOUS | Status: AC
  Administered 2022-10-15: 60 mg via SUBCUTANEOUS

## 2022-10-23 ENCOUNTER — Ambulatory Visit (INDEPENDENT_AMBULATORY_CARE_PROVIDER_SITE_OTHER): Payer: Medicare Other

## 2022-10-23 ENCOUNTER — Ambulatory Visit (INDEPENDENT_AMBULATORY_CARE_PROVIDER_SITE_OTHER): Payer: Medicare Other | Admitting: Podiatry

## 2022-10-23 ENCOUNTER — Other Ambulatory Visit: Payer: Self-pay | Admitting: Podiatry

## 2022-10-23 ENCOUNTER — Encounter: Payer: Self-pay | Admitting: Podiatry

## 2022-10-23 DIAGNOSIS — M722 Plantar fascial fibromatosis: Secondary | ICD-10-CM

## 2022-10-23 DIAGNOSIS — M7752 Other enthesopathy of left foot: Secondary | ICD-10-CM

## 2022-10-23 DIAGNOSIS — M5386 Other specified dorsopathies, lumbar region: Secondary | ICD-10-CM | POA: Insufficient documentation

## 2022-10-23 MED ORDER — TRIAMCINOLONE ACETONIDE 40 MG/ML IJ SUSP
20.0000 mg | Freq: Once | INTRAMUSCULAR | Status: AC
  Administered 2022-10-23: 20 mg

## 2022-10-23 MED ORDER — MELOXICAM 15 MG PO TABS: 15.0000 mg | ORAL_TABLET | Freq: Every day | ORAL | 3 refills | Status: DC

## 2022-10-23 MED ORDER — METHYLPREDNISOLONE 4 MG PO TBPK: ORAL_TABLET | ORAL | 0 refills | Status: DC

## 2022-10-23 NOTE — Progress Notes (Signed)
She presents today with chief complaint of plantar and posterior heel pain left.  She states been aching for the past 2 to 3 days she did fall on her back last September playing pickle ball she is not sure if that has anything to do with that or not.  Objective: Vital signs stable oriented x 3.  Pulses are palpable.  She has pain on palpation medial calcaneal tubercle of the left heel.  No pain on palpation of the calcaneus.  Radiographs taken today demonstrate osseously mature individual relatively good mineralization of the bone plantar distally oriented calcaneal heel spur with soft tissue increase in density plantar fascial kidney insertion site indicative of Planter fasciitis.  Assessment: Pain in limb secondary to Planter fasciitis.  Plan: Started on methylprednisolone to be followed by meloxicam.  Injected 10 mg Kenalog 5 mg Marcaine point maximal tenderness.  Tolerated procedure well without complications.  Was given both oral written home-going instructions and we discussed appropriate shoe gear stretching excise ice therapy sugar modifications.

## 2022-10-23 NOTE — Patient Instructions (Signed)

## 2022-10-27 ENCOUNTER — Ambulatory Visit: Payer: Medicare Other | Admitting: Student

## 2022-10-27 ENCOUNTER — Ambulatory Visit: Payer: Medicare Other | Admitting: Cardiology

## 2022-10-27 ENCOUNTER — Encounter: Payer: Self-pay | Admitting: Cardiology

## 2022-10-27 VITALS — BP 109/62 | HR 62 | Resp 16 | Ht 64.0 in | Wt 130.8 lb

## 2022-10-27 DIAGNOSIS — I10 Essential (primary) hypertension: Secondary | ICD-10-CM | POA: Diagnosis not present

## 2022-10-27 DIAGNOSIS — E7800 Pure hypercholesterolemia, unspecified: Secondary | ICD-10-CM

## 2022-10-27 MED ORDER — LOSARTAN POTASSIUM-HCTZ 100-12.5 MG PO TABS: ORAL_TABLET | ORAL | 0 refills | Status: DC

## 2022-10-27 MED ORDER — AMLODIPINE BESYLATE 2.5 MG PO TABS: 2.5000 mg | ORAL_TABLET | Freq: Every evening | ORAL | 0 refills | Status: DC
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
## 2022-11-05 ENCOUNTER — Telehealth: Payer: Self-pay

## 2022-11-06 ENCOUNTER — Encounter: Payer: Self-pay | Admitting: Family Medicine

## 2022-11-06 ENCOUNTER — Encounter: Payer: Self-pay | Admitting: Podiatry

## 2022-11-12 NOTE — Telephone Encounter (Signed)
Patient has been updated.

## 2022-11-12 NOTE — Telephone Encounter (Signed)
No further information is needed 

## 2022-11-13 NOTE — Progress Notes (Signed)
Medical Nutrition Therapy Appt start time: 0930 end time: 1000 (30 minutes) Primary concerns today: Blood sugar control (as well as osteoporosis).   Relevant history/background: [place] GraterMaggie was seen for a nutrition consult as part of [employer] program.  She was dx'd with pre-DM in 2022 (and has fam hx of DM, mom & brother).  Other diagnoses include HTN and  osteoporosis for which she takes Prolia. See T-scores below. A1c on 04/03/22 was 5.6% and 6.1 on 09/30/22 (with TG of 177).  She has reduced CHO and energy intake since 09/30/22.  Has lost ~7 lb since then; 124 lb today (ht is 64").  She does not qualify for Medicare coverage of the DPP b/c she would have to gain 22 lb to meet BMI eligibility criterion - despite her A1c and keen interest in learning how to prevent DM.     T-scores from DXA scan, 04/02/22:  Femur Neck Right 03/13/2022 - 2.5  Femur Neck Right 01/30/2020 - 2.5 Femur Total Mean 03/13/2022 - 1.7 Femur Total Mean 01/30/2020 - 1.7    Assessment:  Maggie lives with her husband, and she does most food prep.     Learning Readiness: Ready  Usual eating pattern: 3 meals and 0-1 snacks per day. Frequent foods and beverages: Water, 1 c diet cranberry juice 3-4 X wk, 1 glass alcohol-free red wine (Klean), decaf w/ 1 tbsp flvrd crmr & 2 pks Swt'n Low 2-3 X wk, lactose-free fat-free milk; chx, beef, 2 dark choc Kisses 3 X wk, deli turk-chs sandw w/ yog-butter, Sp K Frt & Yog, Chobani sug-free yogurt with Kind (low-sug) granola.  Avoided foods: onions, pickles, peas, most beans.   Weight: 124.4 lb.  (Ht is 64".) Usual physical activity: [employer] 45-min classes: M- chair yoga; T- [employer]; W- strength ex's (15 min ellipt, 30-45 min other ex's); Th- [employer]; F- Bal&Flex.  Sporadically walks on wkends. Sleep: Estimates 7 hours/night.  Sometimes has with waking and then falling back to asleep.   24-hr recall: (Up at 6 AM; drank 1 c water with med's) B (7 AM)-   1 slc cinn-raisin toast,  1/2 tbsp Brummel & Brown yog spread, 1/2 banana, 1 c decaf, 1 tbsp diet flavored creamer, 1 pkt Splenda  Snk ( AM)-   water L (12 PM)-  1 slc deli Malawiturkey on sandwich thin (150 kcal), 1 slc chs, 1/2 tbsp yog spread, handful Kettle chips, water Snk (3 PM)-  1/2 c peanut butter pretzels (130 kcal, 19 g CHO), water D (7 PM)-  1 Healthy Choice Bowl chx alfredo with broccoli (190 kcal, 8 g CHO, 28 g pro), 1 g alc-free wine Snk ( PM)-  --- Typical day? Yes.   Estimates she had ~32 oz water yesterday.    Nutritional Diagnosis:  NB-1.1 Food and nutrition-related knowledge deficit As related to BG management.  As evidenced by recently increased A1c.  Handouts given during visit include: After-Visit Summary (AVS) Handout on bone health Meal Planning form  Demonstrated degree of understanding via:  Teach Back  Barriers to learning/adherence to lifestyle change: Narrow range of preferred foods, including vegetables.    Monitoring/Evaluation:  Dietary intake, exercise, and body weight  as able .  F/U will also be out of pocket expense, as Medicare will not cover MNT b/c patient does not have frank DM or CKD.

## 2022-11-17 ENCOUNTER — Encounter: Payer: Self-pay | Admitting: Family Medicine

## 2022-11-17 ENCOUNTER — Ambulatory Visit (INDEPENDENT_AMBULATORY_CARE_PROVIDER_SITE_OTHER): Payer: Medicare Other | Admitting: Family Medicine

## 2022-11-17 VITALS — Ht 64.0 in | Wt 124.4 lb

## 2022-11-17 DIAGNOSIS — M810 Age-related osteoporosis without current pathological fracture: Secondary | ICD-10-CM | POA: Diagnosis not present

## 2022-11-17 NOTE — Patient Instructions (Addendum)
  Diet Recommendations for Diabetes  Carbohydrate includes starch, sugar, and fiber.  Of these, only sugar and starch raise blood glucose.  (Fiber is found in fruits, vegetables [especially skin, seeds, and stalks], whole grains, and beans.)   Starchy (carb) foods: Bread, rice, pasta, potatoes, corn, cereal, grits, crackers, bagels, muffins, all baked goods.  (Fruit, milk, and yogurt also have carbohydrate, but most of these foods will not spike your blood sugar as most starchy or sweet foods will.)  A few fruits do cause high blood sugars; use small portions of bananas (limit to 1/2 at a time), grapes, watermelon, and oranges.   Protein foods: Meat, fish, poultry, eggs, dairy foods, and beans such as pinto and kidney beans (beans also provide carbohydrate).   1. Eat at least 3 REAL meals and 1-2 snacks per day.  Eat breakfast within one hour of getting up.  Have something to eat at least every 5 hours while awake.  - A REAL breakfast needs to include both starch and protein foods.   - A REAL meal for lunch or dinner includes at least some protein, some starch, and vegetables and/or fruit.     2. Limit starchy foods to TWO portions per meal and ONE per snack. ONE portion of a starchy food is equal to the following:  - ONE slice of bread (or its equivalent, such as half of a hamburger bun).  - 1/2 cup of a "scoopable" starchy food such as potatoes or rice.  - 15 grams of Total Carbohydrate as shown on food label.  - 4 ounces of a sweet drink (including fruit juice).  3. Include vegetables in at least 10 meals per week.  - Fresh or frozen vegetables are best.  - Keep frozen vegetables on hand for a quick option.       Recommendations: Complete the Meal Planning form provided today.    Factors involved in how much a food raises blood glucose (BG) include (1) amount of carb; (2) type of carb (simple vs complex [and amount of fiber]); (3) what else is eaten with the food.   The American Heart  Assn recommends limiting ADDED sugar to 24 grams per day for women (37.5 g for men).  One teaspoon of sugar = 4 grams.    We have a very limited capacity to store protein.  This is why it's important to distribute our protein intake over at least 3 X day.    Bone health: Get some strength training at least 2 X wk.  Remember the importance of lower-body strength.  Strength is a HUGE factor in keeping [place] independent in older age.    Let me know if any Qs.

## 2022-11-20 ENCOUNTER — Encounter: Payer: Self-pay | Admitting: Podiatry

## 2022-11-20 ENCOUNTER — Ambulatory Visit (INDEPENDENT_AMBULATORY_CARE_PROVIDER_SITE_OTHER): Payer: Medicare Other | Admitting: Podiatry

## 2022-11-20 DIAGNOSIS — M722 Plantar fascial fibromatosis: Secondary | ICD-10-CM

## 2022-11-20 NOTE — Progress Notes (Signed)
She presents today for follow-up of her Planter fasciitis and capsulitis.  She states that she is doing much better and almost everything is completely better.  Objective: Vital signs are stable she is alert and oriented x 3 there is no further reproducible pain on palpation of the left foot plantar fascial area.  It is not warm to the touch it is not indurated and she has no pain on medial lateral compression of the calcaneus.  Assessment: Well-healing Planter fasciitis.  Plan: Follow-up with me on an as-needed basis.

## 2022-11-25 ENCOUNTER — Other Ambulatory Visit: Payer: Self-pay | Admitting: Family Medicine

## 2022-11-25 DIAGNOSIS — E039 Hypothyroidism, unspecified: Secondary | ICD-10-CM

## 2022-12-11 ENCOUNTER — Encounter: Payer: Self-pay | Admitting: Family Medicine

## 2022-12-15 ENCOUNTER — Other Ambulatory Visit: Payer: Medicare Other

## 2022-12-15 DIAGNOSIS — Z5181 Encounter for therapeutic drug level monitoring: Secondary | ICD-10-CM

## 2022-12-15 DIAGNOSIS — E782 Mixed hyperlipidemia: Secondary | ICD-10-CM | POA: Diagnosis not present

## 2022-12-16 ENCOUNTER — Encounter: Payer: Self-pay | Admitting: Family Medicine

## 2022-12-16 ENCOUNTER — Other Ambulatory Visit: Payer: Self-pay | Admitting: *Deleted

## 2022-12-16 DIAGNOSIS — E782 Mixed hyperlipidemia: Secondary | ICD-10-CM

## 2022-12-16 DIAGNOSIS — R7301 Impaired fasting glucose: Secondary | ICD-10-CM

## 2022-12-16 DIAGNOSIS — Z5181 Encounter for therapeutic drug level monitoring: Secondary | ICD-10-CM

## 2022-12-16 LAB — LIPID PANEL
Chol/HDL Ratio: 3.1 ratio (ref 0.0–4.4)
Cholesterol, Total: 139 mg/dL (ref 100–199)
HDL: 45 mg/dL (ref >39)
LDL Chol Calc (NIH): 66 mg/dL (ref 0–99)
Triglycerides: 163 mg/dL — ABNORMAL HIGH (ref 0–149)
VLDL Cholesterol Cal: 28 mg/dL (ref 5–40)

## 2022-12-16 LAB — HEPATIC FUNCTION PANEL
ALT: 26 IU/L (ref 0–32)
AST: 20 IU/L (ref 0–40)
Albumin: 4.4 g/dL (ref 3.8–4.8)
Alkaline Phosphatase: 49 IU/L (ref 44–121)
Bilirubin Total: 0.4 mg/dL (ref 0.0–1.2)
Bilirubin, Direct: <0.1 mg/dL (ref 0.00–0.40)
Total Protein: 6.8 g/dL (ref 6.0–8.5)

## 2022-12-16 MED ORDER — ATORVASTATIN CALCIUM 40 MG PO TABS: 40.0000 mg | ORAL_TABLET | Freq: Every day | ORAL | 1 refills | Status: DC

## 2023-01-22 ENCOUNTER — Other Ambulatory Visit: Payer: Self-pay | Admitting: Family Medicine

## 2023-01-22 ENCOUNTER — Encounter: Payer: Self-pay | Admitting: *Deleted

## 2023-01-22 ENCOUNTER — Other Ambulatory Visit: Payer: Self-pay

## 2023-01-22 DIAGNOSIS — I10 Essential (primary) hypertension: Secondary | ICD-10-CM

## 2023-01-22 MED ORDER — AMLODIPINE BESYLATE 2.5 MG PO TABS
2.5000 mg | ORAL_TABLET | Freq: Every evening | ORAL | 3 refills | Status: DC
Start: 2023-01-22 — End: 2024-01-20

## 2023-01-27 ENCOUNTER — Encounter: Payer: Self-pay | Admitting: *Deleted

## 2023-01-28 ENCOUNTER — Other Ambulatory Visit: Payer: Self-pay | Admitting: Family Medicine

## 2023-01-28 DIAGNOSIS — Z0000 Encounter for general adult medical examination without abnormal findings: Secondary | ICD-10-CM

## 2023-01-28 DIAGNOSIS — Z961 Presence of intraocular lens: Secondary | ICD-10-CM | POA: Diagnosis not present

## 2023-02-06 ENCOUNTER — Encounter: Payer: Self-pay | Admitting: Family Medicine

## 2023-02-10 ENCOUNTER — Other Ambulatory Visit: Payer: Medicare Other

## 2023-02-10 DIAGNOSIS — R7301 Impaired fasting glucose: Secondary | ICD-10-CM | POA: Diagnosis not present

## 2023-02-10 DIAGNOSIS — Z5181 Encounter for therapeutic drug level monitoring: Secondary | ICD-10-CM

## 2023-02-10 DIAGNOSIS — E782 Mixed hyperlipidemia: Secondary | ICD-10-CM | POA: Diagnosis not present

## 2023-02-10 LAB — LIPID PANEL
Cholesterol, Total: 124 mg/dL (ref 100–199)
LDL Chol Calc (NIH): 44 mg/dL (ref 0–99)

## 2023-02-10 LAB — HEPATIC FUNCTION PANEL
ALT: 23 IU/L (ref 0–32)
AST: 22 IU/L (ref 0–40)
Alkaline Phosphatase: 46 IU/L (ref 44–121)
Bilirubin Total: 0.4 mg/dL (ref 0.0–1.2)
Total Protein: 6.8 g/dL (ref 6.0–8.5)

## 2023-02-11 ENCOUNTER — Encounter: Payer: Self-pay | Admitting: Family Medicine

## 2023-02-11 ENCOUNTER — Other Ambulatory Visit: Payer: Self-pay | Admitting: *Deleted

## 2023-02-11 LAB — HEPATIC FUNCTION PANEL: Albumin: 4.6 g/dL (ref 3.8–4.8)

## 2023-02-11 LAB — LIPID PANEL
Chol/HDL Ratio: 2.2 ratio (ref 0.0–4.4)
HDL: 57 mg/dL (ref >39)
Triglycerides: 132 mg/dL (ref 0–149)
VLDL Cholesterol Cal: 23 mg/dL (ref 5–40)

## 2023-02-11 LAB — GLUCOSE, RANDOM: Glucose: 107 mg/dL — ABNORMAL HIGH (ref 70–99)

## 2023-02-11 MED ORDER — ATORVASTATIN CALCIUM 40 MG PO TABS: 40.0000 mg | ORAL_TABLET | Freq: Every day | ORAL | 0 refills | Status: DC

## 2023-03-16 ENCOUNTER — Ambulatory Visit
Admission: RE | Admit: 2023-03-16 | Discharge: 2023-03-16 | Disposition: A | Discharge status: Home / Self Care | Payer: Medicare Other | Source: Ambulatory Visit | Attending: Family Medicine | Admitting: Family Medicine

## 2023-03-16 DIAGNOSIS — Z1231 Encounter for screening mammogram for malignant neoplasm of breast: Secondary | ICD-10-CM | POA: Diagnosis not present

## 2023-03-16 DIAGNOSIS — Z0000 Encounter for general adult medical examination without abnormal findings: Secondary | ICD-10-CM

## 2023-03-28 ENCOUNTER — Other Ambulatory Visit: Payer: Self-pay | Admitting: Family Medicine

## 2023-03-28 DIAGNOSIS — E039 Hypothyroidism, unspecified: Secondary | ICD-10-CM

## 2023-03-30 ENCOUNTER — Telehealth: Payer: Self-pay | Admitting: Internal Medicine

## 2023-03-30 MED ORDER — SYNTHROID 50 MCG PO TABS
ORAL_TABLET | ORAL | 0 refills | Status: DC
Start: 2023-03-30 — End: 2023-06-29

## 2023-03-30 NOTE — Telephone Encounter (Signed)
Prolia Estimated cost is $0. Deductible has been met. Pt is scheduled for 04/20/2023. I will order from physician services

## 2023-04-14 ENCOUNTER — Encounter: Payer: Self-pay | Admitting: Family Medicine

## 2023-04-15 DIAGNOSIS — L821 Other seborrheic keratosis: Secondary | ICD-10-CM | POA: Diagnosis not present

## 2023-04-15 DIAGNOSIS — L720 Epidermal cyst: Secondary | ICD-10-CM | POA: Diagnosis not present

## 2023-04-15 DIAGNOSIS — D1801 Hemangioma of skin and subcutaneous tissue: Secondary | ICD-10-CM | POA: Diagnosis not present

## 2023-04-15 DIAGNOSIS — D2261 Melanocytic nevi of right upper limb, including shoulder: Secondary | ICD-10-CM | POA: Diagnosis not present

## 2023-04-15 DIAGNOSIS — D2262 Melanocytic nevi of left upper limb, including shoulder: Secondary | ICD-10-CM | POA: Diagnosis not present

## 2023-04-15 DIAGNOSIS — L820 Inflamed seborrheic keratosis: Secondary | ICD-10-CM | POA: Diagnosis not present

## 2023-04-20 ENCOUNTER — Other Ambulatory Visit (INDEPENDENT_AMBULATORY_CARE_PROVIDER_SITE_OTHER): Payer: Medicare Other

## 2023-04-20 ENCOUNTER — Telehealth: Payer: Self-pay | Admitting: Family Medicine

## 2023-04-20 DIAGNOSIS — Z5181 Encounter for therapeutic drug level monitoring: Secondary | ICD-10-CM

## 2023-04-20 DIAGNOSIS — M810 Age-related osteoporosis without current pathological fracture: Secondary | ICD-10-CM | POA: Diagnosis not present

## 2023-04-20 DIAGNOSIS — E039 Hypothyroidism, unspecified: Secondary | ICD-10-CM

## 2023-04-20 DIAGNOSIS — I10 Essential (primary) hypertension: Secondary | ICD-10-CM

## 2023-04-20 DIAGNOSIS — R7301 Impaired fasting glucose: Secondary | ICD-10-CM

## 2023-04-20 MED ORDER — DENOSUMAB 60 MG/ML ~~LOC~~ SOSY
60.0000 mg | PREFILLED_SYRINGE | Freq: Once | SUBCUTANEOUS | Status: AC
Start: 2023-04-20 — End: 2023-04-20
  Administered 2023-04-20: 60 mg via SUBCUTANEOUS

## 2023-04-20 NOTE — Telephone Encounter (Signed)
Pt has a wellness visit on 05/04/2023 and she would like to come in before and have her bloodwork done before if that is ok  [place]

## 2023-04-21 NOTE — Telephone Encounter (Signed)
Orders entered. Okay to schedule fasting lab visit prior to her 9/23 wellness visit.

## 2023-04-22 ENCOUNTER — Other Ambulatory Visit: Payer: Self-pay

## 2023-04-22 DIAGNOSIS — I10 Essential (primary) hypertension: Secondary | ICD-10-CM

## 2023-04-22 MED ORDER — LOSARTAN POTASSIUM-HCTZ 100-12.5 MG PO TABS
ORAL_TABLET | ORAL | 3 refills | Status: DC
Start: 2023-04-22 — End: 2024-05-06

## 2023-04-23 ENCOUNTER — Ambulatory Visit: Payer: Medicare Other | Admitting: Family Medicine

## 2023-04-27 ENCOUNTER — Encounter: Payer: Self-pay | Admitting: Family Medicine

## 2023-04-29 ENCOUNTER — Other Ambulatory Visit: Payer: Medicare Other

## 2023-04-29 DIAGNOSIS — I10 Essential (primary) hypertension: Secondary | ICD-10-CM

## 2023-04-29 DIAGNOSIS — Z5181 Encounter for therapeutic drug level monitoring: Secondary | ICD-10-CM

## 2023-04-29 DIAGNOSIS — E039 Hypothyroidism, unspecified: Secondary | ICD-10-CM

## 2023-04-29 DIAGNOSIS — R7301 Impaired fasting glucose: Secondary | ICD-10-CM | POA: Diagnosis not present

## 2023-04-30 LAB — CBC WITH DIFFERENTIAL/PLATELET
Basophils Absolute: 0 10*3/uL (ref 0.0–0.2)
Basos: 0 %
EOS (ABSOLUTE): 0.3 10*3/uL (ref 0.0–0.4)
Eos: 3 %
Hematocrit: 36.6 % (ref 34.0–46.6)
Hemoglobin: 12.2 g/dL (ref 11.1–15.9)
Immature Grans (Abs): 0 10*3/uL (ref 0.0–0.1)
Immature Granulocytes: 0 %
Lymphocytes Absolute: 3 10*3/uL (ref 0.7–3.1)
Lymphs: 33 %
MCH: 33.7 pg — ABNORMAL HIGH (ref 26.6–33.0)
MCHC: 33.3 g/dL (ref 31.5–35.7)
MCV: 101 fL — ABNORMAL HIGH (ref 79–97)
Monocytes Absolute: 0.8 10*3/uL (ref 0.1–0.9)
Monocytes: 9 %
Neutrophils Absolute: 4.9 10*3/uL (ref 1.4–7.0)
Neutrophils: 55 %
Platelets: 221 10*3/uL (ref 150–450)
RBC: 3.62 x10E6/uL — ABNORMAL LOW (ref 3.77–5.28)
RDW: 12.6 % (ref 11.7–15.4)
WBC: 8.9 10*3/uL (ref 3.4–10.8)

## 2023-04-30 LAB — COMPREHENSIVE METABOLIC PANEL
ALT: 23 IU/L (ref 0–32)
AST: 21 IU/L (ref 0–40)
Albumin: 4.3 g/dL (ref 3.8–4.8)
Alkaline Phosphatase: 50 IU/L (ref 44–121)
BUN/Creatinine Ratio: 22 (ref 12–28)
BUN: 19 mg/dL (ref 8–27)
Bilirubin Total: 0.5 mg/dL (ref 0.0–1.2)
CO2: 25 mmol/L (ref 20–29)
Calcium: 9.7 mg/dL (ref 8.7–10.3)
Chloride: 103 mmol/L (ref 96–106)
Creatinine, Ser: 0.86 mg/dL (ref 0.57–1.00)
Globulin, Total: 2.5 g/dL (ref 1.5–4.5)
Glucose: 106 mg/dL — ABNORMAL HIGH (ref 70–99)
Potassium: 4 mmol/L (ref 3.5–5.2)
Sodium: 143 mmol/L (ref 134–144)
Total Protein: 6.8 g/dL (ref 6.0–8.5)
eGFR: 70 mL/min/{1.73_m2} (ref >59)

## 2023-04-30 LAB — HEMOGLOBIN A1C
Est. average glucose Bld gHb Est-mCnc: 131 mg/dL
Hgb A1c MFr Bld: 6.2 % — ABNORMAL HIGH (ref 4.8–5.6)

## 2023-04-30 LAB — TSH: TSH: 3.79 u[IU]/mL (ref 0.450–4.500)

## 2023-05-03 NOTE — Progress Notes (Unsigned)
No chief complaint on file.   Dorothy Rojas is a 77 y.o. female who presents for annual wellness visit and follow-up on chronic medical conditions.  See below for labs done prior to visit.   Hypertension follow-up: She is compliant with losartan-HCT and amlodipine. These are prescribed by Dr. Jacinto Halim. BP's are running   BP Readings from Last 3 Encounters:  10/27/22 109/62  09/30/22 130/70  08/20/22 122/66     Denies lightheadedness, syncope, headaches. Denies side effects of medications. She denies exertional chest pain.  She previously reported some DOE, worse when walking outside in the humidity.  Doesn't have any problems when walking on the indoor track at [employer] or other activities at the gym. Sometimes with the cardio in the [employer] classes she notes she is breathing heavier.  She doesn't need to stop and recover, can complete the activity.  She is at the gym 5 days/week.   She has evaluation for DOE with Dr. Jacinto Halim, had stress test and echo in 2021 (low risk).  She sees cardiologist yearly. She has known atherosclerosis.  There is FHx of CAD, no personal history.   Hyperlipidemia follow-up:  Patient is reportedly following a low-fat, low cholesterol diet.  Compliant with medications (atorvastatin 40mg ) and denies medication side effects.  She was switched from simvastatin to atorvastatin (due to potential interaction with amlodipine), with atorvastatin dose being increased to 40mg  in May 2024, to help get TG to goal.  Lipids were at goal on last check in July.  She remains very careful with her diet--limits carbs, cut back to 2 dark chocolate kisses daily. Lab Results  Component Value Date   CHOL 124 02/10/2023   HDL 57 02/10/2023   LDLCALC 44 02/10/2023   TRIG 132 02/10/2023   CHOLHDL 2.2 02/10/2023     Impaired fasting glucose.  Prior A1c was 6.1% in 09/2022.  She continues to avoid sugar and limit carbs in her diet. 2 dark chocolate candy kisses after lunch or  dinner. 1-2 glasses of wine (usually just one glass) on Friday and Saturday, sometimes on Sunday    GERD:  Has some reflux related to her diet.  Occasionally has symptoms related to [place] food, [employer] (though not recently). Nexium OTC works well, but hasn't needed any in a long time.  Denies dysphagia.   H/o DVT-- s/p 6 months of anticoagulation with xarelto. She was discharged from the care of hematologist.  If any recurrence, will need lifelong treatment.    She is taking 81mg  of ASA daily. (factor V Leiden heterozygous). No other blood thinning agents were recommended.  Denies any swelling or pain in LE's.  Denies any bleeding/bruising.  Denies any abdominal pain.   MNG--denies symptoms/dysphagia or changes to her neck.  She had benign biopsies in 2020. Last [place] was in 01/2020, no nodules meeting criteria for biopsy, heterogenous thyroid.   Hypothyroidism:  She remains on Synthroid . She reports compliance with medication, taking on an empty stomach, separate from other medications.  She denies changes to hair/skin/bowels/energy.  Slight dry skin (chronic).   Osteoporosis:  She was started on Boniva in May 2017 (DEXA done 12/10/15 showed T -2.5 at R fem neck). She tolerated it without side effects, but didn't show any improvement (01/2018 T-2.3 at R fem neck, 01/2020 T-2.5 at R fem neck). She was then changed from Boniva to Prolia injections, first injection 02/2020, last was 04/20/23. She denies side effects. Last DEXA (03/2022)-- T-2.5 R fem neck, -2.0 L  wrist Vitamin D-OH level was 51.5 in 03/2022.  She takes Calcium with D once or twice daily (usually once).  Gets regular weight-bearing exercise at the gym at least 2x/week.    She has chronic microscopic hematuria and h/o frequent UTI's.  She no longer takes prophylactic antibiotics for UTI's, and hasn't had any recurrent infections.  She is s/p Macroplastique procedure x 2, last of which was in September 2016. She had worsening  incontinence, had PT at [facility], which helped. Hasn't been doing her home exercises. She was discharged from Dr. McDiarmid's care. Now only has leakage with sneezing, coughing.  No leakage with exercising. Sometimes when getting out of recliner.    Aortic atherosclerosis--compliant with statin, denies side effects.  Immunization History  Administered Date(s) Administered   Fluad Quad(high Dose 65+) 04/12/2019, 04/30/2022   Influenza Whole 04/09/2011   Influenza, High Dose Seasonal PF 05/08/2014, 05/01/2015, 05/26/2016, 05/22/2017, 06/23/2018, 04/16/2021   Influenza,inj,Quad PF,6+ Mos 04/14/2013   Influenza-Unspecified 05/22/2017, 05/04/2020, 04/16/2021   Moderna SARS-COV2 Booster Vaccination 05/14/2022   PFIZER(Purple Top)SARS-COV-2 Vaccination 09/15/2019, 10/10/2019, 05/05/2020, 11/17/2020   Pfizer Covid-19 Vaccine Bivalent Booster 29yrs & up 07/08/2021   Pneumococcal Conjugate-13 11/01/2014, 05/11/2018   Pneumococcal Polysaccharide-23 04/09/2011, 03/25/2021   Rsv, Bivalent, Protein Subunit Rsvpref,pf Verdis Frederickson) 05/14/2022   Td 08/07/2006   Tdap 11/01/2014   Zoster Recombinant(Shingrix) 07/28/2019, 11/03/2019   Zoster, Live 10/25/2008   Last Pap smear: can't recall. S/p hysterectomy for benign reasons   Last mammogram: 03/2023 Last colonoscopy:  09/2020, tubular adenomas.  Recheck due 5 years. Last DEXA: 03/2022 T-2.5 R fem neck, -2.0 L wrist.  (Prior was 01/2020, T-2.5 at R femur neck). Ophtho: yearly Dentist: regularly, with dentist and periodontist every 3-4 months   Exercise:  Uses weights in the [employer] class (1-2x/week). Uses machines at the gym 2x/week (40 mins). Does treadmill, bike, walks, pickleball.   Patient Care Team: Joselyn Arrow, MD as PCP - General (Family Medicine) Verner Chol, [facility] (Inactive) as Pharmacist (Pharmacist) Linna Darner, RD as Dietitian [facility] Medicine) Dr. Jacinto Halim (cardiology) Dr. McDiarmid (urology)--released from his care Dr. Nile Riggs (ophtho)    Dr. Marina Goodell (GI) Dr. Sharma Covert and Delane Ginger (dentist)   Dr. Cheral Almas (periodontist) Dr. Thad Ranger (orthodontist)--Invisalign Dr. Rosie Fate (hematologist)--released from his care Dr. Charlann Boxer (ortho-knee) Dr. Rennis Chris (ortho--shoulder) Dr. Karlyn Agee (dermatologist) Dr. Franky Macho (neurosurgeon)  Depression Screening: Flowsheet Row Office Visit from 04/03/2022 in [facility]  PHQ-2 Total Score 0        Falls screen:     09/30/2022   10:47 AM 04/30/2022    2:15 PM 04/03/2022    9:35 AM 09/25/2021   10:43 AM 06/17/2021   11:40 AM  Fall Risk   Falls in the past year? 1 1 0 0 1  Number falls in past yr: 0 0 0 0 0  Comment 04/28/22-pickle ball 04/28/22-fell playing pickle ball(foot got stuck on court and she fell).   fell after her back surgery-tripped up door jam  Injury with Fall? 0 0 0 0 0  Comment brusing to head, left eyebrow and mouth facial bruising     Risk for fall due to : No Fall Risks History of fall(s) History of fall(s) No Fall Risks History of fall(s)  Follow up Falls evaluation completed Falls evaluation completed Falls evaluation completed Falls evaluation completed Falls evaluation completed     Functional Status Survey:           End of Life Discussion:  Patient has a living will and  medical power of attorney, scanned    PMH, PSH, SH and FH were reviewed and updated    ROS:The patient denies anorexia, fever, headaches, vision changes, ear pain, sore throat, breast concerns, chest pain, palpitations, syncope, cough, swelling, nausea, vomiting, diarrhea, abdominal pain, melena, hematochezia, hematuria, dysuria, vaginal bleeding, discharge, odor or itch, genital lesions, numbness, tingling, weakness, tremor, suspicious skin lesions, depression, anxiety, abnormal bleeding or enlarged lymph nodes.   Intermittent vertigo, usually only when she sits up quickly from laying down, short-lived. Urinary incontinence stable per HPI. Some hearing loss/trouble, especially in crowds,  restaurants, unchanged  DOE very mild, related to humidity, and with long, steep hills. Unchanged. Some hoarseness and throat-clearing (chronic), thinks related to allergies, has PND. Pain in R index finger (DIP), mainly at night.  ?R thumb pain (injury 04/2022) HA L posterior scalp (prior injury)?   PHYSICAL EXAM:  LMP  (LMP Unknown)   Wt Readings from Last 3 Encounters:  11/17/22 124 lb 6.4 oz (56.4 kg)  10/27/22 130 lb 12.8 oz (59.3 kg)  09/30/22 133 lb (60.3 kg)    General Appearance:    Alert, cooperative, no distress, appears stated age.   Head:    Normocephalic, without obvious abnormality, atraumatic     Eyes:    PERRL, conjunctiva/corneas clear, EOM's intact, fundi benign     Ears:    Normal TM's and external ear canals     Nose:    No drainage or sinus tenderness  Throat:    Normal mucosa, no lesions  Neck:    Supple, no lymphadenopathy; thyroid: mild enlargement, no nodules; no tenderness/nodules; no carotid bruit or JVD     Back:    Spine nontender, no curvature, ROM normal, no CVA tenderness. WHSS midline lower back   Lungs:    Clear to auscultation bilaterally without wheezes, rales or ronchi; respirations unlabored     Chest Wall:    No tenderness or deformity     Heart:    Regular rate and rhythm, S1 and S2 normal, no murmur, rub or gallop     Breast Exam:    No tenderness, masses, or nipple discharge. Nipples are inverted bilaterally, L>R (chronic, per pt). No axillary lymphadenopathy.     Abdomen:    Soft, non-tender, nondistended, normoactive bowel sounds, no masses, no hepatosplenomegaly     Genitalia:    Declined by patient   Rectal:    Declined by patient  Extremities:    No clubbing, cyanosis or edema. WHSS L knee. Some arthritic changes to her DIP's, slight deformity to the R index distal phalanx.   Pulses:    2+ and symmetric all extremities     Skin:    Skin color, texture, turgor normal, no rashes or suspicious lesions.  Many SK's.   Lymph nodes:     Cervical, supraclavicular, inguinal and axillary nodes normal    Neurologic:    Normal strength, sensation and gait; reflexes 2+ and symmetric throughout                       Psych:   Normal mood, affect, hygiene and grooming    ***UPDATE IF PELVIC/RECTAL DONE    Lab Results  Component Value Date   HGBA1C 6.2 (H) 04/29/2023  ` Fasting glucose 106    Chemistry      Component Value Date/Time   NA 143 04/29/2023 0806   NA 144 01/26/2014 0809   K 4.0 04/29/2023 0806   K  4.6 01/26/2014 0809   CL 103 04/29/2023 0806   CO2 25 04/29/2023 0806   CO2 29 01/26/2014 0809   BUN 19 04/29/2023 0806   BUN 18.5 01/26/2014 0809   CREATININE 0.86 04/29/2023 0806   CREATININE 0.92 12/04/2016 0713   CREATININE 0.8 01/26/2014 0809      Component Value Date/Time   CALCIUM 9.7 04/29/2023 0806   CALCIUM 9.4 01/26/2014 0809   ALKPHOS 50 04/29/2023 0806   ALKPHOS 57 10/13/2013 0910   AST 21 04/29/2023 0806   AST 21 10/13/2013 0910   ALT 23 04/29/2023 0806   ALT 23 10/13/2013 0910   BILITOT 0.5 04/29/2023 0806   BILITOT 0.42 10/13/2013 0910     Lab Results  Component Value Date   WBC 8.9 04/29/2023   HGB 12.2 04/29/2023   HCT 36.6 04/29/2023   MCV 101 (H) 04/29/2023   PLT 221 04/29/2023   Lab Results  Component Value Date   TSH 3.790 04/29/2023    ASSESSMENT/PLAN:  ?willing to have pelvic/rectal today? (Declined last year) Flu, COVID  Discussed monthly self breast exams and yearly mammograms; at least 30 minutes of aerobic activity at least 5 days/week and weight-bearing exercise 2x/week; proper sunscreen use reviewed; healthy diet, including goals of calcium and vitamin D intake and alcohol recommendations (less than or equal to 1 drink/day) reviewed; regular seatbelt use; changing batteries in smoke detectors.  Immunization recommendations discussed-- continue yearly high dose flu shots.  COVID booster Colonoscopy recommendations reviewed, UTD.  MOST form reviewed, unchanged.  Full Code, Full Care   F/u 6 mos for med check.   Medicare Attestation I have personally reviewed: The patient's medical and social history Their use of alcohol, tobacco or illicit drugs Their current medications and supplements The patient's functional ability including ADLs,fall risks, home safety risks, cognitive, and hearing and visual impairment Diet and physical activities Evidence for depression or mood disorders  The patient's weight, height, BMI have been recorded in the chart.  I have made referrals, counseling, and provided education to the patient based on review of the above and I have provided the patient with a written personalized care plan for preventive services.

## 2023-05-04 ENCOUNTER — Ambulatory Visit (INDEPENDENT_AMBULATORY_CARE_PROVIDER_SITE_OTHER): Payer: Medicare Other | Admitting: Family Medicine

## 2023-05-04 ENCOUNTER — Encounter: Payer: Self-pay | Admitting: Family Medicine

## 2023-05-04 VITALS — BP 128/64 | HR 68 | Ht 64.0 in | Wt 131.2 lb

## 2023-05-04 DIAGNOSIS — M810 Age-related osteoporosis without current pathological fracture: Secondary | ICD-10-CM

## 2023-05-04 DIAGNOSIS — E039 Hypothyroidism, unspecified: Secondary | ICD-10-CM

## 2023-05-04 DIAGNOSIS — R7301 Impaired fasting glucose: Secondary | ICD-10-CM | POA: Diagnosis not present

## 2023-05-04 DIAGNOSIS — Z5181 Encounter for therapeutic drug level monitoring: Secondary | ICD-10-CM

## 2023-05-04 DIAGNOSIS — I700 Atherosclerosis of aorta: Secondary | ICD-10-CM

## 2023-05-04 DIAGNOSIS — D6851 Activated protein C resistance: Secondary | ICD-10-CM | POA: Diagnosis not present

## 2023-05-04 DIAGNOSIS — I10 Essential (primary) hypertension: Secondary | ICD-10-CM

## 2023-05-04 DIAGNOSIS — E782 Mixed hyperlipidemia: Secondary | ICD-10-CM

## 2023-05-04 DIAGNOSIS — H9193 Unspecified hearing loss, bilateral: Secondary | ICD-10-CM

## 2023-05-04 DIAGNOSIS — Z23 Encounter for immunization: Secondary | ICD-10-CM | POA: Diagnosis not present

## 2023-05-04 DIAGNOSIS — E559 Vitamin D deficiency, unspecified: Secondary | ICD-10-CM

## 2023-05-04 DIAGNOSIS — E7800 Pure hypercholesterolemia, unspecified: Secondary | ICD-10-CM

## 2023-05-04 DIAGNOSIS — Z0000 Encounter for general adult medical examination without abnormal findings: Secondary | ICD-10-CM | POA: Diagnosis not present

## 2023-05-04 MED ORDER — ATORVASTATIN CALCIUM 40 MG PO TABS
40.0000 mg | ORAL_TABLET | Freq: Every day | ORAL | 1 refills | Status: DC
Start: 2023-05-04 — End: 2023-11-03

## 2023-05-04 NOTE — Patient Instructions (Signed)
  HEALTH MAINTENANCE RECOMMENDATIONS:  It is recommended that you get at least 30 minutes of aerobic exercise at least 5 days/week (for weight loss, you may need as much as 60-90 minutes). This can be any activity that gets your heart rate up. This can be divided in 10-15 minute intervals if needed, but try and build up your endurance at least once a week.  Weight bearing exercise is also recommended twice weekly.  Eat a healthy diet with lots of vegetables, fruits and fiber.  "Colorful" foods have a lot of vitamins (ie green vegetables, tomatoes, red peppers, etc).  Limit sweet tea, regular sodas and alcoholic beverages, all of which has a lot of calories and sugar.  Up to 1 alcoholic drink daily may be beneficial for women (unless trying to lose weight, watch sugars).  Drink a lot of water.  Calcium recommendations are 1200-1500 mg daily (1500 mg for postmenopausal women or women without ovaries), and vitamin D 1000 IU daily.  This should be obtained from diet and/or supplements (vitamins), and calcium should not be taken all at once, but in divided doses.  Monthly self breast exams and yearly mammograms for women over the age of 17 is recommended.  Sunscreen of at least SPF 30 should be used on all sun-exposed parts of the skin when outside between the hours of 10 am and 4 pm (not just when at beach or pool, but even with exercise, golf, tennis, and yard work!)  Use a sunscreen that says "broad spectrum" so it covers both UVA and UVB rays, and make sure to reapply every 1-2 hours.  Remember to change the batteries in your smoke detectors when changing your clock times in the spring and fall. Carbon monoxide detectors are recommended for your home.  Use your seat belt every time you are in a car, and please drive safely and not be distracted with cell phones and texting while driving.   Dorothy Rojas , Thank you for taking time to come for your Medicare Wellness Visit. I appreciate your ongoing  commitment to your health goals. Please review the following plan we discussed and let me know if I can assist you in the future.   This is a list of the screening recommended for you and due dates:  Health Maintenance  Topic Date Due   COVID-19 Vaccine (8 - 2023-24 season) 09/03/2023   Medicare Annual Wellness Visit  05/03/2024   DTaP/Tdap/Td vaccine (3 - Td or Tdap) 10/31/2024   Colon Cancer Screening  10/01/2025   Pneumonia Vaccine  Completed   Flu Shot  Completed   DEXA scan (bone density measurement)  Completed   Hepatitis C Screening  Completed   Zoster (Shingles) Vaccine  Completed   HPV Vaccine  Aged Out

## 2023-05-08 DIAGNOSIS — M542 Cervicalgia: Secondary | ICD-10-CM | POA: Diagnosis not present

## 2023-05-08 DIAGNOSIS — M25511 Pain in right shoulder: Secondary | ICD-10-CM | POA: Diagnosis not present

## 2023-05-11 ENCOUNTER — Encounter: Payer: Self-pay | Admitting: Family Medicine

## 2023-05-12 ENCOUNTER — Encounter: Payer: Self-pay | Admitting: Family Medicine

## 2023-05-23 ENCOUNTER — Encounter: Payer: Self-pay | Admitting: Family Medicine

## 2023-05-25 ENCOUNTER — Encounter: Payer: Self-pay | Admitting: Family Medicine

## 2023-05-25 ENCOUNTER — Telehealth (INDEPENDENT_AMBULATORY_CARE_PROVIDER_SITE_OTHER): Payer: Medicare Other | Admitting: Family Medicine

## 2023-05-25 VITALS — BP 116/58 | HR 77 | Temp 99.3°F | Ht 64.0 in | Wt 128.4 lb

## 2023-05-25 DIAGNOSIS — U071 COVID-19: Secondary | ICD-10-CM | POA: Diagnosis not present

## 2023-05-25 MED ORDER — NIRMATRELVIR/RITONAVIR (PAXLOVID)TABLET
3.0000 | ORAL_TABLET | Freq: Two times a day (BID) | ORAL | 0 refills | Status: AC
Start: 2023-05-25 — End: 2023-05-30

## 2023-05-25 MED ORDER — DM-GUAIFENESIN ER 30-600 MG PO TB12: 1.0000 | ORAL_TABLET | Freq: Two times a day (BID) | ORAL | 0 refills | Status: DC

## 2023-05-25 NOTE — Progress Notes (Signed)
Start time: 9:32 End time: 9:52  Virtual Visit via Video Note  I connected with Dorothy Rojas on 05/25/23 by a video enabled telemedicine application and verified that I am speaking with the correct person using two identifiers.  Location: Patient: home Provider: office   I discussed the limitations of evaluation and management by telemedicine and the availability of in person appointments. The patient expressed understanding and agreed to proceed.  History of Present Illness:  Chief Complaint  Patient presents with   Covid Positive    VIRTUAL positive covid Friday morning. Symptoms started Thursday with sinus drainage in the back of her throat. Did a home test Fri and it was pos, so went to [facility] Fri for confirmation test. Still has some sinus drainage, cough is getting better. Scratchy ST is getting better. Had a temp of 100.7.    10/10 she started with PND/sore throat. 10/12 (Saturday, not Friday as stated above) she developed LG fever (100.7), and increasing head congestion.  She had + home test 10/12, confirmed at [facility].  Denies headaches. She isn't coughing as much as she had been.  Nasal drainage is clear, no purulent drainage.  No sinus pain or ear pain. Gets a little winded with stairs. Denies chest pain.  She is taking Mucinex Fast Max. Also has Mucinex sinus max at home (hasn't taken).  Had been at a party the weekend of 10/4-6. Husband is also sick  Had last COVID vaccine 9/23.   PMH, PSH, SH reviewed  Outpatient Encounter Medications as of 05/25/2023  Medication Sig Note   acetaminophen (TYLENOL) 500 MG tablet Take 1,000 mg by mouth every 6 (six) hours as needed. 05/25/2023: Took 100mg  last night 10pm   amLODipine (NORVASC) 2.5 MG tablet Take 1 tablet (2.5 mg total) by mouth every evening.    aspirin EC 81 MG tablet Take 81 mg by mouth daily. Swallow whole.    atorvastatin (LIPITOR) 40 MG tablet Take 1 tablet (40 mg total) by mouth daily.     Calcium Carbonate-Vit D-Min (CALCIUM 600+D PLUS MINERALS) 600-400 MG-UNIT TABS Take 1 capsule by mouth daily at 12 noon.    Cholecalciferol (VITAMIN D) 50 MCG (2000 UT) tablet Take 2,000 Units by mouth daily.    denosumab (PROLIA) 60 MG/ML SOSY injection Inject 60 mg into the skin every 6 (six) months.    losartan-hydrochlorothiazide (HYZAAR) 100-12.5 MG tablet TAKE ONE TABLET BY MOUTH ONCE DAILY. keep appointment FOR additional refills.    Multiple Vitamins-Minerals (MULTIVITAMIN WITH MINERALS) tablet Take 1 tablet by mouth daily.    Phenylephrine-DM-GG-APAP (MUCINEX FAST-MAX COLD FLU) 5-10-200-325 MG/10ML LIQD Take 20 mLs by mouth as needed. 05/25/2023: Last dose last night   SYNTHROID 50 MCG tablet TAKE 1 EVERY MORNING ON AN EMPTY STOMACH PRIOR TO ANY OTHER MEDICATIONS FOR HYPOTHYROIDISM    vitamin C (ASCORBIC ACID) 500 MG tablet Take 500 mg by mouth daily.    No facility-administered encounter medications on file as of 05/25/2023.   Allergies  Allergen Reactions   Naprosyn [Naproxen] Rash    ROS: fever and URI symptoms per HPI. No n/v/d, bleeding, bruising, rash. Mild DOE, no chest pain, no edema. Slight change in taste initially, resolved.    Observations/Objective:  BP (!) 116/58   Pulse 77   Temp 99.3 F (37.4 C) (Oral)   Ht 5\' 4"  (1.626 m)   Wt 128 lb 6.4 oz (58.2 kg)   LMP  (LMP Unknown)   SpO2 95%   BMI 22.04 kg/m  Well-appearing, mildly congested female, in good spirits. She is alert and oriented.  EOMI, cranial nerves grossly normal. She is speaking easily and comfortably during visit. Exam is limited due to the virtual nature of the visit.   Assessment and Plan:  COVID-19 virus infection - Plan: nirmatrelvir/ritonavir (PAXLOVID) 20 x 150 MG & 10 x 100MG  TABS  Stay well hydrated. Stop taking the atorvastatin for the next 5 days, while on the Paxlovid. You can start the Paxlovid this evening.  You aren't supposed to take phenylephrine (in both of the  Mucinex meds that you have at home), as this can raise your blood pressure. Instead, I recommend Mucinex DM 12 hours tablets, taken twice daily. If you develop sinus pain, try sinus rinses (neti-pot or sinus rinse kit).  Stay in isolation until you are fever-free for 24 hours (without taking anything tylenol or anti-inflammatories which can lower your temperature), AND your respiratory symptoms are significantly better. Wear a mask for the next 5 days, once you finish isolating.  If you have persistent high fever, worsening shortness of breath, pain with breathing, chest pain, leg swelling and pain or any other new/concerning symptoms, please get re-evaluated.    Follow Up Instructions:    I discussed the assessment and treatment plan with the patient. The patient was provided an opportunity to ask questions and all were answered. The patient agreed with the plan and demonstrated an understanding of the instructions.   The patient was advised to call back or seek an in-person evaluation if the symptoms worsen or if the condition fails to improve as anticipated.  I spent 24 minutes dedicated to the care of this patient, including pre-visit review of records, face to face time, post-visit ordering of testing and documentation.    Lavonda Jumbo, MD

## 2023-05-25 NOTE — Patient Instructions (Signed)
Stay well hydrated. Stop taking the atorvastatin for the next 5 days, while on the Paxlovid. You can start the Paxlovid this evening.  You aren't supposed to take phenylephrine (in both of the Mucinex meds that you have at home), as this can raise your blood pressure. Instead, I recommend Mucinex DM 12 hours tablets, taken twice daily. If you develop sinus pain, try sinus rinses (neti-pot or sinus rinse kit).  Stay in isolation until you are fever-free for 24 hours (without taking anything tylenol or anti-inflammatories which can lower your temperature), AND your respiratory symptoms are significantly better. Wear a mask for the next 5 days, once you finish isolating.  If you have persistent high fever, worsening shortness of breath, pain with breathing, chest pain, leg swelling and pain or any other new/concerning symptoms, please get re-evaluated.

## 2023-06-04 ENCOUNTER — Encounter: Payer: Self-pay | Admitting: Family Medicine

## 2023-06-22 DIAGNOSIS — K219 Gastro-esophageal reflux disease without esophagitis: Secondary | ICD-10-CM | POA: Diagnosis not present

## 2023-06-29 ENCOUNTER — Other Ambulatory Visit: Payer: Self-pay | Admitting: Family Medicine

## 2023-06-29 DIAGNOSIS — E039 Hypothyroidism, unspecified: Secondary | ICD-10-CM

## 2023-06-30 MED ORDER — SYNTHROID 50 MCG PO TABS: ORAL_TABLET | ORAL | 1 refills | Status: DC

## 2023-07-03 ENCOUNTER — Encounter: Payer: Self-pay | Admitting: Family Medicine

## 2023-07-16 ENCOUNTER — Encounter: Payer: Self-pay | Admitting: Family Medicine

## 2023-07-31 DIAGNOSIS — M25511 Pain in right shoulder: Secondary | ICD-10-CM | POA: Diagnosis not present

## 2023-07-31 DIAGNOSIS — M542 Cervicalgia: Secondary | ICD-10-CM | POA: Diagnosis not present

## 2023-08-13 ENCOUNTER — Telehealth: Payer: Self-pay | Admitting: Internal Medicine

## 2023-08-13 DIAGNOSIS — M810 Age-related osteoporosis without current pathological fracture: Secondary | ICD-10-CM

## 2023-08-13 MED ORDER — DENOSUMAB 60 MG/ML ~~LOC~~ SOSY
60.0000 mg | PREFILLED_SYRINGE | Freq: Once | SUBCUTANEOUS | Status: AC
Start: 2023-10-19 — End: 2023-10-19
  Administered 2023-10-19: 60 mg via SUBCUTANEOUS

## 2023-08-13 NOTE — Telephone Encounter (Signed)
 See [facility] referral  Due on or after 10/19/23

## 2023-08-25 ENCOUNTER — Telehealth: Payer: Self-pay | Admitting: Cardiology

## 2023-08-25 ENCOUNTER — Other Ambulatory Visit: Payer: Self-pay

## 2023-08-25 NOTE — Telephone Encounter (Signed)
 Pt called to report that she has been having SOB with minimal exertion... it has been happening over the past week.  She says she feels well at rest, no cough, no peripheral edema, and no chest pain. But, she is worried since it is new for her.   I am unable to make her an appt until 09/2023... I will send to Dr Ladona and his nurse for review to see if he can work her in sooner or of he would like any testing prior to making the appt.   Pt will call if anything changes or worsens prior to hearing back from us .

## 2023-08-25 NOTE — Telephone Encounter (Signed)
 Pt c/o Shortness Of Breath: STAT if SOB developed within the last 24 hours or pt is noticeably SOB on the phone  1. Are you currently SOB (can you hear that pt is SOB on the phone)? No  2. How long have you been experiencing SOB? x1week  3. Are you SOB when sitting or when up moving around? Up moving around  4. Are you currently experiencing any other symptoms? No

## 2023-08-25 NOTE — Telephone Encounter (Signed)
 She has h/o COPD and has had covid also, make appointment for 09/2023, can she in the interim see her PCP or pulmonary??

## 2023-08-25 NOTE — Telephone Encounter (Signed)
 I reached out to the pt and she will reach out to her PCP today.... I will also send to Dr. Verl Dicker nurse to help make her an appt in Feb 2025.

## 2023-08-26 NOTE — Progress Notes (Signed)
Chief Complaint  Patient presents with   Shortness of Breath    SOB started a week ago Tues while at a fit class at [employer]. She could only walk 3 laps upstairs at [facility] and was so out of breath. After her cardio class she had to leave. Even things like simply going up stairs in her house make her out of breath. Still taking omeprazole. She wants to know if you could rec her to a [facility], she would prefer this.    She called Dr. Verl Dicker office on 1/14 reporting that she has been having SOB with minimal exertion over the past week.  She feels well at rest, no cough, no peripheral edema, and no chest pain. But, she is worried since it is new for her.  She presents to get this evaluated today. She is scheduled to see Dr. Jacinto Halim on 09/14/23. She has had prior evaluation for DOE with Dr. Jacinto Halim, had stress test and echo in 2021 (low risk). CT angio in 10/2019 showed aortic atherosclerosis. No mention of COPD; last CXR in chart was in 2017. She takes 81mg  of ASA daily. (factor V Leiden heterozygous, h/o DVT).    Shortness of breath started 1/7 while in an exercise class at [facility].  She was much more out of breath than usual. She usually walks 15 min of laps around the track and 15 mins on the treadmill--she could only walk 2.5-3 laps, was out of breath and had to stop.  She has noted mild DOE with stairs. Not doing too terribly at home with regular activities, but slight shortness of breath noted. Main issue has been with her exercise classes.  This past Tuesday, she had to leave the exercise class, felt too out of breath.  She felt fine the rest of the time (other than mild shortness of breath with stairs).  Denies any recent illness, no chest tightness, wheezing, coughing. No leg swelling. No chest pain, no palpitations or tachycardia.  Chart review of her many MyChart messages and visits with other providers since last visit here:  In late 06/2023--she bumped her leg pretty bad, still had a tender  lump under her skin.  Lump resolved, still sore to touch. Around 07/01/23--twingy pains right near her left rib cage. The pain was very sharp and comes and goes. (Had x 2 or 3 days when she sent message, rec'd eval--stated she would schedule if didn't resolve). These twingy pains come and go, sometimes in the middle of the chest. These don't occur with exertion.  06/22/23 she saw [facility] for complaint of mucous feeling in her throat and excessive throat clearing, voice sounded a little different. She was diagnosed with LPR. 73-month trial of once daily Omeprazole, 40mg  taken 30 minutes before evening meals.  ENT left a message for her earlier this week to see how throat is doing. Was asking for ENT referral for [facility] because of the office she went to not being on MyChart, not getting info in AVS. She wasn't aware that they were [facility] and could access MyChart through a separate account.  She has HTN, hyperlipidemia, atherosclerosis. Compliant with her meds.   PMH, PSH, SH reviewed  Outpatient Encounter Medications as of 08/27/2023  Medication Sig Note   amLODipine (NORVASC) 2.5 MG tablet Take 1 tablet (2.5 mg total) by mouth every evening.    aspirin EC 81 MG tablet Take 81 mg by mouth daily. Swallow whole.    atorvastatin (LIPITOR) 40 MG tablet Take 1 tablet (  40 mg total) by mouth daily.    Calcium Carbonate-Vit D-Min (CALCIUM 600+D PLUS MINERALS) 600-400 MG-UNIT TABS Take 1 capsule by mouth daily at 12 noon.    Cholecalciferol (VITAMIN D) 50 MCG (2000 UT) tablet Take 2,000 Units by mouth daily.    denosumab (PROLIA) 60 MG/ML SOSY injection Inject 60 mg into the skin every 6 (six) months.    losartan-hydrochlorothiazide (HYZAAR) 100-12.5 MG tablet TAKE ONE TABLET BY MOUTH ONCE DAILY. keep appointment FOR additional refills.    Multiple Vitamins-Minerals (MULTIVITAMIN WITH MINERALS) tablet Take 1 tablet by mouth daily.    omeprazole (PRILOSEC) 40 MG capsule Take 40 mg by mouth daily.     SYNTHROID 50 MCG tablet TAKE 1 EVERY MORNING ON AN EMPTY STOMACH PRIOR TO ANY OTHER MEDICATIONS FOR HYPOTHYROIDISM    vitamin C (ASCORBIC ACID) 500 MG tablet Take 500 mg by mouth daily.    acetaminophen (TYLENOL) 500 MG tablet Take 1,000 mg by mouth every 6 (six) hours as needed. (Patient not taking: Reported on 08/27/2023) 08/27/2023: As needed   [DISCONTINUED] dextromethorphan-guaiFENesin (MUCINEX DM) 30-600 MG 12hr tablet Take 1 tablet by mouth 2 (two) times daily.    [DISCONTINUED] Phenylephrine-DM-GG-APAP (MUCINEX FAST-MAX COLD FLU) 5-10-200-325 MG/10ML LIQD Take 20 mLs by mouth as needed. 05/25/2023: Last dose last night   Facility-Administered Encounter Medications as of 08/27/2023  Medication   [START ON 10/19/2023] denosumab (PROLIA) injection 60 mg   Allergies  Allergen Reactions   Naprosyn [Naproxen] Rash    ROS: No f/c, URI symptoms, cough DOE per HPI No nausea, vomiting, heartburn. She is still taking omeprazole, and had been on the days of the exercise classes  No edema, leg pain. Moods are good. See HPI   PHYSICAL EXAM:  BP 118/70   Pulse 84   Ht 5\' 4"  (1.626 m)   Wt 131 lb (59.4 kg)   LMP  (LMP Unknown)   SpO2 98%   BMI 22.49 kg/m   Wt Readings from Last 3 Encounters:  08/27/23 131 lb (59.4 kg)  05/25/23 128 lb 6.4 oz (58.2 kg)  05/04/23 131 lb 3.8 oz (59.5 kg)   Well-appearing female, with somewhat hoarse voice, speaking comfortably, in no distress HEENT: conjunctiva and sclera are clear, EOMI. OP clear Neck: no lymphadenopathy or mass. Thyroid is mildly enlarged, no dominant mass/nodule (known multinodular goiter) Heart: regular rate and rhythm, no murmur Lungs: clear bilaterally, no wheezes, rales, ronchi, normal air movement Abdomen: soft, nontender, no organomegaly or mass Extremities: 2+ pulses, no edema, calves nontender Neuro: alert and oriented, cranial nerves intact, normal gait. Psych: normal mood, affect, hygiene and grooming  EKG--no acute  changes  ASSESSMENT/PLAN:  Dyspnea on exertion - eval for lung dz, PE, anemia. DDx reviewed.  Has appt with cardiology in 2 weeks. No acute changes on EKG - Plan: TSH, CBC with Differential/Platelet, Comprehensive metabolic panel, D-dimer, quantitative, DG Chest 2 View, EKG 12-Lead  Medication monitoring encounter - Plan: TSH, CBC with Differential/Platelet, Comprehensive metabolic panel  Heterozygous factor V Leiden mutation (HCC) - Plan: D-dimer, quantitative  History of DVT (deep vein thrombosis) - taking ASA daily. Will check D-dimer, and will need eval with CT angio if elevated. Clinically no e/o DVT - Plan: D-dimer, quantitative  Hypothyroidism, unspecified type - h/o MNG. Last TSH WNL - Plan: TSH  CBC, TSH, c-met D-dimer Copies to Dr. Jacinto Halim  Discussed her request for ENT referral--she like the practice, just wanted it on MyChart. Advised she can use MyChart under [facility] account to  communicate with them.  Advised she should return their call for f/u.   ADDENDUM: CXR normal D-dimer elevated at 1.07. Spoke with pt--advised to go to ER for evaluation (will need CT angio to r/o PE). Pt agrees.

## 2023-08-26 NOTE — Telephone Encounter (Signed)
 I spoke with patient and scheduled her to see Dr Berry Bristol on Feb 3 at 9:00

## 2023-08-27 ENCOUNTER — Emergency Department (HOSPITAL_BASED_OUTPATIENT_CLINIC_OR_DEPARTMENT_OTHER)
Admission: EM | Admit: 2023-08-27 | Discharge: 2023-08-27 | Disposition: A | Discharge status: Home / Self Care | Payer: Medicare Other | Attending: Emergency Medicine | Admitting: Emergency Medicine

## 2023-08-27 ENCOUNTER — Encounter: Payer: Self-pay | Admitting: Family Medicine

## 2023-08-27 ENCOUNTER — Other Ambulatory Visit: Payer: Self-pay

## 2023-08-27 ENCOUNTER — Ambulatory Visit (INDEPENDENT_AMBULATORY_CARE_PROVIDER_SITE_OTHER): Payer: Medicare Other | Admitting: Family Medicine

## 2023-08-27 ENCOUNTER — Emergency Department (HOSPITAL_BASED_OUTPATIENT_CLINIC_OR_DEPARTMENT_OTHER): Payer: Medicare Other

## 2023-08-27 ENCOUNTER — Encounter (HOSPITAL_BASED_OUTPATIENT_CLINIC_OR_DEPARTMENT_OTHER): Payer: Self-pay | Admitting: Emergency Medicine

## 2023-08-27 ENCOUNTER — Ambulatory Visit
Admission: RE | Admit: 2023-08-27 | Discharge: 2023-08-27 | Disposition: A | Discharge status: Home / Self Care | Payer: Medicare Other | Source: Ambulatory Visit | Attending: Family Medicine | Admitting: Family Medicine

## 2023-08-27 VITALS — BP 118/70 | HR 84 | Ht 64.0 in | Wt 131.0 lb

## 2023-08-27 DIAGNOSIS — Z5181 Encounter for therapeutic drug level monitoring: Secondary | ICD-10-CM | POA: Diagnosis not present

## 2023-08-27 DIAGNOSIS — I10 Essential (primary) hypertension: Secondary | ICD-10-CM | POA: Diagnosis not present

## 2023-08-27 DIAGNOSIS — R0609 Other forms of dyspnea: Secondary | ICD-10-CM

## 2023-08-27 DIAGNOSIS — Z7982 Long term (current) use of aspirin: Secondary | ICD-10-CM | POA: Insufficient documentation

## 2023-08-27 DIAGNOSIS — J9811 Atelectasis: Secondary | ICD-10-CM | POA: Diagnosis not present

## 2023-08-27 DIAGNOSIS — Z87891 Personal history of nicotine dependence: Secondary | ICD-10-CM | POA: Diagnosis not present

## 2023-08-27 DIAGNOSIS — Z79899 Other long term (current) drug therapy: Secondary | ICD-10-CM | POA: Diagnosis not present

## 2023-08-27 DIAGNOSIS — R918 Other nonspecific abnormal finding of lung field: Secondary | ICD-10-CM | POA: Diagnosis not present

## 2023-08-27 DIAGNOSIS — D6851 Activated protein C resistance: Secondary | ICD-10-CM

## 2023-08-27 DIAGNOSIS — Z86718 Personal history of other venous thrombosis and embolism: Secondary | ICD-10-CM

## 2023-08-27 DIAGNOSIS — E039 Hypothyroidism, unspecified: Secondary | ICD-10-CM | POA: Diagnosis not present

## 2023-08-27 DIAGNOSIS — I2510 Atherosclerotic heart disease of native coronary artery without angina pectoris: Secondary | ICD-10-CM | POA: Diagnosis not present

## 2023-08-27 DIAGNOSIS — I700 Atherosclerosis of aorta: Secondary | ICD-10-CM | POA: Diagnosis not present

## 2023-08-27 DIAGNOSIS — R0602 Shortness of breath: Secondary | ICD-10-CM | POA: Diagnosis not present

## 2023-08-27 LAB — CBC WITH DIFFERENTIAL/PLATELET
Abs Immature Granulocytes: 0.04 10*3/uL (ref 0.00–0.07)
Basophils Absolute: 0 10*3/uL (ref 0.0–0.1)
Basophils Relative: 0 %
Eosinophils Absolute: 0.2 10*3/uL (ref 0.0–0.5)
Eosinophils Relative: 2 %
HCT: 40.4 % (ref 36.0–46.0)
Hemoglobin: 13.5 g/dL (ref 12.0–15.0)
Immature Granulocytes: 0 %
Lymphocytes Relative: 26 %
Lymphs Abs: 2.7 10*3/uL (ref 0.7–4.0)
MCH: 33.8 pg (ref 26.0–34.0)
MCHC: 33.4 g/dL (ref 30.0–36.0)
MCV: 101 fL — ABNORMAL HIGH (ref 80.0–100.0)
Monocytes Absolute: 0.9 10*3/uL (ref 0.1–1.0)
Monocytes Relative: 9 %
Neutro Abs: 6.5 10*3/uL (ref 1.7–7.7)
Neutrophils Relative %: 63 %
Platelets: 234 10*3/uL (ref 150–400)
RBC: 4 MIL/uL (ref 3.87–5.11)
RDW: 14 % (ref 11.5–15.5)
WBC: 10.4 10*3/uL (ref 4.0–10.5)
nRBC: 0 % (ref 0.0–0.2)

## 2023-08-27 LAB — BASIC METABOLIC PANEL
Anion gap: 10 (ref 5–15)
BUN: 27 mg/dL — ABNORMAL HIGH (ref 8–23)
CO2: 30 mmol/L (ref 22–32)
Calcium: 10.3 mg/dL (ref 8.9–10.3)
Chloride: 100 mmol/L (ref 98–111)
Creatinine, Ser: 1.07 mg/dL — ABNORMAL HIGH (ref 0.44–1.00)
GFR, Estimated: 53 mL/min — ABNORMAL LOW (ref >60)
Glucose, Bld: 149 mg/dL — ABNORMAL HIGH (ref 70–99)
Potassium: 3.3 mmol/L — ABNORMAL LOW (ref 3.5–5.1)
Sodium: 140 mmol/L (ref 135–145)

## 2023-08-27 LAB — D-DIMER, QUANTITATIVE: D-DIMER: 1.07 mg{FEU}/L — ABNORMAL HIGH (ref 0.00–0.49)

## 2023-08-27 LAB — TROPONIN I (HIGH SENSITIVITY): Troponin I (High Sensitivity): 7 ng/L (ref <18)

## 2023-08-27 MED ORDER — IOHEXOL 350 MG/ML SOLN
100.0000 mL | Freq: Once | INTRAVENOUS | Status: AC | PRN
  Administered 2023-08-27: 75 mL via INTRAVENOUS

## 2023-08-27 NOTE — ED Triage Notes (Signed)
C/o SHOB x 1 week. Was seen at PCP and had elevated D-Dimer and was told to come here. Denies CP.

## 2023-08-27 NOTE — Discharge Instructions (Addendum)
Your CT scan did not show evidence of a blood clot in your lungs.  It did show a small nodule for which you should get a repeat scan done in 6-12 months to monitor this.  Please continue all of your current medications and follow-up closely with your PCP and cardiologist.  Be sure to seek medical attention if you experience worsening symptoms including shortness of breath or chest pain

## 2023-08-27 NOTE — ED Notes (Signed)
AVS provided by edp was reviewed with pt. Pt verbalized understanding with no additional questions at this time. Pt to go home with s/o at bedside

## 2023-08-27 NOTE — ED Provider Notes (Signed)
[place] EMERGENCY DEPARTMENT AT [facility] Provider Note   CSN: [id number] Arrival date & time: 08/27/23  1714     History  Chief Complaint  Patient presents with   Shortness of Breath    Dorothy Rojas is a 78 y.o. female.  78 year old female with past medical history including factor V Leyden, history of DVT, hypertension, hyperlipidemia, former smoker presenting from PCP office due to elevated D-dimer.  Patient reports about 1 week history of shortness of breath on exertion, acute onset while she was in an exercise class.  Reports since last Tuesday she has felt very short of breath whenever she exercises.  She denies any chest pain, nausea/vomiting, abdominal pain, or diaphoresis with these episodes.  Currently denies these symptoms as well.  Denies current shortness of breath.  Denies leg swelling or pain.  No syncope or falls. Endorses some mild left rib pain with deep breathing.     Shortness of Breath Associated symptoms: no abdominal pain, no chest pain, no cough, no fever, no headaches, no vomiting and no wheezing        Home Medications Prior to Admission medications   Medication Sig Start Date End Date Taking? Authorizing Provider  acetaminophen (TYLENOL) 500 MG tablet Take 1,000 mg by mouth every 6 (six) hours as needed. Patient not taking: Reported on 08/27/2023    [provider]  amLODipine (NORVASC) 2.5 MG tablet Take 1 tablet (2.5 mg total) by mouth every evening. 01/22/23 08/27/23  Yates Decamp, MD  aspirin EC 81 MG tablet Take 81 mg by mouth daily. Swallow whole.    [provider]  atorvastatin (LIPITOR) 40 MG tablet Take 1 tablet (40 mg total) by mouth daily. 05/04/23   Joselyn Arrow, MD  Calcium Carbonate-Vit D-Min (CALCIUM 600+D PLUS MINERALS) 600-400 MG-UNIT TABS Take 1 capsule by mouth daily at 12 noon.    [provider]  Cholecalciferol (VITAMIN D) 50 MCG (2000 UT) tablet Take 2,000 Units by mouth daily.    [provider]  denosumab (PROLIA) 60 MG/ML SOSY injection Inject 60 mg into the skin every 6 (six) months.    [provider]  losartan-hydrochlorothiazide (HYZAAR) 100-12.5 MG tablet TAKE ONE TABLET BY MOUTH ONCE DAILY. keep appointment FOR additional refills. 04/22/23   Yates Decamp, MD  Multiple Vitamins-Minerals (MULTIVITAMIN WITH MINERALS) tablet Take 1 tablet by mouth daily.    [provider]  omeprazole (PRILOSEC) 40 MG capsule Take 40 mg by mouth daily.    [provider]  SYNTHROID 50 MCG tablet TAKE 1 EVERY MORNING ON AN EMPTY STOMACH PRIOR TO ANY OTHER MEDICATIONS FOR HYPOTHYROIDISM 06/30/23   Joselyn Arrow, MD  vitamin C (ASCORBIC ACID) 500 MG tablet Take 500 mg by mouth daily.    [provider]      Allergies    Naprosyn [naproxen]    Review of Systems   Review of Systems  Constitutional:  Negative for fatigue and fever.  Respiratory:  Positive for shortness of breath. Negative for cough, chest tightness and wheezing.   Cardiovascular:  Negative for chest pain, palpitations and leg swelling.  Gastrointestinal:  Negative for abdominal pain, nausea and vomiting.  Neurological:  Negative for weakness, light-headedness, numbness and headaches.    Physical Exam Updated Vital Signs BP 137/60   Pulse 78   Resp 16   Ht 5\' 4"  (1.626 m)   Wt 59 kg   LMP  (LMP Unknown)   SpO2 98%   BMI 22.33 kg/m  Physical Exam Constitutional:      General: She is not in acute distress. HENT:     Head: Normocephalic and atraumatic.  Eyes:     Extraocular Movements: Extraocular movements intact.     Pupils: Pupils are equal, round, and reactive to light.  Cardiovascular:     Rate and Rhythm: Normal rate and regular rhythm.     Heart sounds: No murmur heard. Pulmonary:     Effort: Pulmonary effort is normal. No respiratory distress.     Breath sounds: Normal breath sounds. No wheezing, rhonchi or rales.  Chest:     Chest wall: Tenderness present.      Comments: L lateral chest wall tenderness Abdominal:     Palpations: Abdomen is soft.     Tenderness: There is no abdominal tenderness.  Musculoskeletal:     Cervical back: Normal range of motion and neck supple.     Right lower leg: No tenderness. No edema.     Left lower leg: No tenderness. No edema.  Skin:    General: Skin is warm and dry.  Neurological:     General: No focal deficit present.     Mental Status: She is alert.     ED Results / Procedures / Treatments   Labs (all labs ordered are listed, but only abnormal results are displayed) Labs Reviewed  CBC WITH DIFFERENTIAL/PLATELET - Abnormal; Notable for the following components:      Result Value   MCV 101.0 (*)    All other components within normal limits  BASIC METABOLIC PANEL - Abnormal; Notable for the following components:   Potassium 3.3 (*)    Glucose, Bld 149 (*)    BUN 27 (*)    Creatinine, Ser 1.07 (*)    GFR, Estimated 53 (*)    All other components within normal limits  TROPONIN I (HIGH SENSITIVITY)    EKG EKG Interpretation Date/Time:  Thursday August 27 2023 17:23:30 EST Ventricular Rate:  87 PR Interval:  132 QRS Duration:  76 QT Interval:  342 QTC Calculation: 411 R Axis:   59  Text Interpretation: Normal sinus rhythm Nonspecific ST abnormality Abnormal ECG When compared with ECG of 15-Sep-2017 08:58, ST now depressed in Inferior leads ST now depressed in Lateral leads Confirmed by Glyn Ade [phone]) on 08/27/2023 8:01:15 PM  Radiology CT Angio Chest Pulmonary Embolism (PE) W or WO Contrast Result Date: 08/27/2023 CLINICAL DATA:  Pulmonary embolism suspected, low to intermediate probability, positive D-dimer. Shortness of breath for 1 week. EXAM: CT ANGIOGRAPHY CHEST WITH CONTRAST TECHNIQUE: Multidetector CT imaging of the chest was performed using the standard protocol during bolus administration of intravenous contrast. Multiplanar CT image reconstructions and MIPs were obtained to  evaluate the vascular anatomy. RADIATION DOSE REDUCTION: This exam was performed according to the departmental dose-optimization program which includes automated exposure control, adjustment of the mA and/or kV according to patient size and/or use of iterative reconstruction technique. CONTRAST:  75mL OMNIPAQUE IOHEXOL 350 MG/ML SOLN COMPARISON:  10/14/2019. FINDINGS: Cardiovascular: The heart is normal in size and there is no pericardial effusion. There is atherosclerotic calcification of the coronary arteries and aorta. The aorta and pulmonary trunk are normal in caliber. No evidence of pulmonary embolism is seen. Mediastinum/Nodes: No enlarged mediastinal, hilar, or axillary lymph nodes. Thyroid gland, trachea, and esophagus demonstrate no significant findings. Lungs/Pleura: There is an 8 mm nodule in the right upper lobe, axial image 32. Atelectasis is present bilaterally. No effusions or pneumothorax. Upper Abdomen: No acute  abnormality. Musculoskeletal: No acute osseous abnormality is seen. Review of the MIP images confirms the above findings. IMPRESSION: 1. No evidence of pulmonary embolism or other acute process. 2. 8 mm right upper lobe pulmonary nodule. Non-contrast chest CT at 6-12 months is recommended. If the nodule is stable at time of repeat CT, then future CT at 18-24 months (from today's scan) is considered optional for low-risk patients, but is recommended for high-risk patients. This recommendation follows the consensus statement: Guidelines for Management of Incidental Pulmonary Nodules Detected on CT Images: From the Fleischner Society 2017; Radiology 2017; 284:228-243. 3. Coronary artery calcifications. 4. Aortic atherosclerosis. Electronically Signed   By: Thornell Sartorius M.D.   On: 08/27/2023 19:16   DG Chest 2 View Result Date: 08/27/2023 CLINICAL DATA:  Dyspnea on exertion. Shortness of breath on exertion for 1 week. EXAM: CHEST - 2 VIEW COMPARISON:  03/04/2016 FINDINGS: Normal heart size  and pulmonary vascularity. No focal airspace disease or consolidation in the lungs. No blunting of costophrenic angles. No pneumothorax. Mediastinal contours appear intact. Calcification of the aorta. Degenerative changes in the spine. IMPRESSION: No active cardiopulmonary disease. Electronically Signed   By: Burman Nieves M.D.   On: 08/27/2023 13:00    Procedures Procedures    Medications Ordered in ED Medications  iohexol (OMNIPAQUE) 350 MG/ML injection 100 mL (75 mLs Intravenous Contrast Given 08/27/23 1819)    ED Course/ Medical Decision Making/ A&P Clinical Course as of 08/27/23 2033  Thu Aug 27, 2023  1958 Stable  77YOF with a ccx of SOB and + ddimer.  [CC]    Clinical Course User Index [CC] Glyn Ade, MD                                 Medical Decision Making 78 year old female with history of factor V Leyden, history of DVT, hypertension, hyperlipidemia, former smoker presenting due to elevated D-dimer outpatient in the setting of 1 week history of dyspnea on exertion.  EKG NSR without acute ischemic findings.  Chest x-ray done outpatient showed no acute cardiopulmonary process.  CTA chest upon arrival showed no evidence of acute PE but does show a 8 mm nodule in her right upper lobe which is new from prior, repeat noncontrast CT recommended in 6-12 months.  No evidence of DVT on exam. Mild pleuritic pain likely MSK in origin given reproducibility on exam.  Reassured that she is hemodynamically stable on room air and currently asymptomatic.  Will check troponin given her DOE and hx of coronary atherosclerosis. CBC and BMP unremarkable.  8:32 PM Trop WNL. Pt remains asymptomatic. Discussed w/ patient, she is comfortable with discharge. Discussed return precautions.   Amount and/or Complexity of Data Reviewed Labs: ordered. Radiology: ordered.  Risk Prescription drug management.          Final Clinical Impression(s) / ED Diagnoses Final diagnoses:   None    Rx / DC Orders ED Discharge Orders     None         Vonna Drafts, MD 08/27/23 2033    Glyn Ade, MD 08/27/23 2306

## 2023-08-27 NOTE — Patient Instructions (Signed)
Look at your Apple Watch when you are having significant shortness of breath.  This might alert [place] to if you could be having atrial fibrillation (an arrhythmia that can contribute to shortness of breath).  Go to [facility] (or [facility]) for a chest x-ray. We are checking labs to evaluate your shortness of breath, and to assess for possible blood clot. If the D-dimer is elevated, we will need to send you for a scan to rule out pulmonary embolism.

## 2023-08-28 LAB — CBC WITH DIFFERENTIAL/PLATELET
Basophils Absolute: 0 10*3/uL (ref 0.0–0.2)
Basos: 0 %
EOS (ABSOLUTE): 0.2 10*3/uL (ref 0.0–0.4)
Eos: 3 %
Hematocrit: 41.1 % (ref 34.0–46.6)
Hemoglobin: 13.7 g/dL (ref 11.1–15.9)
Immature Grans (Abs): 0.1 10*3/uL (ref 0.0–0.1)
Immature Granulocytes: 1 %
Lymphocytes Absolute: 2.4 10*3/uL (ref 0.7–3.1)
Lymphs: 24 %
MCH: 33.7 pg — ABNORMAL HIGH (ref 26.6–33.0)
MCHC: 33.3 g/dL (ref 31.5–35.7)
MCV: 101 fL — ABNORMAL HIGH (ref 79–97)
Monocytes Absolute: 0.9 10*3/uL (ref 0.1–0.9)
Monocytes: 10 %
Neutrophils Absolute: 6 10*3/uL (ref 1.4–7.0)
Neutrophils: 62 %
Platelets: 241 10*3/uL (ref 150–450)
RBC: 4.06 x10E6/uL (ref 3.77–5.28)
RDW: 13.3 % (ref 11.7–15.4)
WBC: 9.6 10*3/uL (ref 3.4–10.8)

## 2023-08-28 LAB — TSH: TSH: 2.32 u[IU]/mL (ref 0.450–4.500)

## 2023-08-28 LAB — COMPREHENSIVE METABOLIC PANEL
ALT: 27 [IU]/L (ref 0–32)
AST: 22 [IU]/L (ref 0–40)
Albumin: 4.2 g/dL (ref 3.8–4.8)
Alkaline Phosphatase: 46 [IU]/L (ref 44–121)
BUN/Creatinine Ratio: 16 (ref 12–28)
BUN: 16 mg/dL (ref 8–27)
Bilirubin Total: 0.7 mg/dL (ref 0.0–1.2)
CO2: 26 mmol/L (ref 20–29)
Calcium: 10.2 mg/dL (ref 8.7–10.3)
Chloride: 100 mmol/L (ref 96–106)
Creatinine, Ser: 0.97 mg/dL (ref 0.57–1.00)
Globulin, Total: 2.9 g/dL (ref 1.5–4.5)
Glucose: 88 mg/dL (ref 70–99)
Potassium: 4.3 mmol/L (ref 3.5–5.2)
Sodium: 142 mmol/L (ref 134–144)
Total Protein: 7.1 g/dL (ref 6.0–8.5)
eGFR: 60 mL/min/{1.73_m2} (ref >59)

## 2023-08-31 DIAGNOSIS — M25511 Pain in right shoulder: Secondary | ICD-10-CM | POA: Diagnosis not present

## 2023-09-02 ENCOUNTER — Telehealth: Payer: Self-pay

## 2023-09-02 NOTE — Progress Notes (Signed)
Transition Care Management Unsuccessful Follow-up Telephone Call  Date of discharge and from where:  08/27/2023 [facility]  Attempts:  1st Attempt  Reason for unsuccessful TCM follow-up call:  Left voice message  Sakia Schrimpf [facility]  [facility] Guide Direct Dial: [phone]  Fax: [phone] Website: [email or web address]

## 2023-09-03 ENCOUNTER — Telehealth: Payer: Self-pay

## 2023-09-03 NOTE — Progress Notes (Signed)
Transition Care Management Follow-up Telephone Call Date of discharge and from where: 08/27/2023 [facility] How have you been since you were released from the hospital? Patient stated she is feeling ok but still has SOB on exertion. Any questions or concerns? No  Items Reviewed: Did the pt receive and understand the discharge instructions provided? Yes  Medications obtained and verified?  No medication prescribed Other? No  Any new allergies since your discharge? No  Dietary orders reviewed? Yes Do you have support at home? Yes   Follow up appointments reviewed:  PCP Hospital f/u appt confirmed? Yes  Scheduled to see Joselyn Arrow, MD on 11/02/2023 @ [facility]. Specialist Hospital f/u appt confirmed? Yes  Scheduled to see Yates Decamp, MD on 09/14/2023 @ [facility]. Are transportation arrangements needed? No  If their condition worsens, is the pt aware to call PCP or go to the Emergency Dept.? Yes Was the patient provided with contact information for the PCP's office or ED? Yes Was to pt encouraged to call back with questions or concerns? Yes   Sativa Gelles [facility]  [facility] Guide Direct Dial: [phone]  Fax: [phone] Website: [email or web address]

## 2023-09-13 NOTE — Progress Notes (Unsigned)
Cardiology Office Note:  .   Date:  09/14/2023  ID:  Dorothy Rojas, DOB April 04, 1946, MRN [id number] PCP: Joselyn Arrow, MD  [facility] Providers Cardiologist:  Yates Decamp, MD   History of Present Illness: Dorothy Rojas   Dorothy Rojas is a 78 y.o. Caucasian female with chronic dyspnea on exertion, prior history of tobacco use disorder approximately 15 to 20 pack year history quit in 1986, hypertension, hyperlipidemia, hyperglycemia, history of DVT and factor V Leyden positive in 2014. Pulmonary evaluation in 2021 has not reveled etiology of her symptoms presenting with worsening dyspnea and outpatient evaluation had revealed elevated D-dimer and CT angiogram chest on 08/27/2023 did not reveal evidence of PE but new nodule recommended follow-up.  She was also found to have coronary and aortic atherosclerosis during CT scan.  She now presents for further evaluation of dyspnea from cardiac standpoint.  Dyspnea worse over the past 4 to 6 weeks.  States that exertion activity brings on dyspnea and relieved with rest.  No associated chest pain or palpitations.  No PND or orthopnea or leg edema.  Otherwise feels well and since recent ED visit, has started to exercise again but still feels shortness of breath.  She was found to have a pulmonary nodule that is new compared to prior scanning.  Discussed the use of AI scribe software for clinical note transcription with the patient, who gave verbal consent to proceed.  History of Present Illness   The patient is a 78 year old who presents with worsening dyspnea on exertion.  Over the past four to six weeks, she has experienced worsening dyspnea on exertion, which has significantly impacted her daily activities. Previously manageable, the shortness of breath now affects activities such as walking to the gym and participating in classes, sometimes necessitating cessation of these activities.  She has a long-standing history of shortness of breath, with prior  evaluations including a stress test and echocardiogram, both unremarkable. Recent investigations, including a D-dimer test and CT angiogram, ruled out blood clots and significant COPD. A small lung nodule was identified, with follow-up recommended in six months. No leg swelling or nocturnal dyspnea is reported, and she continues to maintain exercise levels despite symptoms.  Current medications include amlodipine 2.5 mg and losartan HCT 100/12.5 mg for hypertension, and atorvastatin 40 mg for cholesterol management. She receives Prolia injections twice a year for osteoporosis.  No leg swelling or nocturnal dyspnea. She experiences 'huffing and puffing' with exertion but does not have leg or muscle weakness.      Labs   Lab Results  Component Value Date   CHOL 124 02/10/2023   HDL 57 02/10/2023   LDLCALC 44 02/10/2023   TRIG 132 02/10/2023   CHOLHDL 2.2 02/10/2023   Lab Results  Component Value Date   NA 140 08/27/2023   K 3.3 (L) 08/27/2023   CO2 30 08/27/2023   GLUCOSE 149 (H) 08/27/2023   BUN 27 (H) 08/27/2023   CREATININE 1.07 (H) 08/27/2023   CALCIUM 10.3 08/27/2023   GFR 66.36 10/14/2019   EGFR 60 08/27/2023   GFRNONAA 53 (L) 08/27/2023      Latest Ref Rng & Units 08/27/2023    5:22 PM 08/27/2023   11:31 AM 04/29/2023    8:06 AM  BMP  Glucose 70 - 99 mg/dL 213  88  086   BUN 8 - 23 mg/dL 27  16  19    Creatinine 0.44 - 1.00 mg/dL 5.78  4.69  6.29  BUN/Creat Ratio 12 - 28  16  22    Sodium 135 - 145 mmol/L 140  142  143   Potassium 3.5 - 5.1 mmol/L 3.3  4.3  4.0   Chloride 98 - 111 mmol/L 100  100  103   CO2 22 - 32 mmol/L 30  26  25    Calcium 8.9 - 10.3 mg/dL 86.5  78.4  9.7       Latest Ref Rng & Units 08/27/2023    5:22 PM 08/27/2023   11:31 AM 04/29/2023    8:06 AM  CBC  WBC 4.0 - 10.5 K/uL 10.4  9.6  8.9   Hemoglobin 12.0 - 15.0 g/dL 69.6  29.5  28.4   Hematocrit 36.0 - 46.0 % 40.4  41.1  36.6   Platelets 150 - 400 K/uL 234  241  221    Lab Results   Component Value Date   HGBA1C 6.2 (H) 04/29/2023    Lab Results  Component Value Date   TSH 2.320 08/27/2023    Review of Systems  Cardiovascular:  Positive for dyspnea on exertion. Negative for chest pain, leg swelling, near-syncope, orthopnea and palpitations.   Physical Exam:   VS:  BP 132/74 (BP Location: Left Arm, Patient Position: Sitting, Cuff Size: Normal)   Pulse 85   Resp 16   Ht 5\' 4"  (1.626 m)   Wt 132 lb 12.8 oz (60.2 kg)   LMP  (LMP Unknown)   SpO2 97%   BMI 22.80 kg/m    Wt Readings from Last 3 Encounters:  09/14/23 132 lb 12.8 oz (60.2 kg)  08/27/23 130 lb 1.1 oz (59 kg)  08/27/23 131 lb (59.4 kg)    Physical Exam Neck:     Vascular: No carotid bruit or JVD.  Cardiovascular:     Rate and Rhythm: Normal rate and regular rhythm.     Pulses: Intact distal pulses.     Heart sounds: Normal heart sounds. No murmur heard.    No gallop.  Pulmonary:     Effort: Pulmonary effort is normal.     Breath sounds: Normal breath sounds.  Abdominal:     General: Bowel sounds are normal.     Palpations: Abdomen is soft.  Musculoskeletal:     Right lower leg: No edema.     Left lower leg: No edema.    Studies Reviewed: Dorothy Rojas    ECHOCARDIOGRAM COMPLETE 06/19/2020  Normal LV systolic function with visual EF 60-65%. Left ventricle cavity is normal in size. Mild left ventricular hypertrophy. Normal global wall motion. Normal diastolic filling pattern, normal LAP. Mild (Grade I) mitral regurgitation. Mild tricuspid regurgitation. No evidence of pulmonary hypertension. Compared to prior study dated 04/09/2016 no significant changes noted.  Exercise Myoview stress test 01/25/2020: Exercise nuclear stress test was performed using Bruce protocol. Patient reached 7 METS, and 90% of age predicted maximum heart rate. Exercise capacity was low. Chest pain not reported. Dyspnea , fatigue reported. Heart rate and hemodynamic response were normal. Interpretation of stress EKG limited due  to significant baseline artifact. Normal myocardial perfusion. Stress LVEF 82%. Low risk study.  CT angio chest 08/27/2023: Cardiovascular: The heart is normal in size and there is no pericardial effusion. There is atherosclerotic calcification of the coronary arteries and aorta. The aorta and pulmonary trunk are normal in caliber.  No evidence of pulmonary embolism is seen. Lungs/Pleura: There is an 8 mm nodule in the right upper lobe, axial image 32. Atelectasis is present bilaterally. No  effusions or pneumothorax.  EKG:    EKG Interpretation Date/Time:  Monday September 14 2023 08:34:49 EST Ventricular Rate:  82 PR Interval:  138 QRS Duration:  78 QT Interval:  346 QTC Calculation: 404 R Axis:   25  Text Interpretation: EKG 09/14/2023 : Normal sinus rhythm at the rate of 82 bpm, normal axis, nonspecific ST changes.  No significant change from 08/27/2023. Confirmed by Delrae Rend [phone]) on 09/14/2023 8:39:59 AM    Medications and allergies    Allergies  Allergen Reactions   Naprosyn [Naproxen] Rash     Current Outpatient Medications:    acetaminophen (TYLENOL) 500 MG tablet, Take 1,000 mg by mouth every 6 (six) hours as needed., Disp: , Rfl:    aspirin EC 81 MG tablet, Take 81 mg by mouth daily. Swallow whole., Disp: , Rfl:    atorvastatin (LIPITOR) 40 MG tablet, Take 1 tablet (40 mg total) by mouth daily., Disp: 90 tablet, Rfl: 1   Calcium Carbonate-Vit D-Min (CALCIUM 600+D PLUS MINERALS) 600-400 MG-UNIT TABS, Take 1 capsule by mouth daily at 12 noon., Disp: , Rfl:    Cholecalciferol (VITAMIN D) 50 MCG (2000 UT) tablet, Take 2,000 Units by mouth daily., Disp: , Rfl:    denosumab (PROLIA) 60 MG/ML SOSY injection, Inject 60 mg into the skin every 6 (six) months., Disp: , Rfl:    losartan-hydrochlorothiazide (HYZAAR) 100-12.5 MG tablet, TAKE ONE TABLET BY MOUTH ONCE DAILY. keep appointment FOR additional refills., Disp: 90 tablet, Rfl: 3   Multiple Vitamins-Minerals  (MULTIVITAMIN WITH MINERALS) tablet, Take 1 tablet by mouth daily., Disp: , Rfl:    omeprazole (PRILOSEC) 40 MG capsule, Take 40 mg by mouth daily., Disp: , Rfl:    SYNTHROID 50 MCG tablet, TAKE 1 EVERY MORNING ON AN EMPTY STOMACH PRIOR TO ANY OTHER MEDICATIONS FOR HYPOTHYROIDISM, Disp: 90 tablet, Rfl: 1   vitamin C (ASCORBIC ACID) 500 MG tablet, Take 500 mg by mouth daily., Disp: , Rfl:    amLODipine (NORVASC) 2.5 MG tablet, Take 1 tablet (2.5 mg total) by mouth every evening., Disp: 90 tablet, Rfl: 3  Current Facility-Administered Medications:    [START ON 10/19/2023] denosumab (PROLIA) injection 60 mg, 60 mg, Subcutaneous, Once, Joselyn Arrow, MD   ASSESSMENT AND PLAN: .      ICD-10-CM   1. Dyspnea on exertion  R06.09 EKG 12-Lead    Cardiac Stress Test: Informed Consent Details: Physician/Practitioner Attestation; Transcribe to consent form and obtain patient signature    Ambulatory referral to Pulmonology    MYOCARDIAL PERFUSION IMAGING    2. Essential hypertension  I10     3. Nonspecific abnormal electrocardiogram (ECG) (EKG)  R94.31 Cardiac Stress Test: Informed Consent Details: Physician/Practitioner Attestation; Transcribe to consent form and obtain patient signature    MYOCARDIAL PERFUSION IMAGING    4. Pure hypercholesterolemia  E78.00      Assessment and Plan    Dyspnea on Exertion Worsening shortness of breath over the past 4-6 weeks, particularly upon exertion. Previous evaluations (D-dimer, CT angiogram, echocardiogram) showed no significant abnormalities. EKG remains slightly abnormal but unchanged. Suspected cardiac or pulmonary causes. Discussed stress test to rule out cardiac causes and referral to pulmonary specialist for lung function and nodule evaluation. If stress test shows blockage, heart catheterization may be needed. If no blockage, dyspnea likely not heart-related. - Order exercise nuclear stress test in view of age, abnormal EKG, mild atherosclerosis noted on CT  chest, hypertension and hypercholesterolemia. - Refer to pulmonary specialist for lung function and nodule  evaluation  Lung Nodule Small, focal lung nodule identified, recommended for follow-up in 6 months. Appears benign but requires monitoring. Discussed potential need for surgical removal if nodule becomes irregular. - Refer to pulmonary specialist for follow-up of lung nodule  Hypertension Blood pressure well-controlled on amlodipine 2.5 mg and losartan HCT 100/12.5 mg. Recent measurements: 132/74 mmHg. - Continue current antihypertensive medications  Hyperlipidemia LDL cholesterol well-controlled at 44 mg/dL on atorvastatin 40 mg. Considered low cholesterol as a symptom contributor but no changes made. - Continue atorvastatin 40 mg  General Health Maintenance On Prolia for osteoporosis, receiving injections twice a year. Overall health metrics, including cholesterol and blood pressure, are well-managed. - Continue Prolia injections as scheduled  Follow-up - Follow up with me as needed depending upon exercise nuclear stress test - Follow up with pulmonary specialist for lung nodule and dyspnea evaluation.  -I do not think she needs an echocardiogram as her EKG and physical examination is unchanged from previous and she has had a essentially normal echocardiogram in 2021 and also recent CT does not reveal any evidence of pulmonary hypertension.  Signed,  Yates Decamp, MD, [facility] 09/14/2023, 8:58 AM [facility] [address] [place], [place] [address] Phone: [phone]. Fax:  [phone]

## 2023-09-14 ENCOUNTER — Ambulatory Visit: Payer: Medicare Other | Attending: Cardiology | Admitting: Cardiology

## 2023-09-14 ENCOUNTER — Encounter: Payer: Self-pay | Admitting: *Deleted

## 2023-09-14 ENCOUNTER — Encounter: Payer: Self-pay | Admitting: Cardiology

## 2023-09-14 ENCOUNTER — Encounter: Payer: Self-pay | Admitting: Family Medicine

## 2023-09-14 VITALS — BP 132/74 | HR 85 | Resp 16 | Ht 64.0 in | Wt 132.8 lb

## 2023-09-14 DIAGNOSIS — I10 Essential (primary) hypertension: Secondary | ICD-10-CM | POA: Diagnosis not present

## 2023-09-14 DIAGNOSIS — R0609 Other forms of dyspnea: Secondary | ICD-10-CM | POA: Insufficient documentation

## 2023-09-14 DIAGNOSIS — R9431 Abnormal electrocardiogram [ECG] [EKG]: Secondary | ICD-10-CM | POA: Insufficient documentation

## 2023-09-14 DIAGNOSIS — E7800 Pure hypercholesterolemia, unspecified: Secondary | ICD-10-CM | POA: Diagnosis not present

## 2023-09-14 NOTE — Patient Instructions (Signed)
Medication Instructions:  Your physician recommends that you continue on your current medications as directed. Please refer to the Current Medication list given to you today.   *If you need a refill on your cardiac medications before your next appointment, please call your pharmacy*   Lab Work: none If you have labs (blood work) drawn today and your tests are completely normal, you will receive your results only by: MyChart Message (if you have MyChart) OR A paper copy in the mail If you have any lab test that is abnormal or we need to change your treatment, we will call you to review the results.   Testing/Procedures: Your physician has requested that you have an exercise stress myoview. For further information please visit [email or web address] Please follow instruction sheet, as given.    Follow-Up: At [facility], you and your health needs are our priority.  As part of our continuing mission to provide you with exceptional heart care, we have created designated Provider Care Teams.  These Care Teams include your primary Cardiologist (physician) and Advanced Practice Providers (APPs -  Physician Assistants and Nurse Practitioners) who all work together to provide you with the care you need, when you need it.  We recommend signing up for the patient portal called "MyChart".  Sign up information is provided on this After Visit Summary.  MyChart is used to connect with patients for Virtual Visits (Telemedicine).  Patients are able to view lab/test results, encounter notes, upcoming appointments, etc.  Non-urgent messages can be sent to your provider as well.   To learn more about what you can do with MyChart, go to [email or web address].    Your next appointment:   As needed  Provider:   Yates Decamp, MD     Other Instructions

## 2023-09-15 ENCOUNTER — Encounter: Payer: Self-pay | Admitting: Cardiology

## 2023-09-16 ENCOUNTER — Telehealth (HOSPITAL_COMMUNITY): Payer: Self-pay | Admitting: *Deleted

## 2023-09-16 NOTE — Telephone Encounter (Signed)
 Patient given detailed instructions per Myocardial Perfusion Study Information Sheet for the test on 09/21/2023 at 7:45. Patient notified to arrive 15 minutes early and that it is imperative to arrive on time for appointment to keep from having the test rescheduled.  If you need to cancel or reschedule your appointment, please call the office within 24 hours of your appointment. . Patient verbalized understanding.Dorothy Rojas

## 2023-09-21 ENCOUNTER — Ambulatory Visit (HOSPITAL_COMMUNITY): Payer: Medicare Other | Attending: Cardiology

## 2023-09-21 ENCOUNTER — Encounter: Payer: Self-pay | Admitting: Cardiology

## 2023-09-21 DIAGNOSIS — R9431 Abnormal electrocardiogram [ECG] [EKG]: Secondary | ICD-10-CM | POA: Diagnosis not present

## 2023-09-21 DIAGNOSIS — R0609 Other forms of dyspnea: Secondary | ICD-10-CM | POA: Insufficient documentation

## 2023-09-21 LAB — MYOCARDIAL PERFUSION IMAGING
Angina Index: 0
Estimated workload: 7
Exercise duration (min): 5 min
Exercise duration (sec): 2 s
LV dias vol: 33 mL (ref 46–106)
LV sys vol: 7 mL
MPHR: 143 {beats}/min
Nuc Stress EF: 78 %
Peak HR: 127 {beats}/min
Percent HR: 88 %
Rest HR: 74 {beats}/min
Rest Nuclear Isotope Dose: 9.6 mCi
SDS: 0
SRS: 0
SSS: 0
Stress Nuclear Isotope Dose: 31.7 mCi
TID: 0.89

## 2023-09-21 MED ORDER — TECHNETIUM TC 99M TETROFOSMIN IV KIT
9.6000 | PACK | Freq: Once | INTRAVENOUS | Status: AC | PRN
  Administered 2023-09-21: 9.6 via INTRAVENOUS

## 2023-09-21 MED ORDER — TECHNETIUM TC 99M TETROFOSMIN IV KIT
31.7000 | PACK | Freq: Once | INTRAVENOUS | Status: AC | PRN
  Administered 2023-09-21: 31.7 via INTRAVENOUS

## 2023-09-21 NOTE — Progress Notes (Signed)
 Stress EKG is positive for myocardial ischemia however this was very similar in 2021 4 years ago and nuclear perfusion was completely normal.  Hence do not suspect significant coronary artery disease, would recommend continued medical therapy for now.  If patient persist to have marked dyspnea that is not explained by her pulmonary status, we could consider right and left heart catheterization

## 2023-09-29 ENCOUNTER — Telehealth: Payer: Self-pay

## 2023-09-29 NOTE — Telephone Encounter (Signed)
Pt ready for scheduling for PROLIA on or after : 10/19/23  Out-of-pocket cost due at time of visit: $0  Number of injection/visits approved: ---  Primary: MEDICARE Prolia co-insurance: 0% Admin fee co-insurance: 0%  Secondary: BCBS[place]-MED[place] Prolia co-insurance:  covers the Medicare Part B deductible, co-insurance and 100% of the excess charges Admin fee co-insurance:   Medical Benefit Details: Date Benefits were checked: 09/29/23 Deductible: $257 Met of $257 Required/ Coinsurance: 0%/ Admin Fee: 0%  Prior Auth: N/A PA# Expiration Date:   # of doses approved:  Pharmacy benefit: Copay $--- If patient wants fill through the pharmacy benefit please send prescription to:  --- , and include estimated need by date in rx notes. Pharmacy will ship medication directly to the office.  Patient NOT eligible for Prolia Copay Card. Copay Card can make patient's cost as little as $25. Link to apply: [email or web address]://[email or web address]  ** This summary of benefits is an estimation of the patient's out-of-pocket cost. Exact cost may very based on individual plan coverage.

## 2023-09-29 NOTE — Telephone Encounter (Signed)
 [name]

## 2023-09-29 NOTE — Telephone Encounter (Signed)
Prolia VOB initiated via [email or web address]  Next Prolia inj DUE: 10/19/23

## 2023-10-01 ENCOUNTER — Ambulatory Visit: Payer: Medicare Other | Admitting: Emergency Medicine

## 2023-10-01 ENCOUNTER — Encounter: Payer: Self-pay | Admitting: Emergency Medicine

## 2023-10-01 VITALS — BP 151/71 | HR 86 | Ht 64.0 in | Wt 132.8 lb

## 2023-10-01 DIAGNOSIS — K219 Gastro-esophageal reflux disease without esophagitis: Secondary | ICD-10-CM | POA: Diagnosis not present

## 2023-10-01 DIAGNOSIS — R911 Solitary pulmonary nodule: Secondary | ICD-10-CM

## 2023-10-01 DIAGNOSIS — R0602 Shortness of breath: Secondary | ICD-10-CM | POA: Diagnosis not present

## 2023-10-01 NOTE — Patient Instructions (Signed)
VISIT SUMMARY:  Today, we discussed your recent symptoms of chest discomfort and shortness of breath, which have been affecting your ability to exercise and walk. We reviewed your CT scan results and discussed the findings of a pulmonary nodule. We also talked about your laryngopharyngeal reflux and how you are managing it with dietary changes.  YOUR PLAN:  -PULMONARY NODULE: A pulmonary nodule is a small growth in the lung. Your CT scan showed an 8mm nodule in the right upper lobe that appears to have evolved compared with prior scan in 2021, which may be a slow-growing type of lung cancer called a highly differentiated adenocarcinoma. We will order a PET scan to check for any spread of the nodule and repeat pulmonary function tests to see if your lung function has changed and to determine if you might need surgery to remove the nodule.  Alternatively we may decide to do a different procedure to obtain a biopsy of the nodule if surgery is not desired.  -EXERTIONAL DYSPNEA: Exertional dyspnea means shortness of breath during physical activity. This new symptom has been limiting your exercise. We will perform pulmonary function testing to evaluate further.  Your cardiac testing has been reassuring.  -LARYNGOPHARYNGEAL REFLUX: Laryngopharyngeal reflux is when stomach acid backs up into the throat, causing symptoms like throat clearing and hoarseness. You are currently managing this with dietary changes since the medication caused side effects.  INSTRUCTIONS:  Please follow up for a review of your PET scan and pulmonary function test results. This will help [place] decide the next steps, including whether you might need surgery to remove the lung nodule.

## 2023-10-01 NOTE — Assessment & Plan Note (Signed)
Laryngopharyngeal Reflux Recent diagnosis, currently not on medication due to side effects. -Continue dietary modifications as tolerated.

## 2023-10-01 NOTE — Assessment & Plan Note (Signed)
Exertional Dyspnea New onset, significant enough to limit exercise tolerance. No clear cardiac etiology per recent testing. -Cause unclear but will start evaluation with pulmonary function testing
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
## 2023-10-01 NOTE — Assessment & Plan Note (Signed)
Pulmonary Nodule 8mm right upper lobe nodule noted on CT scan from 08/27/2023, with subtle presence on previous CT from 12/14/2019. Suspicion for slow-growing highly differentiated adenocarcinoma due to subtle changes over time. -Order PET scan to evaluate for possible metastasis, ensure stage I disease. -Repeat pulmonary function testing to assess for changes since 12/27/2019 and to evaluate candidacy for possible lung resection.  Follow-up Review results of PET scan and pulmonary function testing to determine next steps, including possible surgical consultation for lung resection.

## 2023-10-02 ENCOUNTER — Telehealth: Payer: Self-pay | Admitting: Emergency Medicine

## 2023-10-02 NOTE — Telephone Encounter (Signed)
Called patient.  Patient states she has NOT had the PET scan yet.  It is scheduled for next week.  Patient just wanted to let us know that when she does have the scan, she would like [place] to call her to get the results.  Informed patient will will call her with the results once available.

## 2023-10-02 NOTE — Telephone Encounter (Signed)
PT would like PET scan results when in. Adv it takes time to get results back and Dr. Laury Axon to read first

## 2023-10-07 ENCOUNTER — Telehealth: Payer: Self-pay | Admitting: Emergency Medicine

## 2023-10-07 ENCOUNTER — Ambulatory Visit: Payer: Medicare Other | Admitting: Emergency Medicine

## 2023-10-07 ENCOUNTER — Encounter: Payer: Self-pay | Admitting: Emergency Medicine

## 2023-10-07 NOTE — Telephone Encounter (Signed)
 Lm x1 for patient.   Dr.Byrum, please advise if okay for patient to take Synthroid prior to PET.

## 2023-10-07 NOTE — Telephone Encounter (Signed)
 PT ret call. I read her Dr. Kavin Leech notes. NFN

## 2023-10-07 NOTE — Telephone Encounter (Signed)
 Patient has a PET Scan in the morning and is wondering if she should take her Synthroid. [phone]

## 2023-10-07 NOTE — Telephone Encounter (Signed)
 No she should not need to hold her Synthroid for an FDG-PET, they do not use any iodine

## 2023-10-08 ENCOUNTER — Encounter (HOSPITAL_COMMUNITY)
Admission: RE | Admit: 2023-10-08 | Discharge: 2023-10-08 | Disposition: A | Discharge status: Home / Self Care | Payer: Medicare Other | Source: Ambulatory Visit | Attending: Emergency Medicine | Admitting: Emergency Medicine

## 2023-10-08 DIAGNOSIS — R918 Other nonspecific abnormal finding of lung field: Secondary | ICD-10-CM | POA: Diagnosis not present

## 2023-10-08 DIAGNOSIS — R911 Solitary pulmonary nodule: Secondary | ICD-10-CM | POA: Insufficient documentation

## 2023-10-08 LAB — GLUCOSE, CAPILLARY: Glucose-Capillary: 95 mg/dL (ref 70–99)

## 2023-10-08 MED ORDER — FLUDEOXYGLUCOSE F - 18 (FDG) INJECTION
6.3000 | Freq: Once | INTRAVENOUS | Status: AC
  Administered 2023-10-08: 6.58 via INTRAVENOUS

## 2023-10-09 NOTE — Telephone Encounter (Signed)
 PET scan already completed. Nothing further needed at this time.

## 2023-10-14 ENCOUNTER — Telehealth: Payer: Self-pay | Admitting: Emergency Medicine

## 2023-10-14 NOTE — Telephone Encounter (Signed)
 Per PT's [facility] message and her call today please call her w/PET scan results once in. Her # is [phone]

## 2023-10-19 ENCOUNTER — Other Ambulatory Visit: Payer: Medicare Other

## 2023-10-19 DIAGNOSIS — Z5181 Encounter for therapeutic drug level monitoring: Secondary | ICD-10-CM

## 2023-10-19 DIAGNOSIS — R7301 Impaired fasting glucose: Secondary | ICD-10-CM

## 2023-10-19 DIAGNOSIS — E782 Mixed hyperlipidemia: Secondary | ICD-10-CM | POA: Diagnosis not present

## 2023-10-19 DIAGNOSIS — M810 Age-related osteoporosis without current pathological fracture: Secondary | ICD-10-CM | POA: Diagnosis not present

## 2023-10-19 LAB — LIPID PANEL

## 2023-10-20 ENCOUNTER — Encounter: Payer: Self-pay | Admitting: Emergency Medicine

## 2023-10-20 LAB — GLUCOSE, RANDOM: Glucose: 94 mg/dL (ref 70–99)

## 2023-10-20 LAB — LIPID PANEL
Cholesterol, Total: 140 mg/dL (ref 100–199)
HDL: 53 mg/dL (ref >39)
LDL CALC COMMENT:: 2.6 ratio (ref 0.0–4.4)
LDL Chol Calc (NIH): 62 mg/dL (ref 0–99)
Triglycerides: 144 mg/dL (ref 0–149)
VLDL Cholesterol Cal: 25 mg/dL (ref 5–40)

## 2023-10-20 LAB — HEMOGLOBIN A1C
Est. average glucose Bld gHb Est-mCnc: 128 mg/dL
Hgb A1c MFr Bld: 6.1 % — ABNORMAL HIGH (ref 4.8–5.6)

## 2023-10-21 NOTE — Telephone Encounter (Signed)
 Adv PT no results in yet.

## 2023-10-30 ENCOUNTER — Ambulatory Visit (HOSPITAL_BASED_OUTPATIENT_CLINIC_OR_DEPARTMENT_OTHER): Payer: Medicare Other | Admitting: Emergency Medicine

## 2023-10-30 ENCOUNTER — Encounter: Payer: Self-pay | Admitting: Emergency Medicine

## 2023-10-30 ENCOUNTER — Ambulatory Visit: Payer: Medicare Other | Admitting: Emergency Medicine

## 2023-10-30 VITALS — BP 129/72 | HR 78 | Ht 64.25 in | Wt 132.8 lb

## 2023-10-30 DIAGNOSIS — R911 Solitary pulmonary nodule: Secondary | ICD-10-CM

## 2023-10-30 DIAGNOSIS — R0602 Shortness of breath: Secondary | ICD-10-CM

## 2023-10-30 LAB — PULMONARY FUNCTION TEST
DL/VA % pred: 98 %
DL/VA: 4 ml/min/mmHg/L
DLCO cor % pred: 73 %
DLCO cor: 13.99 ml/min/mmHg
DLCO unc % pred: 73 %
DLCO unc: 13.99 ml/min/mmHg
FEF 25-75 Post: 3.23 L/s
FEF 25-75 Pre: 2.68 L/s
FEF2575-%Change-Post: 20 %
FEF2575-%Pred-Post: 205 %
FEF2575-%Pred-Pre: 170 %
FEV1-%Change-Post: 4 %
FEV1-%Pred-Post: 98 %
FEV1-%Pred-Pre: 94 %
FEV1-Post: 2.05 L
FEV1-Pre: 1.96 L
FEV1FVC-%Change-Post: 1 %
FEV1FVC-%Pred-Pre: 114 %
FEV6-%Change-Post: 2 %
FEV6-%Pred-Post: 89 %
FEV6-%Pred-Pre: 87 %
FEV6-Post: 2.36 L
FEV6-Pre: 2.3 L
FEV6FVC-%Pred-Post: 105 %
FEV6FVC-%Pred-Pre: 105 %
FVC-%Change-Post: 2 %
FVC-%Pred-Post: 85 %
FVC-%Pred-Pre: 83 %
FVC-Post: 2.36 L
FVC-Pre: 2.3 L
Post FEV1/FVC ratio: 87 %
Post FEV6/FVC ratio: 100 %
Pre FEV1/FVC ratio: 85 %
Pre FEV6/FVC Ratio: 100 %
RV % pred: 70 %
RV: 1.64 L
TLC % pred: 81 %
TLC: 4.17 L

## 2023-10-30 MED ORDER — ALBUTEROL SULFATE HFA 108 (90 BASE) MCG/ACT IN AERS: 2.0000 | INHALATION_SPRAY | RESPIRATORY_TRACT | 6 refills | Status: AC | PRN

## 2023-10-30 NOTE — Patient Instructions (Addendum)
 We reviewed your pulmonary function testing and your PET scan today. We will try starting albuterol 2 puffs prior to exercise see if this helps your breathing and your function capacity. If you continue to have shortness of breath with exertion we may need to discuss with Dr. Jacinto Halim, see if he wants to do any further cardiac workup We will plan for a navigational bronchoscopy to evaluate a small right upper lobe pulmonary nodule.  This will be done under general anesthesia as an outpatient at [facility] endoscopy.  You will need a designated driver and someone to watch you that day after the procedure.  Will try to get this scheduled for 11/09/2023.  Please stop your aspirin 2 days prior to the procedure. Follow Dr. Delton Coombes in 1 month or next available opening so we can discuss how your breathing is doing using the albuterol

## 2023-10-30 NOTE — Patient Instructions (Signed)
 Full PFT Performed Today

## 2023-10-30 NOTE — Assessment & Plan Note (Signed)
  We will plan for a navigational bronchoscopy to evaluate a small right upper lobe pulmonary nodule.  This will be done under general anesthesia as an outpatient at [facility] endoscopy.  You will need a designated driver and someone to watch you that day after the procedure.  Will try to get this scheduled for 11/09/2023.  Please stop your aspirin 2 days prior to the procedure. Follow Dr. Delton Coombes in 1 month or next available opening so we can discuss how your breathing is doing using the albuterol
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
## 2023-10-30 NOTE — Progress Notes (Signed)
 Full PFT Performed Today

## 2023-10-30 NOTE — Assessment & Plan Note (Signed)
 We reviewed your pulmonary function testing We will try starting albuterol 2 puffs prior to exercise see if this helps your breathing and your function capacity. If you continue to have shortness of breath with exertion we may need to discuss with Dr. Jacinto Halim, see if he wants to do any further cardiac workup

## 2023-11-01 ENCOUNTER — Encounter: Payer: Self-pay | Admitting: Family Medicine

## 2023-11-02 ENCOUNTER — Encounter: Payer: Medicare Other | Admitting: Family Medicine

## 2023-11-02 DIAGNOSIS — Z23 Encounter for immunization: Secondary | ICD-10-CM

## 2023-11-02 DIAGNOSIS — E039 Hypothyroidism, unspecified: Secondary | ICD-10-CM

## 2023-11-02 DIAGNOSIS — I700 Atherosclerosis of aorta: Secondary | ICD-10-CM

## 2023-11-02 DIAGNOSIS — R7301 Impaired fasting glucose: Secondary | ICD-10-CM

## 2023-11-02 DIAGNOSIS — E782 Mixed hyperlipidemia: Secondary | ICD-10-CM

## 2023-11-02 DIAGNOSIS — I10 Essential (primary) hypertension: Secondary | ICD-10-CM

## 2023-11-03 NOTE — Progress Notes (Unsigned)
 No chief complaint on file.  Patient presents for 6 month follow-up on chronic problems. See below for labs done prior to visit.  Impaired fasting glucose.  She continues to avoid sugar and limit carbs in her diet.  2 dark chocolate candy kisses after dinner, no longer daily. She is drinking Klean wine (very little sugar), up to 4-5 days/week, just 1 glass.. No other sugary beverages.  Drinks unsweet tea, uses zero-sugar creamer in her decaf coffee.  Uses a small amount of sugar in her coffee (switched from Splenda, per nutritionist).   Hypertension follow-up: She is compliant with losartan-HCT and amlodipine. These are prescribed by Dr. Jacinto Halim.  BP elsewhere ***  BP Readings from Last 3 Encounters:  10/30/23 129/72  10/01/23 (!) 151/71  09/14/23 132/74     Denies lightheadedness, syncope, headaches. Denies side effects of medications. She denies exertional chest pain.  Recent DOE --see below. Previously only noted it when walking outside in humidity. She has known atherosclerosis and is on statin.  There is FHx of CAD, no personal history.   Hyperlipidemia follow-up:  Patient is reportedly following a low-fat, low cholesterol diet.  Compliant with medications (atorvastatin 40mg ) and denies medication side effects.  She was switched from simvastatin to atorvastatin (due to potential interaction with amlodipine), with atorvastatin dose being increased to 40mg  in May 2024, to help get TG to goal.   She remains very careful with her diet.   H/o DVT-- s/p 6 months of anticoagulation with xarelto. She was discharged from the care of hematologist.  If any recurrence, will need lifelong treatment.    She is taking 81mg  of ASA daily. (factor V Leiden heterozygous). No other blood thinning agents were recommended.  Denies any swelling or pain in LE's.  Denies any bleeding/bruising.  Denies any abdominal pain.  She was last seen by me in 08/2023 with complaints of DOE.  She had elevated D-dimer in  08/2023. CT-angio 08/27/23 was negative for PE. IMPRESSION: 1. No evidence of pulmonary embolism or other acute process. 2. 8 mm right upper lobe pulmonary nodule. Non-contrast chest CT at 6-12 months is recommended. If the nodule is stable at time of repeat CT, then future CT at 18-24 months (from today's scan) is considered optional for low-risk patients, but is recommended for high-risk patients. This recommendation follows the consensus statement: Guidelines for Management of Incidental Pulmonary Nodules Detected on CT Images: From the Fleischner Society 2017; Radiology 2017; 284:228-243. 3. Coronary artery calcifications. 4. Aortic atherosclerosis.  She had follow-up with Dr. Jacinto Halim, and stress test was done. Stress EKG is positive for myocardial ischemia, very similar in 2021 and nuclear perfusion was completely normal.  He did not suspect significant coronary artery disease, recommended continued medical therapy. If marked dyspnea persists that is not explained by her pulmonary status, consider right and left heart catheterization. He referred her to pulmonary for PFT's and follow-up of nodule.  Pulmonary nodule--per Dr. Kavin Leech initial evaluation, he reported 8mm right upper lobe nodule noted on CT scan from 08/27/2023, with subtle presence on previous CT from 12/14/2019. Suspicion for slow-growing highly differentiated adenocarcinoma due to subtle changes over time.  He ordered PET scan, which was done 09/2023: IMPRESSION: 1. Stable sub solid right upper lobe pulmonary nodule which demonstrates low level hypermetabolism. It is possible this is an inflammatory lesion but I do not see any significant regression since the prior chest CT from January and a low-grade adenocarcinoma is certainly possible. Recommend continued close surveillance with a repeat  noncontrast chest CT in 4-6 months. 2. No enlarged or hypermetabolic mediastinal or hilar lymph nodes. 3. No findings for metastatic  disease involving the abdomen/pelvis or bony structures. 4. Stable partially calcified periurethral/bladder neck mass unchanged since 2017. 5. Two large bladder calculi measuring 15 mm each. 6. Aortic atherosclerosis.  She is scheduled for bronchoscopy to evaluate the nodule on 3/31 with Dr. Delton Coombes. He also recommended using albuterol 2 puffs prior to exercise. Advised if DOE persists, to discuss need for further cardiac evaluation with Dr. Jacinto Halim.   MNG--denies symptoms/dysphagia or changes to her neck.  She had benign biopsies in 2020. Last [place] was in 01/2020, no nodules meeting criteria for biopsy, heterogenous thyroid.  Only has a problem sometimes swallowing the large calcium pills.   Hypothyroidism:  She remains on Synthroid . She reports compliance with medication, taking on an empty stomach, separate from other medications.  She denies changes to hair/skin/bowels/energy.  Slight dry skin (chronic). Lab Results  Component Value Date   TSH 2.320 08/27/2023     Osteoporosis:  She was started on Boniva in May 2017 (DEXA done 12/10/15 showed T -2.5 at R fem neck). She tolerated it without side effects, but didn't show any improvement (01/2018 T-2.3 at R fem neck, 01/2020 T-2.5 at R fem neck). She was then changed from Boniva to Prolia injections, first injection 02/2020, last was 10/19/23. She denies side effects. Last DEXA (03/2022)-- T-2.5 R fem neck, -2.0 L wrist Vitamin D-OH level was 51.5 in 03/2022.  She takes Calcium with D once daily.  Gets regular weight-bearing exercise at the gym at least 2x/week.    GERD:  Has some reflux related to her diet.  Has had symptoms related to [place] food, [employer]. Nexium OTC works well, but hasn't needed any in a long time.  Denies dysphagia.   PMH, PSH, SH reviewed   ROS: No f/c, URI symptoms, cough. DOE per HPI No chest pain, palpitations, edema. No nausea, vomiting, heartburn or change in bowels. Moods are good, somewhat anxious. See  HPI    PHYSICAL EXAM:  LMP  (LMP Unknown)   Wt Readings from Last 3 Encounters:  10/30/23 132 lb 12.8 oz (60.2 kg)  10/30/23 132 lb 12.8 oz (60.2 kg)  10/01/23 132 lb 12.8 oz (60.2 kg)   Well-appearing female, speaking comfortably, in no distress HEENT: conjunctiva and sclera are clear, EOMI. OP clear Neck: no lymphadenopathy or mass. Thyroid is mildly enlarged, no dominant mass/nodule (known multinodular goiter) Heart: regular rate and rhythm, no murmur Lungs: clear bilaterally, no wheezes, rales, ronchi, normal air movement Abdomen: soft, nontender, no organomegaly or mass Back: no spinal or CVA tenderness Extremities: 2+ pulses, no edema, calves nontender Neuro: alert and oriented, cranial nerves intact, normal gait. Psych: normal mood, affect, hygiene and grooming   Lab Results  Component Value Date   HGBA1C 6.1 (H) 10/19/2023   Fasting glucose 94  Lab Results  Component Value Date   CHOL 140 10/19/2023   HDL 53 10/19/2023   LDLCALC 62 10/19/2023   TRIG 144 10/19/2023   CHOLHDL 2.6 10/19/2023       ASSESSMENT/PLAN:  [facility]u osteoporosis  Need to send copy of recent lipids to Dr. Jacinto Halim

## 2023-11-04 ENCOUNTER — Ambulatory Visit (INDEPENDENT_AMBULATORY_CARE_PROVIDER_SITE_OTHER): Admitting: Family Medicine

## 2023-11-04 ENCOUNTER — Other Ambulatory Visit: Payer: Self-pay | Admitting: Family Medicine

## 2023-11-04 ENCOUNTER — Encounter: Payer: Self-pay | Admitting: Family Medicine

## 2023-11-04 VITALS — BP 128/70 | HR 72 | Ht 64.25 in | Wt 134.0 lb

## 2023-11-04 DIAGNOSIS — R0609 Other forms of dyspnea: Secondary | ICD-10-CM

## 2023-11-04 DIAGNOSIS — E039 Hypothyroidism, unspecified: Secondary | ICD-10-CM | POA: Diagnosis not present

## 2023-11-04 DIAGNOSIS — M810 Age-related osteoporosis without current pathological fracture: Secondary | ICD-10-CM

## 2023-11-04 DIAGNOSIS — R911 Solitary pulmonary nodule: Secondary | ICD-10-CM

## 2023-11-04 DIAGNOSIS — R7301 Impaired fasting glucose: Secondary | ICD-10-CM

## 2023-11-04 DIAGNOSIS — E782 Mixed hyperlipidemia: Secondary | ICD-10-CM | POA: Diagnosis not present

## 2023-11-04 DIAGNOSIS — I10 Essential (primary) hypertension: Secondary | ICD-10-CM | POA: Diagnosis not present

## 2023-11-04 DIAGNOSIS — I700 Atherosclerosis of aorta: Secondary | ICD-10-CM

## 2023-11-04 DIAGNOSIS — Z1231 Encounter for screening mammogram for malignant neoplasm of breast: Secondary | ICD-10-CM

## 2023-11-04 DIAGNOSIS — Z23 Encounter for immunization: Secondary | ICD-10-CM | POA: Diagnosis not present

## 2023-11-04 NOTE — Telephone Encounter (Signed)
 NFN

## 2023-11-05 ENCOUNTER — Other Ambulatory Visit: Payer: Self-pay

## 2023-11-05 ENCOUNTER — Encounter (HOSPITAL_COMMUNITY): Payer: Self-pay | Admitting: Emergency Medicine

## 2023-11-05 ENCOUNTER — Encounter: Payer: Self-pay | Admitting: Family Medicine

## 2023-11-05 MED ORDER — ATORVASTATIN CALCIUM 40 MG PO TABS
40.0000 mg | ORAL_TABLET | Freq: Every day | ORAL | 1 refills | Status: DC
Start: 2023-11-05 — End: 2024-06-10

## 2023-11-05 NOTE — Progress Notes (Signed)
 SDW CALL  Patient was given pre-op instructions over the phone. The opportunity was given for the patient to ask questions. No further questions asked. Patient verbalized understanding of instructions given.   PCP - Joselyn Arrow Cardiologist - Dr. Yates Decamp  PPM/ICD - denies Device Orders - n/a Rep Notified - n/a  Chest x-ray -  EKG - 09/14/23 Stress Test - 09/21/23 ECHO - 06/19/20 Cardiac Cath -   Sleep Study - n/a CPAP -   No DM  Last dose of GLP1 agonist-  n/a GLP1 instructions:  n/a  Blood Thinner Instructions: n/a Aspirin Instructions: hold 2 days prior - last dose 3/28  ERAS Protcol - NPO   COVID TEST-  n/a   Anesthesia review:  yes - cards clearance  Patient denies shortness of breath, fever, cough and chest pain over the phone call   All instructions explained to the patient, with a verbal understanding of the material. Patient agrees to go over the instructions while at home for a better understanding.
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
## 2023-11-06 NOTE — Anesthesia Preprocedure Evaluation (Addendum)
 Anesthesia Evaluation  Patient identified by MRN, date of birth, ID band Patient awake    Reviewed: Allergy & Precautions, NPO status , Patient's Chart, lab work & pertinent test results, reviewed documented beta blocker date and time   History of Anesthesia Complications Negative for: history of anesthetic complications  Airway Mallampati: II  TM Distance: >3 FB     Dental no notable dental hx.    Pulmonary shortness of breath and with exertion, neg COPD, neg recent URI, former smoker   breath sounds clear to auscultation       Cardiovascular hypertension, (-) angina (-) CAD, (-) Past MI and (-) Cardiac Stents + Valvular Problems/Murmurs  Rhythm:Regular Rate:Normal     Neuro/Psych neg Seizures    GI/Hepatic ,GERD  Medicated and Controlled,,(+) neg Cirrhosis        Endo/Other  Hypothyroidism    Renal/GU Renal disease     Musculoskeletal  (+) Arthritis ,    Abdominal   Peds  Hematology  (+) Blood dyscrasia, anemia   Anesthesia Other Findings   Reproductive/Obstetrics                             Anesthesia Physical Anesthesia Plan  ASA: 2  Anesthesia Plan: General   Post-op Pain Management:    Induction: Intravenous  PONV Risk Score and Plan: 3 and Ondansetron and Dexamethasone  Airway Management Planned: Oral ETT  Additional Equipment:   Intra-op Plan:   Post-operative Plan: Extubation in OR  Informed Consent: I have reviewed the patients History and Physical, chart, labs and discussed the procedure including the risks, benefits and alternatives for the proposed anesthesia with the patient or authorized representative who has indicated his/her understanding and acceptance.     Dental advisory given  Plan Discussed with: CRNA  Anesthesia Plan Comments: (PAT note written 11/06/2023 by Shonna Chock, PA-C.  )       Anesthesia Quick Evaluation

## 2023-11-09 ENCOUNTER — Ambulatory Visit (HOSPITAL_COMMUNITY)
Admission: RE | Admit: 2023-11-09 | Discharge: 2023-11-09 | Disposition: A | Discharge status: Home / Self Care | Source: Ambulatory Visit | Attending: Emergency Medicine | Admitting: Emergency Medicine

## 2023-11-09 ENCOUNTER — Ambulatory Visit (HOSPITAL_BASED_OUTPATIENT_CLINIC_OR_DEPARTMENT_OTHER): Payer: Self-pay | Admitting: Vascular Surgery

## 2023-11-09 ENCOUNTER — Ambulatory Visit (HOSPITAL_COMMUNITY): Payer: Self-pay | Admitting: Vascular Surgery

## 2023-11-09 ENCOUNTER — Encounter (HOSPITAL_COMMUNITY): Payer: Self-pay | Admitting: Emergency Medicine

## 2023-11-09 ENCOUNTER — Ambulatory Visit (HOSPITAL_COMMUNITY)

## 2023-11-09 ENCOUNTER — Other Ambulatory Visit: Payer: Self-pay

## 2023-11-09 ENCOUNTER — Encounter (HOSPITAL_COMMUNITY)
Admission: RE | Disposition: A | Discharge status: Home / Self Care | Payer: Self-pay | Source: Ambulatory Visit | Attending: Emergency Medicine

## 2023-11-09 DIAGNOSIS — R911 Solitary pulmonary nodule: Secondary | ICD-10-CM | POA: Diagnosis not present

## 2023-11-09 DIAGNOSIS — Z87891 Personal history of nicotine dependence: Secondary | ICD-10-CM | POA: Diagnosis not present

## 2023-11-09 DIAGNOSIS — I10 Essential (primary) hypertension: Secondary | ICD-10-CM | POA: Diagnosis not present

## 2023-11-09 DIAGNOSIS — Z48813 Encounter for surgical aftercare following surgery on the respiratory system: Secondary | ICD-10-CM | POA: Diagnosis not present

## 2023-11-09 DIAGNOSIS — Z86718 Personal history of other venous thrombosis and embolism: Secondary | ICD-10-CM | POA: Diagnosis not present

## 2023-11-09 DIAGNOSIS — D649 Anemia, unspecified: Secondary | ICD-10-CM | POA: Diagnosis not present

## 2023-11-09 DIAGNOSIS — E039 Hypothyroidism, unspecified: Secondary | ICD-10-CM

## 2023-11-09 DIAGNOSIS — Z96652 Presence of left artificial knee joint: Secondary | ICD-10-CM | POA: Diagnosis not present

## 2023-11-09 DIAGNOSIS — Z79899 Other long term (current) drug therapy: Secondary | ICD-10-CM | POA: Insufficient documentation

## 2023-11-09 DIAGNOSIS — R0602 Shortness of breath: Secondary | ICD-10-CM | POA: Insufficient documentation

## 2023-11-09 DIAGNOSIS — Z79890 Hormone replacement therapy (postmenopausal): Secondary | ICD-10-CM | POA: Insufficient documentation

## 2023-11-09 DIAGNOSIS — M1990 Unspecified osteoarthritis, unspecified site: Secondary | ICD-10-CM | POA: Diagnosis not present

## 2023-11-09 DIAGNOSIS — E785 Hyperlipidemia, unspecified: Secondary | ICD-10-CM | POA: Diagnosis not present

## 2023-11-09 DIAGNOSIS — K219 Gastro-esophageal reflux disease without esophagitis: Secondary | ICD-10-CM | POA: Diagnosis not present

## 2023-11-09 HISTORY — DX: Dyspnea, unspecified: R06.00

## 2023-11-09 HISTORY — PX: BRONCHIAL WASHINGS: SHX5105

## 2023-11-09 HISTORY — PX: VIDEO BRONCHOSCOPY WITH ENDOBRONCHIAL NAVIGATION: SHX6175

## 2023-11-09 HISTORY — DX: Personal history of pneumonia (recurrent): Z87.01

## 2023-11-09 HISTORY — PX: BRONCHIAL NEEDLE ASPIRATION BIOPSY: SHX5106

## 2023-11-09 HISTORY — PX: BRONCHIAL BRUSHINGS: SHX5108

## 2023-11-09 HISTORY — PX: FIDUCIAL MARKER PLACEMENT: SHX6858

## 2023-11-09 HISTORY — PX: BRONCHIAL BIOPSY: SHX5109

## 2023-11-09 LAB — CBC
HCT: 39.6 % (ref 36.0–46.0)
Hemoglobin: 13 g/dL (ref 12.0–15.0)
MCH: 33.5 pg (ref 26.0–34.0)
MCHC: 32.8 g/dL (ref 30.0–36.0)
MCV: 102.1 fL — ABNORMAL HIGH (ref 80.0–100.0)
Platelets: 228 10*3/uL (ref 150–400)
RBC: 3.88 MIL/uL (ref 3.87–5.11)
RDW: 13.8 % (ref 11.5–15.5)
WBC: 8.7 10*3/uL (ref 4.0–10.5)
nRBC: 0 % (ref 0.0–0.2)

## 2023-11-09 LAB — BASIC METABOLIC PANEL WITH GFR
Anion gap: 11 (ref 5–15)
BUN: 20 mg/dL (ref 8–23)
CO2: 25 mmol/L (ref 22–32)
Calcium: 9.5 mg/dL (ref 8.9–10.3)
Chloride: 106 mmol/L (ref 98–111)
Creatinine, Ser: 0.9 mg/dL (ref 0.44–1.00)
GFR, Estimated: >60 mL/min (ref >60)
Glucose, Bld: 112 mg/dL — ABNORMAL HIGH (ref 70–99)
Potassium: 3.4 mmol/L — ABNORMAL LOW (ref 3.5–5.1)
Sodium: 142 mmol/L (ref 135–145)

## 2023-11-09 SURGERY — VIDEO BRONCHOSCOPY WITH ENDOBRONCHIAL NAVIGATION
Anesthesia: General | Laterality: Right

## 2023-11-09 MED ORDER — ROCURONIUM BROMIDE 10 MG/ML (PF) SYRINGE
PREFILLED_SYRINGE | INTRAVENOUS | Status: DC | PRN
  Administered 2023-11-09: 50 mg via INTRAVENOUS

## 2023-11-09 MED ORDER — LACTATED RINGERS IV SOLN: INTRAVENOUS | Status: DC

## 2023-11-09 MED ORDER — CHLORHEXIDINE GLUCONATE 0.12 % MT SOLN
15.0000 mL | Freq: Once | OROMUCOSAL | Status: AC
  Administered 2023-11-09: 15 mL via OROMUCOSAL
  Filled 2023-11-09: qty 15

## 2023-11-09 MED ORDER — SUGAMMADEX SODIUM 200 MG/2ML IV SOLN
INTRAVENOUS | Status: DC | PRN
Start: 2023-11-09 — End: 2023-11-09
  Administered 2023-11-09: 200 mg via INTRAVENOUS

## 2023-11-09 MED ORDER — PROPOFOL 10 MG/ML IV BOLUS
INTRAVENOUS | Status: DC | PRN
  Administered 2023-11-09: 110 mg via INTRAVENOUS

## 2023-11-09 MED ORDER — DEXAMETHASONE SODIUM PHOSPHATE 10 MG/ML IJ SOLN
INTRAMUSCULAR | Status: DC | PRN
  Administered 2023-11-09: 5 mg via INTRAVENOUS

## 2023-11-09 MED ORDER — PHENYLEPHRINE HCL-NACL 20-0.9 MG/250ML-% IV SOLN
INTRAVENOUS | Status: DC | PRN
  Administered 2023-11-09: 30 ug/min via INTRAVENOUS

## 2023-11-09 MED ORDER — LIDOCAINE 2% (20 MG/ML) 5 ML SYRINGE
INTRAMUSCULAR | Status: DC | PRN
  Administered 2023-11-09: 100 mg via INTRAVENOUS

## 2023-11-09 MED ORDER — FENTANYL CITRATE (PF) 250 MCG/5ML IJ SOLN
INTRAMUSCULAR | Status: DC | PRN
  Administered 2023-11-09: 50 ug via INTRAVENOUS

## 2023-11-09 MED ORDER — FENTANYL CITRATE (PF) 100 MCG/2ML IJ SOLN
INTRAMUSCULAR | Status: AC
  Filled 2023-11-09: qty 2

## 2023-11-09 MED ORDER — ONDANSETRON HCL 4 MG/2ML IJ SOLN
INTRAMUSCULAR | Status: DC | PRN
  Administered 2023-11-09: 4 mg via INTRAVENOUS

## 2023-11-09 SURGICAL SUPPLY — 1 items: Super Lock Fiducial Marker IMPLANT

## 2023-11-09 NOTE — Transfer of Care (Signed)
 Immediate Anesthesia Transfer of Care Note  Patient: Dorothy Rojas  Procedure(s) Performed: VIDEO BRONCHOSCOPY WITH ENDOBRONCHIAL NAVIGATION (Right) BRONCHOSCOPY, WITH BRUSH BIOPSY BRONCHOSCOPY, WITH BIOPSY BRONCHOSCOPY, WITH NEEDLE ASPIRATION BIOPSY INSERTION, FIDUCIAL MARKERS IRRIGATION, BRONCHUS  Patient Location: PACU  Anesthesia Type:General  Level of Consciousness: awake and alert   Airway & Oxygen Therapy: Patient Spontanous Breathing and Patient connected to face mask oxygen  Post-op Assessment: Report given to RN and Post -op Vital signs reviewed and stable  Post vital signs: Reviewed and stable  Last Vitals:  Vitals Value Taken Time  BP 126/58 11/09/23 1247  Temp    Pulse 73 11/09/23 1248  Resp 10 11/09/23 1248  SpO2 97 % 11/09/23 1248  Vitals shown include unfiled device data.  Last Pain:  Vitals:   11/09/23 0913  TempSrc:   PainSc: 0-No pain         Complications: No notable events documented.

## 2023-11-09 NOTE — Interval H&P Note (Signed)
 History and Physical Interval Note:  11/09/2023 11:21 AM  Dorothy Rojas  has presented today for surgery, with the diagnosis of RIGT UPPER LOBE NODULE.  The various methods of treatment have been discussed with the patient and family. After consideration of risks, benefits and other options for treatment, the patient has consented to  Procedure(s) with comments: VIDEO BRONCHOSCOPY WITH ENDOBRONCHIAL NAVIGATION (Right) - WITH FLOURO as a surgical intervention.  The patient's history has been reviewed, patient examined, no change in status, stable for surgery.  I have reviewed the patient's chart and labs.  Questions were answered to the patient's satisfaction.     Leslye Peer

## 2023-11-09 NOTE — Anesthesia Postprocedure Evaluation (Signed)
 Anesthesia Post Note  Patient: [name]  Procedure(s) Performed: VIDEO BRONCHOSCOPY WITH ENDOBRONCHIAL NAVIGATION (Right) BRONCHOSCOPY, WITH BRUSH BIOPSY BRONCHOSCOPY, WITH BIOPSY BRONCHOSCOPY, WITH NEEDLE ASPIRATION BIOPSY INSERTION, FIDUCIAL MARKERS IRRIGATION, BRONCHUS     Patient location during evaluation: PACU Anesthesia Type: General Level of consciousness: awake and alert Pain management: pain level controlled Vital Signs Assessment: post-procedure vital signs reviewed and stable Respiratory status: spontaneous breathing, nonlabored ventilation, respiratory function stable and patient connected to nasal cannula oxygen Cardiovascular status: blood pressure returned to baseline and stable Postop Assessment: no apparent nausea or vomiting Anesthetic complications: no  No notable events documented.  Last Vitals:  Vitals:   11/09/23 1315 11/09/23 1326  BP: 113/63 114/73  Pulse: 75 76  Resp: 17 18  Temp:    SpO2: 100% 99%    Last Pain:  Vitals:   11/09/23 1315  TempSrc:   PainSc: 0-No pain                 Shelton Silvas

## 2023-11-09 NOTE — Discharge Instructions (Addendum)
 Flexible Bronchoscopy, Care After This sheet gives you information about how to care for yourself after your test. Your doctor may also give you more specific instructions. If you have problems or questions, contact your doctor. Follow these instructions at home: Eating and drinking When your numbness is gone and your cough and gag reflexes have come back, you may: Eat only soft foods. Slowly drink liquids. When you get home after the test, go back to your normal diet. Driving Do not drive for 24 hours if you were given a medicine to help you relax (sedative). Do not drive or use heavy machinery while taking prescription pain medicine. General instructions  Take over-the-counter and prescription medicines only as told by your doctor. Return to your normal activities as told. Ask what activities are safe for you. Do not use any products that have nicotine or tobacco in them. This includes cigarettes and e-cigarettes. If you need help quitting, ask your doctor. Keep all follow-up visits as told by your doctor. This is important. It is very important if you had a tissue sample (biopsy) taken. Get help right away if: You have shortness of breath that gets worse. You get light-headed. You feel like you are going to pass out (faint). You have chest pain. You cough up: More than a little blood. More blood than before. Summary Do not eat or drink anything (not even water) for 2 hours after your test, or until your numbing medicine wears off. Do not use cigarettes. Do not use e-cigarettes. Get help right away if you have chest pain.  Please call our office for any questions or concerns.  [phone]. You can restart your aspirin on 11/10/2023   This information is not intended to replace advice given to you by your health care provider. Make sure you discuss any questions you have with your health care provider. Document Released: 05/25/2009 Document Revised: 07/10/2017 Document Reviewed:  08/15/2016 Elsevier Patient Education  2020 [employer].

## 2023-11-09 NOTE — Op Note (Signed)
 Video Bronchoscopy with Robotic Assisted Bronchoscopic Navigation   Date of Operation: 11/09/2023   Pre-op Diagnosis: Right upper lobe nodule  Post-op Diagnosis: Same  Surgeon: Levy Pupa  Assistants: None  Anesthesia: General endotracheal anesthesia  Operation: Flexible video fiberoptic bronchoscopy with robotic assistance and biopsies.  Estimated Blood Loss: Minimal  Complications: None  Indications and History: Dorothy Rojas is a 78 y.o. female with history of tobacco use.  She has an enlarging subsolid right upper lobe pulmonary nodule.  Recommendation made to achieve a tissue diagnosis via robotic assisted navigational bronchoscopy. The risks, benefits, complications, treatment options and expected outcomes were discussed with the patient.  The possibilities of pneumothorax, pneumonia, reaction to medication, pulmonary aspiration, perforation of a viscus, bleeding, failure to diagnose a condition and creating a complication requiring transfusion or operation were discussed with the patient who freely signed the consent.    Description of Procedure: The patient was seen in the Preoperative Area, was examined and was deemed appropriate to proceed.  The patient was taken to [facility] endoscopy room 3, identified as Dorothy Rojas and the procedure verified as Flexible Video Fiberoptic Bronchoscopy.  A Time Out was held and the above information confirmed.   Prior to the date of the procedure a high-resolution CT scan of the chest was performed. Utilizing ION software program a virtual tracheobronchial tree was generated to allow the creation of distinct navigation pathways to the patient's parenchymal abnormalities. After being taken to the operating room general anesthesia was initiated and the patient  was orally intubated. The video fiberoptic bronchoscope was introduced via the endotracheal tube and a general inspection was performed which showed normal right and left lung anatomy.  Aspiration of the bilateral mainstems was completed to remove any remaining secretions. Robotic catheter inserted into patient's endotracheal tube.   Target #1 right upper lobe nodule: The distinct navigation pathways prepared prior to this procedure were then utilized to navigate to patient's lesion identified on CT scan. The robotic catheter was secured into place and the vision probe was withdrawn.  Lesion location was approximated using fluoroscopy.  Local registration and targeting was performed using Cios three-dimensional imaging.  Needle in lesion was confirmed with Cios. Under fluoroscopic guidance transbronchial brushings, transbronchial needle biopsies, and transbronchial forceps biopsies were performed to be sent for cytology and pathology.  Under fluoroscopic guidance a single fiducial marker was placed adjacent to the nodule.  A bronchioalveolar lavage was performed in the right upper lobe and sent for cytology.   At the end of the procedure a general airway inspection was performed and there was no evidence of active bleeding. The bronchoscope was removed.  The patient tolerated the procedure well. There was no significant blood loss and there were no obvious complications. A post-procedural chest x-ray is pending.  Samples Target #1: 1. Transbronchial needle brushings from right upper lobe nodule 2. Transbronchial Wang needle biopsies from right upper lobe nodule 3. Transbronchial forceps biopsies from right upper lobe nodule 4. Bronchoalveolar lavage from right upper lobe    Plans:  The patient will be discharged from the PACU to home when recovered from anesthesia and after chest x-ray is reviewed. We will review the cytology, pathology and microbiology results with the patient when they become available. Outpatient followup will be with Saralyn Pilar, NP and Dr. Delton Coombes.    Levy Pupa, MD, PhD 11/09/2023, 12:44 PM [facility] [phone] or if no answer  before 7:00PM call [phone] For any issues after 7:00PM  please call eLink [phone]

## 2023-11-09 NOTE — Anesthesia Procedure Notes (Signed)
 Procedure Name: Intubation Date/Time: 11/09/2023 11:35 AM  Performed by: Loleta Crestina Strike, CRNAPre-anesthesia Checklist: Patient identified, Patient being monitored, Timeout performed, Emergency Drugs available and Suction available Patient Re-evaluated:Patient Re-evaluated prior to induction Oxygen Delivery Method: Circle system utilized Preoxygenation: Pre-oxygenation with 100% oxygen Induction Type: IV induction Ventilation: Mask ventilation without difficulty Laryngoscope Size: Mac and 4 Grade View: Grade I Tube type: Oral Tube size: 8.5 mm Number of attempts: 1 Airway Equipment and Method: Stylet Placement Confirmation: ETT inserted through vocal cords under direct vision, positive ETCO2 and breath sounds checked- equal and bilateral Secured at: 21 cm Tube secured with: Tape Dental Injury: Teeth and Oropharynx as per pre-operative assessment

## 2023-11-10 ENCOUNTER — Encounter (HOSPITAL_COMMUNITY): Payer: Self-pay | Admitting: Emergency Medicine

## 2023-11-10 LAB — CYTOLOGY - NON PAP

## 2023-11-13 ENCOUNTER — Ambulatory Visit: Payer: Medicare Other | Admitting: Emergency Medicine

## 2023-11-16 ENCOUNTER — Encounter: Payer: Self-pay | Admitting: Acute Care

## 2023-11-16 ENCOUNTER — Telehealth: Payer: Self-pay | Admitting: Cardiology

## 2023-11-16 ENCOUNTER — Ambulatory Visit (INDEPENDENT_AMBULATORY_CARE_PROVIDER_SITE_OTHER): Admitting: Acute Care

## 2023-11-16 VITALS — BP 118/67 | HR 76 | Ht 64.0 in | Wt 131.8 lb

## 2023-11-16 DIAGNOSIS — R911 Solitary pulmonary nodule: Secondary | ICD-10-CM

## 2023-11-16 DIAGNOSIS — Z9889 Other specified postprocedural states: Secondary | ICD-10-CM | POA: Diagnosis not present

## 2023-11-16 DIAGNOSIS — R0609 Other forms of dyspnea: Secondary | ICD-10-CM | POA: Diagnosis not present

## 2023-11-16 DIAGNOSIS — Z87891 Personal history of nicotine dependence: Secondary | ICD-10-CM

## 2023-11-16 NOTE — Telephone Encounter (Signed)
 I spoke with patient.  She has seen pulmonary and had testing done.  Reports atypical cells were found on bronchoscopy but no malignancy.  She continues to have shortness of breath and would like to see Dr Jacinto Halim to discuss cath.  Appointment made for patient to see Dr Jacinto Halim on April 22

## 2023-11-16 NOTE — Telephone Encounter (Signed)
 Pt is requesting callback regarding her being seen by pulmonary and found out the issue is not her lungs so now she'd like to discuss the cath that Dr. Jacinto Halim mentioned to her. Please advise.
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
## 2023-11-16 NOTE — Patient Instructions (Addendum)
 It is good to see you today. Your biopsies were negative for malignancy, positive for atypical cells. We will do a follow up CT Chest in 3 months as surveillance of this nodule.  This will be due early July 2025. You will get a call to get this scheduled closer to the time. You will follow up with me after the scan to review results. You will get a call to schedule the follow up.  Follow up with Dr. Anselm Jungling as he has asked you to do.  Call if you need any additional inhalers.   Please contact office for sooner follow up if symptoms do not improve or worsen or seek emergency care

## 2023-11-17 ENCOUNTER — Telehealth: Payer: Self-pay

## 2023-11-17 NOTE — Telephone Encounter (Signed)
 Spoke with patient. CT scheduled for 02/17/2024 at 9 am [facility]. F/U appt with Maralyn Sago 02/24/2024.

## 2023-11-17 NOTE — Telephone Encounter (Signed)
 Returned call. LVM and request call back to schedule 6 month f/u scan for July.   Copied from CRM [id number]. Topic: Appointments - Scheduling Inquiry for Clinic >> Nov 16, 2023  3:24 PM Dorothy Rojas wrote: Reason for CRM: Patient is requesting a call back from the nurse of Dorothy Rojas as she would like to schedule her CT.

## 2023-11-18 ENCOUNTER — Institutional Professional Consult (permissible substitution): Payer: Medicare Other | Admitting: Internal Medicine

## 2023-11-19 ENCOUNTER — Other Ambulatory Visit: Payer: Self-pay | Admitting: Family Medicine

## 2023-11-19 DIAGNOSIS — E039 Hypothyroidism, unspecified: Secondary | ICD-10-CM

## 2023-12-01 ENCOUNTER — Encounter: Payer: Self-pay | Admitting: Cardiology

## 2023-12-01 ENCOUNTER — Ambulatory Visit: Attending: Cardiology | Admitting: Cardiology

## 2023-12-01 VITALS — BP 122/62 | HR 80 | Resp 18 | Ht 64.0 in | Wt 134.2 lb

## 2023-12-01 DIAGNOSIS — I10 Essential (primary) hypertension: Secondary | ICD-10-CM | POA: Insufficient documentation

## 2023-12-01 DIAGNOSIS — R9431 Abnormal electrocardiogram [ECG] [EKG]: Secondary | ICD-10-CM | POA: Diagnosis not present

## 2023-12-01 DIAGNOSIS — R0609 Other forms of dyspnea: Secondary | ICD-10-CM | POA: Insufficient documentation

## 2023-12-01 NOTE — Progress Notes (Signed)
 Cardiology Office Note:  .   Date:  12/01/2023  ID:  Dorothy Rojas, DOB [birth year], MRN [id number] PCP: Roosvelt Colla, MD  [facility] Providers Cardiologist:  Knox Perl, MD   History of Present Illness: Aaron Aas   Dorothy Rojas is a 78 y.o. Caucasian female with chronic dyspnea on exertion, prior history of tobacco use disorder approximately 15 to 20 pack year history quit in 1986, hypertension, hyperlipidemia, hyperglycemia, history of DVT and factor V Leyden positive in 2014, presenting with worsening dyspnea and outpatient evaluation had revealed elevated D-dimer and CT angiogram chest on 08/27/2023 did not reveal evidence of PE but new nodule recommended follow-up.  She was also found to have coronary and aortic atherosclerosis during CT scan.  I had last seen her on 09/14/2023 and underwent exercise nuclear stress test revealing no evidence of ischemia by perfusion images and EF of 78% with nonspecific ST depressions on 09/21/2023.    She underwent pulmonary evaluation and found to have mild restrictive lung disease and lung nodule biopsy revealed nonmalignant state. She now presents for follow-up to discuss possible cardiac catheterization in view of marked dyspnea on exertion.    Discussed the use of AI scribe software for clinical note transcription with the patient, who gave verbal consent to proceed.  History of Present Illness The patient, with a history of lung disease and recent bronchoscopy with biopsy, presents with persistent shortness of breath. The patient was given inhalers for symptom management, but reports no improvement. The patient also has a history of plaque build-up in the heart, as evidenced by a CT scan showing aortic atherosclerosis and calcification of the coronary arteries. However, a stress test was normal, suggesting adequate cardiac function despite the plaque build-up. The patient also has a mild restrictive lung disease, which could be contributing to the  shortness of breath.  Labs   Lab Results  Component Value Date   CHOL 140 10/19/2023   HDL 53 10/19/2023   LDLCALC 62 10/19/2023   TRIG 144 10/19/2023   CHOLHDL 2.6 10/19/2023   Lab Results  Component Value Date   NA 142 11/09/2023   K 3.4 (L) 11/09/2023   CO2 25 11/09/2023   GLUCOSE 112 (H) 11/09/2023   BUN 20 11/09/2023   CREATININE 0.90 11/09/2023   CALCIUM  9.5 11/09/2023   GFR 66.36 10/14/2019   EGFR 60 08/27/2023   GFRNONAA >60 11/09/2023      Latest Ref Rng & Units 11/09/2023    9:26 AM 10/19/2023    8:22 AM 08/27/2023    5:22 PM  BMP  Glucose 70 - 99 mg/dL 629  94  528   BUN 8 - 23 mg/dL 20   27   Creatinine 4.13 - 1.00 mg/dL 2.44   0.10   Sodium 272 - 145 mmol/L 142   140   Potassium 3.5 - 5.1 mmol/L 3.4   3.3   Chloride 98 - 111 mmol/L 106   100   CO2 22 - 32 mmol/L 25   30   Calcium  8.9 - 10.3 mg/dL 9.5   53.6       Latest Ref Rng & Units 11/09/2023    9:26 AM 08/27/2023    5:22 PM 08/27/2023   11:31 AM  CBC  WBC 4.0 - 10.5 K/uL 8.7  10.4  9.6   Hemoglobin 12.0 - 15.0 g/dL 64.4  03.4  74.2   Hematocrit 36.0 - 46.0 % 39.6  40.4  41.1   Platelets 150 -  400 K/uL 228  234  241    Lab Results  Component Value Date   HGBA1C 6.1 (H) 10/19/2023    Lab Results  Component Value Date   TSH 2.320 08/27/2023    Review of Systems  Cardiovascular:  Positive for dyspnea on exertion. Negative for chest pain and leg swelling.   Physical Exam:   VS:  BP 122/62 (BP Location: Left Arm, Patient Position: Sitting, Cuff Size: Normal)   Pulse 80   Resp 18   Ht 5\' 4"  (1.626 m)   Wt 134 lb 3.2 oz (60.9 kg)   LMP  (LMP Unknown)   SpO2 97%   BMI 23.04 kg/m    Wt Readings from Last 3 Encounters:  12/01/23 134 lb 3.2 oz (60.9 kg)  11/16/23 131 lb 12.8 oz (59.8 kg)  11/09/23 130 lb (59 kg)    Physical Exam Neck:     Vascular: No carotid bruit or JVD.  Cardiovascular:     Rate and Rhythm: Normal rate and regular rhythm.     Pulses: Intact distal pulses.     Heart  sounds: Normal heart sounds. No murmur heard.    No gallop.  Pulmonary:     Effort: Pulmonary effort is normal.     Breath sounds: Normal breath sounds.  Abdominal:     General: Bowel sounds are normal.     Palpations: Abdomen is soft.  Musculoskeletal:     Right lower leg: No edema.     Left lower leg: No edema.    Studies Reviewed: .    MYOCARDIAL PERFUSION IMAGING 09/21/2023   Narrative   The study is normal. The study is low risk.   Fair exercice capacity, achieved 7.0 METS   Peak heart rate 127 bpm (88% max age predicted HR)   horizontal ST depression in the inferior leads (II, III, aVF, V4, V5 and V6) was noted.  Baseline ST depressions worsen during stress   LV perfusion is normal. There is no evidence of ischemia. There is no evidence of infarction.   Left ventricular function is normal. Nuclear stress EF: 78%. The left ventricular ejection fraction is hyperdynamic (>65%). End diastolic cavity size is normal. End systolic cavity size is normal.  EKG:    EKG Interpretation Date/Time:  Tuesday December 01 2023 14:20:50 EDT Ventricular Rate:  83 PR Interval:  142 QRS Duration:  80 QT Interval:  364 QTC Calculation: 427 R Axis:   35  Text Interpretation: EKG 12/01/2023: Normal sinus rhythm with rate of 83 bpm, nonspecific ST abnormality in the form of minimal ST depression in inferolateral leads, no change from 09/14/2023 and ST depression less prominent compared to 08/27/2023. Confirmed by Petrice Beedy, Jagadeesh ([id number]) on 12/01/2023 2:44:54 PM    Medications and allergies    Allergies  Allergen Reactions   Naprosyn [Naproxen] Rash     Current Outpatient Medications:    acetaminophen  (TYLENOL ) 500 MG tablet, Take 1,000 mg by mouth daily as needed for mild pain (pain score 1-3) or moderate pain (pain score 4-6)., Disp: , Rfl:    albuterol  (VENTOLIN  HFA) 108 (90 Base) MCG/ACT inhaler, Inhale 2 puffs into the lungs every 4 (four) hours as needed for wheezing or shortness of breath.  (Patient taking differently: Inhale 2 puffs into the lungs daily as needed for wheezing or shortness of breath.), Disp: 8 g, Rfl: 6   amLODipine  (NORVASC ) 2.5 MG tablet, Take 1 tablet (2.5 mg total) by mouth every evening., Disp: 90 tablet, Rfl: 3  aspirin  EC 81 MG tablet, Take 81 mg by mouth at bedtime. Swallow whole., Disp: , Rfl:    atorvastatin  (LIPITOR) 40 MG tablet, Take 1 tablet (40 mg total) by mouth daily. (Patient taking differently: Take 40 mg by mouth at bedtime.), Disp: 90 tablet, Rfl: 1   Calcium  Carbonate-Vit D-Min (CALCIUM  600+D PLUS MINERALS) 600-400 MG-UNIT TABS, Take 1 capsule by mouth daily., Disp: , Rfl:    Cholecalciferol  (VITAMIN D ) 50 MCG (2000 UT) tablet, Take 2,000 Units by mouth daily., Disp: , Rfl:    denosumab  (PROLIA ) 60 MG/ML SOSY injection, Inject 60 mg into the skin every 6 (six) months., Disp: , Rfl:    losartan -hydrochlorothiazide  (HYZAAR) 100-12.5 MG tablet, TAKE ONE TABLET BY MOUTH ONCE DAILY. keep appointment FOR additional refills., Disp: 90 tablet, Rfl: 3   Multiple Vitamins-Minerals (MULTIVITAMIN WITH MINERALS) tablet, Take 1 tablet by mouth daily., Disp: , Rfl:    omeprazole (PRILOSEC) 40 MG capsule, Take 40 mg by mouth daily as needed (reflux)., Disp: , Rfl:    SYNTHROID  50 MCG tablet, TAKE 1 EVERY MORNING ON AN EMPTY STOMACH PRIOR TO ANY OTHER MEDICATIONS FOR HYPOTHYROIDISM, Disp: 90 tablet, Rfl: 1   vitamin C (ASCORBIC ACID ) 500 MG tablet, Take 500 mg by mouth daily., Disp: , Rfl:    No orders of the defined types were placed in this encounter.    There are no discontinued medications.   ASSESSMENT AND PLAN: .      ICD-10-CM   1. Dyspnea on exertion  R06.09 EKG 12-Lead    ECHOCARDIOGRAM COMPLETE    2. Nonspecific abnormal electrocardiogram (ECG) (EKG)  R94.31     3. Essential hypertension  I10      Assessment & Plan Shortness of breath   Persistent dyspnea continues despite ineffective inhalers. Heart failure is unlikely given the normal  physical exam, stress test, and absence of irregular heart sounds, neck pain, leg swelling, or hepatomegaly. Pulmonary hypertension or amyloidosis are potential causes. An echocardiogram is preferred over heart catheterization for its non-invasive nature and ability to rule out pulmonary hypertension and evaluate diastology. Order an echocardiogram to evaluate for these conditions.  Mild abnormality in the EKG is probably nonspecific.  Aortic atherosclerosis   A CT scan revealed aortic atherosclerosis and coronary artery calcification. Normal stress test results done recently indicate adequate myocardial perfusion. Further invasive testing is not expected to yield significant findings.  She is presently on 40 mg of atorvastatin  with excellent control of lipids.  Mild restrictive lung disease   Pulmonary function tests indicate mild restrictive lung disease, likely due to age-related changes such as reduced rib cage expansion from cartilage ossification. This is a normal aging process and not life-threatening.  I encouraged her to increase her physical activity.  Unless I see significant abnormalities on the echocardiogram, I will see her back on the as needed basis.  Signed,  Knox Perl, MD, [facility] 12/01/2023, 2:45 PM [facility] [address] [place], [place] [address] Phone: [phone]. Fax:  [phone]

## 2023-12-01 NOTE — Patient Instructions (Signed)
 Medication Instructions:  Your physician recommends that you continue on your current medications as directed. Please refer to the Current Medication list given to you today.  *If you need a refill on your cardiac medications before your next appointment, please call your pharmacy*  Lab Work: none If you have labs (blood work) drawn today and your tests are completely normal, you will receive your results only by: MyChart Message (if you have MyChart) OR A paper copy in the mail If you have any lab test that is abnormal or we need to change your treatment, we will call you to review the results.  Testing/Procedures: Your physician has requested that you have an echocardiogram. Echocardiography is a painless test that uses sound waves to create images of your heart. It provides your doctor with information about the size and shape of your heart and how well your heart's chambers and valves are working. This procedure takes approximately one hour. There are no restrictions for this procedure. Please do NOT wear cologne, perfume, aftershave, or lotions (deodorant is allowed). Please arrive 15 minutes prior to your appointment time.  Please note: We ask at that you not bring children with you during ultrasound (echo/ vascular) testing. Due to room size and safety concerns, children are not allowed in the ultrasound rooms during exams. Our front office staff cannot provide observation of children in our lobby area while testing is being conducted. An adult accompanying a patient to their appointment will only be allowed in the ultrasound room at the discretion of the ultrasound technician under special circumstances. We apologize for any inconvenience.   Follow-Up: At [facility], you and your health needs are our priority.  As part of our continuing mission to provide you with exceptional heart care, our providers are all part of one team.  This team includes your primary Cardiologist  (physician) and Advanced Practice Providers or APPs (Physician Assistants and Nurse Practitioners) who all work together to provide you with the care you need, when you need it.  Your next appointment:   As needed  Provider:   Knox Perl, MD     We recommend signing up for the patient portal called "MyChart".  Sign up information is provided on this After Visit Summary.  MyChart is used to connect with patients for Virtual Visits (Telemedicine).  Patients are able to view lab/test results, encounter notes, upcoming appointments, etc.  Non-urgent messages can be sent to your provider as well.   To learn more about what you can do with MyChart, go to [email or web address].   Other Instructions        1st Floor: - Lobby - Registration  - Pharmacy  - Lab - Cafe  2nd Floor: - PV Lab - Diagnostic Testing (echo, CT, nuclear med)  3rd Floor: - Vacant  4th Floor: - TCTS (cardiothoracic surgery) - AFib Clinic - Structural Heart Clinic - Vascular Surgery  - Vascular Ultrasound  5th Floor: - [facility] (general and EP) - Clinical Pharmacy for coumadin, hypertension, lipid, weight-loss medications, and med management appointments    Valet parking services will be available as well.

## 2023-12-11 ENCOUNTER — Telehealth: Payer: Self-pay

## 2023-12-11 ENCOUNTER — Other Ambulatory Visit: Payer: Self-pay | Admitting: Internal Medicine

## 2023-12-11 NOTE — Telephone Encounter (Signed)
 Dr. Monnie Anthony, patient will be scheduled as soon as possible.  Auth Submission: NO AUTH NEEDED Site of care: Site of care: [facility] Payer: Medicare A/B with BCBS supplement  Medication & CPT/J Code(s) submitted: Prolia  (Denosumab ) [id number] Route of submission (phone, fax, portal):  Phone # Fax # Auth type: Buy/Bill PB Units/visits requested: 60mg  x 2 doses Reference number:  Approval from: 12/11/23 to 09/10/24

## 2023-12-11 NOTE — Progress Notes (Signed)
 Ordered prolia  and sent to [facility] going forward

## 2023-12-14 ENCOUNTER — Encounter: Payer: Self-pay | Admitting: Family Medicine

## 2023-12-14 ENCOUNTER — Ambulatory Visit (INDEPENDENT_AMBULATORY_CARE_PROVIDER_SITE_OTHER): Admitting: Family Medicine

## 2023-12-14 VITALS — BP 118/74 | HR 80 | Temp 99.4°F | Ht 64.0 in | Wt 134.8 lb

## 2023-12-14 DIAGNOSIS — R300 Dysuria: Secondary | ICD-10-CM

## 2023-12-14 DIAGNOSIS — R350 Frequency of micturition: Secondary | ICD-10-CM | POA: Diagnosis not present

## 2023-12-14 DIAGNOSIS — N3000 Acute cystitis without hematuria: Secondary | ICD-10-CM

## 2023-12-14 LAB — POCT URINALYSIS DIP (PROADVANTAGE DEVICE)
Bilirubin, UA: NEGATIVE
Blood, UA: NEGATIVE
Glucose, UA: NEGATIVE mg/dL
Nitrite, UA: NEGATIVE
Protein Ur, POC: 30 mg/dL — AB
Specific Gravity, Urine: 1.01
Urobilinogen, Ur: 0.2
pH, UA: 7.5 (ref 5.0–8.0)

## 2023-12-14 MED ORDER — NITROFURANTOIN MONOHYD MACRO 100 MG PO CAPS: 100.0000 mg | ORAL_CAPSULE | Freq: Two times a day (BID) | ORAL | 0 refills | Status: DC

## 2023-12-14 NOTE — Patient Instructions (Signed)
 Stay well hydrated. Avoid prolonged holding of your urine. Take the antibiotic twice daily. If the urine doesn't show any infection, you should stop it.  Contact the office if you develop fever, chills, vomiting, flank pain, abdominal pain or blood in the urine.

## 2023-12-14 NOTE — Progress Notes (Signed)
 Chief Complaint  Patient presents with   Dysuria    Burning with urination and frequency that began 2 days ago.   Symptoms started 3 days ago with dysuria.  It is just slightly worse from when she first noticed this. She denies urgency, but does have some increased frequency. Denies odor to the urine, reports it is dark yellow. Feels similar to UTI's in the past.     PMH, PSH, SH reviewed  Outpatient Encounter Medications as of 12/14/2023  Medication Sig Note   amLODipine  (NORVASC ) 2.5 MG tablet Take 1 tablet (2.5 mg total) by mouth every evening.    aspirin  EC 81 MG tablet Take 81 mg by mouth at bedtime. Swallow whole.    atorvastatin  (LIPITOR) 40 MG tablet Take 1 tablet (40 mg total) by mouth daily. (Patient taking differently: Take 40 mg by mouth at bedtime.)    Calcium  Carbonate-Vit D-Min (CALCIUM  600+D PLUS MINERALS) 600-400 MG-UNIT TABS Take 1 capsule by mouth daily.    Cholecalciferol  (VITAMIN D ) 50 MCG (2000 UT) tablet Take 2,000 Units by mouth daily.    denosumab  (PROLIA ) 60 MG/ML SOSY injection Inject 60 mg into the skin every 6 (six) months.    losartan -hydrochlorothiazide  (HYZAAR) 100-12.5 MG tablet TAKE ONE TABLET BY MOUTH ONCE DAILY. keep appointment FOR additional refills.    Multiple Vitamins-Minerals (MULTIVITAMIN WITH MINERALS) tablet Take 1 tablet by mouth daily.    omeprazole (PRILOSEC) 40 MG capsule Take 40 mg by mouth daily as needed (reflux).    SYNTHROID  50 MCG tablet TAKE 1 EVERY MORNING ON AN EMPTY STOMACH PRIOR TO ANY OTHER MEDICATIONS FOR HYPOTHYROIDISM    vitamin C (ASCORBIC ACID ) 500 MG tablet Take 500 mg by mouth daily.    acetaminophen  (TYLENOL ) 500 MG tablet Take 1,000 mg by mouth daily as needed for mild pain (pain score 1-3) or moderate pain (pain score 4-6). (Patient not taking: Reported on 12/14/2023) 12/14/2023: As needed   albuterol  (VENTOLIN  HFA) 108 (90 Base) MCG/ACT inhaler Inhale 2 puffs into the lungs every 4 (four) hours as needed for wheezing or  shortness of breath. (Patient not taking: Reported on 12/14/2023) 12/14/2023: As needed   No facility-administered encounter medications on file as of 12/14/2023.   Allergies  Allergen Reactions   Naprosyn [Naproxen] Rash    ROS:  no fever or chills. Denies abdominal pain, flank pain, fever or chills. No vaginal discharge. No blood in the urine. No CP, SOB, bleeding, bruising or other concerns.    PHYSICAL EXAM:  BP 118/74   Pulse 80   Temp 99.4 F (37.4 C) (Tympanic)   Ht 5\' 4"  (1.626 m)   Wt 134 lb 12.8 oz (61.1 kg)   LMP  (LMP Unknown)   BMI 23.14 kg/m   Pleasant, well-appearing female in no distress Neck: no lymphadenopathy Heart: regular rate and rhythm Lungs: clear Back: no spinal or CVA tenderness Abdomen: soft, nontender Neuro: alert and oriented, cranial nerves grossly intact, normal gait Psych: normal mood, affect   Urine: cloudy SG 1.010, small ketones, 2+ leuks, negative nitrite, +protein, negative blood.   ASSESSMENT/PLAN:  Acute cystitis without hematuria - Plan: Urine Culture, nitrofurantoin , macrocrystal-monohydrate, (MACROBID ) 100 MG capsule  Burning with urination - Plan: POCT Urinalysis DIP (Proadvantage Device)  Urinary frequency - Plan: POCT Urinalysis DIP (Proadvantage Device)   Advised to stay well hydrated. Start presumptive ABX while waiting for culture. If culture is negative for infection, will be instructed to stop ABX. Discussed possible alternative etiologies (atrophic/postmenopausal changes), and to  f/u if sx persist or worsen.

## 2023-12-16 ENCOUNTER — Encounter: Payer: Self-pay | Admitting: Family Medicine

## 2023-12-16 LAB — URINE CULTURE

## 2023-12-30 ENCOUNTER — Ambulatory Visit: Payer: Self-pay | Admitting: Cardiology

## 2023-12-30 ENCOUNTER — Ambulatory Visit (HOSPITAL_COMMUNITY)
Admission: RE | Admit: 2023-12-30 | Discharge: 2023-12-30 | Disposition: A | Discharge status: Home / Self Care | Source: Ambulatory Visit | Attending: Cardiology | Admitting: Cardiology

## 2023-12-30 DIAGNOSIS — R0609 Other forms of dyspnea: Secondary | ICD-10-CM | POA: Diagnosis not present

## 2023-12-30 LAB — ECHOCARDIOGRAM COMPLETE
AR max vel: 1.6 cm2
AV Area VTI: 1.64 cm2
AV Area mean vel: 1.54 cm2
AV Mean grad: 6 mmHg
AV Peak grad: 11.4 mmHg
Ao pk vel: 1.69 m/s
Area-P 1/2: 3.21 cm2
S' Lateral: 2.05 cm

## 2023-12-30 NOTE — Progress Notes (Signed)
   You have completely normal echo with mild to moderate tricuspid regurgitation.  And overall stable from 2017 and 2021 echo.   TR means tricuspid regurgitation (leakage on the right heart valve). Similar to MR= Mitral regurgitation. (leakage on the left heart valve). Mild MR or TR are probably normal variants unless physicians feel it is abnormal. Reassure.

## 2023-12-31 NOTE — Telephone Encounter (Signed)
Patient viewed result comments in my chart 

## 2024-01-06 ENCOUNTER — Encounter: Payer: Self-pay | Admitting: Family Medicine

## 2024-01-20 ENCOUNTER — Telehealth: Payer: Self-pay | Admitting: Cardiology

## 2024-01-20 ENCOUNTER — Other Ambulatory Visit: Payer: Self-pay | Admitting: Cardiology

## 2024-01-20 DIAGNOSIS — I10 Essential (primary) hypertension: Secondary | ICD-10-CM

## 2024-01-20 MED ORDER — AMLODIPINE BESYLATE 2.5 MG PO TABS: 2.5000 mg | ORAL_TABLET | Freq: Every evening | ORAL | 2 refills | Status: DC

## 2024-01-20 NOTE — Telephone Encounter (Signed)
*  STAT* If patient is at the pharmacy, call can be transferred to refill team.   1. Which medications need to be refilled? (please list name of each medication and dose if known)   amLODipine  (NORVASC ) 2.5 MG tablet (Expired)   2. Would you like to learn more about the convenience, safety, & potential cost savings by using the [facility]?   3. Are you open to using the [facility] (Type [facility]. ).  4. Which pharmacy/location (including street and city if local pharmacy) is medication to be sent to?  [facility] [place], [place] - [address]   5. Do they need a 30 day or 90 day supply?   90 day  Patient stated she has 4 tablets left.

## 2024-01-20 NOTE — Telephone Encounter (Signed)
 Pt's medication was sent to pt's pharmacy as requested. Confirmation received.

## 2024-01-29 ENCOUNTER — Telehealth: Payer: Self-pay

## 2024-01-29 NOTE — Telephone Encounter (Signed)
 Returned VM. Pt was scheduled in April for a f/u CT on 02/17/2024. Additional appt made by radiology on 02/16/2024. Pt requesting to cancel one appt due to double booking. LVM requesting patient advise which appt she would like to keep. If patient keeps 7/9 appt, appt should be changed from LCS to Chest CT WO in visit type.

## 2024-02-04 ENCOUNTER — Encounter: Payer: Self-pay | Admitting: Family Medicine

## 2024-02-09 ENCOUNTER — Ambulatory Visit: Admitting: Acute Care

## 2024-02-10 DIAGNOSIS — H43811 Vitreous degeneration, right eye: Secondary | ICD-10-CM | POA: Diagnosis not present

## 2024-02-10 DIAGNOSIS — Z961 Presence of intraocular lens: Secondary | ICD-10-CM | POA: Diagnosis not present

## 2024-02-16 ENCOUNTER — Ambulatory Visit (HOSPITAL_BASED_OUTPATIENT_CLINIC_OR_DEPARTMENT_OTHER)
Admission: RE | Admit: 2024-02-16 | Discharge: 2024-02-16 | Disposition: A | Discharge status: Home / Self Care | Source: Ambulatory Visit | Attending: Acute Care | Admitting: Acute Care

## 2024-02-16 DIAGNOSIS — R918 Other nonspecific abnormal finding of lung field: Secondary | ICD-10-CM | POA: Diagnosis not present

## 2024-02-16 DIAGNOSIS — J984 Other disorders of lung: Secondary | ICD-10-CM | POA: Diagnosis not present

## 2024-02-16 DIAGNOSIS — R911 Solitary pulmonary nodule: Secondary | ICD-10-CM | POA: Insufficient documentation

## 2024-02-17 ENCOUNTER — Ambulatory Visit (HOSPITAL_BASED_OUTPATIENT_CLINIC_OR_DEPARTMENT_OTHER)

## 2024-02-24 ENCOUNTER — Telehealth: Payer: Self-pay | Admitting: Acute Care

## 2024-02-24 ENCOUNTER — Encounter: Payer: Self-pay | Admitting: Acute Care

## 2024-02-24 ENCOUNTER — Ambulatory Visit (INDEPENDENT_AMBULATORY_CARE_PROVIDER_SITE_OTHER): Admitting: Acute Care

## 2024-02-24 VITALS — BP 134/60 | HR 73 | Ht 64.0 in | Wt 131.8 lb

## 2024-02-24 DIAGNOSIS — R911 Solitary pulmonary nodule: Secondary | ICD-10-CM | POA: Insufficient documentation

## 2024-02-24 DIAGNOSIS — Z801 Family history of malignant neoplasm of trachea, bronchus and lung: Secondary | ICD-10-CM | POA: Diagnosis not present

## 2024-02-24 DIAGNOSIS — Z87891 Personal history of nicotine dependence: Secondary | ICD-10-CM | POA: Diagnosis not present

## 2024-02-24 NOTE — Telephone Encounter (Signed)
 Sarah cancelled this patient wanted to be referred to thoracic

## 2024-02-24 NOTE — Patient Instructions (Signed)
 It is good to see you today. We have reviewed your Ct chest, and the nodule we have been following has increased in size and density over the last 3 months. As the previous biopsy showed atypical cells, and you want the nodule removed, you prefer referral to thoracic surgery vs repeat biopsy. I have referred you to thoracic surgery . You will get a call to schedule your consultation with one of the surgeons. Call us  if you need us . Metta Sours with surgery. Please contact office for sooner follow up if symptoms do not improve or worsen or seek emergency care
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
## 2024-02-24 NOTE — Telephone Encounter (Signed)
 Please schedule the following:  Provider performing procedure: Byrum Diagnosis: Lung nodule Which side if for nodule / mass? Right  Procedure:  Navigational bronchoscopy with biopsies  Has patient been spoken to by Provider and given informed consent?  Yes, to Lauraine Lites NP on 02/24/2024 Anesthesia:  General Do you need Fluro?  Yes Duration of procedure:  1.5 Date: 03/01/2024 Alternate Date: 7/28 or 7/29  Time: AM is the only one left Location: [facility] Does patient have OSA?  No DM? No Or Latex allergy? No Medication Restriction/ Anticoagulate/Antiplatelet:  Aspirin  will need to be held x 48 hours before procedure Pre-op Labs Ordered:determined by Anesthesia Imaging request: Recent CT chest 02/16/2024 (If, SuperDimension CT Chest, please have STAT courier sent to ENDO)

## 2024-02-26 ENCOUNTER — Encounter: Payer: Self-pay | Admitting: Family Medicine

## 2024-03-01 ENCOUNTER — Encounter (HOSPITAL_COMMUNITY): Payer: Self-pay

## 2024-03-01 ENCOUNTER — Ambulatory Visit (HOSPITAL_COMMUNITY): Admit: 2024-03-01 | Admitting: Emergency Medicine

## 2024-03-01 SURGERY — VIDEO BRONCHOSCOPY WITH ENDOBRONCHIAL NAVIGATION
Anesthesia: General | Laterality: Right

## 2024-03-04 ENCOUNTER — Encounter: Payer: Self-pay | Admitting: Nurse Practitioner

## 2024-03-04 ENCOUNTER — Ambulatory Visit: Admitting: Nurse Practitioner

## 2024-03-04 ENCOUNTER — Encounter: Payer: Self-pay | Admitting: Family Medicine

## 2024-03-04 VITALS — BP 148/82 | HR 85 | Wt 131.8 lb

## 2024-03-04 DIAGNOSIS — M5481 Occipital neuralgia: Secondary | ICD-10-CM | POA: Diagnosis not present

## 2024-03-04 MED ORDER — BACLOFEN 5 MG PO TABS: ORAL_TABLET | ORAL | 1 refills | Status: DC

## 2024-03-04 MED ORDER — NURTEC 75 MG PO TBDP: ORAL_TABLET | ORAL | Status: DC

## 2024-03-04 NOTE — Assessment & Plan Note (Signed)
 Intermittent left-sided scalp pain, described as a deep ache lasting 15 minutes to several hours, exacerbated by neck movement. No tenderness, nausea, photophobia, phonophobia, or visual changes. Likely due to muscle tension and spasm due to posture during activities such as reading in a recliner or computer use. Differential diagnosis included tension-type headache, migraine, and cluster headache, but symptoms align with occipital neuralgia. Condition is benign and expected to resolve with treatment. Baclofen chosen for muscle relaxation due to insurance coverage and cost considerations. - Prescribe baclofen 10 mg, one tablet orally at bedtime for 4 days. - Provide Nurtec samples for acute episodes not controlled with Tylenol , with instructions to take no more than one every 24 hours. - Advise on neck stretches to alleviate muscle tension. - Instruct to maintain proper posture while reading or using electronic devices. - Encourage use of acetaminophen  as needed for pain relief.

## 2024-03-04 NOTE — Progress Notes (Signed)
 Camie FORBES Doing, DNP, AGNP-c [facility] [address] [place], [place] [address] [phone]   ACUTE VISIT- ESTABLISHED PATIENT  Blood pressure (!) 148/82, pulse 85, weight 131 lb 12.8 oz (59.8 kg).  Subjective:  HPI History of Present Illness Dorothy Rojas is a 78 year old female who presents with left-sided scalp pain.  She has been experiencing left-sided scalp pain for the past week and a half. The pain is intermittent, lasting between 15 minutes to 2 hours, and sometimes radiates to the left corner of her eye.   She recalls a fall several months ago while playing pickleball, where she fell on her left side but did not hit her head.  She has not had any recent changes in medication and has not taken any medication for the current pain. She engages in regular exercise and reads often, which involves sitting in a recliner with her head down.  No tenderness to touch, sharp or shooting pain, nausea, sensitivity to light or sound, vision changes, or weakness in her arms or legs. There is no watering of the eye during these episodes. No ear or facial pain outside of the temple.No neck pain or tension, and the pain is consistently on the left side. No fevers or chills.  She engages in regular exercise and reads often, which does involve sitting in a recliner with her head down.  ROS negative except for what is listed in HPI. History, Medications, Surgery, SDOH, and Family History reviewed and updated as appropriate.  Objective:  Physical Exam Vitals and nursing note reviewed.  Constitutional:      General: She is not in acute distress.    Appearance: Normal appearance. She is normal weight. She is not ill-appearing or diaphoretic.  HENT:     Head: Normocephalic and atraumatic.     Right Ear: Tympanic membrane normal.     Left Ear: Tympanic membrane normal.     Mouth/Throat:     Mouth: Mucous membranes are moist.     Pharynx: Oropharynx is clear.   Eyes:     General: Lids are normal. Lids are everted, no foreign bodies appreciated. Vision grossly intact. No visual field deficit.    Extraocular Movements: Extraocular movements intact.     Right eye: Normal extraocular motion and no nystagmus.     Left eye: Normal extraocular motion and no nystagmus.     Conjunctiva/sclera: Conjunctivae normal.     Pupils: Pupils are equal, round, and reactive to light.  Neck:     Vascular: No carotid bruit.  Cardiovascular:     Rate and Rhythm: Normal rate and regular rhythm.  Pulmonary:     Effort: Pulmonary effort is normal.  Lymphadenopathy:     Cervical: No cervical adenopathy.  Skin:    General: Skin is warm and dry.  Neurological:     General: No focal deficit present.     Mental Status: She is alert and oriented to person, place, and time.     Cranial Nerves: No cranial nerve deficit.     Sensory: No sensory deficit.     Motor: No weakness.     Coordination: Coordination normal.     Gait: Gait normal.  Psychiatric:        Mood and Affect: Mood normal.        Behavior: Behavior normal.         Assessment & Plan:   Problem List Items Addressed This Visit     Occipital neuralgia of left  side - Primary   Intermittent left-sided scalp pain, described as a deep ache lasting 15 minutes to several hours, exacerbated by neck movement. No tenderness, nausea, photophobia, phonophobia, or visual changes. Likely due to muscle tension and spasm due to posture during activities such as reading in a recliner or computer use. Differential diagnosis included tension-type headache, migraine, and cluster headache, but symptoms align with occipital neuralgia. Condition is benign and expected to resolve with treatment. Baclofen chosen for muscle relaxation due to insurance coverage and cost considerations. - Prescribe baclofen 10 mg, one tablet orally at bedtime for 4 days. - Provide Nurtec samples for acute episodes not controlled with Tylenol , with  instructions to take no more than one every 24 hours. - Advise on neck stretches to alleviate muscle tension. - Instruct to maintain proper posture while reading or using electronic devices. - Encourage use of acetaminophen  as needed for pain relief.      Relevant Medications   Baclofen 5 MG TABS   Rimegepant Sulfate (NURTEC) 75 MG TBDP      Camie FORBES Doing, DNP, AGNP-c

## 2024-03-04 NOTE — Patient Instructions (Addendum)
 Neck Exercises Ask your health care provider which exercises are safe for you. Do exercises exactly as told by your health care provider and adjust them as directed. It is normal to feel mild stretching, pulling, tightness, or discomfort as you do these exercises. Stop right away if you feel sudden pain or your pain gets worse. Do not begin these exercises until told by your health care provider. Neck exercises can be important for many reasons. They can improve strength and maintain flexibility in your neck, which will help your upper back and prevent neck pain. Stretching exercises Rotation neck stretching  Sit in a chair or stand up. Place your feet flat on the floor, shoulder-width apart. Slowly turn your head (rotate) to the right until a slight stretch is felt. Turn it all the way to the right so you can look over your right shoulder. Do not tilt or tip your head. Hold this position for 10-30 seconds. Slowly turn your head (rotate) to the left until a slight stretch is felt. Turn it all the way to the left so you can look over your left shoulder. Do not tilt or tip your head. Hold this position for 10-30 seconds. Repeat __________ times. Complete this exercise __________ times a day. Neck retraction  Sit in a sturdy chair or stand up. Look straight ahead. Do not bend your neck. Use your fingers to push your chin backward (retraction). Do not bend your neck for this movement. Continue to face straight ahead. If you are doing the exercise properly, you will feel a slight sensation in your throat and a stretch at the back of your neck. Hold the stretch for 1-2 seconds. Repeat __________ times. Complete this exercise __________ times a day. Strengthening exercises Neck press  Lie on your back on a firm bed or on the floor with a pillow under your head. Use your neck muscles to push your head down on the pillow and straighten your spine. Hold the position as well as you can. Keep your head  facing up (in a neutral position) and your chin tucked. Slowly count to 5 while holding this position. Isometrics These are exercises in which you strengthen the muscles in your neck while keeping your neck still (isometrics). Sit in a supportive chair and place your hand on your forehead. Keep your head and face facing straight ahead. Do not flex or extend your neck while doing isometrics. Push forward with your head and neck while pushing back with your hand. Hold for 10 seconds. Do the sequence again, this time putting your hand against the back of your head. Use your head and neck to push backward against the hand pressure. Finally, do the same exercise on either side of your head, pushing sideways against the pressure of your hand. Prone head lifts  Lie face-down (prone position), resting on your elbows so that your chest and upper back are raised. Start with your head facing downward, near your chest. Position your chin either on or near your chest. Slowly lift your head upward. Lift until you are looking straight ahead. Then continue lifting your head as far back as you can comfortably stretch. Hold your head up for 5 seconds. Then slowly lower it to your starting position. Supine head lifts  Lie on your back (supine position), bending your knees to point to the ceiling and keeping your feet flat on the floor. Lift your head slowly off the floor, raising your chin toward your chest. Hold for 5 seconds.  Scapular retraction  Stand with your arms at your sides. Look straight ahead. Slowly pull both shoulders (scapulae) backward and downward (retraction) until you feel a stretch between your shoulder blades in your upper back. Hold for 10-30 seconds. Relax and repeat.  Contact a health care provider if: Your neck pain or discomfort gets worse when you do an exercise. Your neck pain or discomfort does not improve within 2 hours after you exercise. If you have any of these problems,  stop exercising right away. Do not do the exercises again unless your health care provider says that you can. Get help right away if: You develop sudden, severe neck pain. If this happens, stop exercising right away. Do not do the exercises again unless your health care provider says that you can. This information is not intended to replace advice given to you by your health care provider. Make sure you discuss any questions you have with your health care provider. Document Revised: 01/22/2021 Document Reviewed: 01/22/2021 Elsevier Patient Education  2024 [employer].

## 2024-03-07 ENCOUNTER — Ambulatory Visit: Admitting: Family Medicine
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
## 2024-03-11 ENCOUNTER — Encounter: Payer: Self-pay | Admitting: Cardiology

## 2024-03-11 ENCOUNTER — Ambulatory Visit
Attending: Thoracic Surgery (Cardiothoracic Vascular Surgery) | Admitting: Thoracic Surgery (Cardiothoracic Vascular Surgery)

## 2024-03-11 ENCOUNTER — Encounter: Payer: Self-pay | Admitting: *Deleted

## 2024-03-11 ENCOUNTER — Other Ambulatory Visit: Payer: Self-pay | Admitting: *Deleted

## 2024-03-11 ENCOUNTER — Encounter: Payer: Self-pay | Admitting: Thoracic Surgery (Cardiothoracic Vascular Surgery)

## 2024-03-11 VITALS — BP 132/65 | HR 83 | Resp 20 | Ht 64.0 in | Wt 131.9 lb

## 2024-03-11 DIAGNOSIS — R0602 Shortness of breath: Secondary | ICD-10-CM

## 2024-03-11 DIAGNOSIS — R911 Solitary pulmonary nodule: Secondary | ICD-10-CM | POA: Diagnosis not present

## 2024-03-15 ENCOUNTER — Encounter: Payer: Self-pay | Admitting: Family Medicine

## 2024-03-15 ENCOUNTER — Ambulatory Visit: Payer: Self-pay

## 2024-03-15 NOTE — Telephone Encounter (Signed)
 Per E2C2  Nurse offered patient appt for 8/5, 8/6 with another provider within the office - pt stated she will wait until Thursday so she can be seen by her PCP.

## 2024-03-15 NOTE — Telephone Encounter (Signed)
 FYI Only or Action Required?: Action required by provider: request for appointment and request to be worked into PCP schedule earlier than appt scheduled.  Patient was last seen in primary care on 03/04/2024 by Early, Camie BRAVO, NP.  Called Nurse Triage reporting Abdominal Pain.  Symptoms began late last week.  Interventions attempted: Nothing.  Symptoms are: unchanged.  Triage Disposition: See Physician Within 24 Hours  Patient/caregiver understands and will follow disposition?: Yes    Copied from CRM #[id number]. Topic: Clinical - Red Word Triage >> Mar 15, 2024  9:03 AM Berwyn MATSU wrote: Red Word that prompted transfer to Nurse Triage: [id number] Reason for Disposition  [1] MODERATE pain (e.g., interferes with normal activities) AND [2] pain comes and goes (cramps) AND [3] present > 24 hours  (Exception: Pain with Vomiting or Diarrhea - see that Guideline.)  Answer Assessment - Initial Assessment Questions 1. LOCATION: Where does it hurt?      Low abd 2. RADIATION: Does the pain shoot anywhere else? (e.g., chest, back)     no 3. ONSET: When did the pain begin? (e.g., minutes, hours or days ago)      Late last week 4. SUDDEN: Gradual or sudden onset?     Sudden - worse when standing     5. PATTERN Does the pain come and go, or is it constant?     Constant  6. SEVERITY: How bad is the pain?  (e.g., Scale 1-10; mild, moderate, or severe)     10/10 when standing 7. RECURRENT SYMPTOM: Have you ever had this type of stomach pain before? If Yes, ask: When was the last time? and What happened that time?      no 8. CAUSE: What do you think is causing the stomach pain? (e.g., gallstones, recent abdominal surgery)     unknown 9. RELIEVING/AGGRAVATING FACTORS: What makes it better or worse? (e.g., antacids, bending or twisting motion, bowel movement)     Sitting makes it better/more tolerable 10. OTHER SYMPTOMS: Do you have any other symptoms? (e.g., back pain,  diarrhea, fever, urination pain, vomiting)       Low grade temp 11. PREGNANCY: Is there any chance you are pregnant? When was your last menstrual period?       Na  Pt stated when standing Abd pain goes from abd all the way around to low back.  Pain continues with each 2 to 3 three steps:pt states she has to slow down/stop at times to work through pain.  Nurse asked pt to apply pressure to the area with pain and asked patient if pain radiated anywhere and pt stated pain stayed at that location in her abd area.  Nurse offered patient appt for 8/5, 8/6 with another provider within the office - pt stated she will wait until Thursday so she can be seen by her PCP.  Protocols used: Abdominal Pain - Female-A-AH

## 2024-03-16 ENCOUNTER — Ambulatory Visit
Admission: RE | Admit: 2024-03-16 | Discharge: 2024-03-16 | Disposition: A | Discharge status: Home / Self Care | Source: Ambulatory Visit | Attending: Family Medicine | Admitting: Family Medicine

## 2024-03-16 ENCOUNTER — Ambulatory Visit: Payer: Self-pay

## 2024-03-16 DIAGNOSIS — Z1231 Encounter for screening mammogram for malignant neoplasm of breast: Secondary | ICD-10-CM | POA: Diagnosis not present

## 2024-03-16 NOTE — Progress Notes (Unsigned)
 No chief complaint on file.  Lower abdominal pain since late last week. Worse with standing. Pain was described to triage nurse yesterday as 10/10 with standing, constant. Pain wraps around to the low back. She had reported a low grade fever earlier in the week.   ***UPDATE  She had symptoms of UTI in 12/2023, was prescribed macrobid . Culture came back negative for infection (<10K colonies/ml mixed urogenital flora), and was advised to stop the antibiotics. Symptoms resolved.    Pulmonary nodule: She saw Dr. Shyrl (thoracic surgeon) 8/1 to discuss right upper lobe pulmonary nodule. It was originally biopsied 10/2023, but the results were inconclusive. Atypical bronchial cells noted. She subsequently underwent surveillance imaging, and it shows that the nodule has increased in size, and now has a solid component.  She is planning for robotic assisted wedge resection by Dr. Lyell on 8/14.   She saw SaraBeth on 7/25 with L sided posterior headaches. She had episodes of deep ache at L scalp, posteriorly, exacerbated by neck movements.  Episodes could last 15 minutes to several hours. Felt to be related to muscle tension and spasm. She ws prescribed baclofen  to use at bedtime x 4d. She was given nurtec samples, and neck exercises.   UPDATE ***   PMH, PSH, SH reviewed   ROS:  no chills. Denies flank pain No nausea, vomiting, diarrhea or constipation. No vaginal discharge. No blood in the urine. No CP, SOB, bleeding, bruising or other concerns.    PHYSICAL EXAM:  LMP  (LMP Unknown)   Wt Readings from Last 3 Encounters:  03/11/24 131 lb 14.4 oz (59.8 kg)  03/04/24 131 lb 12.8 oz (59.8 kg)  02/24/24 131 lb 12.8 oz (59.8 kg)   Pleasant, well-appearing female in no distress HEENT: conjunctiva and sclera are clear, EOMI. OP clear, moist mucus membranes Neck: no lymphadenopathy or mass Heart: regular rate and rhythm Lungs: clear bilaterally Back: no spinal or CVA  tenderness Abdomen: soft, nontender *** Neuro: alert and oriented, cranial nerves grossly intact, normal gait Psych: normal mood, affect    ASSESSMENT/PLAN:   Needs u/a

## 2024-03-16 NOTE — Telephone Encounter (Signed)
 FYI Only or Action Required?: Action required by provider: request for appointment and unsure if patient will follow triage's recommendation.  Patient was last seen in primary care on 03/04/2024 by Early, Camie BRAVO, NP.  Called Nurse Triage reporting Abdominal Pain.  Symptoms began several days ago.  Interventions attempted: Rest, hydration, or home remedies.  Symptoms are: unchanged.  Triage Disposition: Go to ED Now (Notify PCP)  Patient/caregiver understands and will follow disposition?: Unsure  Copied from CRM [id number]. Topic: Clinical - Red Word Triage >> Mar 16, 2024 11:08 AM Gennette ORN wrote: Patient is calling about her stomach pain and she wants to know can she get that appointment back for tomorrow. Reason for Disposition  [1] SEVERE pain AND [2] age > 60 years  Answer Assessment - Initial Assessment Questions 1. LOCATION: Where does it hurt?      Lower abdominal area 2. RADIATION: Does the pain shoot anywhere else? (e.g., chest, back)     Lower back pain 3. ONSET: When did the pain begin? (e.g., minutes, hours or days ago)      Started last week 4. SUDDEN: Gradual or sudden onset?     Sudden 5. PATTERN Does the pain come and go, or is it constant?      Comes and goes 6. SEVERITY: How bad is the pain?  (e.g., Scale 1-10; mild, moderate, or severe)     10 7. RECURRENT SYMPTOM: Have you ever had this type of stomach pain before? If Yes, ask: When was the last time? and What happened that time?      no 8. CAUSE: What do you think is causing the stomach pain? (e.g., gallstones, recent abdominal surgery)     unsure 9. RELIEVING/AGGRAVATING FACTORS: What makes it better or worse? (e.g., antacids, bending or twisting motion, bowel movement)     no 10. OTHER SYMPTOMS: Do you have any other symptoms? (e.g., back pain, diarrhea, fever, urination pain, vomiting)       No  Patient is recommended to Emergency Department today due to abdominal pain. Patient  is asking for an appointment with Dr. Randol. Patient had been scheduled for an appointment for tomorrow but canceled due to feeling better. Patient states abdominal pain increased and is now wanting to be seen. Patient is encouraged to be evaluated in the Emergency Department. Patient verbalized understanding but unsure if patient will utilize the ED.  Protocols used: Abdominal Pain - Female-A-AH

## 2024-03-17 ENCOUNTER — Ambulatory Visit: Admitting: Family Medicine

## 2024-03-17 ENCOUNTER — Ambulatory Visit (INDEPENDENT_AMBULATORY_CARE_PROVIDER_SITE_OTHER): Admitting: Family Medicine

## 2024-03-17 ENCOUNTER — Encounter: Payer: Self-pay | Admitting: Family Medicine

## 2024-03-17 VITALS — BP 110/60 | HR 72 | Temp 99.0°F | Ht 64.0 in | Wt 131.0 lb

## 2024-03-17 DIAGNOSIS — R109 Unspecified abdominal pain: Secondary | ICD-10-CM

## 2024-03-17 DIAGNOSIS — K5900 Constipation, unspecified: Secondary | ICD-10-CM

## 2024-03-17 DIAGNOSIS — R911 Solitary pulmonary nodule: Secondary | ICD-10-CM | POA: Diagnosis not present

## 2024-03-17 DIAGNOSIS — R8290 Unspecified abnormal findings in urine: Secondary | ICD-10-CM

## 2024-03-17 LAB — POCT URINALYSIS DIP (PROADVANTAGE DEVICE)
Blood, UA: NEGATIVE
Glucose, UA: 100 mg/dL — AB
Ketones, POC UA: NEGATIVE mg/dL
Nitrite, UA: NEGATIVE
Specific Gravity, Urine: 1.005
Urobilinogen, Ur: 0.2
pH, UA: 6.5 (ref 5.0–8.0)

## 2024-03-17 NOTE — Patient Instructions (Signed)
 Drink plenty of fluids. Try some extra fiber today to help with the constipation (Benefiber, Metamucil, prunes, etc). We discussed the potential causes of the pain. Consider trying heating pad in case there is a muscular component. Try Kegel's in case there is some relaxation of the muscles that is contributing to discomfort (that might be exacerbated by the constipation). We are sending the curing for culture since there was white cells in the urine.  I doubt it is an infection given your lack of urinary symptoms, but your pain is over the bladder. You do have known large stones in the bladder, so this is also possibly contributing to your pain. If this pain doesn't resolve, you may want to follow up with your urologist.  Blood work and imaging may be next steps if your symptoms worsen (fever, worsening pain, etc).

## 2024-03-21 ENCOUNTER — Ambulatory Visit: Payer: Self-pay | Admitting: Family Medicine

## 2024-03-21 ENCOUNTER — Ambulatory Visit (HOSPITAL_BASED_OUTPATIENT_CLINIC_OR_DEPARTMENT_OTHER)
Admission: RE | Admit: 2024-03-21 | Discharge: 2024-03-21 | Disposition: A | Discharge status: Home / Self Care | Source: Ambulatory Visit | Attending: Family Medicine | Admitting: Family Medicine

## 2024-03-21 ENCOUNTER — Encounter (HOSPITAL_COMMUNITY): Payer: Self-pay

## 2024-03-21 DIAGNOSIS — M810 Age-related osteoporosis without current pathological fracture: Secondary | ICD-10-CM | POA: Diagnosis present

## 2024-03-21 DIAGNOSIS — M8589 Other specified disorders of bone density and structure, multiple sites: Secondary | ICD-10-CM | POA: Diagnosis not present

## 2024-03-21 DIAGNOSIS — Z780 Asymptomatic menopausal state: Secondary | ICD-10-CM | POA: Diagnosis not present

## 2024-03-21 LAB — URINE CULTURE

## 2024-03-21 NOTE — Pre-Procedure Instructions (Signed)
 Surgical Instructions   Your procedure is scheduled on March 24, 2024. Report to [facility] Main Entrance A at 5:30 A.M., then check in with the Admitting office. Any questions or running late day of surgery: call [phone]  Questions prior to your surgery date: call [phone], Monday-Friday, 8am-4pm. If you experience any cold or flu symptoms such as cough, fever, chills, shortness of breath, etc. between now and your scheduled surgery, please notify us  at the above number.     Remember:  Do not eat or drink after midnight the night before your surgery   Take these medicines the morning of surgery with A SIP OF WATER : amLODipine  (NORVASC ) atorvastatin  (LIPITOR)  SYNTHROID     Continue taking your Aspirin  through the day before surgery. DO NOT take any the morning of surgery.   One week prior to surgery, STOP taking any Aleve, Naproxen, Ibuprofen, Motrin, Advil, Goody's, BC's, all herbal medications, fish oil, and non-prescription vitamins.                     Do NOT Smoke (Tobacco/Vaping) for 24 hours prior to your procedure.  If you use a CPAP at night, you may bring your mask/headgear for your overnight stay.   You will be asked to remove any contacts, glasses, piercing's, hearing aid's, dentures/partials prior to surgery. Please bring cases for these items if needed.    Patients discharged the day of surgery will not be allowed to drive home, and someone needs to stay with them for 24 hours.  SURGICAL WAITING ROOM VISITATION Patients may have no more than 2 support people in the waiting area - these visitors may rotate.   Pre-op nurse will coordinate an appropriate time for 1 ADULT support person, who may not rotate, to accompany patient in pre-op.  Children under the age of 59 must have an adult with them who is not the patient and must remain in the main waiting area with an adult.  If the patient needs to stay at the hospital during part of their recovery, the  visitor guidelines for inpatient rooms apply.  Please refer to the [facility] website for the visitor guidelines for any additional information.   If you received a COVID test during your pre-op visit  it is requested that you wear a mask when out in public, stay away from anyone that may not be feeling well and notify your surgeon if you develop symptoms. If you have been in contact with anyone that has tested positive in the last 10 days please notify you surgeon.      Pre-operative CHG Bathing Instructions   You can play a key role in reducing the risk of infection after surgery. Your skin needs to be as free of germs as possible. You can reduce the number of germs on your skin by washing with CHG (chlorhexidine  gluconate) soap before surgery. CHG is an antiseptic soap that kills germs and continues to kill germs even after washing.   DO NOT use if you have an allergy to chlorhexidine /CHG or antibacterial soaps. If your skin becomes reddened or irritated, stop using the CHG and notify one of our RNs at [phone].              TAKE A SHOWER THE NIGHT BEFORE SURGERY AND THE DAY OF SURGERY    Please keep in mind the following:  DO NOT shave, including legs and underarms, 48 hours prior to surgery.   You may shave your face  before/day of surgery.  Place clean sheets on your bed the night before surgery Use a clean washcloth (not used since being washed) for each shower. DO NOT sleep with pet's night before surgery.  CHG Shower Instructions:  Wash your face and private area with normal soap. If you choose to wash your hair, wash first with your normal shampoo.  After you use shampoo/soap, rinse your hair and body thoroughly to remove shampoo/soap residue.  Turn the water  OFF and apply half the bottle of CHG soap to a CLEAN washcloth.  Apply CHG soap ONLY FROM YOUR NECK DOWN TO YOUR TOES (washing for 3-5 minutes)  DO NOT use CHG soap on face, private areas, open wounds, or sores.  Pay  special attention to the area where your surgery is being performed.  If you are having back surgery, having someone wash your back for you may be helpful. Wait 2 minutes after CHG soap is applied, then you may rinse off the CHG soap.  Pat dry with a clean towel  Put on clean pajamas    Additional instructions for the day of surgery: DO NOT APPLY any lotions, deodorants, cologne, or perfumes.   Do not wear jewelry or makeup Do not wear nail polish, gel polish, artificial nails, or any other type of covering on natural nails (fingers and toes) Do not bring valuables to the hospital. [facility] is not responsible for valuables/personal belongings. Put on clean/comfortable clothes.  Please brush your teeth.  Ask your nurse before applying any prescription medications to the skin.

## 2024-03-22 ENCOUNTER — Ambulatory Visit (HOSPITAL_COMMUNITY)
Admission: RE | Admit: 2024-03-22 | Discharge: 2024-03-22 | Disposition: A | Discharge status: Home / Self Care | Source: Ambulatory Visit | Attending: Thoracic Surgery (Cardiothoracic Vascular Surgery) | Admitting: Thoracic Surgery (Cardiothoracic Vascular Surgery)

## 2024-03-22 ENCOUNTER — Other Ambulatory Visit: Payer: Self-pay

## 2024-03-22 ENCOUNTER — Encounter (HOSPITAL_COMMUNITY): Payer: Self-pay

## 2024-03-22 ENCOUNTER — Encounter (HOSPITAL_COMMUNITY)
Admission: RE | Admit: 2024-03-22 | Discharge: 2024-03-22 | Disposition: A | Discharge status: Home / Self Care | Source: Ambulatory Visit | Attending: Thoracic Surgery (Cardiothoracic Vascular Surgery) | Admitting: Thoracic Surgery (Cardiothoracic Vascular Surgery)

## 2024-03-22 VITALS — BP 126/62 | HR 73 | Temp 98.7°F | Resp 18 | Ht 64.0 in | Wt 131.5 lb

## 2024-03-22 DIAGNOSIS — Z48813 Encounter for surgical aftercare following surgery on the respiratory system: Secondary | ICD-10-CM | POA: Diagnosis not present

## 2024-03-22 DIAGNOSIS — Z96652 Presence of left artificial knee joint: Secondary | ICD-10-CM | POA: Diagnosis not present

## 2024-03-22 DIAGNOSIS — R0602 Shortness of breath: Secondary | ICD-10-CM | POA: Insufficient documentation

## 2024-03-22 DIAGNOSIS — Z9842 Cataract extraction status, left eye: Secondary | ICD-10-CM | POA: Diagnosis not present

## 2024-03-22 DIAGNOSIS — Z90722 Acquired absence of ovaries, bilateral: Secondary | ICD-10-CM | POA: Diagnosis not present

## 2024-03-22 DIAGNOSIS — Z9079 Acquired absence of other genital organ(s): Secondary | ICD-10-CM | POA: Diagnosis not present

## 2024-03-22 DIAGNOSIS — Z9841 Cataract extraction status, right eye: Secondary | ICD-10-CM | POA: Diagnosis not present

## 2024-03-22 DIAGNOSIS — C3411 Malignant neoplasm of upper lobe, right bronchus or lung: Secondary | ICD-10-CM | POA: Diagnosis not present

## 2024-03-22 DIAGNOSIS — Z86718 Personal history of other venous thrombosis and embolism: Secondary | ICD-10-CM | POA: Diagnosis not present

## 2024-03-22 DIAGNOSIS — Z860101 Personal history of adenomatous and serrated colon polyps: Secondary | ICD-10-CM | POA: Diagnosis not present

## 2024-03-22 DIAGNOSIS — J939 Pneumothorax, unspecified: Secondary | ICD-10-CM | POA: Diagnosis not present

## 2024-03-22 DIAGNOSIS — Z8249 Family history of ischemic heart disease and other diseases of the circulatory system: Secondary | ICD-10-CM | POA: Diagnosis not present

## 2024-03-22 DIAGNOSIS — Z4682 Encounter for fitting and adjustment of non-vascular catheter: Secondary | ICD-10-CM | POA: Diagnosis not present

## 2024-03-22 DIAGNOSIS — Z833 Family history of diabetes mellitus: Secondary | ICD-10-CM | POA: Diagnosis not present

## 2024-03-22 DIAGNOSIS — Z823 Family history of stroke: Secondary | ICD-10-CM | POA: Diagnosis not present

## 2024-03-22 DIAGNOSIS — Z79890 Hormone replacement therapy (postmenopausal): Secondary | ICD-10-CM | POA: Diagnosis not present

## 2024-03-22 DIAGNOSIS — I10 Essential (primary) hypertension: Secondary | ICD-10-CM | POA: Diagnosis not present

## 2024-03-22 DIAGNOSIS — Z981 Arthrodesis status: Secondary | ICD-10-CM | POA: Diagnosis not present

## 2024-03-22 DIAGNOSIS — R911 Solitary pulmonary nodule: Secondary | ICD-10-CM

## 2024-03-22 DIAGNOSIS — E039 Hypothyroidism, unspecified: Secondary | ICD-10-CM | POA: Diagnosis not present

## 2024-03-22 DIAGNOSIS — M810 Age-related osteoporosis without current pathological fracture: Secondary | ICD-10-CM | POA: Diagnosis not present

## 2024-03-22 DIAGNOSIS — J9811 Atelectasis: Secondary | ICD-10-CM | POA: Diagnosis not present

## 2024-03-22 DIAGNOSIS — Z800 Family history of malignant neoplasm of digestive organs: Secondary | ICD-10-CM | POA: Diagnosis not present

## 2024-03-22 DIAGNOSIS — Z87891 Personal history of nicotine dependence: Secondary | ICD-10-CM | POA: Diagnosis not present

## 2024-03-22 DIAGNOSIS — R918 Other nonspecific abnormal finding of lung field: Secondary | ICD-10-CM | POA: Diagnosis not present

## 2024-03-22 DIAGNOSIS — Z90710 Acquired absence of both cervix and uterus: Secondary | ICD-10-CM | POA: Diagnosis not present

## 2024-03-22 DIAGNOSIS — Z01818 Encounter for other preprocedural examination: Secondary | ICD-10-CM | POA: Diagnosis not present

## 2024-03-22 DIAGNOSIS — E785 Hyperlipidemia, unspecified: Secondary | ICD-10-CM | POA: Diagnosis not present

## 2024-03-22 DIAGNOSIS — I700 Atherosclerosis of aorta: Secondary | ICD-10-CM | POA: Diagnosis not present

## 2024-03-22 DIAGNOSIS — T797XXA Traumatic subcutaneous emphysema, initial encounter: Secondary | ICD-10-CM | POA: Diagnosis not present

## 2024-03-22 DIAGNOSIS — Z825 Family history of asthma and other chronic lower respiratory diseases: Secondary | ICD-10-CM | POA: Diagnosis not present

## 2024-03-22 DIAGNOSIS — J9382 Other air leak: Secondary | ICD-10-CM | POA: Diagnosis not present

## 2024-03-22 DIAGNOSIS — Z7982 Long term (current) use of aspirin: Secondary | ICD-10-CM | POA: Diagnosis not present

## 2024-03-22 LAB — COMPREHENSIVE METABOLIC PANEL WITH GFR
ALT: 31 U/L (ref 0–44)
AST: 30 U/L (ref 15–41)
Albumin: 3.8 g/dL (ref 3.5–5.0)
Alkaline Phosphatase: 43 U/L (ref 38–126)
Anion gap: 12 (ref 5–15)
BUN: 21 mg/dL (ref 8–23)
CO2: 26 mmol/L (ref 22–32)
Calcium: 9.9 mg/dL (ref 8.9–10.3)
Chloride: 102 mmol/L (ref 98–111)
Creatinine, Ser: 0.94 mg/dL (ref 0.44–1.00)
GFR, Estimated: >60 mL/min (ref >60)
Glucose, Bld: 104 mg/dL — ABNORMAL HIGH (ref 70–99)
Potassium: 3.6 mmol/L (ref 3.5–5.1)
Sodium: 140 mmol/L (ref 135–145)
Total Bilirubin: 0.4 mg/dL (ref 0.0–1.2)
Total Protein: 7.4 g/dL (ref 6.5–8.1)

## 2024-03-22 LAB — URINALYSIS, ROUTINE W REFLEX MICROSCOPIC
Bilirubin Urine: NEGATIVE
Glucose, UA: NEGATIVE mg/dL
Hgb urine dipstick: NEGATIVE
Ketones, ur: NEGATIVE mg/dL
Nitrite: NEGATIVE
Protein, ur: NEGATIVE mg/dL
Specific Gravity, Urine: 1.018 (ref 1.005–1.030)
pH: 7 (ref 5.0–8.0)

## 2024-03-22 LAB — SURGICAL PCR SCREEN
MRSA, PCR: NEGATIVE
Staphylococcus aureus: NEGATIVE

## 2024-03-22 LAB — TYPE AND SCREEN
ABO/RH(D): A POS
Antibody Screen: NEGATIVE

## 2024-03-22 LAB — PROTIME-INR
INR: 1 (ref 0.8–1.2)
Prothrombin Time: 14.1 s (ref 11.4–15.2)

## 2024-03-22 LAB — CBC
HCT: 37.1 % (ref 36.0–46.0)
Hemoglobin: 12.3 g/dL (ref 12.0–15.0)
MCH: 33.5 pg (ref 26.0–34.0)
MCHC: 33.2 g/dL (ref 30.0–36.0)
MCV: 101.1 fL — ABNORMAL HIGH (ref 80.0–100.0)
Platelets: 340 K/uL (ref 150–400)
RBC: 3.67 MIL/uL — ABNORMAL LOW (ref 3.87–5.11)
RDW: 12.6 % (ref 11.5–15.5)
WBC: 11.7 K/uL — ABNORMAL HIGH (ref 4.0–10.5)
nRBC: 0 % (ref 0.0–0.2)

## 2024-03-22 LAB — APTT: aPTT: 25 s (ref 24–36)

## 2024-03-22 NOTE — Progress Notes (Signed)
 Bernardino Sprang, RN with [facility] made aware of abnormal UA results

## 2024-03-22 NOTE — Progress Notes (Signed)
 PCP - Dr. Annabelle Fetters Cardiologist - Dr. Gordy Bergamo - last office visit 12/01/2023 with PRN follow-up Pulmonologist - Dr. Lamar Chris - last office visit 02/24/2024 with NP  PPM/ICD - Denies Device Orders - n/a Rep Notified - n/a  Chest x-ray - 03/22/2024 EKG - 03/22/2024 Stress Test - 09/21/2023 ECHO - 12/30/2023 Cardiac Cath - Denies  Sleep Study - Denies CPAP - n/a  Pt is Pre-DM  Last dose of GLP1 agonist- n/a GLP1 instructions: n/a  Blood Thinner Instructions: n/a Aspirin  Instructions: Pt instructed to continue taking ASA through the day before surgery and none the morning of surgery  NPO after midnight  COVID TEST- n/a   Anesthesia review: Yes. Hx of HTN, heart murmur, heterozygous for factor V mutation, DVT in 2014 s/p completion of blood thinner and abnormal EKG review.   Patient denies shortness of breath, fever, cough and chest pain at PAT appointment. Pt denies any respiratory illness/infection in the last two months.   All instructions explained to the patient, with a verbal understanding of the material. Patient agrees to go over the instructions while at home for a better understanding. Patient also instructed to self quarantine after being tested for COVID-19. The opportunity to ask questions was provided.
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
## 2024-03-23 NOTE — Anesthesia Preprocedure Evaluation (Addendum)
 Anesthesia Evaluation  Patient identified by MRN, date of birth, ID band Patient awake    Reviewed: Allergy & Precautions, NPO status , Patient's Chart, lab work & pertinent test results, reviewed documented beta blocker date and time   History of Anesthesia Complications Negative for: history of anesthetic complications  Airway Mallampati: II  TM Distance: >3 FB     Dental no notable dental hx.    Pulmonary shortness of breath and with exertion, neg COPD, neg recent URI, former smoker   breath sounds clear to auscultation       Cardiovascular hypertension, (-) angina (-) CAD, (-) Past MI and (-) Cardiac Stents + Valvular Problems/Murmurs  Rhythm:Regular Rate:Normal     Neuro/Psych neg Seizures    GI/Hepatic ,GERD  Medicated and Controlled,,(+) neg Cirrhosis        Endo/Other  Hypothyroidism    Renal/GU Renal disease     Musculoskeletal  (+) Arthritis ,    Abdominal   Peds  Hematology  (+) Blood dyscrasia, anemia   Anesthesia Other Findings   Reproductive/Obstetrics                              Anesthesia Physical Anesthesia Plan  ASA: 3  Anesthesia Plan: General   Post-op Pain Management: Tylenol  PO (pre-op)*   Induction: Intravenous  PONV Risk Score and Plan: 3 and Ondansetron , Dexamethasone , Propofol  infusion and Treatment may vary due to age or medical condition  Airway Management Planned: Oral ETT and Double Lumen EBT  Additional Equipment: ClearSight  Intra-op Plan:   Post-operative Plan: Extubation in OR  Informed Consent: I have reviewed the patients History and Physical, chart, labs and discussed the procedure including the risks, benefits and alternatives for the proposed anesthesia with the patient or authorized representative who has indicated his/her understanding and acceptance.     Dental advisory given  Plan Discussed with: CRNA  Anesthesia Plan  Comments: (2IV's, clearsight. ETT followed by double lumen EBT  )        Anesthesia Quick Evaluation

## 2024-03-24 ENCOUNTER — Encounter (HOSPITAL_COMMUNITY)
Admission: RE | Disposition: A | Discharge status: Home / Self Care | Payer: Self-pay | Source: Home / Self Care | Attending: Thoracic Surgery (Cardiothoracic Vascular Surgery)

## 2024-03-24 ENCOUNTER — Other Ambulatory Visit: Payer: Self-pay

## 2024-03-24 ENCOUNTER — Ambulatory Visit (HOSPITAL_COMMUNITY)

## 2024-03-24 ENCOUNTER — Inpatient Hospital Stay (HOSPITAL_COMMUNITY)

## 2024-03-24 ENCOUNTER — Inpatient Hospital Stay (HOSPITAL_COMMUNITY)
Admission: RE | Admit: 2024-03-24 | Discharge: 2024-03-25 | DRG: 164 | Disposition: A | Discharge status: Home / Self Care | Attending: Thoracic Surgery (Cardiothoracic Vascular Surgery) | Admitting: Thoracic Surgery (Cardiothoracic Vascular Surgery)

## 2024-03-24 ENCOUNTER — Ambulatory Visit (HOSPITAL_COMMUNITY): Payer: Self-pay | Admitting: Vascular Surgery

## 2024-03-24 ENCOUNTER — Encounter (HOSPITAL_COMMUNITY): Payer: Self-pay | Admitting: Thoracic Surgery (Cardiothoracic Vascular Surgery)

## 2024-03-24 ENCOUNTER — Ambulatory Visit (HOSPITAL_COMMUNITY): Payer: Self-pay | Admitting: Certified Registered"

## 2024-03-24 DIAGNOSIS — C3411 Malignant neoplasm of upper lobe, right bronchus or lung: Principal | ICD-10-CM | POA: Diagnosis present

## 2024-03-24 DIAGNOSIS — Z7982 Long term (current) use of aspirin: Secondary | ICD-10-CM | POA: Diagnosis not present

## 2024-03-24 DIAGNOSIS — Z860101 Personal history of adenomatous and serrated colon polyps: Secondary | ICD-10-CM

## 2024-03-24 DIAGNOSIS — Z823 Family history of stroke: Secondary | ICD-10-CM

## 2024-03-24 DIAGNOSIS — Z9841 Cataract extraction status, right eye: Secondary | ICD-10-CM | POA: Diagnosis not present

## 2024-03-24 DIAGNOSIS — Z79899 Other long term (current) drug therapy: Secondary | ICD-10-CM

## 2024-03-24 DIAGNOSIS — Z902 Acquired absence of lung [part of]: Secondary | ICD-10-CM

## 2024-03-24 DIAGNOSIS — I2510 Atherosclerotic heart disease of native coronary artery without angina pectoris: Secondary | ICD-10-CM | POA: Diagnosis present

## 2024-03-24 DIAGNOSIS — I10 Essential (primary) hypertension: Secondary | ICD-10-CM | POA: Diagnosis not present

## 2024-03-24 DIAGNOSIS — E039 Hypothyroidism, unspecified: Secondary | ICD-10-CM | POA: Diagnosis present

## 2024-03-24 DIAGNOSIS — Z9079 Acquired absence of other genital organ(s): Secondary | ICD-10-CM | POA: Diagnosis not present

## 2024-03-24 DIAGNOSIS — Z90722 Acquired absence of ovaries, bilateral: Secondary | ICD-10-CM

## 2024-03-24 DIAGNOSIS — Z800 Family history of malignant neoplasm of digestive organs: Secondary | ICD-10-CM

## 2024-03-24 DIAGNOSIS — Z86718 Personal history of other venous thrombosis and embolism: Secondary | ICD-10-CM | POA: Diagnosis not present

## 2024-03-24 DIAGNOSIS — Z961 Presence of intraocular lens: Secondary | ICD-10-CM | POA: Diagnosis present

## 2024-03-24 DIAGNOSIS — R911 Solitary pulmonary nodule: Secondary | ICD-10-CM

## 2024-03-24 DIAGNOSIS — Z8249 Family history of ischemic heart disease and other diseases of the circulatory system: Secondary | ICD-10-CM

## 2024-03-24 DIAGNOSIS — Z90710 Acquired absence of both cervix and uterus: Secondary | ICD-10-CM

## 2024-03-24 DIAGNOSIS — Z825 Family history of asthma and other chronic lower respiratory diseases: Secondary | ICD-10-CM | POA: Diagnosis not present

## 2024-03-24 DIAGNOSIS — R7303 Prediabetes: Secondary | ICD-10-CM | POA: Diagnosis present

## 2024-03-24 DIAGNOSIS — E785 Hyperlipidemia, unspecified: Secondary | ICD-10-CM | POA: Diagnosis present

## 2024-03-24 DIAGNOSIS — Z9842 Cataract extraction status, left eye: Secondary | ICD-10-CM | POA: Diagnosis not present

## 2024-03-24 DIAGNOSIS — M810 Age-related osteoporosis without current pathological fracture: Secondary | ICD-10-CM | POA: Diagnosis present

## 2024-03-24 DIAGNOSIS — J9382 Other air leak: Secondary | ICD-10-CM | POA: Diagnosis not present

## 2024-03-24 DIAGNOSIS — Z833 Family history of diabetes mellitus: Secondary | ICD-10-CM

## 2024-03-24 DIAGNOSIS — I700 Atherosclerosis of aorta: Secondary | ICD-10-CM | POA: Diagnosis present

## 2024-03-24 DIAGNOSIS — Z87891 Personal history of nicotine dependence: Secondary | ICD-10-CM | POA: Diagnosis not present

## 2024-03-24 DIAGNOSIS — Z9889 Other specified postprocedural states: Principal | ICD-10-CM

## 2024-03-24 DIAGNOSIS — Z79890 Hormone replacement therapy (postmenopausal): Secondary | ICD-10-CM | POA: Diagnosis not present

## 2024-03-24 DIAGNOSIS — Z96652 Presence of left artificial knee joint: Secondary | ICD-10-CM | POA: Diagnosis present

## 2024-03-24 DIAGNOSIS — Z981 Arthrodesis status: Secondary | ICD-10-CM | POA: Diagnosis not present

## 2024-03-24 DIAGNOSIS — Z01818 Encounter for other preprocedural examination: Secondary | ICD-10-CM

## 2024-03-24 DIAGNOSIS — R519 Headache, unspecified: Secondary | ICD-10-CM | POA: Diagnosis present

## 2024-03-24 DIAGNOSIS — R0602 Shortness of breath: Secondary | ICD-10-CM

## 2024-03-24 HISTORY — PX: VIDEO BRONCHOSCOPY WITH ENDOBRONCHIAL NAVIGATION: SHX6175

## 2024-03-24 HISTORY — PX: INTERCOSTAL NERVE BLOCK: SHX5021

## 2024-03-24 HISTORY — PX: WEDGE RESECTION, LUNG, ROBOT-ASSISTED, THORACOSCOPIC: SHX7655

## 2024-03-24 SURGERY — WEDGE RESECTION, LUNG, ROBOT-ASSISTED, THORACOSCOPIC
Anesthesia: General | Site: Chest | Laterality: Right

## 2024-03-24 SURGERY — VIDEO BRONCHOSCOPY WITH ENDOBRONCHIAL NAVIGATION
Anesthesia: General

## 2024-03-24 MED ORDER — ONDANSETRON HCL 4 MG/2ML IJ SOLN
INTRAMUSCULAR | Status: AC
  Filled 2024-03-24: qty 2

## 2024-03-24 MED ORDER — LIDOCAINE 2% (20 MG/ML) 5 ML SYRINGE
INTRAMUSCULAR | Status: DC | PRN
  Administered 2024-03-24: 60 mg via INTRAVENOUS

## 2024-03-24 MED ORDER — METHYLENE BLUE (ANTIDOTE) 1 % IV SOLN
INTRAVENOUS | Status: DC | PRN
  Administered 2024-03-24: 1 mL

## 2024-03-24 MED ORDER — LEVOTHYROXINE SODIUM 50 MCG PO TABS
50.0000 ug | ORAL_TABLET | Freq: Every day | ORAL | Status: DC
  Administered 2024-03-25: 50 ug via ORAL
  Filled 2024-03-24: qty 1

## 2024-03-24 MED ORDER — SUGAMMADEX SODIUM 200 MG/2ML IV SOLN
INTRAVENOUS | Status: DC | PRN
  Administered 2024-03-24: 200 mg via INTRAVENOUS

## 2024-03-24 MED ORDER — CHLORHEXIDINE GLUCONATE 0.12 % MT SOLN
15.0000 mL | Freq: Once | OROMUCOSAL | Status: AC
  Administered 2024-03-24: 15 mL via OROMUCOSAL
  Filled 2024-03-24: qty 15

## 2024-03-24 MED ORDER — BUPIVACAINE LIPOSOME 1.3 % IJ SUSP
INTRAMUSCULAR | Status: DC | PRN
  Administered 2024-03-24: 100 mL

## 2024-03-24 MED ORDER — PANTOPRAZOLE SODIUM 40 MG PO TBEC
40.0000 mg | DELAYED_RELEASE_TABLET | Freq: Every day | ORAL | Status: DC
  Administered 2024-03-25: 40 mg via ORAL
  Filled 2024-03-24: qty 1

## 2024-03-24 MED ORDER — LACTATED RINGERS IV SOLN: INTRAVENOUS | Status: DC

## 2024-03-24 MED ORDER — FENTANYL CITRATE (PF) 100 MCG/2ML IJ SOLN
25.0000 ug | INTRAMUSCULAR | Status: DC | PRN
  Administered 2024-03-24: 50 ug via INTRAVENOUS

## 2024-03-24 MED ORDER — LOSARTAN POTASSIUM 50 MG PO TABS
100.0000 mg | ORAL_TABLET | Freq: Every day | ORAL | Status: DC
  Administered 2024-03-24 – 2024-03-25 (×2): 100 mg via ORAL
  Filled 2024-03-24 (×2): qty 2

## 2024-03-24 MED ORDER — PHENYLEPHRINE HCL-NACL 20-0.9 MG/250ML-% IV SOLN
INTRAVENOUS | Status: DC | PRN
  Administered 2024-03-24: 50 ug/min via INTRAVENOUS

## 2024-03-24 MED ORDER — ACETAMINOPHEN 500 MG PO TABS
1000.0000 mg | ORAL_TABLET | Freq: Once | ORAL | Status: AC
  Administered 2024-03-24: 1000 mg via ORAL
  Filled 2024-03-24: qty 2

## 2024-03-24 MED ORDER — ALBUTEROL SULFATE (2.5 MG/3ML) 0.083% IN NEBU: 2.5000 mg | INHALATION_SOLUTION | RESPIRATORY_TRACT | Status: DC | PRN

## 2024-03-24 MED ORDER — 0.9 % SODIUM CHLORIDE (POUR BTL) OPTIME
TOPICAL | Status: DC | PRN
  Administered 2024-03-24: 2000 mL

## 2024-03-24 MED ORDER — BUPIVACAINE LIPOSOME 1.3 % IJ SUSP
INTRAMUSCULAR | Status: AC
  Filled 2024-03-24: qty 20

## 2024-03-24 MED ORDER — ACETAMINOPHEN 160 MG/5ML PO SOLN: 1000.0000 mg | Freq: Four times a day (QID) | ORAL | Status: DC

## 2024-03-24 MED ORDER — ALBUTEROL SULFATE HFA 108 (90 BASE) MCG/ACT IN AERS: 2.0000 | INHALATION_SPRAY | RESPIRATORY_TRACT | Status: DC | PRN

## 2024-03-24 MED ORDER — HYDROCHLOROTHIAZIDE 12.5 MG PO TABS
12.5000 mg | ORAL_TABLET | Freq: Every day | ORAL | Status: DC
  Administered 2024-03-24 – 2024-03-25 (×2): 12.5 mg via ORAL
  Filled 2024-03-24 (×2): qty 1

## 2024-03-24 MED ORDER — PROPOFOL 10 MG/ML IV BOLUS
INTRAVENOUS | Status: DC | PRN
  Administered 2024-03-24: 150 mg via INTRAVENOUS

## 2024-03-24 MED ORDER — ONDANSETRON HCL 4 MG/2ML IJ SOLN
4.0000 mg | Freq: Four times a day (QID) | INTRAMUSCULAR | Status: DC | PRN
  Filled 2024-03-24: qty 2

## 2024-03-24 MED ORDER — AMISULPRIDE (ANTIEMETIC) 5 MG/2ML IV SOLN: 10.0000 mg | Freq: Once | INTRAVENOUS | Status: DC | PRN

## 2024-03-24 MED ORDER — INDOCYANINE GREEN 25 MG IV SOLR
INTRAVENOUS | Status: AC
  Filled 2024-03-24: qty 10

## 2024-03-24 MED ORDER — PROPOFOL 10 MG/ML IV BOLUS
INTRAVENOUS | Status: AC
  Filled 2024-03-24: qty 20

## 2024-03-24 MED ORDER — ROCURONIUM BROMIDE 10 MG/ML (PF) SYRINGE
PREFILLED_SYRINGE | INTRAVENOUS | Status: DC | PRN
  Administered 2024-03-24: 20 mg via INTRAVENOUS
  Administered 2024-03-24 (×2): 50 mg via INTRAVENOUS

## 2024-03-24 MED ORDER — FENTANYL CITRATE (PF) 100 MCG/2ML IJ SOLN
INTRAMUSCULAR | Status: AC
  Filled 2024-03-24: qty 2

## 2024-03-24 MED ORDER — METHYLENE BLUE (ANTIDOTE) 1 % IV SOLN
INTRAVENOUS | Status: AC
  Filled 2024-03-24: qty 10

## 2024-03-24 MED ORDER — CEFAZOLIN SODIUM-DEXTROSE 2-4 GM/100ML-% IV SOLN
2.0000 g | INTRAVENOUS | Status: AC
  Administered 2024-03-24: 2 g via INTRAVENOUS
  Filled 2024-03-24: qty 100

## 2024-03-24 MED ORDER — ACETAMINOPHEN 500 MG PO TABS
1000.0000 mg | ORAL_TABLET | Freq: Four times a day (QID) | ORAL | Status: DC
  Administered 2024-03-24 – 2024-03-25 (×3): 1000 mg via ORAL
  Filled 2024-03-24 (×3): qty 2

## 2024-03-24 MED ORDER — ENOXAPARIN SODIUM 40 MG/0.4ML IJ SOSY
40.0000 mg | PREFILLED_SYRINGE | Freq: Every day | INTRAMUSCULAR | Status: DC
  Administered 2024-03-25: 40 mg via SUBCUTANEOUS
  Filled 2024-03-24: qty 0.4

## 2024-03-24 MED ORDER — DEXAMETHASONE SODIUM PHOSPHATE 10 MG/ML IJ SOLN
INTRAMUSCULAR | Status: DC | PRN
  Administered 2024-03-24: 10 mg via INTRAVENOUS

## 2024-03-24 MED ORDER — SENNOSIDES-DOCUSATE SODIUM 8.6-50 MG PO TABS
1.0000 | ORAL_TABLET | Freq: Every day | ORAL | Status: DC
  Filled 2024-03-24: qty 1

## 2024-03-24 MED ORDER — FENTANYL CITRATE (PF) 250 MCG/5ML IJ SOLN
INTRAMUSCULAR | Status: AC
Start: 2024-03-24 — End: 2024-03-24
  Filled 2024-03-24: qty 5

## 2024-03-24 MED ORDER — BUPIVACAINE HCL (PF) 0.25 % IJ SOLN
INTRAMUSCULAR | Status: AC
Start: 2024-03-24 — End: 2024-03-24
  Filled 2024-03-24: qty 30

## 2024-03-24 MED ORDER — CEFAZOLIN SODIUM-DEXTROSE 2-4 GM/100ML-% IV SOLN
2.0000 g | Freq: Three times a day (TID) | INTRAVENOUS | Status: AC
  Administered 2024-03-24 (×2): 2 g via INTRAVENOUS
  Filled 2024-03-24 (×2): qty 100

## 2024-03-24 MED ORDER — LACTATED RINGERS IV SOLN: INTRAVENOUS | Status: DC | PRN

## 2024-03-24 MED ORDER — ATORVASTATIN CALCIUM 40 MG PO TABS
40.0000 mg | ORAL_TABLET | Freq: Every day | ORAL | Status: DC
Start: 2024-03-24 — End: 2024-03-25
  Administered 2024-03-24 – 2024-03-25 (×2): 40 mg via ORAL
  Filled 2024-03-24 (×2): qty 1

## 2024-03-24 MED ORDER — AMLODIPINE BESYLATE 2.5 MG PO TABS
2.5000 mg | ORAL_TABLET | Freq: Every evening | ORAL | Status: DC
  Administered 2024-03-24: 2.5 mg via ORAL
  Filled 2024-03-24: qty 1

## 2024-03-24 MED ORDER — SODIUM CHLORIDE (PF) 0.9 % IJ SOLN
INTRAMUSCULAR | Status: AC
  Filled 2024-03-24: qty 50

## 2024-03-24 MED ORDER — BUPIVACAINE HCL (PF) 0.5 % IJ SOLN
INTRAMUSCULAR | Status: AC
  Filled 2024-03-24: qty 30

## 2024-03-24 MED ORDER — PROPOFOL 500 MG/50ML IV EMUL
INTRAVENOUS | Status: DC | PRN
Start: 2024-03-24 — End: 2024-03-24
  Administered 2024-03-24: 175 ug/kg/min via INTRAVENOUS

## 2024-03-24 MED ORDER — LOSARTAN POTASSIUM-HCTZ 100-12.5 MG PO TABS: 1.0000 | ORAL_TABLET | Freq: Every day | ORAL | Status: DC

## 2024-03-24 MED ORDER — FENTANYL CITRATE (PF) 250 MCG/5ML IJ SOLN
INTRAMUSCULAR | Status: DC | PRN
  Administered 2024-03-24 (×3): 50 ug via INTRAVENOUS

## 2024-03-24 MED ORDER — ONDANSETRON HCL 4 MG/2ML IJ SOLN
INTRAMUSCULAR | Status: DC | PRN
  Administered 2024-03-24: 4 mg via INTRAVENOUS

## 2024-03-24 MED ORDER — MORPHINE SULFATE (PF) 2 MG/ML IV SOLN
2.0000 mg | INTRAVENOUS | Status: DC | PRN
  Administered 2024-03-24 – 2024-03-25 (×3): 2 mg via INTRAVENOUS
  Filled 2024-03-24 (×3): qty 1

## 2024-03-24 MED ORDER — BISACODYL 5 MG PO TBEC
10.0000 mg | DELAYED_RELEASE_TABLET | Freq: Every day | ORAL | Status: DC
  Administered 2024-03-25: 10 mg via ORAL
  Filled 2024-03-24 (×2): qty 2

## 2024-03-24 MED ORDER — ORAL CARE MOUTH RINSE: 15.0000 mL | Freq: Once | OROMUCOSAL | Status: AC

## 2024-03-24 MED ORDER — OXYCODONE HCL 5 MG PO TABS
5.0000 mg | ORAL_TABLET | ORAL | Status: DC | PRN
  Administered 2024-03-24 – 2024-03-25 (×2): 5 mg via ORAL
  Filled 2024-03-24 (×2): qty 1

## 2024-03-24 SURGICAL SUPPLY — 79 items
BLADE CLIPPER SURG (BLADE) ×1 IMPLANT
BLADE SURG 11 STRL SS (BLADE) ×1 IMPLANT
CANISTER SUCTION 3000ML PPV (SUCTIONS) ×2 IMPLANT
CANNULA REDUCER 12-8 DVNC XI (CANNULA) ×2 IMPLANT
CATH THORACIC 28FR (CATHETERS) ×1 IMPLANT
CHLORAPREP W/TINT 26 (MISCELLANEOUS) ×1 IMPLANT
CLIP TI MEDIUM 6 (CLIP) IMPLANT
CNTNR URN SCR LID CUP LEK RST (MISCELLANEOUS) ×5 IMPLANT
CONN ST 1/4X3/8 BEN (MISCELLANEOUS) IMPLANT
DEFOGGER SCOPE WARM SEASHARP (MISCELLANEOUS) ×1 IMPLANT
DERMABOND ADVANCED .7 DNX12 (GAUZE/BANDAGES/DRESSINGS) ×1 IMPLANT
DRAIN CHANNEL 28F RND 3/8 FF (WOUND CARE) IMPLANT
DRAPE ARM DVNC X/XI (DISPOSABLE) ×4 IMPLANT
DRAPE COLUMN DVNC XI (DISPOSABLE) ×1 IMPLANT
DRAPE CV SPLIT W-CLR ANES SCRN (DRAPES) ×1 IMPLANT
DRAPE HALF SHEET 40X57 (DRAPES) ×1 IMPLANT
DRAPE SURG ORHT 6 SPLT 77X108 (DRAPES) ×1 IMPLANT
ELECT BLADE 6.5 EXT (BLADE) IMPLANT
ELECTRODE REM PT RTRN 9FT ADLT (ELECTROSURGICAL) ×1 IMPLANT
FORCEPS BPLR LNG DVNC XI (INSTRUMENTS) IMPLANT
FORCEPS CADIERE DVNC XI (FORCEP) IMPLANT
GAUZE KITTNER 4X5 RF (MISCELLANEOUS) ×1 IMPLANT
GAUZE KITTNER 4X8 (MISCELLANEOUS) ×1 IMPLANT
GAUZE SPONGE 4X4 12PLY STRL (GAUZE/BANDAGES/DRESSINGS) ×1 IMPLANT
GLOVE BIO SURGEON STRL SZ7.5 (GLOVE) ×2 IMPLANT
GLOVE INDICATOR 6.5 STRL GRN (GLOVE) IMPLANT
GLOVE SURG POLYISO LF SZ8 (GLOVE) ×1 IMPLANT
GLOVE SURG SS PI 8.0 STRL IVOR (GLOVE) ×1 IMPLANT
GOWN STRL REUS W/ TWL LRG LVL3 (GOWN DISPOSABLE) ×2 IMPLANT
GOWN STRL REUS W/ TWL XL LVL3 (GOWN DISPOSABLE) ×2 IMPLANT
GOWN STRL REUS W/TWL 2XL LVL3 (GOWN DISPOSABLE) ×1 IMPLANT
GOWN STRL SURGICAL XL XLNG (GOWN DISPOSABLE) ×3 IMPLANT
GRASPER TIP-UP FEN DVNC XI (INSTRUMENTS) IMPLANT
HEMOSTAT SURGICEL 2X14 (HEMOSTASIS) ×3 IMPLANT
IRRIGATION STRYKERFLOW (MISCELLANEOUS) IMPLANT
KIT BASIN OR (CUSTOM PROCEDURE TRAY) ×1 IMPLANT
KIT TURNOVER KIT B (KITS) ×1 IMPLANT
NDL 22X1.5 STRL (OR ONLY) (MISCELLANEOUS) ×1 IMPLANT
NEEDLE 22X1.5 STRL (OR ONLY) (MISCELLANEOUS) ×1 IMPLANT
NS IRRIG 1000ML POUR BTL (IV SOLUTION) ×3 IMPLANT
PACK CHEST (CUSTOM PROCEDURE TRAY) ×1 IMPLANT
PAD ARMBOARD POSITIONER FOAM (MISCELLANEOUS) ×5 IMPLANT
PORT ACCESS TROCAR AIRSEAL 12 (TROCAR) ×1 IMPLANT
RELOAD STAPLE 45 3.5 BLU DVNC (STAPLE) IMPLANT
SCISSORS LAP 5X35 DISP (ENDOMECHANICALS) IMPLANT
SEAL UNIV 5-12 XI (MISCELLANEOUS) ×4 IMPLANT
SEALANT PROGEL (MISCELLANEOUS) IMPLANT
SEALER LIGASURE MARYLAND 30 (ELECTROSURGICAL) IMPLANT
SET TRI-LUMEN FLTR TB AIRSEAL (TUBING) ×1 IMPLANT
SOLUTION ELECTROSURG ANTI STCK (MISCELLANEOUS) ×1 IMPLANT
SPONGE INTESTINAL PEANUT (DISPOSABLE) IMPLANT
SPONGE TONSIL 1 RF SGL (DISPOSABLE) IMPLANT
STAPLER 45 SUREFORM CVD DVNC (STAPLE) IMPLANT
STOPCOCK 4 WAY LG BORE MALE ST (IV SETS) ×1 IMPLANT
SUT MNCRL AB 3-0 PS2 18 (SUTURE) IMPLANT
SUT MON AB 2-0 CT1 36 (SUTURE) IMPLANT
SUT PDS AB 1 CTX 36 (SUTURE) IMPLANT
SUT PROLENE 4-0 RB1 .5 CRCL 36 (SUTURE) IMPLANT
SUT SILK 1 MH (SUTURE) ×1 IMPLANT
SUT SILK 1 TIES 10X30 (SUTURE) IMPLANT
SUT SILK 2 0 SH (SUTURE) IMPLANT
SUT SILK 2 0SH CR/8 30 (SUTURE) IMPLANT
SUT VIC AB 1 CTX36XBRD ANBCTR (SUTURE) IMPLANT
SUT VIC AB 2-0 CT1 TAPERPNT 27 (SUTURE) ×1 IMPLANT
SUT VIC AB 3-0 SH 27X BRD (SUTURE) ×3 IMPLANT
SUT VICRYL 0 TIES 12 18 (SUTURE) ×1 IMPLANT
SUT VICRYL 0 UR6 27IN ABS (SUTURE) ×2 IMPLANT
SYR 10ML LL (SYRINGE) ×1 IMPLANT
SYR 20ML LL LF (SYRINGE) ×1 IMPLANT
SYR 50ML LL SCALE MARK (SYRINGE) ×1 IMPLANT
SYSTEM RETRIEVAL ANCHOR 8 (MISCELLANEOUS) IMPLANT
SYSTEM SAHARA CHEST DRAIN ATS (WOUND CARE) ×1 IMPLANT
TAPE CLOTH 4X10 WHT NS (GAUZE/BANDAGES/DRESSINGS) ×1 IMPLANT
TAPE CLOTH SURG 4X10 WHT LF (GAUZE/BANDAGES/DRESSINGS) IMPLANT
TOWEL GREEN STERILE (TOWEL DISPOSABLE) ×1 IMPLANT
TRAY FOLEY MTR SLVR 16FR STAT (SET/KITS/TRAYS/PACK) ×1 IMPLANT
TRAY FOLEY W/BAG SLVR 14FR (SET/KITS/TRAYS/PACK) ×1 IMPLANT
TUBING EXTENTION W/L.L. (IV SETS) ×1 IMPLANT
WATER STERILE IRR 1000ML POUR (IV SOLUTION) ×1 IMPLANT

## 2024-03-24 NOTE — Brief Op Note (Addendum)
 03/24/2024  11:06 AM  PATIENT:  Dorothy Rojas  78 y.o. female  PRE-OPERATIVE DIAGNOSIS:  lung nodule  POST-OPERATIVE DIAGNOSIS:  lung nodule  PROCEDURE:   WEDGE RESECTION, LUNG, ROBOT-ASSISTED, THORACOSCOPIC (Right) BLOCK, NERVE, INTERCOSTAL (Right)  SURGEON:  Surgeons and Role:    * Shyrl Linnie KIDD, MD - Primary  PHYSICIAN ASSISTANT: Con Bend PA-C  ASSISTANTS: none   ANESTHESIA:   local and general  EBL:  Minimal   BLOOD ADMINISTERED:none  DRAINS: right pleural drain   LOCAL MEDICATIONS USED:  OTHER exparel   SPECIMEN:  Source of Specimen:  Right upper lobe wedge, lymph nodes  DISPOSITION OF SPECIMEN:  PATHOLOGY  COUNTS:  YES  TOURNIQUET:  * No tourniquets in log *  DICTATION: .Dragon Dictation  PLAN OF CARE: Admit to inpatient   PATIENT DISPOSITION:  PACU - hemodynamically stable.   Delay start of Pharmacological VTE agent (>24hrs) due to surgical blood loss or risk of bleeding: no

## 2024-03-24 NOTE — Anesthesia Postprocedure Evaluation (Signed)
 Anesthesia Post Note  Patient: Dorothy Rojas  Procedure(s) Performed: VIDEO BRONCHOSCOPY WITH ENDOBRONCHIAL NAVIGATION WEDGE RESECTION, LUNG, ROBOT-ASSISTED, THORACOSCOPIC (Right: Chest) BLOCK, NERVE, INTERCOSTAL (Right: Chest)     Patient location during evaluation: PACU Anesthesia Type: General Level of consciousness: awake and alert Pain management: pain level controlled Vital Signs Assessment: post-procedure vital signs reviewed and stable Respiratory status: spontaneous breathing, nonlabored ventilation, respiratory function stable and patient connected to nasal cannula oxygen Cardiovascular status: blood pressure returned to baseline and stable Postop Assessment: no apparent nausea or vomiting Anesthetic complications: no   No notable events documented.  Last Vitals:  Vitals:   03/24/24 1400 03/24/24 1500  BP: 131/64 127/60  Pulse: 69 72  Resp: 14 18  Temp: 36.6 C   SpO2: 97% 94%    Last Pain:  Vitals:   03/24/24 1637  TempSrc:   PainSc: 10-Worst pain ever                 Epifanio Lamar BRAVO

## 2024-03-24 NOTE — Anesthesia Procedure Notes (Addendum)
 Procedure Name: Intubation Date/Time: 03/24/2024 7:52 AM  Performed by: Mannie Krystal LABOR, CRNAPre-anesthesia Checklist: Patient identified, Emergency Drugs available, Suction available and Patient being monitored Patient Re-evaluated:Patient Re-evaluated prior to induction Oxygen Delivery Method: Circle system utilized Preoxygenation: Pre-oxygenation with 100% oxygen Induction Type: IV induction Ventilation: Mask ventilation without difficulty Laryngoscope Size: Miller and 2 Grade View: Grade II Tube type: Oral Tube size: 8.5 mm Number of attempts: 1 Airway Equipment and Method: Stylet and Oral airway Placement Confirmation: ETT inserted through vocal cords under direct vision, positive ETCO2 and breath sounds checked- equal and bilateral Secured at: 22 cm Tube secured with: Tape Dental Injury: Teeth and Oropharynx as per pre-operative assessment

## 2024-03-24 NOTE — Interval H&P Note (Signed)
 History and Physical Interval Note:  03/24/2024 7:36 AM  Dorothy Rojas  has presented today for surgery, with the diagnosis of lung nodule.  The various methods of treatment have been discussed with the patient and family. After consideration of risks, benefits and other options for treatment, the patient has consented to  Procedure(s) with comments: VIDEO BRONCHOSCOPY WITH ENDOBRONCHIAL NAVIGATION (N/A) - ION MARKING as a surgical intervention.  The patient's history has been reviewed, patient examined, no change in status, stable for surgery.  I have reviewed the patient's chart and labs.  Questions were answered to the patient's satisfaction.     Vanellope Passmore MALVA Rayas

## 2024-03-24 NOTE — Anesthesia Procedure Notes (Signed)
 Procedure Name: Intubation Date/Time: 03/24/2024 8:46 AM  Performed by: Mannie Krystal LABOR, CRNAPre-anesthesia Checklist: Patient identified, Emergency Drugs available, Suction available and Patient being monitored Patient Re-evaluated:Patient Re-evaluated prior to induction Oxygen Delivery Method: Circle system utilized Preoxygenation: Pre-oxygenation with 100% oxygen Induction Type: IV induction Grade View: Grade I Tube type: Oral Endobronchial tube: Double lumen EBT and 37 Fr Number of attempts: 1 Airway Equipment and Method: Stylet Placement Confirmation: ETT inserted through vocal cords under direct vision, positive ETCO2 and breath sounds checked- equal and bilateral Tube secured with: Tape Dental Injury: Teeth and Oropharynx as per pre-operative assessment

## 2024-03-24 NOTE — Op Note (Signed)
      [address]       [name] [address]             [phone]        03/24/2024  Patient:  Dorothy Rojas Pre-Op Dx: right upper lobe pulmonary nodule   Post-op Dx:  right upper lobe NSCLC Procedure: - Robotic assisted right video thoracoscopy - Right upper lobe wedge resection - Mediastinal lymph node sampling - Intercostal nerve block  Surgeon and Role:      * Shyrl Linnie KIDD, MD - Primary  Assistant: B. Raguel, PA-C  An experienced assistant was required given the complexity of this surgery and the standard of surgical care. The assistant was needed for exposure, dissection, suctioning, retraction of delicate tissues and sutures, instrument exchange and for overall help during this procedure.    Anesthesia  general EBL:  25 ml Blood Administration: none Specimen:  right upper lobe wedge, hilar and mediastinal nodes  Drains: 28 F argyle chest tube in right chest Counts: correct   Indications: 78 y.o. female with 1.3 cm RUL pulmonary nodule.  Biopsies from March of this year were nondiagnostic, but it has shown an increase in size since that point.  PFTs from March are acceptable.  The risks and benefits of navigational marking followed by robotic assisted wedge resection were discussed in detail.  The patient is agreeable to proceed.   Findings: Good dye marking.  Normal appearing lymph nodes.  Wedge resection showed NSCLC.  Lymph nodes did not have any metastasis.  Operative Technique: After the risks, benefits and alternatives were thoroughly discussed, the patient was brought to the operative theatre.  Anesthesia was induced, and the patient was then placed in a lateral decubitus position and was prepped and draped in normal sterile fashion.  An appropriate surgical pause was performed, and pre-operative antibiotics were dosed accordingly.  We began by placing our 4 robotic ports in the the 7th intercostal space targeting the hilum of  the lung.  A 12mm assistant port was placed in the 9th intercostal space in the anterior axillary line.  The robot was then docked and all instruments were passed under direct visualization.    A wedge resection of the dye marked portion of the upper lobe was resected and sent off as frozen specimen.  While we waited on the results, lymph nodes were sampled from the mediastinum and hilum.  The wedge resection was positive, but all lymph nodes did not show any metastasis.    The chest was irrigated, and an air leak test was performed.  An intercostal nerve block was performed under direct visualization.  A 28 F chest tube was then placed, and we watch the remaining lobes re-expand.  The skin and soft tissue were closed with absorbable suture    The patient tolerated the procedure without any immediate complications, and was transferred to the PACU in stable condition.  Lynnex Fulp KIDD Shyrl

## 2024-03-24 NOTE — Discharge Summary (Cosign Needed Addendum)
 [address] [place] [address]             [phone]      Physician Discharge Summary  Patient ID: Dorothy Rojas MRN: [id number] DOB/AGE: 1945/11/01 78 y.o.  Admit date: 03/24/2024 Discharge date: 03/25/2024  Admission Diagnoses:  Patient Active Problem List   Diagnosis Date Noted   Occipital neuralgia of left side 03/04/2024   Lung nodule seen on imaging study 02/24/2024   Pulmonary nodule 1 cm or greater in diameter 10/01/2023   Disorder of lumbar spine 10/23/2022   Lumbar stenosis with neurogenic claudication 01/31/2021   Aortic atherosclerosis (HCC) 07/30/2020   Pain in right foot 07/09/2020   Shortness of breath 12/27/2019   Hoarseness 12/27/2019   Bilateral hearing loss 05/03/2019   Seasonal allergic rhinitis 05/03/2019   Tinnitus of right ear 05/03/2019   S/P left TKA 09/21/2017   Osteoarthritis of knee 08/12/2017   Osteoporosis 12/13/2015   Tubular adenoma of colon 06/26/2015   Esophageal reflux 11/01/2014   Impaired fasting glucose 11/01/2014   Constipation 11/01/2014   Advance care planning 11/01/2014   Heterozygous factor V Leiden mutation (HCC) 08/08/2013   OAB (overactive bladder) 04/14/2013   Osteopenia 04/14/2013   Multinodular goiter 10/13/2012   Essential hypertension, benign 04/09/2011   Pure hypercholesterolemia 04/09/2011   Discharge Diagnoses:   Patient Active Problem List   Diagnosis Date Noted   Status post robot-assisted surgical procedure 03/24/2024   S/P partial lobectomy of lung 03/24/2024   Occipital neuralgia of left side 03/04/2024   Lung nodule seen on imaging study 02/24/2024   Pulmonary nodule 1 cm or greater in diameter 10/01/2023   Disorder of lumbar spine 10/23/2022   Lumbar stenosis with neurogenic claudication 01/31/2021   Aortic atherosclerosis (HCC) 07/30/2020   Pain in right foot 07/09/2020   Shortness of breath 12/27/2019   Hoarseness 12/27/2019   Bilateral hearing loss 05/03/2019    Seasonal allergic rhinitis 05/03/2019   Tinnitus of right ear 05/03/2019   S/P left TKA 09/21/2017   Osteoarthritis of knee 08/12/2017   Osteoporosis 12/13/2015   Tubular adenoma of colon 06/26/2015   Esophageal reflux 11/01/2014   Impaired fasting glucose 11/01/2014   Constipation 11/01/2014   Advance care planning 11/01/2014   Heterozygous factor V Leiden mutation (HCC) 08/08/2013   OAB (overactive bladder) 04/14/2013   Osteopenia 04/14/2013   Multinodular goiter 10/13/2012   Essential hypertension, benign 04/09/2011   Pure hypercholesterolemia 04/09/2011   Discharged Condition: good  History of Present Illness:    Dorothy Rojas is a 78 y.o. female who presents for surgical evaluation of a right upper lobe pulmonary nodule.  It was originally biopsied in the spring of this year, but the results were inconclusive.  She subsequently underwent surveillance imaging with chest CT on 02/16/24 which showed interval increase in size of the solid component of the partly ground glass and solid right upper lobe pulmonary nodule suspicious for possible adenocarcinoma given low level uptake on FDG.   Dr. Shyrl reviewed the patient's diagnostic studies and determined she would benefit from surgical intervention. He reviewed the patient's treatment options as well as the risks and benefits of surgery with the patient. Dorothy Rojas was agreeable to proceed with surgery.  Hospital Course: Dorothy Rojas presented to [facility] and was brought to the operating room on 03/24/24. She underwent robotic assisted right upper lobe wedge resection, she tolerated the procedure well and was  transferred to the PACU in stable condition.  The patient did well post operatively.  Chest tube output remained low.  Chest xray did not show evidence of pneumothorax.  She did exhibit and air leak on POD #1.  However, this occurred only with a cough and Dr. Shyrl felt her chest tube could be removed.  Post  chest tube removal chest xray remained free from significant pneumothorax  She did experience some mild nausea and we will provide some anti-emetics.  She is ambulating without difficulty.  Her surgical incisions are healing without evidence of infection.  She is stable for discharge home today.    Consults: None  Significant Diagnostic Studies: CLINICAL DATA:  Follow-up lung nodule   EXAM: CT CHEST WITHOUT CONTRAST   TECHNIQUE: Multidetector CT imaging of the chest was performed following the standard protocol without IV contrast.   RADIATION DOSE REDUCTION: This exam was performed according to the departmental dose-optimization program which includes automated exposure control, adjustment of the mA and/or kV according to patient size and/or use of iterative reconstruction technique.   COMPARISON:  October 21, 2023 PET-CT and prior studies   FINDINGS: Cardiovascular: Normal caliber thoracic aorta. No pericardial effusion. Normal heart size.   Mediastinum/Nodes: No lymphadenopathy.   Lungs/Pleura: Interval increase in size of partly solid and ground-glass right upper lobe pulmonary nodule with solid component measuring up to 0.7 cm and the entire ground-glass component measuring up to 1.3 cm. Adjacent metallic fiducial marker in the right upper lobe. Scattered additional tiny right lung nodules measuring up to 0.3 cm are unchanged from priors.   Upper Abdomen: Unremarkable.   Musculoskeletal: No acute osseous findings.   IMPRESSION: Interval increase in size of solid component of the partly ground-glass and solid right upper lobe pulmonary nodule, suspicious for possible adenocarcinoma given low level uptake on FDG. Recommend tissue biopsy for further evaluation if not already performed.     Electronically Signed   By: Michaeline Blanch M.D.   On: 02/17/2024 10:45   Treatments: surgery: 03/24/2024   Patient:  Dorothy Rojas Pre-Op Dx: right upper lobe pulmonary nodule    Post-op Dx:  right upper lobe NSCLC Procedure: - Robotic assisted right video thoracoscopy - Right upper lobe wedge resection - Mediastinal lymph node sampling - Intercostal nerve block   Surgeon and Role:      * Lightfoot, Linnie KIDD, MD - Primary   Assistant: B. Raguel, PA-C  An experienced assistant was required given the complexity of this surgery and the standard of surgical care. The assistant was needed for exposure, dissection, suctioning, retraction of delicate tissues and sutures, instrument exchange and for overall help during this procedure.    PATHOLOGY:  Intraoperative frozen: adenocarcinoma, final path pending  Discharge Exam: Blood pressure (!) 124/55, pulse 86, temperature 97.9 F (36.6 C), temperature source Oral, resp. rate 20, height 5' 4 (1.626 m), weight 59 kg, SpO2 (!) 88%.   Gen: NAD Heart: RRR Lungs: CTA bilaterally Incisions: C/D/I Abd: soft non tender, + BS  Discharge disposition: 01-Home or Self Care   Allergies as of 03/25/2024       Reactions   Naprosyn [naproxen] Rash        Medication List     TAKE these medications    acetaminophen  500 MG tablet Commonly known as: TYLENOL  Take 1-2 tablets (500-1,000 mg total) by mouth every 6 (six) hours as needed.   albuterol  108 (90 Base) MCG/ACT inhaler Commonly known as: VENTOLIN  HFA Inhale 2 puffs into  the lungs every 4 (four) hours as needed for wheezing or shortness of breath.   amLODipine  2.5 MG tablet Commonly known as: NORVASC  Take 1 tablet (2.5 mg total) by mouth every evening.   ascorbic acid  500 MG tablet Commonly known as: VITAMIN C Take 500 mg by mouth daily.   aspirin  EC 81 MG tablet Take 81 mg by mouth at bedtime. Swallow whole.   atorvastatin  40 MG tablet Commonly known as: LIPITOR Take 1 tablet (40 mg total) by mouth daily.   Calcium  600+D Plus Minerals 600-400 MG-UNIT Tabs Take 1 capsule by mouth daily.   losartan -hydrochlorothiazide  100-12.5 MG  tablet Commonly known as: HYZAAR TAKE ONE TABLET BY MOUTH ONCE DAILY. keep appointment FOR additional refills.   multivitamin with minerals tablet Take 1 tablet by mouth daily.   ondansetron  4 MG tablet Commonly known as: Zofran  Take 1 tablet (4 mg total) by mouth every 8 (eight) hours as needed for nausea or vomiting.   oxyCODONE  5 MG immediate release tablet Commonly known as: Oxy IR/ROXICODONE  Take 1 tablet (5 mg total) by mouth every 4 (four) hours as needed for moderate pain (pain score 4-6).   Prolia  60 MG/ML Sosy injection Generic drug: denosumab  Inject 60 mg into the skin every 6 (six) months.   Synthroid  50 MCG tablet Generic drug: levothyroxine  TAKE 1 EVERY MORNING ON AN EMPTY STOMACH PRIOR TO ANY OTHER MEDICATIONS FOR HYPOTHYROIDISM   Vitamin D  50 MCG (2000 UT) tablet Take 2,000 Units by mouth every other day.        Follow-up Information     Shyrl Linnie KIDD, MD Follow up on 04/01/2024.   Specialty: Cardiothoracic Surgery Why: Appointment is at 10:20 Contact information: [address] [place] [address] [phone]                 Signed: Rocky Shad, PA-C  03/25/2024, 3:44 PM

## 2024-03-24 NOTE — TOC CM/SW Note (Signed)
 Transition of Care [facility]) - Inpatient Brief Assessment   Patient Details  Name: Dorothy Rojas MRN: [id number] Date of Birth: [birth year]  Transition of Care [facility]) CM/SW Contact:    Lauraine FORBES Saa, LCSWA Phone Number: 03/24/2024, 3:47 PM   Clinical Narrative:  3:47 PM Per chart review, patient resides at home with spouse. Patient has a PCP and insurance. Patient does not have SNF or HH history but has a home rolling walker. Patient's preferred pharmacy is [facility]. No TOC needs were identified at this time. TOC will continue to follow and be available to assist.  Transition of Care Asessment: Insurance and Status: Insurance coverage has been reviewed Patient has primary care physician: Yes Home environment has been reviewed: Private Residence Prior level of function:: N/A Prior/Current Home Services: No current home services Social Drivers of Health Review: SDOH reviewed no interventions necessary Readmission risk has been reviewed: Yes (Currently Green 6%) Transition of care needs: no transition of care needs at this time

## 2024-03-24 NOTE — Op Note (Signed)
      [address]       [name] [address]             [phone]                                          03/24/2024 Patient:  Dorothy Rojas Pre-Op Dx: right upper lobe pulmonary nodule   Post-op Dx:  same Procedure: Flexible bronchoscopy Robotic assisted bronchoscopy with marking of right upper lobe pulmonary nodule   Surgeon and Role:      * Adhrit Krenz, Linnie KIDD, MD - Primary   Anesthesia  general EBL:  minimal    Indications: 78 y.o. female with 1.3 cm RUL pulmonary nodule.  Biopsies from March of this year were nondiagnostic, but it has shown an increase in size since that point.  PFTs from March are acceptable.  The risks and benefits of navigational marking followed by robotic assisted wedge resection were discussed in detail.  The patient is agreeable to proceed.  Findings: Normal anatomy  Operative Technique: Prior to the date of the procedure a high-resolution CT scan of the chest was performed. Utilizing ION software program a virtual tracheobronchial tree was generated to allow the creation of distinct navigation pathways to the patient's parenchymal abnormalities.  The patient was brought to the endoscopy suite.  After anesthesia was induced, and time out was performed.  The video fiberoptic bronchoscope was introduced via the endotracheal tube and a general inspection was performed which showed normal right and left lung anatomy Aspiration of the bilateral mainstems was completed to remove any remaining secretions. The robotic catheter was then inserted into patient's endotracheal tube.   Target #1 right upper lobe: The distinct navigation pathways prepared prior to this procedure were then utilized to navigate to patient's lesion identified on CT scan. The robotic catheter was secured into place and the vision probe was withdrawn.  Lesion location was approximated using fluoroscopy.  Local registration and targeting was performed using Cios  three-dimensional imaging. Under fluoroscopic guidance 1 cc of 1: 1 indocyanine green : Methylene blue  was injected into the nodule using a transbronchial needle.  Needle in lesion was confirmed using Cios.    The patient tolerated the procedure without any immediate complications, and was taken to the operating room for resection of the marked lesion.  Onetta Spainhower KIDD Rayas

## 2024-03-24 NOTE — Transfer of Care (Signed)
 Immediate Anesthesia Transfer of Care Note  Patient: Dorothy Rojas  Procedure(s) Performed: VIDEO BRONCHOSCOPY WITH ENDOBRONCHIAL NAVIGATION WEDGE RESECTION, LUNG, ROBOT-ASSISTED, THORACOSCOPIC (Right: Chest) BLOCK, NERVE, INTERCOSTAL (Right: Chest)  Patient Location: PACU  Anesthesia Type:General  Level of Consciousness: awake, alert , and oriented  Airway & Oxygen Therapy: Patient Spontanous Breathing  Post-op Assessment: Report given to RN and Post -op Vital signs reviewed and stable  Post vital signs: Reviewed and stable  Last Vitals:  Vitals Value Taken Time  BP 114/76 03/24/24 11:19  Temp    Pulse 59 03/24/24 11:22  Resp 10 03/24/24 11:22  SpO2 98 % 03/24/24 11:22  Vitals shown include unfiled device data.  Last Pain:  Vitals:   03/24/24 9367  TempSrc:   PainSc: 0-No pain         Complications: No notable events documented.

## 2024-03-24 NOTE — Discharge Instructions (Addendum)
 Discharge Instructions:  1. You may shower, please wash incisions daily with soap and water and keep dry.  If you wish to cover wounds with dressing you may do so but please keep clean and change daily.  No tub baths or swimming until incisions have completely healed.  If your incisions become red or develop any drainage please call our office at [phone]  2. No Driving until cleared by Dr. Lang office and you are no longer using narcotic pain medications  3. Fever of 101.5 for at least 24 hours with no source, please contact our office at [phone]  4. Activity- up as tolerated, please walk at least 3 times per day.  Avoid strenuous activity until cleared by Dr. Lang office.  5. If any questions or concerns arise, please do not hesitate to contact our office at [phone]

## 2024-03-24 NOTE — Hospital Course (Addendum)
 History of Present Illness:    Dorothy Rojas is a 78 y.o. female who presents for surgical evaluation of a right upper lobe pulmonary nodule.  It was originally biopsied in the spring of this year, but the results were inconclusive.  She subsequently underwent surveillance imaging with chest CT on 02/16/24 which showed interval increase in size of the solid component of the partly ground glass and solid right upper lobe pulmonary nodule suspicious for possible adenocarcinoma given low level uptake on FDG.   Dr. Shyrl reviewed the patient's diagnostic studies and determined she would benefit from surgical intervention. He reviewed the patient's treatment options as well as the risks and benefits of surgery with the patient. Dorothy Rojas was agreeable to proceed with surgery.  Hospital Course: Dorothy Rojas presented to [facility] and was brought to the operating room on 03/24/24. She underwent robotic assisted right upper lobe wedge resection, she tolerated the procedure well and was transferred to the PACU in stable condition.  The patient did well post operatively.  Chest tube output remained low.  Chest xray did not show evidence of pneumothorax.  She did exhibit and air leak on POD #1.  However, this occurred only with a cough and Dr. Shyrl felt her chest tube could be removed.  Post chest tube removal chest xray remained free from significant pneumothorax  She did experience some mild nausea and we will provide some anti-emetics.  She is ambulating without difficulty.  Her surgical incisions are healing without evidence of infection.  She is stable for discharge home today.

## 2024-03-25 ENCOUNTER — Encounter: Payer: Self-pay | Admitting: Family Medicine

## 2024-03-25 ENCOUNTER — Other Ambulatory Visit: Payer: Self-pay | Admitting: *Deleted

## 2024-03-25 ENCOUNTER — Other Ambulatory Visit (HOSPITAL_COMMUNITY): Payer: Self-pay

## 2024-03-25 ENCOUNTER — Telehealth: Payer: Self-pay | Admitting: *Deleted

## 2024-03-25 ENCOUNTER — Encounter (HOSPITAL_COMMUNITY): Payer: Self-pay | Admitting: Thoracic Surgery (Cardiothoracic Vascular Surgery)

## 2024-03-25 ENCOUNTER — Inpatient Hospital Stay (HOSPITAL_COMMUNITY)
Admission: RE | Admit: 2024-03-25 | Source: Ambulatory Visit | Admitting: Thoracic Surgery (Cardiothoracic Vascular Surgery)

## 2024-03-25 ENCOUNTER — Other Ambulatory Visit: Payer: Self-pay | Admitting: Thoracic Surgery (Cardiothoracic Vascular Surgery)

## 2024-03-25 ENCOUNTER — Inpatient Hospital Stay (HOSPITAL_COMMUNITY)

## 2024-03-25 DIAGNOSIS — Z902 Acquired absence of lung [part of]: Secondary | ICD-10-CM

## 2024-03-25 DIAGNOSIS — R911 Solitary pulmonary nodule: Secondary | ICD-10-CM

## 2024-03-25 DIAGNOSIS — R0602 Shortness of breath: Secondary | ICD-10-CM

## 2024-03-25 LAB — BASIC METABOLIC PANEL WITH GFR
Anion gap: 10 (ref 5–15)
BUN: 17 mg/dL (ref 8–23)
CO2: 27 mmol/L (ref 22–32)
Calcium: 9.3 mg/dL (ref 8.9–10.3)
Chloride: 103 mmol/L (ref 98–111)
Creatinine, Ser: 0.95 mg/dL (ref 0.44–1.00)
GFR, Estimated: >60 mL/min (ref >60)
Glucose, Bld: 182 mg/dL — ABNORMAL HIGH (ref 70–99)
Potassium: 4.6 mmol/L (ref 3.5–5.1)
Sodium: 140 mmol/L (ref 135–145)

## 2024-03-25 LAB — CBC
HCT: 32.7 % — ABNORMAL LOW (ref 36.0–46.0)
Hemoglobin: 11.1 g/dL — ABNORMAL LOW (ref 12.0–15.0)
MCH: 33.5 pg (ref 26.0–34.0)
MCHC: 33.9 g/dL (ref 30.0–36.0)
MCV: 98.8 fL (ref 80.0–100.0)
Platelets: 270 K/uL (ref 150–400)
RBC: 3.31 MIL/uL — ABNORMAL LOW (ref 3.87–5.11)
RDW: 12.7 % (ref 11.5–15.5)
WBC: 21.1 K/uL — ABNORMAL HIGH (ref 4.0–10.5)
nRBC: 0 % (ref 0.0–0.2)

## 2024-03-25 MED ORDER — ACETAMINOPHEN 500 MG PO TABS: 500.0000 mg | ORAL_TABLET | Freq: Four times a day (QID) | ORAL | Status: AC | PRN

## 2024-03-25 MED ORDER — OXYCODONE HCL 5 MG PO TABS
5.0000 mg | ORAL_TABLET | ORAL | 0 refills | Status: DC | PRN
  Filled 2024-03-25: qty 30, 5d supply, fill #0

## 2024-03-25 MED ORDER — ONDANSETRON HCL 4 MG PO TABS
4.0000 mg | ORAL_TABLET | Freq: Three times a day (TID) | ORAL | 0 refills | Status: DC | PRN
  Filled 2024-03-25: qty 20, 7d supply, fill #0

## 2024-03-25 NOTE — Telephone Encounter (Signed)
 Contacted patient regarding follow up appt in our PA clinic on Monday. Patient is aware of need for chest x-ray prior to appt. Patient aware to report to ED if she experiences worsening SOB. Patient verbalized understanding.

## 2024-03-25 NOTE — Progress Notes (Addendum)
 [address] [place] [address]             [phone]      1 Day Post-Op Procedure(s) (LRB): WEDGE RESECTION, LUNG, ROBOT-ASSISTED, THORACOSCOPIC (Right) BLOCK, NERVE, INTERCOSTAL (Right) Subjective: Patient sitting up in bed. Patient reports no chest pain, shortness of breath, or dizziness. No complaints or concerns. Patient asked about chest tube removal and was provided patient education and expected timeline   Objective: Vital signs in last 24 hours: Temp:  [97.4 F (36.3 C)-99 F (37.2 C)] 98.2 F (36.8 C) (08/15 0754) Pulse Rate:  [59-79] 79 (08/15 0540) Cardiac Rhythm: Normal sinus rhythm (08/15 0800) Resp:  [10-19] 14 (08/15 0754) BP: (105-138)/(49-72) 125/63 (08/15 0754) SpO2:  [91 %-100 %] 96 % (08/15 0540)     Intake/Output from previous day: 08/14 0701 - 08/15 0700 In: 1300 [I.V.:1000; IV Piggyback:300] Out: 45 [Blood:25; Chest Tube:20] Intake/Output this shift: No intake/output data recorded.  General appearance: alert, cooperative, and no distress Heart: regular rate and rhythm Lungs: clear to auscultation bilaterally. Minor chest tube leak noted on coughing trial.  Abdomen: soft, non-tender; bowel sounds normal; no masses,  no organomegaly Extremities: extremities normal, atraumatic, no cyanosis or edema Wound: Incisions are clear dry and intact. Mild subcutaneous emphysema right lateral flank noted on x ray and appreciated on exam. Non tender.   Lab Results: Recent Labs    03/22/24 1031 03/25/24 0212  WBC 11.7* 21.1*  HGB 12.3 11.1*  HCT 37.1 32.7*  PLT 340 270   BMET:  Recent Labs    03/22/24 1031 03/25/24 0212  NA 140 140  K 3.6 4.6  CL 102 103  CO2 26 27  GLUCOSE 104* 182*  BUN 21 17  CREATININE 0.94 0.95  CALCIUM  9.9 9.3    PT/INR:  Recent Labs    03/22/24 1031  LABPROT 14.1  INR 1.0   ABG    Component Value Date/Time   TCO2 27 12/12/2014 0935   CBG (last 3)  No results for input(s):  GLUCAP in the last 72 hours.  Assessment/Plan: S/P Procedure(s) (LRB): WEDGE RESECTION, LUNG, ROBOT-ASSISTED, THORACOSCOPIC (Right) BLOCK, NERVE, INTERCOSTAL (Right)  CV: NSR Pulm: Lungs clear bilaterally to auscultation. Minor chest tube leak. Some bubbling noted during coughing trial. Some subcutaneous emphysema right lateral flank. Patient doing very well.  GI: Negative BM. Passing some gas. No tenderness to palpation nausea or vomiting.  Dispo: Pending evaluation of chest tube leak.     LOS: 1 day    Robert Plagmann 03/25/2024  Patient overall doing well.  She wants chest tube out. However, I explained the importance of not removing chest tube unless safe for removal to decrease risk of pneumothorax and possible replacement of chest tube.  Gen: NAD Heart: RRR Lungs: CTA bilaterally Incisions: C/D/I Abd: soft non tender, + BS  Chest tube- + Air leak on water  seal   A/P:  Chest tube with air leak, sub q air on CXR... No Definitive pneumothorax, ? Insignificant lateral pneumothorax?... would leave chest tube to water  seal today.SABRA However will defer to Dr. Shyrl   Agree with above PA-Student Robert Plagmann.. changes as documented per my note  Rocky Shad, PA-C 8:53 AM 03/25/24  As discussed with Dr. Shyrl, being air leak only occurred with cough he feels chest tube can be removed.  Will obtain CXR @ 12 if remains stable for discharge home today.  Mirakle Tomlin, PA-C 10:29 AM

## 2024-03-25 NOTE — Progress Notes (Signed)
 Mobility Specialist Progress Note;   03/25/24 1007  Mobility  Activity Ambulated with assistance  Level of Assistance Contact guard assist, steadying assist  Assistive Device None  Distance Ambulated (ft) 250 ft  Activity Response Tolerated well  Mobility Referral Yes  Mobility visit 1 Mobility  Mobility Specialist Start Time (ACUTE ONLY) 1007  Mobility Specialist Stop Time (ACUTE ONLY) 1020  Mobility Specialist Time Calculation (min) (ACUTE ONLY) 13 min   Pt agreeable to mobility. Just got chest tube taken out prior to mobility. Required light MinG assistance during ambulation. HR briefly increased to 130 bpm once returned to room, then quickly recovered to 97 bpm. C/o nausea post ambulation, RN notified. Pt returned to bed and left with all needs met, call bell in reach.  Lauraine Erm Mobility Specialist Please contact via SecureChat or [employer] [phone]

## 2024-03-25 NOTE — Plan of Care (Signed)
  Problem: Education: Goal: Knowledge of General Education information will improve Description: Including pain rating scale, medication(s)/side effects and non-pharmacologic comfort measures Outcome: Adequate for Discharge   Problem: Health Behavior/Discharge Planning: Goal: Ability to manage health-related needs will improve Outcome: Adequate for Discharge   Problem: Clinical Measurements: Goal: Ability to maintain clinical measurements within normal limits will improve Outcome: Adequate for Discharge Goal: Will remain free from infection 03/25/2024 1329 by Charlann Lucie CROME, RN Outcome: Adequate for Discharge 03/25/2024 1114 by Charlann Lucie CROME, RN Outcome: Progressing Goal: Diagnostic test results will improve 03/25/2024 1329 by Charlann Lucie CROME, RN Outcome: Adequate for Discharge 03/25/2024 1114 by Charlann Lucie CROME, RN Outcome: Progressing Goal: Respiratory complications will improve Outcome: Adequate for Discharge Goal: Cardiovascular complication will be avoided Outcome: Adequate for Discharge   Problem: Activity: Goal: Risk for activity intolerance will decrease 03/25/2024 1329 by Charlann Lucie CROME, RN Outcome: Adequate for Discharge 03/25/2024 1114 by Charlann Lucie CROME, RN Outcome: Progressing   Problem: Nutrition: Goal: Adequate nutrition will be maintained 03/25/2024 1329 by Charlann Lucie CROME, RN Outcome: Adequate for Discharge 03/25/2024 1114 by Charlann Lucie CROME, RN Outcome: Progressing   Problem: Coping: Goal: Level of anxiety will decrease 03/25/2024 1329 by Charlann Lucie CROME, RN Outcome: Adequate for Discharge 03/25/2024 1114 by Charlann Lucie CROME, RN Outcome: Progressing   Problem: Elimination: Goal: Will not experience complications related to bowel motility 03/25/2024 1329 by Charlann Lucie CROME, RN Outcome: Adequate for Discharge 03/25/2024 1114 by Charlann Lucie CROME, RN Outcome: Progressing Goal: Will not experience complications related to urinary  retention 03/25/2024 1329 by Charlann Lucie CROME, RN Outcome: Adequate for Discharge 03/25/2024 1114 by Charlann Lucie CROME, RN Outcome: Progressing   Problem: Pain Managment: Goal: General experience of comfort will improve and/or be controlled 03/25/2024 1329 by Charlann Lucie CROME, RN Outcome: Adequate for Discharge 03/25/2024 1114 by Charlann Lucie CROME, RN Outcome: Progressing   Problem: Safety: Goal: Ability to remain free from injury will improve 03/25/2024 1329 by Charlann Lucie CROME, RN Outcome: Adequate for Discharge 03/25/2024 1114 by Charlann Lucie CROME, RN Outcome: Progressing   Problem: Skin Integrity: Goal: Risk for impaired skin integrity will decrease 03/25/2024 1329 by Charlann Lucie CROME, RN Outcome: Adequate for Discharge 03/25/2024 1114 by Charlann Lucie CROME, RN Outcome: Progressing   Problem: Education: Goal: Knowledge of disease or condition will improve Outcome: Adequate for Discharge Goal: Knowledge of the prescribed therapeutic regimen will improve Outcome: Adequate for Discharge   Problem: Activity: Goal: Risk for activity intolerance will decrease Outcome: Adequate for Discharge   Problem: Cardiac: Goal: Will achieve and/or maintain hemodynamic stability Outcome: Adequate for Discharge   Problem: Clinical Measurements: Goal: Postoperative complications will be avoided or minimized Outcome: Adequate for Discharge   Problem: Respiratory: Goal: Respiratory status will improve Outcome: Adequate for Discharge   Problem: Pain Management: Goal: Pain level will decrease Outcome: Adequate for Discharge   Problem: Skin Integrity: Goal: Wound healing without signs and symptoms infection will improve Outcome: Adequate for Discharge

## 2024-03-25 NOTE — Plan of Care (Signed)

## 2024-03-25 NOTE — TOC Transition Note (Signed)
 Transition of Care [facility]) - Discharge Note   Patient Details  Name: Dorothy Rojas MRN: [id number] Date of Birth: 03/03/1946  [facility] CM/SW Contact:  Roxie KANDICE Stain, RN Phone Number: 03/25/2024, 2:04 PM   Clinical Narrative:    Patient stable for discharge.Follow up apt on AVS. No TOC needs at this time.    Final next level of care: Home/Self Care Barriers to Discharge: Barriers Resolved   Patient Goals and CMS Choice Patient states their goals for this hospitalization and ongoing recovery are:: return home          Discharge Placement                 Home      Discharge Plan and Services Additional resources added to the After Visit Summary for                                       Social Drivers of Health (SDOH) Interventions SDOH Screenings   Food Insecurity: No Food Insecurity (03/24/2024)  Housing: Low Risk  (03/24/2024)  Transportation Needs: No Transportation Needs (03/24/2024)  Utilities: Not At Risk (03/24/2024)  Alcohol Screen: Low Risk  (03/16/2024)  Depression (PHQ2-9): Low Risk  (05/04/2023)  Financial Resource Strain: Low Risk  (03/16/2024)  Physical Activity: Sufficiently Active (03/16/2024)  Social Connections: Unknown (03/24/2024)  Stress: No Stress Concern Present (03/16/2024)  Tobacco Use: Medium Risk (03/24/2024)     Readmission Risk Interventions    03/25/2024    2:04 PM  Readmission Risk Prevention Plan  Post Dischage Appt Complete  Medication Screening Complete  Transportation Screening Complete

## 2024-03-25 NOTE — Plan of Care (Signed)
   Problem: Clinical Measurements: Goal: Will remain free from infection Outcome: Progressing Goal: Diagnostic test results will improve Outcome: Progressing   Problem: Activity: Goal: Risk for activity intolerance will decrease Outcome: Progressing   Problem: Nutrition: Goal: Adequate nutrition will be maintained Outcome: Progressing   Problem: Coping: Goal: Level of anxiety will decrease Outcome: Progressing   Problem: Elimination: Goal: Will not experience complications related to bowel motility Outcome: Progressing Goal: Will not experience complications related to urinary retention Outcome: Progressing   Problem: Pain Managment: Goal: General experience of comfort will improve and/or be controlled Outcome: Progressing   Problem: Safety: Goal: Ability to remain free from injury will improve Outcome: Progressing   Problem: Skin Integrity: Goal: Risk for impaired skin integrity will decrease Outcome: Progressing

## 2024-03-26 ENCOUNTER — Telehealth: Payer: Self-pay | Admitting: Physician Assistant

## 2024-03-26 NOTE — Telephone Encounter (Signed)
      [address] [place] [address]             [phone]       Mrs. Correia contact after hours office line around 630 am this morning.  On attempts to return call, phone when straight to voicemail.  She again contacted line around 7:00 and they were able to connect me with patient.   She is S/P Wedge resection who was discharged home yesterday with known small pneumothorax.  She was complaining of pain along her surgery side with inspiration.  She denied shortness of breath.  She denied increase/swelling along chest incision with known previous sub-cutaneous emphysema.  She did mention she had some swelling in her arm where previous IV sites were located.  She had slept through the night with last dose of Tylenol  being at 1230 am.  She also took Oxycodone  on awakening at 6:15 am.  Plan:  I advised patient that increase in pain could be related to sleeping throughout the evening without needing pain medication.  She was advised to take a dose of Advil 400 mg to see if this helped with pain relief as she may have to catch up after the evening.  However we most certainly can not rule out increase in her pneumothorax.  She was instructed that should her pain level not improve after administration of ibuprofen that it would be best for her to present to ED for evaluation as she could have a pneumothorax that is significant in size causing her symptoms.  (Of note she did have an air leak with cough at time of chest tube removal.)   Patient was agreeable to plan.  Rocky Shad, PA-C 8:33 AM 03/26/24

## 2024-03-28 ENCOUNTER — Ambulatory Visit: Payer: Self-pay

## 2024-03-28 ENCOUNTER — Ambulatory Visit (HOSPITAL_COMMUNITY)
Admission: RE | Admit: 2024-03-28 | Discharge: 2024-03-28 | Disposition: A | Discharge status: Home / Self Care | Source: Ambulatory Visit | Attending: Cardiovascular Disease | Admitting: Cardiovascular Disease

## 2024-03-28 VITALS — BP 145/72 | HR 83 | Resp 18 | Ht 64.0 in | Wt 133.0 lb

## 2024-03-28 DIAGNOSIS — Z902 Acquired absence of lung [part of]: Secondary | ICD-10-CM | POA: Insufficient documentation

## 2024-03-28 DIAGNOSIS — J939 Pneumothorax, unspecified: Secondary | ICD-10-CM | POA: Insufficient documentation

## 2024-03-28 DIAGNOSIS — T797XXA Traumatic subcutaneous emphysema, initial encounter: Secondary | ICD-10-CM | POA: Diagnosis not present

## 2024-03-28 DIAGNOSIS — J95811 Postprocedural pneumothorax: Secondary | ICD-10-CM

## 2024-03-28 MED ORDER — PANTOPRAZOLE SODIUM 20 MG PO TBEC
20.0000 mg | DELAYED_RELEASE_TABLET | Freq: Every day | ORAL | 0 refills | Status: DC
Start: 2024-03-28 — End: 2024-05-06

## 2024-03-28 NOTE — Progress Notes (Addendum)
 [address] [name] [address]             [phone]       HPI: Dorothy Rojas is a 78 year old female with medical history of hypertension, aortic atherosclerosis, GERD, osteoporosis, and hypercholesterolemia returns for routine postoperative follow-up having undergone robotic assisted right video thoracoscopy,right upper lobe wedge resection, mediastinal lymph node sampling and intercostal nerve block on 03/24/2024 with Dr. Shyrl.  The patient's early postoperative recovery while in the hospital was unremarkable.  She tolerated the procedure well and her chest tube remained low in output.  She did have a small air leak with coughing. Her chest tube was then removed.  She did have a moderate right pneumothorax once the chest tube was removed.  She was discharged on 03/25/2024.   Since hospital discharge the patient reports that she has been doing well.  She has had some shortness of breath with exertion especially when walking up her stairs. No shortness of breath at rest. The incision sites are sore and she notices them more with coughing, yawning and deep breathing.  She has been using her incentive spirometer consistently throughout the day.  She also has been doing 5 minute walking intervals 6 times a day.  She has had some acid reflux since surgery and needs a refill of pantoprazole .    Current Outpatient Medications  Medication Sig Dispense Refill   acetaminophen  (TYLENOL ) 500 MG tablet Take 1-2 tablets (500-1,000 mg total) by mouth every 6 (six) hours as needed.     albuterol  (VENTOLIN  HFA) 108 (90 Base) MCG/ACT inhaler Inhale 2 puffs into the lungs every 4 (four) hours as needed for wheezing or shortness of breath. 8 g 6   amLODipine  (NORVASC ) 2.5 MG tablet Take 1 tablet (2.5 mg total) by mouth every evening. 90 tablet 2   aspirin  EC 81 MG tablet Take 81 mg by mouth at bedtime. Swallow whole.     atorvastatin  (LIPITOR) 40 MG tablet Take 1  tablet (40 mg total) by mouth daily. 90 tablet 1   Calcium  Carbonate-Vit D-Min (CALCIUM  600+D PLUS MINERALS) 600-400 MG-UNIT TABS Take 1 capsule by mouth daily.     Cholecalciferol  (VITAMIN D ) 50 MCG (2000 UT) tablet Take 2,000 Units by mouth every other day.     denosumab  (PROLIA ) 60 MG/ML SOSY injection Inject 60 mg into the skin every 6 (six) months.     losartan -hydrochlorothiazide  (HYZAAR) 100-12.5 MG tablet TAKE ONE TABLET BY MOUTH ONCE DAILY. keep appointment FOR additional refills. 90 tablet 3   Multiple Vitamins-Minerals (MULTIVITAMIN WITH MINERALS) tablet Take 1 tablet by mouth daily.     ondansetron  (ZOFRAN ) 4 MG tablet Take 1 tablet (4 mg total) by mouth every 8 (eight) hours as needed for nausea or vomiting. 20 tablet 0   oxyCODONE  (OXY IR/ROXICODONE ) 5 MG immediate release tablet Take 1 tablet (5 mg total) by mouth every 4 (four) hours as needed for moderate pain (pain score 4-6). 30 tablet 0   SYNTHROID  50 MCG tablet TAKE 1 EVERY MORNING ON AN EMPTY STOMACH PRIOR TO ANY OTHER MEDICATIONS FOR HYPOTHYROIDISM 90 tablet 1   vitamin C (ASCORBIC ACID ) 500 MG tablet Take 500 mg by mouth daily.     No current facility-administered medications for this visit.   Vitals:   03/28/24 0959  BP: (!) 145/72  Pulse: 83  Resp: 18  SpO2: 96%    Review of Systems  Constitutional:  Negative for fever.  Respiratory:  Positive for shortness of breath. Negative for cough and wheezing.        With exertion  Cardiovascular: Negative.  Negative for chest pain and leg swelling.    Physical Exam Constitutional:      Appearance: Normal appearance.  HENT:     Head: Normocephalic and atraumatic.  Cardiovascular:     Rate and Rhythm: Normal rate and regular rhythm.     Heart sounds: Normal heart sounds, S1 normal and S2 normal.  Pulmonary:     Effort: Pulmonary effort is normal. No tachypnea.     Breath sounds: Normal breath sounds.  Musculoskeletal:     Cervical back: Normal range of motion.   Skin:    General: Skin is warm and dry.      Neurological:     General: No focal deficit present.     Mental Status: She is alert.      Diagnostic Tests: CLINICAL DATA:  s/p lung surgery, pneumo at discharge   EXAM: CHEST - 2 VIEW   COMPARISON:  March 25, 2024   FINDINGS: No focal airspace consolidation or pleural effusion. Small persistent right pneumothorax measuring approximate 9 mm in the apex. no cardiomegaly.No acute fracture or destructive lesion. Extensive subcutaneous emphysema along the right lower neck and chest wall.   IMPRESSION: Small persistent right pneumothorax measuring 9 mm in the apex.     Electronically Signed   By: Rogelia Myers M.D.   On: 03/28/2024 10:27   Plan: S/P partial lobectomy of lung -Continue to keep incision sites clean and dry -Reports some acid reflux post operatively so refill of pantoprazole  sent for her at this time -Continue to exercise with 5 minute walks -Continue to use incentive spirometer   Postprocedural pneumothorax -Pneumothorax has decreased in size from moderate to small since discharge on 03/25/2024.    -Alarm symptoms discussed and she states understanding of when to follow up at the emergency department -Follow up appointment with [facility] on 04/01/2024 with chest xray  Dorothy CHRISTELLA Rough, PA-C [facility] [phone]

## 2024-03-28 NOTE — Patient Instructions (Signed)
-  Follow up appointment on 04/01/2024 with Dr. Lightfoot

## 2024-03-29 ENCOUNTER — Encounter (HOSPITAL_COMMUNITY): Payer: Self-pay | Admitting: Thoracic Surgery (Cardiothoracic Vascular Surgery)

## 2024-03-29 LAB — SURGICAL PATHOLOGY

## 2024-03-30 NOTE — Progress Notes (Unsigned)
      [address]       [place] [address]             [phone]        Dorothy Rojas [facility] Medical Record #[id number] Date of Birth: 08/29/1945  Referring: Randol Dawes, MD Primary Care: Randol Dawes, MD Primary Cardiologist:Jay Ladona, MD  Reason for visit:   follow-up  History of Present Illness:     Dorothy Rojas presents for their first follow-up appointment.  Overall, *** is doing well.  ***  Physical Exam: LMP  (LMP Unknown)   Alert NAD Incision clean.  *** Abdomen, ND no peripheral edema   Diagnostic Studies & Laboratory data:  Path:  FINAL MICROSCOPIC DIAGNOSIS:   A. LUNG, RIGHT UPPER LOBE, WEDGE RESECTION:  - Adenocarcinoma, pT1a pN0 with lepidic component (invasive component  60%, lepidic component 40%)  - All margins are negative for invasive carcinoma  - No lymphovascular or pleural invasion identified  - See oncology table   B. LYMPH NODE, 7, EXCISION:  - One anthracotic lymph node, negative for carcinoma (0/1)   C. LYMPH NODE, 4, EXCISION:  - One anthracotic lymph node, negative for carcinoma (0/1)   D. LYMPH NODE, HILAR, EXCISION:  - One anthracotic lymph node, negative for carcinoma (0/1)     Assessment / Plan:   78 y.o. female s/p Right upper lobe wedge resection for  T1aN0M0 adenocarcinoma.  I have made a referral to medical oncology, along with a 1 month follow-up with a CXR.     Dorothy Rojas 03/30/2024 10:35 AM

## 2024-04-01 ENCOUNTER — Ambulatory Visit
Payer: Self-pay | Attending: Thoracic Surgery (Cardiothoracic Vascular Surgery) | Admitting: Thoracic Surgery (Cardiothoracic Vascular Surgery)

## 2024-04-01 ENCOUNTER — Ambulatory Visit (HOSPITAL_COMMUNITY)
Admission: RE | Admit: 2024-04-01 | Discharge: 2024-04-01 | Disposition: A | Discharge status: Home / Self Care | Source: Ambulatory Visit | Attending: Cardiology | Admitting: Cardiology

## 2024-04-01 VITALS — BP 127/72 | HR 83 | Resp 18 | Ht 64.0 in | Wt 132.0 lb

## 2024-04-01 DIAGNOSIS — R918 Other nonspecific abnormal finding of lung field: Secondary | ICD-10-CM | POA: Diagnosis not present

## 2024-04-01 DIAGNOSIS — R0602 Shortness of breath: Secondary | ICD-10-CM | POA: Diagnosis not present

## 2024-04-01 DIAGNOSIS — R911 Solitary pulmonary nodule: Secondary | ICD-10-CM | POA: Insufficient documentation

## 2024-04-01 DIAGNOSIS — Z9889 Other specified postprocedural states: Secondary | ICD-10-CM | POA: Diagnosis not present

## 2024-04-01 DIAGNOSIS — J95811 Postprocedural pneumothorax: Secondary | ICD-10-CM

## 2024-04-01 DIAGNOSIS — Z902 Acquired absence of lung [part of]: Secondary | ICD-10-CM

## 2024-04-01 DIAGNOSIS — J439 Emphysema, unspecified: Secondary | ICD-10-CM | POA: Diagnosis not present

## 2024-04-07 ENCOUNTER — Other Ambulatory Visit: Payer: Self-pay

## 2024-04-08 ENCOUNTER — Ambulatory Visit: Admitting: Thoracic Surgery (Cardiothoracic Vascular Surgery)

## 2024-04-08 ENCOUNTER — Encounter: Payer: Self-pay | Admitting: Family Medicine

## 2024-04-08 ENCOUNTER — Other Ambulatory Visit: Payer: Self-pay | Admitting: Medical Oncology

## 2024-04-08 DIAGNOSIS — C3490 Malignant neoplasm of unspecified part of unspecified bronchus or lung: Secondary | ICD-10-CM

## 2024-04-08 NOTE — Progress Notes (Signed)
 The proposed treatment discussed in conference is for discussion purpose only and is not a binding recommendation.  The patients have not been physically examined, or presented with their treatment options.  Therefore, final treatment plans cannot be decided.

## 2024-04-12 ENCOUNTER — Inpatient Hospital Stay: Attending: Internal Medicine | Admitting: Internal Medicine

## 2024-04-12 ENCOUNTER — Telehealth: Payer: Self-pay | Admitting: *Deleted

## 2024-04-12 ENCOUNTER — Inpatient Hospital Stay

## 2024-04-12 VITALS — BP 127/52 | HR 72 | Temp 97.2°F | Resp 17 | Ht 64.0 in | Wt 131.0 lb

## 2024-04-12 DIAGNOSIS — Z79899 Other long term (current) drug therapy: Secondary | ICD-10-CM | POA: Diagnosis not present

## 2024-04-12 DIAGNOSIS — C3411 Malignant neoplasm of upper lobe, right bronchus or lung: Secondary | ICD-10-CM | POA: Insufficient documentation

## 2024-04-12 DIAGNOSIS — R0602 Shortness of breath: Secondary | ICD-10-CM | POA: Insufficient documentation

## 2024-04-12 DIAGNOSIS — Z809 Family history of malignant neoplasm, unspecified: Secondary | ICD-10-CM | POA: Insufficient documentation

## 2024-04-12 DIAGNOSIS — C3490 Malignant neoplasm of unspecified part of unspecified bronchus or lung: Secondary | ICD-10-CM

## 2024-04-12 DIAGNOSIS — Z800 Family history of malignant neoplasm of digestive organs: Secondary | ICD-10-CM | POA: Diagnosis not present

## 2024-04-12 DIAGNOSIS — Z85118 Personal history of other malignant neoplasm of bronchus and lung: Secondary | ICD-10-CM | POA: Diagnosis not present

## 2024-04-12 DIAGNOSIS — Z902 Acquired absence of lung [part of]: Secondary | ICD-10-CM | POA: Insufficient documentation

## 2024-04-12 DIAGNOSIS — Z87891 Personal history of nicotine dependence: Secondary | ICD-10-CM | POA: Diagnosis not present

## 2024-04-12 LAB — CBC WITH DIFFERENTIAL (CANCER CENTER ONLY)
Abs Immature Granulocytes: 0.02 K/uL (ref 0.00–0.07)
Basophils Absolute: 0.1 K/uL (ref 0.0–0.1)
Basophils Relative: 1 %
Eosinophils Absolute: 0.7 K/uL — ABNORMAL HIGH (ref 0.0–0.5)
Eosinophils Relative: 7 %
HCT: 33.6 % — ABNORMAL LOW (ref 36.0–46.0)
Hemoglobin: 11.2 g/dL — ABNORMAL LOW (ref 12.0–15.0)
Immature Granulocytes: 0 %
Lymphocytes Relative: 31 %
Lymphs Abs: 2.8 K/uL (ref 0.7–4.0)
MCH: 32.8 pg (ref 26.0–34.0)
MCHC: 33.3 g/dL (ref 30.0–36.0)
MCV: 98.5 fL (ref 80.0–100.0)
Monocytes Absolute: 1 K/uL (ref 0.1–1.0)
Monocytes Relative: 11 %
Neutro Abs: 4.5 K/uL (ref 1.7–7.7)
Neutrophils Relative %: 50 %
Platelet Count: 314 K/uL (ref 150–400)
RBC: 3.41 MIL/uL — ABNORMAL LOW (ref 3.87–5.11)
RDW: 13.2 % (ref 11.5–15.5)
WBC Count: 9.1 K/uL (ref 4.0–10.5)
nRBC: 0 % (ref 0.0–0.2)

## 2024-04-12 LAB — CMP (CANCER CENTER ONLY)
ALT: 16 U/L (ref 0–44)
AST: 15 U/L (ref 15–41)
Albumin: 3.9 g/dL (ref 3.5–5.0)
Alkaline Phosphatase: 66 U/L (ref 38–126)
Anion gap: 7 (ref 5–15)
BUN: 21 mg/dL (ref 8–23)
CO2: 30 mmol/L (ref 22–32)
Calcium: 9.7 mg/dL (ref 8.9–10.3)
Chloride: 103 mmol/L (ref 98–111)
Creatinine: 0.93 mg/dL (ref 0.44–1.00)
GFR, Estimated: >60 mL/min (ref >60)
Glucose, Bld: 124 mg/dL — ABNORMAL HIGH (ref 70–99)
Potassium: 4.2 mmol/L (ref 3.5–5.1)
Sodium: 140 mmol/L (ref 135–145)
Total Bilirubin: 0.4 mg/dL (ref 0.0–1.2)
Total Protein: 6.8 g/dL (ref 6.5–8.1)
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
## 2024-04-12 NOTE — Telephone Encounter (Signed)
 Contacted patient regarding call placed to answering service. Concerns addressed by on call provider. Nothing further at this time.

## 2024-04-13 NOTE — Addendum Note (Signed)
 Addended by: CAROLEE LOA DEL on: 04/13/2024 08:57 AM   Modules accepted: Orders

## 2024-04-21 ENCOUNTER — Ambulatory Visit (INDEPENDENT_AMBULATORY_CARE_PROVIDER_SITE_OTHER)

## 2024-04-21 VITALS — BP 145/68 | HR 89 | Temp 98.1°F | Resp 14 | Ht 64.0 in | Wt 130.4 lb

## 2024-04-21 DIAGNOSIS — M810 Age-related osteoporosis without current pathological fracture: Secondary | ICD-10-CM | POA: Diagnosis not present

## 2024-04-21 MED ORDER — DENOSUMAB 60 MG/ML ~~LOC~~ SOSY
60.0000 mg | PREFILLED_SYRINGE | Freq: Once | SUBCUTANEOUS | Status: AC
  Administered 2024-04-21: 60 mg via SUBCUTANEOUS
  Filled 2024-04-21: qty 1

## 2024-04-21 NOTE — Progress Notes (Signed)
 Diagnosis: Osteoporosis  Provider:  Mannam, Praveen MD  Procedure: Injection  Prolia  (Denosumab ), Dose: 60 mg, Site: subcutaneous, Number of injections: 1  Injection Site(s): Left arm  Post Care: Patient declined observation  Discharge: Condition: Good, Destination: Home . AVS Declined  Performed by:  Lendel Quant, RN

## 2024-04-26 ENCOUNTER — Telehealth: Payer: Self-pay | Admitting: Family Medicine

## 2024-04-26 NOTE — Telephone Encounter (Unsigned)
 Copied from CRM [id number]. Topic: Clinical - Medical Advice >> Apr 26, 2024 12:59 PM Donee H wrote: Reason for CRM: Patient calling to ask which over the counter medication she is able to take. She asking about Tylenol  and Advil. She is stating she once was told she can take one of these but not sure which one. Please follow up with patient [phone]

## 2024-04-26 NOTE — Telephone Encounter (Signed)
 Called patient, this was from last she called and asked.  I'd prefer tylenol  for you, to avoid stomach issues. Your kidney function is fine, so if you need an anti-inflammatory, ibuprofen would be okay for you to take, be sure to take it with food.  You probably should take Nexium  if taking ibuprofen more than just once.

## 2024-05-05 ENCOUNTER — Other Ambulatory Visit: Payer: Self-pay | Admitting: Thoracic Surgery (Cardiothoracic Vascular Surgery)

## 2024-05-05 DIAGNOSIS — R911 Solitary pulmonary nodule: Secondary | ICD-10-CM

## 2024-05-06 ENCOUNTER — Ambulatory Visit (HOSPITAL_COMMUNITY)
Admission: RE | Admit: 2024-05-06 | Discharge: 2024-05-06 | Disposition: A | Discharge status: Home / Self Care | Source: Ambulatory Visit | Attending: Internal Medicine | Admitting: Internal Medicine

## 2024-05-06 ENCOUNTER — Ambulatory Visit: Payer: Self-pay

## 2024-05-06 ENCOUNTER — Other Ambulatory Visit: Payer: Self-pay | Admitting: Cardiology

## 2024-05-06 VITALS — BP 120/67 | Resp 18 | Ht 64.0 in | Wt 128.0 lb

## 2024-05-06 DIAGNOSIS — Z902 Acquired absence of lung [part of]: Secondary | ICD-10-CM

## 2024-05-06 DIAGNOSIS — Z87891 Personal history of nicotine dependence: Secondary | ICD-10-CM | POA: Diagnosis not present

## 2024-05-06 DIAGNOSIS — R911 Solitary pulmonary nodule: Secondary | ICD-10-CM | POA: Insufficient documentation

## 2024-05-06 DIAGNOSIS — Z23 Encounter for immunization: Secondary | ICD-10-CM | POA: Diagnosis not present

## 2024-05-06 DIAGNOSIS — I10 Essential (primary) hypertension: Secondary | ICD-10-CM

## 2024-05-06 DIAGNOSIS — J449 Chronic obstructive pulmonary disease, unspecified: Secondary | ICD-10-CM | POA: Diagnosis not present

## 2024-05-06 MED ORDER — PANTOPRAZOLE SODIUM 40 MG PO TBEC: 40.0000 mg | DELAYED_RELEASE_TABLET | Freq: Every day | ORAL | 1 refills | Status: AC

## 2024-05-06 NOTE — Patient Instructions (Signed)
-  Follow up with oncology as scheduled -Follow up with [facility] as needed   You may continue to gradually increase your physical activity as tolerated.  Refrain from any heavy lifting or strenuous use of your arms and shoulders until at least 8 weeks from the time of your surgery, and avoid activities that cause increased pain in your chest on the side of your surgical incision.  Otherwise you may continue to increase activities without any particular limitations.  Increase the intensity and duration of physical activity gradually.

## 2024-05-06 NOTE — Progress Notes (Signed)
 [address] [place] Rojas [id number]             [phone]       HPI: [name] is a 78 year old female with medical history of hypertension, aortic atherosclerosis, GERD, osteoporosis, and hypercholesterolemia returns for routine postoperative follow-up having undergone robotic assisted right video thoracoscopy,right upper lobe wedge resection, mediastinal lymph node sampling and intercostal nerve block on 03/24/2024 with Dr. Shyrl.  The patient's early postoperative recovery while in the hospital was unremarkable.  She tolerated the procedure well and her chest tube remained low in output.  She did have a small air leak with coughing. Her chest tube was then removed.  She did have a moderate right pneumothorax once the chest tube was removed.  She was discharged on 03/25/2024.   She presents to the clinic today with her husband for continued follow up postoperatively.  She has been doing well.  Her shortness of breath has improved over the past month.  She has been walking for exercise.  Her incision site have healed well but she does not some irritation from her bra strap across them.    Current Outpatient Medications  Medication Sig Dispense Refill   acetaminophen  (TYLENOL ) 500 MG tablet Take 1-2 tablets (500-1,000 mg total) by mouth every 6 (six) hours as needed.     albuterol  (VENTOLIN  HFA) 108 (90 Base) MCG/ACT inhaler Inhale 2 puffs into the lungs every 4 (four) hours as needed for wheezing or shortness of breath. 8 g 6   amLODipine  (NORVASC ) 2.5 MG tablet Take 1 tablet (2.5 mg total) by mouth every evening. 90 tablet 2   aspirin  EC 81 MG tablet Take 81 mg by mouth at bedtime. Swallow whole.     atorvastatin  (LIPITOR) 40 MG tablet Take 1 tablet (40 mg total) by mouth daily. 90 tablet 1   Calcium  Carbonate-Vit D-Min (CALCIUM  600+D PLUS MINERALS) 600-400 MG-UNIT TABS Take 1 capsule by mouth daily.     Cholecalciferol  (VITAMIN D ) 50 MCG (2000 UT)  tablet Take 2,000 Units by mouth every other day.     denosumab  (PROLIA ) 60 MG/ML SOSY injection Inject 60 mg into the skin every 6 (six) months.     losartan -hydrochlorothiazide  (HYZAAR) 100-12.5 MG tablet TAKE ONE TABLET BY MOUTH ONCE DAILY. keep appointment FOR additional refills. 90 tablet 3   Multiple Vitamins-Minerals (MULTIVITAMIN WITH MINERALS) tablet Take 1 tablet by mouth daily.     pantoprazole  (PROTONIX ) 40 MG tablet Take 1 tablet (40 mg total) by mouth daily. 90 tablet 1   SYNTHROID  50 MCG tablet TAKE 1 EVERY MORNING ON AN EMPTY STOMACH PRIOR TO ANY OTHER MEDICATIONS FOR HYPOTHYROIDISM 90 tablet 1   vitamin C (ASCORBIC ACID ) 500 MG tablet Take 500 mg by mouth daily.     oxyCODONE  (OXY IR/ROXICODONE ) 5 MG immediate release tablet Take 1 tablet (5 mg total) by mouth every 4 (four) hours as needed for moderate pain (pain score 4-6). 30 tablet 0   No current facility-administered medications for this visit.   Vitals:   05/06/24 0935  BP: 120/67  Resp: 18  SpO2: 97%    Review of Systems  Constitutional:  Negative for fever and malaise/fatigue.  Respiratory:  Negative for cough and wheezing.        Mild shortness of breath with exertion  Cardiovascular: Negative.  Negative for chest pain and leg swelling.    Physical Exam Constitutional:  Appearance: Normal appearance.  HENT:     Head: Normocephalic and atraumatic.  Cardiovascular:     Rate and Rhythm: Normal rate and regular rhythm.     Heart sounds: Normal heart sounds, S1 normal and S2 normal.  Pulmonary:     Effort: Pulmonary effort is normal. No tachypnea.     Breath sounds: Normal breath sounds.  Musculoskeletal:     Cervical back: Normal range of motion.  Skin:    General: Skin is warm and dry.      Neurological:     General: No focal deficit present.     Mental Status: She is alert.      Diagnostic Tests: CLINICAL DATA:  Follow-up right lung wedge resection for lung cancer. Ex-smoker.    EXAM: CHEST - 2 VIEW   COMPARISON:  04/01/2024 and chest CT dated 02/16/2024.   FINDINGS: Normal-sized heart. Clear lungs. Surgical staple line in the right upper lobe. Stable mild scarring in the lingula. Mildly hyperexpanded lungs with mild peribronchial thickening. Unremarkable bones.   IMPRESSION: 1. No acute abnormality. 2. Mild changes of COPD and chronic bronchitis.     Electronically Signed   By: Dorothy Rojas M.D.   On: 05/06/2024 10:00  Plan: S/P partial lobectomy of lung -We reviewed today's chest x ray. Right sided pneumothorax and surgical emphysema has resolved.   -She is to continue to increase her exercise/activity as tolerated.  -Continue follow up with oncology as scheduled -Reflux as improved with pantoprazole  and increased dose to see if this helps have acid reflux completely resolve -Follow up with [facility] as needed     Dorothy CHRISTELLA Rough, PA-C [facility] [phone]

## 2024-05-15 NOTE — Progress Notes (Unsigned)
 No chief complaint on file.   Dorothy Rojas is a 78 y.o. female who presents for annual wellness visit and follow-up on chronic medical conditions.  See below for labs done prior to visit. She has no complaints today.  Patient was last seen 2 months ago with complaints of abdominal pain.  Urine culture didn't show infection. Felt to be contributed by MSK, and constipation. She reports that her abdominal pain had resolved. ****  UPDATE  Since our last visit she underwent thoracoscopic wedge resection of right upper lobe for persistent pulmonary nodule.  She had a moderate pneumothorax post-operatively which has resolved.  The pathology showed non-small cell lung cancer, with clear margins and negative lymph nodes.  She saw Dr. Sherrod in f/u on 9/2 who stated that surgery is considered curative for this stage 1A cancer, no adjuvant therapy needed.  Plan is oncology visits with scans prior q6 months for 2 years, then yearly for a total of 5 years.  She denies any ongoing problems related to the surgery.   Impaired fasting glucose.  She continues to avoid sugar and limit carbs in her diet.  Last A1c was 6.1% in 10/2023. No longer having dark chocolate candy kisses. Stopped due to LPR. She is drinking Klean wine (very little sugar), up to 3 days/week, just 1 glass.. No other sugary beverages.  Switched to low-acid decaff coffee, no longer has any tea (due to dietary change for LPR). Uses a zero sugar hazelnut creamer.   Hypertension follow-up: She is compliant with losartan -HCT and amlodipine . These are prescribed by Dr. Ladona. BP at home is always fine (130/70-80), not checked often.   BP Readings from Last 3 Encounters:  05/06/24 120/67  04/21/24 (!) 145/68  04/12/24 (!) 127/52     Denies lightheadedness, syncope, headaches. Denies side effects of medications. She denies exertional chest pain.  Denies DOE.    Hyperlipidemia and atherosclerosis:  Patient is compliant with  atorvastatin , denies side effects. She is reportedly following a low-fat, low cholesterol diet.   She was switched from simvastatin  to atorvastatin  (due to potential interaction with amlodipine ), with atorvastatin  dose being increased to 40mg  in May 2024, to help get TG to goal.   She remains very careful with her diet.  Lipids were at goal on last check in 10/2023.  Lab Results  Component Value Date   CHOL 140 10/19/2023   HDL 53 10/19/2023   LDLCALC 62 10/19/2023   TRIG 144 10/19/2023   CHOLHDL 2.6 10/19/2023    She is under the care of Dr. Ladona, and stress test was done earlier this year as part of evaluation of DOE. Stress EKG is positive for myocardial ischemia, very similar in 2021 and nuclear perfusion was completely normal.  He did not suspect significant coronary artery disease. She was also seeing Dr. Shelah.  PFTs were unchanged. He suggested using albuterol  2 puffs prior to exercise, and this has helped.  H/o DVT-- s/p 6 months of anticoagulation with xarelto . She was discharged from the care of hematologist.  If any recurrence, will need lifelong treatment.    She is taking 81mg  of ASA daily. (factor V Leiden heterozygous). No other blood thinning agents were recommended.  Denies any swelling or pain in LE's.  Denies any bleeding/bruising.  Denies any abdominal pain. Denies any bleeding or bruising.    MNG--denies symptoms/dysphagia or changes to her neck.  She had benign biopsies in 2020. Last US  was in 01/2020, no nodules meeting criteria for biopsy,  heterogenous thyroid .  Only has a problem sometimes swallowing the large calcium  pills. She always has a glob in her throat. Diagnosed with LPR.  She is compliant with PPI.  GERD:  Has some reflux related to her diet.  Has had symptoms related to [place] food, [employer]. She used to take Nexium  OTC  prn (hadn't needed).  She now is on omeprazole 40 mg an hour before dinner (taking regularly since diagnosis of LPR). Denies any  heartburn.   Hypothyroidism:  She remains on Synthroid  50mcg. She reports compliance with medication, taking on an empty stomach, separate from other medications.  She denies changes to hair/skin/bowels/energy.  Slight dry skin (chronic).  Lab Results  Component Value Date   TSH 2.320 08/27/2023     Osteoporosis:  She was started on Boniva  in May 2017 (DEXA done 12/10/15 showed T -2.5 at R fem neck). She tolerated it without side effects, but didn't show any improvement (01/2018 T-2.3 at R fem neck, 01/2020 T-2.5 at R fem neck). She was then changed from Boniva  to Prolia  injections, first injection 02/2020, last was 04/21/24. She denies side effects. Last DEXA was 03/2024, T-2.2 L forearm, T-1.9 R fem neck, -1.6 L fem neck, spine excluded.  (Improved at R fem neck from 03/2022, L wrist had been -2.0 then). Vitamin D -OH level was 51.5 in 03/2022.  She takes Calcium  with D once daily.  Gets regular weight-bearing exercise at the gym at least 2x/week.    She has chronic microscopic hematuria and h/o frequent UTI's.  She no longer takes prophylactic antibiotics for UTI's.   She is s/p Macroplastique procedure x 2, last of which was in September 2016. She had worsening incontinence, had PT at [facility], which helped. Hasn't been doing her home exercises. She was discharged from Dr. McDiarmid's care. Now only has leakage with sneezing, coughing.  No leakage with exercising. Sometimes when getting out of recliner. Only occasionally does Kegel's exercises.   Immunization History  Administered Date(s) Administered    sv, Bivalent, Protein Subunit Rsvpref,pf (Abrysvo) 05/14/2022   Fluad Quad(high Dose 65+) 04/12/2019, 04/30/2022   Fluad Trivalent(High Dose 65+) 05/04/2023   INFLUENZA, HIGH DOSE SEASONAL PF 05/08/2014, 05/01/2015, 05/26/2016, 05/22/2017, 06/23/2018, 04/16/2021   Influenza Whole 04/09/2011   Influenza,inj,Quad PF,6+ Mos 04/14/2013   Influenza-Unspecified 05/22/2017, 05/04/2020, 04/16/2021    Moderna SARS-COV2 Booster Vaccination 05/14/2022   PFIZER(Purple Top)SARS-COV-2 Vaccination 09/15/2019, 10/10/2019, 05/05/2020, 11/17/2020   Pfizer Covid-19 Vaccine Bivalent Booster 10yrs & up 07/08/2021   Pfizer(Comirnaty)Fall Seasonal Vaccine 12 years and older 05/04/2023, 11/04/2023   Pneumococcal Conjugate-13 11/01/2014, 05/11/2018   Pneumococcal Polysaccharide-23 04/09/2011, 03/25/2021   Td 08/07/2006   Tdap 11/01/2014   Zoster Recombinant(Shingrix) 07/28/2019, 11/03/2019   Zoster, Live 10/25/2008   Last Pap smear: can't recall. S/p hysterectomy for benign reasons   Last mammogram: 03/2023 Last colonoscopy:  09/2020, tubular adenomas.  Recheck due 5 years. Last DEXA: 03/2024 T-1.9 R fem neck (was -2.2 in 2023), -2.2 L wrist (was -2.0 in 2023), T-1.6 L fem neck, spine excluded. Ophtho: yearly Dentist: regularly, with dentist and periodontist every 3-4 months   Exercise:   Uses weights in the [employer] class (2x/week). Uses machines at the gym 2x/week (40 mins).Either walks the track or walks in her neighborhood (30 mins, 5-7 days/week.    Patient Care Team: Randol Dawes, MD as PCP - General (Family Medicine) Ladona Heinz, MD as PCP - Cardiology (Cardiology) Liane Sharyne MATSU, [facility] (Inactive) as Pharmacist (Pharmacist) Wonda Cy BROCKS, RD as Dietitian (  Family Medicine) Dr. Ladona (cardiology) Dr. McDiarmid (urology)--released from his care Dr. Octavia (ophtho)   Dr. Abran (GI) Dr. Pasco and Marvis (dentist)   Dr. Artis (periodontist) Dr. Germaine (orthodontist)--Invisalign Dr. Niki (hematologist)--released from his care Dr. Ernie (ortho-knee) Dr. Melita (ortho--shoulder) Dr. Rolan Molt (dermatologist) Dr. Gillie (neurosurgeon) Dr. Sherrod (oncology) Dr. Shelah (pulmonary) Dr Shyrl (thoracic surgery)  Depression Screening: Flowsheet Row Office Visit from 04/12/2024 in [facility] Of [place]. [facility]  PHQ-2 Total Score 0     Falls screen:      11/16/2023    2:28 PM 05/04/2023    3:17 PM 09/30/2022   10:47 AM 04/30/2022    2:15 PM 04/03/2022    9:35 AM  Fall Risk   Falls in the past year? 0 0 1 1 0  Number falls in past yr:  0 0 0 0  Comment   04/28/22-pickle ball 04/28/22-fell playing pickle ball(foot got stuck on court and she fell).   Injury with Fall?  0 0 0 0  Comment   brusing to head, left eyebrow and mouth facial bruising   Risk for fall due to :  No Fall Risks No Fall Risks History of fall(s) History of fall(s)  Follow up  Falls evaluation completed Falls evaluation completed Falls evaluation completed  Falls evaluation completed      Data saved with a previous flowsheet row definition     Functional Status Survey:           End of Life Discussion:  Patient has a living will and medical power of attorney, scanned    PMH, PSH, SH and FH were reviewed and updated     ROS:The patient denies anorexia, fever, headaches, vision changes, ear pain, sore throat, breast concerns, chest pain, palpitations, syncope, cough, swelling, nausea, vomiting, diarrhea, abdominal pain, melena, hematochezia, hematuria, dysuria, vaginal bleeding, discharge, odor or itch, genital lesions, numbness, tingling, weakness, tremor, suspicious skin lesions, depression, anxiety, abnormal bleeding or enlarged lymph nodes.    Intermittent vertigo, usually only when she sits up quickly from laying down, short-lived. Urinary incontinence stable per HPI. Uses hearing aids. DOE very mild, related to humidity, and with long, steep hills. Unchanged. ***UPDATE  Intermittent hoarseness and throat-clearing, due to LPR +/- allergies. *** Heartburn?  Pain in R index finger (DIP), mainly at night. Occ constipation, mild.    PHYSICAL EXAM:  LMP  (LMP Unknown)   Wt Readings from Last 3 Encounters:  05/06/24 128 lb (58.1 kg)  04/21/24 130 lb 6.4 oz (59.1 kg)  04/12/24 131 lb (59.4 kg)    General Appearance:    Alert, cooperative, no distress,  appears stated age.  Occasional throat-clearing noted during visit ***  Head:    Normocephalic, without obvious abnormality, atraumatic     Eyes:    PERRL, conjunctiva/corneas clear, EOM's intact, fundi benign     Ears:    Normal TM's and external ear canals     Nose:    No drainage or sinus tenderness  Throat:    Normal mucosa, no lesions  Neck:    Supple, no lymphadenopathy; thyroid : mild enlargement, no nodules; no tenderness/nodules; no carotid bruit or JVD     Back:    Spine nontender, no curvature, ROM normal, no CVA tenderness. WHSS midline lower back   Lungs:    Clear to auscultation bilaterally without wheezes, rales or ronchi; respirations unlabored     Chest Wall:  No tenderness or deformity. *** WHSS   Heart:    Regular rate and rhythm, S1 and S2 normal, no murmur, rub or gallop     Breast Exam:    No tenderness, masses, or nipple discharge. Nipples are inverted bilaterally, L>R (chronic, per pt). No axillary lymphadenopathy.     Abdomen:    Soft, non-tender, nondistended, normoactive bowel sounds, no masses, no hepatosplenomegaly     Genitalia:    Normal external genitalia, mild atrophic changes, no lesions. BUS normal. Mild cystocele. Uterus is surgically absent. Adnexa nontender, no masses. ***  Rectal:    Normal sphincter tone. Heme negative stool. No mass ***  Extremities:    No clubbing, cyanosis or edema. WHSS L knee. Some arthritic changes to her DIP's, slight deformity to the R index distal phalanx.   Pulses:    2+ and symmetric all extremities     Skin:    Skin color, texture, turgor normal, no rashes or suspicious lesions.  Many SK's.   Lymph nodes:    Cervical, supraclavicular, inguinal and axillary nodes normal    Neurologic:    Normal strength, sensation and gait; reflexes 2+ and symmetric throughout                       Psych:   Normal mood, affect, hygiene and grooming   ***UPDATE--if throat clearing. Scar on chest ***  update extremities (R index  DIP) Pelvic/rectal--update if not done   She had CBC and c-met through oncology in 04/2024: Lab Results  Component Value Date   WBC 9.1 04/12/2024   HGB 11.2 (L) 04/12/2024   HCT 33.6 (L) 04/12/2024   MCV 98.5 04/12/2024   PLT 314 04/12/2024     Chemistry      Component Value Date/Time   NA 140 04/12/2024 1345   NA 142 08/27/2023 1131   NA 144 01/26/2014 0809   K 4.2 04/12/2024 1345   K 4.6 01/26/2014 0809   CL 103 04/12/2024 1345   CO2 30 04/12/2024 1345   CO2 29 01/26/2014 0809   BUN 21 04/12/2024 1345   BUN 16 08/27/2023 1131   BUN 18.5 01/26/2014 0809   CREATININE 0.93 04/12/2024 1345   CREATININE 0.92 12/04/2016 0713   CREATININE 0.8 01/26/2014 0809      Component Value Date/Time   CALCIUM  9.7 04/12/2024 1345   CALCIUM  9.4 01/26/2014 0809   ALKPHOS 66 04/12/2024 1345   ALKPHOS 57 10/13/2013 0910   AST 15 04/12/2024 1345   AST 21 10/13/2013 0910   ALT 16 04/12/2024 1345   ALT 23 10/13/2013 0910   BILITOT 0.4 04/12/2024 1345   BILITOT 0.42 10/13/2013 0910     (Wasn't fasting, glu 124)   ASSESSMENT/PLAN:  Flu, covid Prevnar-20 (I dont' see that she's had)--can return in 2 weeks or get at next visit in March.  Doesn't need pelvic/rectal if not having complaints (do every 2 years, done last year).  Consider TSH--last was 08/2023, to prevent her from coming back in Jan for labs. Consider another vitamin D --T score a little worse at wrist (better at R hip).  Discussed monthly self breast exams and yearly mammograms; at least 30 minutes of aerobic activity at least 5 days/week and weight-bearing exercise 2x/week; proper sunscreen use reviewed; healthy diet, including goals of calcium  and vitamin D  intake and alcohol recommendations (less than or equal to 1 drink/day) reviewed; regular seatbelt use; changing batteries in smoke detectors.  Immunization recommendations discussed-- continue  yearly high dose flu shots, given today. COVID booster given today.  *** Prevnar-20 is recommended, discussed. *** Tdap will be due in 10/2024, to get from pharmacy. Colonoscopy recommendations reviewed, UTD.  MOST form reviewed, unchanged. Full Code, Full Care   F/u 6 mos for med check. Fasting labs prior--lipids, A1c, fasting glu. CBC and chem might be followed by oncology. To contact us  if having thyroid  symptoms, and TSH can be added (vs done sooner).  ***ENTER FUTURE ORDERS     Medicare Attestation I have personally reviewed: The patient's medical and social history Their use of alcohol, tobacco or illicit drugs Their current medications and supplements The patient's functional ability including ADLs,fall risks, home safety risks, cognitive, and hearing and visual impairment Diet and physical activities Evidence for depression or mood disorders  The patient's weight, height, BMI have been recorded in the chart.  I have made referrals, counseling, and provided education to the patient based on review of the above and I have provided the patient with a written personalized care plan for preventive services.

## 2024-05-15 NOTE — Patient Instructions (Incomplete)
  HEALTH MAINTENANCE RECOMMENDATIONS:  It is recommended that you get at least 30 minutes of aerobic exercise at least 5 days/week (for weight loss, you may need as much as 60-90 minutes). This can be any activity that gets your heart rate up. This can be divided in 10-15 minute intervals if needed, but try and build up your endurance at least once a week.  Weight bearing exercise is also recommended twice weekly.  Eat a healthy diet with lots of vegetables, fruits and fiber.  Colorful foods have a lot of vitamins (ie green vegetables, tomatoes, red peppers, etc).  Limit sweet tea, regular sodas and alcoholic beverages, all of which has a lot of calories and sugar.  Up to 1 alcoholic drink daily may be beneficial for women (unless trying to lose weight, watch sugars).  Drink a lot of water .  Calcium  recommendations are 1200-1500 mg daily (1500 mg for postmenopausal women or women without ovaries), and vitamin D  1000 IU daily.  This should be obtained from diet and/or supplements (vitamins), and calcium  should not be taken all at once, but in divided doses.  Monthly self breast exams and yearly mammograms for women over the age of 63 is recommended.  Sunscreen of at least SPF 30 should be used on all sun-exposed parts of the skin when outside between the hours of 10 am and 4 pm (not just when at beach or pool, but even with exercise, golf, tennis, and yard work!)  Use a sunscreen that says broad spectrum so it covers both UVA and UVB rays, and make sure to reapply every 1-2 hours.  Remember to change the batteries in your smoke detectors when changing your clock times in the spring and fall. Carbon monoxide detectors are recommended for your home.  Use your seat belt every time you are in a car, and please drive safely and not be distracted with cell phones and texting while driving.   Dorothy Rojas , Thank you for taking time to come for your Medicare Wellness Visit. I appreciate your ongoing  commitment to your health goals. Please review the following plan we discussed and let me know if I can assist you in the future.   This is a list of the screening recommended for you and due dates:  Health Maintenance  Topic Date Due   COVID-19 Vaccine (8 - 2025-26 season) 04/11/2024   Flu Shot  11/08/2024*   DTaP/Tdap/Td vaccine (3 - Td or Tdap) 10/31/2024   Breast Cancer Screening  03/16/2025   Medicare Annual Wellness Visit  05/16/2025   Colon Cancer Screening  10/01/2025   Pneumococcal Vaccine for age over 13  Completed   DEXA scan (bone density measurement)  Completed   Hepatitis C Screening  Completed   Zoster (Shingles) Vaccine  Completed   Meningitis B Vaccine  Aged Out  *Topic was postponed. The date shown is not the original due date.   You will be due for your Tdap booster in 10/2024. You will need to get this from the pharmacy.  Prevnar-20 was discussed, recommended.

## 2024-05-16 ENCOUNTER — Ambulatory Visit: Admitting: Family Medicine

## 2024-05-16 ENCOUNTER — Encounter: Payer: Self-pay | Admitting: Family Medicine

## 2024-05-16 VITALS — BP 110/60 | HR 72 | Ht 64.0 in | Wt 131.8 lb

## 2024-05-16 DIAGNOSIS — E559 Vitamin D deficiency, unspecified: Secondary | ICD-10-CM

## 2024-05-16 DIAGNOSIS — Z86718 Personal history of other venous thrombosis and embolism: Secondary | ICD-10-CM

## 2024-05-16 DIAGNOSIS — E039 Hypothyroidism, unspecified: Secondary | ICD-10-CM

## 2024-05-16 DIAGNOSIS — I10 Essential (primary) hypertension: Secondary | ICD-10-CM

## 2024-05-16 DIAGNOSIS — C3411 Malignant neoplasm of upper lobe, right bronchus or lung: Secondary | ICD-10-CM | POA: Diagnosis not present

## 2024-05-16 DIAGNOSIS — Z5181 Encounter for therapeutic drug level monitoring: Secondary | ICD-10-CM

## 2024-05-16 DIAGNOSIS — M810 Age-related osteoporosis without current pathological fracture: Secondary | ICD-10-CM

## 2024-05-16 DIAGNOSIS — D6851 Activated protein C resistance: Secondary | ICD-10-CM | POA: Diagnosis not present

## 2024-05-16 DIAGNOSIS — R911 Solitary pulmonary nodule: Secondary | ICD-10-CM

## 2024-05-16 DIAGNOSIS — Z0000 Encounter for general adult medical examination without abnormal findings: Secondary | ICD-10-CM

## 2024-05-16 DIAGNOSIS — R7301 Impaired fasting glucose: Secondary | ICD-10-CM

## 2024-05-16 DIAGNOSIS — Z23 Encounter for immunization: Secondary | ICD-10-CM

## 2024-05-16 DIAGNOSIS — E782 Mixed hyperlipidemia: Secondary | ICD-10-CM | POA: Diagnosis not present

## 2024-05-16 DIAGNOSIS — I700 Atherosclerosis of aorta: Secondary | ICD-10-CM | POA: Diagnosis not present

## 2024-05-16 LAB — POCT GLYCOSYLATED HEMOGLOBIN (HGB A1C): Hemoglobin A1C: 5.9 % — AB (ref 4.0–5.6)

## 2024-05-17 ENCOUNTER — Ambulatory Visit: Payer: Self-pay | Admitting: Family Medicine

## 2024-05-17 DIAGNOSIS — E039 Hypothyroidism, unspecified: Secondary | ICD-10-CM

## 2024-05-17 LAB — TSH: TSH: 2.69 u[IU]/mL (ref 0.450–4.500)

## 2024-05-17 LAB — VITAMIN D 25 HYDROXY (VIT D DEFICIENCY, FRACTURES): Vit D, 25-Hydroxy: 45.4 ng/mL (ref 30.0–100.0)

## 2024-05-17 MED ORDER — SYNTHROID 50 MCG PO TABS: ORAL_TABLET | ORAL | 3 refills | Status: AC

## 2024-05-19 ENCOUNTER — Ambulatory Visit: Admitting: Family Medicine

## 2024-05-19 ENCOUNTER — Encounter: Payer: Self-pay | Admitting: Family Medicine

## 2024-05-19 VITALS — BP 126/58 | HR 78 | Ht 64.0 in | Wt 130.4 lb

## 2024-05-19 DIAGNOSIS — M25551 Pain in right hip: Secondary | ICD-10-CM

## 2024-05-19 MED ORDER — MELOXICAM 15 MG PO TABS: 15.0000 mg | ORAL_TABLET | Freq: Every day | ORAL | 0 refills | Status: AC

## 2024-05-19 NOTE — Progress Notes (Signed)
   Name: Dorothy Rojas   Date of Visit: 05/19/24   Date of last visit with me: Visit date not found   CHIEF COMPLAINT:  Chief Complaint  Patient presents with   other    Hip pain mostly on rt side        HPI:  Discussed the use of AI scribe software for clinical note transcription with the patient, who gave verbal consent to proceed.  History of Present Illness   Dorothy Rojas is a 78 year old female who presents with right hip pain.  She has been experiencing right hip pain for a couple of months, described as a stabbing sensation or clicking that occurs intermittently while walking. The pain is not constant and does not occur with every step, but rather sporadically. She has not taken any pain medication such as ibuprofen or Tylenol  as the pain is not persistent.  She rates the pain as a ten out of ten when the clicking occurs, although it is not a constant issue. She experiences some pain when sleeping on her side, but not specifically when going up and down stairs.  She has been recuperating for eight weeks following lung surgery and is gradually resuming her previous level of physical activity, including going to the gym. She has not yet returned to her full routine. She is right-leg dominant and has been trying to increase her walking as part of her recovery.  She has a known allergy to naproxen but does not have allergies to other NSAIDs like ibuprofen. Her current medication includes a baby aspirin  taken daily.         OBJECTIVE:       05/16/2024    1:45 PM  Depression screen PHQ 2/9  Decreased Interest 0  Down, Depressed, Hopeless 0  PHQ - 2 Score 0     BP Readings from Last 3 Encounters:  05/19/24 (!) 126/58  05/16/24 110/60  05/06/24 120/67    BP (!) 126/58   Pulse 78   Ht 5' 4 (1.626 m)   Wt 130 lb 6.4 oz (59.1 kg)   LMP  (LMP Unknown)   SpO2 96%   BMI 22.38 kg/m    Physical Exam   MUSCULOSKELETAL: Right hip pain on abduction,  external rotation, and resisted abduction. Right hip range of motion limited. Left hip strength normal on resisted abduction. Right up weak compared to left.      Physical Exam  ASSESSMENT/PLAN:   Assessment & Plan Greater trochanteric pain syndrome of right lower extremity    Assessment and Plan    Right greater trochanteric pain syndrome with right hip abductor muscle weakness, acute on chronic given >3 month hx Intermittent right hip pain with clicking, exacerbated by movement, due to deconditioning and overuse post-lung surgery. Pain is muscular, not osteoarthritic. - Prescribed meloxicam  15 mg once daily with food for 10 days, extendable to 14 days. - Provided daily home exercises for right hip abductor strengthening, with a rest day weekly. - Advised 3 sets per exercise, using body weight resistance, progressing to ankle weights if needed. - Follow-up in 4-6 weeks to assess progress, consider steroid injection if pain persists.  Right hip osteoarthritis Right hip osteoarthritis present but asymptomatic and not causing current pain.         Dwan Hemmelgarn A. Vita MD [facility]

## 2024-05-27 DIAGNOSIS — H903 Sensorineural hearing loss, bilateral: Secondary | ICD-10-CM | POA: Diagnosis not present

## 2024-06-10 ENCOUNTER — Other Ambulatory Visit: Payer: Self-pay | Admitting: Family Medicine

## 2024-06-10 DIAGNOSIS — E782 Mixed hyperlipidemia: Secondary | ICD-10-CM

## 2024-06-10 MED ORDER — ATORVASTATIN CALCIUM 40 MG PO TABS: 40.0000 mg | ORAL_TABLET | Freq: Every day | ORAL | 1 refills | Status: AC

## 2024-06-10 NOTE — Telephone Encounter (Signed)
 Copied from CRM [id number]. Topic: Clinical - Medication Refill >> Jun 10, 2024 10:52 AM Amy B wrote: Medication: atorvastatin  (LIPITOR) 40 MG tablet  Has the patient contacted their pharmacy? Yes (Agent: If no, request that the patient contact the pharmacy for the refill. If patient does not wish to contact the pharmacy document the reason why and proceed with request.) (Agent: If yes, when and what did the pharmacy advise?)  This is the patient's preferred pharmacy:  [id number] - [place], [place] - [address] AT [facility] OF [address] & [facility] [address] [phone]  Is this the correct pharmacy for this prescription? Yes If no, delete pharmacy and type the correct one.   Has the prescription been filled recently? No  Is the patient out of the medication? Yes  Has the patient been seen for an appointment in the last year OR does the patient have an upcoming appointment? Yes  Can we respond through MyChart? Yes  Agent: Please be advised that Rx refills may take up to 3 business days. We ask that you follow-up with your pharmacy.

## 2024-06-23 ENCOUNTER — Ambulatory Visit: Payer: Self-pay

## 2024-06-23 ENCOUNTER — Ambulatory Visit (INDEPENDENT_AMBULATORY_CARE_PROVIDER_SITE_OTHER): Admitting: Family Medicine

## 2024-06-23 VITALS — BP 100/50 | HR 80 | Ht 64.0 in | Wt 130.6 lb

## 2024-06-23 DIAGNOSIS — J029 Acute pharyngitis, unspecified: Secondary | ICD-10-CM

## 2024-06-23 DIAGNOSIS — K219 Gastro-esophageal reflux disease without esophagitis: Secondary | ICD-10-CM | POA: Diagnosis not present

## 2024-06-23 DIAGNOSIS — J302 Other seasonal allergic rhinitis: Secondary | ICD-10-CM

## 2024-06-23 NOTE — Patient Instructions (Signed)
  Your sore throat is likely related to allergies and postnasal drainage (since you are on medication to treat the reflux, and based on your nasal exam and symptoms of sneezing and blowing nose).  I recommend taking an antihistamine such as claritin or zyrtec or allegra (not the D version). You can take these once daily. Flonase is a nasal steroid spray that might work better, but takes 1-2 weeks to see the full effect. If you don't get an adequate response to the antihistamine pill, add in Flonase, and overlap them for at least 2 weeks.  If you have ongoing symptoms of sore throat, you may need to follow up with the ENT.  You can also consider using Mucinex , which might thin out the mucus so that you have less throat-clearing. Continue to drink plenty of water , which also helps.  Sometimes the throat-clearing can become more of a habit. If that's the case, you may want to use some sugar-free lozenges.

## 2024-06-23 NOTE — Telephone Encounter (Signed)
 FYI Only or Action Required?: FYI only for provider: appointment scheduled on 06/23/24.  Patient was last seen in primary care on 05/19/2024 by Vita Morrow, MD.  Called Nurse Triage reporting Sore Throat and Gastroesophageal Reflux.  Symptoms began several weeks ago.  Interventions attempted: Prescription medications: pantoprazole .  Symptoms are: gradually worsening.  Triage Disposition: See Physician Within 24 Hours  Patient/caregiver understands and will follow disposition?: Yes         Copied from CRM [id number]. Topic: Clinical - Red Word Triage >> Jun 23, 2024  8:08 AM Carlyon D wrote: Red Word that prompted transfer to Nurse Triage: pt has Larynex reflex and throat is very painful all day. Reason for Disposition  SEVERE throat pain (e.g., excruciating)  Answer Assessment - Initial Assessment Questions 1. ONSET: When did the throat start hurting? (Hours or days ago)      Worsening x few weeks 2. SEVERITY: How bad is the sore throat? (Scale 1-10; mild, moderate or severe)     moderate 3. STREP EXPOSURE: Has there been any exposure to strep within the past week? If Yes, ask: What type of contact occurred?      N/a 4.  VIRAL SYMPTOMS: Are there any symptoms of a cold, such as a runny nose, cough, hoarse voice or red eyes?      Runny nose, sneezing, frequent throat clearing, cough 5. FEVER: Do you have a fever? If Yes, ask: What is your temperature, how was it measured, and when did it start?     denies 6. PUS ON THE TONSILS: Is there pus on the tonsils in the back of your throat?     denies 7. OTHER SYMPTOMS: Do you have any other symptoms? (e.g., difficulty breathing, headache, rash)     denies 8. PREGNANCY: Is there any chance you are pregnant? When was your last menstrual period?     N/a  Protocols used: Sore Throat-A-AH

## 2024-06-23 NOTE — Progress Notes (Signed)
 Chief Complaint  Patient presents with   Throat pain    Patient has been having throat pain. Has larynx reflux and has been really bad, protonix  is not helping.    She presents with complaint of sore throat. It is sore all the time, over the last couple of months, possibly since her surgery. She thinks it is related to her laryngeal reflux/acid reflux, but isn't improving despite being on PPI. She is clearing her throat frequently throughout the day, which has been going on for a while as well.  Chart reviewed-- She saw Velia Pry at ENT in 06/2023. She had flexible laryngoscopy at that visit, diagnosed with laryngopharyngeal reflux. Recommend a 48-month trial of once daily Omeprazole, 40mg  taken 30 minutes before evening meals.   She has been taking Pantoprazole  40 mg daily (last filled by Manuelita Rough in 05/06/24). She is taking this 1/2 hour prior to breakfast.  She denies heartburn, indigestion.  She reports frequent sneezing, and blowing nose frequently. She is not taking any antihistamines, flonase or mucinex .   PMH, PSH, SH reviewed  Outpatient Encounter Medications as of 06/23/2024  Medication Sig Note   acetaminophen  (TYLENOL ) 500 MG tablet Take 1-2 tablets (500-1,000 mg total) by mouth every 6 (six) hours as needed. 06/23/2024: As needed   albuterol  (VENTOLIN  HFA) 108 (90 Base) MCG/ACT inhaler Inhale 2 puffs into the lungs every 4 (four) hours as needed for wheezing or shortness of breath. 06/23/2024: As needed   amLODipine  (NORVASC ) 2.5 MG tablet Take 1 tablet (2.5 mg total) by mouth every evening.    aspirin  EC 81 MG tablet Take 81 mg by mouth at bedtime. Swallow whole.    atorvastatin  (LIPITOR) 40 MG tablet Take 1 tablet (40 mg total) by mouth daily.    Calcium  Carbonate-Vit D-Min (CALCIUM  600+D PLUS MINERALS) 600-400 MG-UNIT TABS Take 1 capsule by mouth daily.    Cholecalciferol  (VITAMIN D ) 50 MCG (2000 UT) tablet Take 2,000 Units by mouth every other day.     denosumab  (PROLIA ) 60 MG/ML SOSY injection Inject 60 mg into the skin every 6 (six) months.    ibuprofen (ADVIL) 200 MG tablet Take 200 mg by mouth every 6 (six) hours as needed. 06/23/2024: As needed   losartan -hydrochlorothiazide  (HYZAAR) 100-12.5 MG tablet TAKE ONE TABLET BY MOUTH ONCE DAILY. keep appointment FOR additional refills.    Multiple Vitamins-Minerals (MULTIVITAMIN WITH MINERALS) tablet Take 1 tablet by mouth daily.    pantoprazole  (PROTONIX ) 40 MG tablet Take 1 tablet (40 mg total) by mouth daily.    SYNTHROID  50 MCG tablet TAKE 1 EVERY MORNING ON AN EMPTY STOMACH PRIOR TO ANY OTHER MEDICATIONS FOR HYPOTHYROIDISM    vitamin C (ASCORBIC ACID ) 500 MG tablet Take 500 mg by mouth daily.    meloxicam  (MOBIC ) 15 MG tablet Take 1 tablet (15 mg total) by mouth daily. (Patient not taking: Reported on 06/23/2024)    No facility-administered encounter medications on file as of 06/23/2024.   Allergies  Allergen Reactions   Naprosyn [Naproxen] Rash    ROS: no f/c, headaches, dizziness. No chest pain or shortness of breath. +allergies, sore throat and throat-clearing per HPI. No heartburn or indigestion. Bowels are normal. No nausea, vomiting. No easy bleeding, bruising. Hip pain is improving. Has f/u with Dr. Vita soon.   PHYSICAL EXAM:  BP (!) 100/50   Pulse 80   Ht 5' 4 (1.626 m)   Wt 130 lb 9.6 oz (59.2 kg)   LMP  (LMP Unknown)   BMI  22.42 kg/m   Wt Readings from Last 3 Encounters:  06/23/24 130 lb 9.6 oz (59.2 kg)  05/19/24 130 lb 6.4 oz (59.1 kg)  05/16/24 131 lb 12.8 oz (59.8 kg)   Pleasant female, with frequent throat clearing throughout the visit HEENT: PERRL EOMI, conjunctiva and sclera are clear, EOMI. Nasal mucosa is moderately-severely edematous with clear mucus. Sinuses are nontender. OP is clear Neck: no lymphadenopathy or mass Heart: regular rate and rhythm Lungs: clear bilaterally Extremities: no edema Neuro: alert and oriented, cranial nerves grossly  intact, normal gait Psych: normal mood, affect, hygiene and grooming    ASSESSMENT/PLAN:  Seasonal allergic rhinitis, unspecified trigger - Start antihistamine +/- flonase, proper use reviewed. Discussed mucinex , sinus rinses, and lozenges to break the throat-clearing habit  Sore throat - suspect related to PND from allergies, +/- LPR, but that is being adequately treated. no sign of infection  Laryngopharyngeal reflux (LPR) - cont PPI.  to f/u with ENT if she has persistent symptoms despite the recommended treatments  Your sore throat is likely related to allergies and postnasal drainage (since you are on medication to treat the reflux, and based on your nasal exam and symptoms of sneezing and blowing nose).  I recommend taking an antihistamine such as claritin or zyrtec or allegra (not the D version). You can take these once daily. Flonase is a nasal steroid spray that might work better, but takes 1-2 weeks to see the full effect. If you don't get an adequate response to the antihistamine pill, add in Flonase, and overlap them for at least 2 weeks.  If you have ongoing symptoms of sore throat, you may need to follow up with the ENT.  You can also consider using Mucinex , which might thin out the mucus so that you have less throat-clearing. Continue to drink plenty of water , which also helps.  Sometimes the throat-clearing can become more of a habit. If that's the case, you may want to use some sugar-free lozenges.

## 2024-06-28 ENCOUNTER — Encounter: Payer: Self-pay | Admitting: Family Medicine

## 2024-06-28 ENCOUNTER — Ambulatory Visit: Payer: Self-pay | Admitting: Family Medicine

## 2024-06-28 VITALS — BP 126/70 | HR 82 | Wt 129.4 lb

## 2024-06-28 DIAGNOSIS — M25551 Pain in right hip: Secondary | ICD-10-CM | POA: Diagnosis not present

## 2024-06-28 NOTE — Progress Notes (Signed)
   Name: Dorothy Rojas   Date of Visit: 06/28/24   Date of last visit with me: 05/19/2024   CHIEF COMPLAINT:  Chief Complaint  Patient presents with   Follow-up    5 week follow up on hip pain. Much better, gave hip abduction exercises.        HPI:  Discussed the use of AI scribe software for clinical note transcription with the patient, who gave verbal consent to proceed.  History of Present Illness   Dorothy Rojas is a 78 year old female who presents for follow-up of hip pain.  She experiences hip pain infrequently, describing it as occurring 'once in a blue moon'.  The exercises and hip abduction prescribed for her hip pain have been very effective, allowing her to perform them without significant discomfort.  She has access to a gym at [facility], where she can use machines to target her hip muscles. Previously, she was using body weight and ankle weights, but she is now considering increasing the weight to build muscle strength and reduce pain.         OBJECTIVE:       05/16/2024    1:45 PM  Depression screen PHQ 2/9  Decreased Interest 0  Down, Depressed, Hopeless 0  PHQ - 2 Score 0     BP Readings from Last 3 Encounters:  06/28/24 126/70  06/23/24 (!) 100/50  05/19/24 (!) 126/58    BP 126/70   Pulse 82   Wt 129 lb 6.4 oz (58.7 kg)   LMP  (LMP Unknown)   SpO2 98%   BMI 22.21 kg/m    Physical Exam          Physical Exam Constitutional:      Appearance: Normal appearance.  Neurological:     General: No focal deficit present.     Mental Status: She is alert and oriented to person, place, and time. Mental status is at baseline.     ASSESSMENT/PLAN:   Assessment & Plan Greater trochanteric pain syndrome of right lower extremity    Assessment and Plan    Greater trochanteric pain syndrome Intermittent hip pain improved with exercises. Significant improvement. Recommended increasing muscle strength to prevent future episodes.  Injections considered if pain persists despite exercises. - Continue hip abductor strengthening exercises, six sets weekly. - Increase weight during exercises. - Use gym machines targeting abductor muscles. - Consider injection if pain for flare ups without improvement.         Caison Hearn A. Vita MD [facility]

## 2024-07-11 ENCOUNTER — Other Ambulatory Visit

## 2024-07-27 ENCOUNTER — Other Ambulatory Visit: Payer: Self-pay | Admitting: Cardiology

## 2024-07-27 DIAGNOSIS — I10 Essential (primary) hypertension: Secondary | ICD-10-CM

## 2024-07-28 MED ORDER — AMLODIPINE BESYLATE 2.5 MG PO TABS: 2.5000 mg | ORAL_TABLET | Freq: Every evening | ORAL | 1 refills | Status: AC

## 2024-09-05 ENCOUNTER — Telehealth (HOSPITAL_COMMUNITY): Payer: Self-pay | Admitting: Pharmacy Technician

## 2024-09-05 ENCOUNTER — Telehealth (HOSPITAL_COMMUNITY): Payer: Self-pay | Admitting: Pharmacist

## 2024-09-05 NOTE — Telephone Encounter (Signed)
 Patient aware of SOC change for denosumab  to [facility] infusiojn  Therapy changed to [facility] per payer preference  Sherry Pennant, PharmD, MPH, BCPS, CPP Clinical Pharmacist

## 2024-09-05 NOTE — Telephone Encounter (Signed)
 Auth Submission: NO AUTH NEEDED Site of care: [facility] Payer: Medicare A/B, BCBS Supp   Medication & CPT/J Code(s) submitted: Stoboclo (denosumab -bmwo) [id number] Diagnosis Code:  Route of submission (phone, fax, portal):  Phone # Fax # Auth type: Buy/Bill HB Units/visits requested: 60mg  x 2 doses, q 6 months Reference number:  Approval from: 09/05/2024 to 08/10/25    Dagoberto Armour, CPhT [facility] Phone: [phone] 09/05/2024

## 2024-10-03 ENCOUNTER — Inpatient Hospital Stay

## 2024-10-03 ENCOUNTER — Ambulatory Visit (HOSPITAL_COMMUNITY)

## 2024-10-10 ENCOUNTER — Inpatient Hospital Stay: Admitting: Internal Medicine

## 2024-10-20 ENCOUNTER — Ambulatory Visit

## 2024-10-20 ENCOUNTER — Encounter (HOSPITAL_COMMUNITY)

## 2024-11-14 ENCOUNTER — Ambulatory Visit: Payer: Self-pay | Admitting: Family Medicine

## 2025-06-12 ENCOUNTER — Encounter: Payer: Self-pay | Admitting: Family Medicine
